# Patient Record
Sex: Female | Born: 1950 | Race: Black or African American | Hispanic: No | Marital: Married | Smoking: Never smoker
Health system: Southern US, Community
[De-identification: ages and names within clinical notes are randomized; demographics above are authoritative.]

## PROBLEM LIST (undated history)

## (undated) DIAGNOSIS — E11319 Type 2 diabetes mellitus with unspecified diabetic retinopathy without macular edema: Secondary | ICD-10-CM

## (undated) DIAGNOSIS — H269 Unspecified cataract: Secondary | ICD-10-CM

## (undated) DIAGNOSIS — H35039 Hypertensive retinopathy, unspecified eye: Secondary | ICD-10-CM

## (undated) DIAGNOSIS — R251 Tremor, unspecified: Secondary | ICD-10-CM

## (undated) DIAGNOSIS — E785 Hyperlipidemia, unspecified: Secondary | ICD-10-CM

## (undated) DIAGNOSIS — F419 Anxiety disorder, unspecified: Secondary | ICD-10-CM

## (undated) DIAGNOSIS — E119 Type 2 diabetes mellitus without complications: Secondary | ICD-10-CM

## (undated) DIAGNOSIS — I10 Essential (primary) hypertension: Secondary | ICD-10-CM

## (undated) HISTORY — DX: Unspecified cataract: H26.9

## (undated) HISTORY — DX: Hypertensive retinopathy, unspecified eye: H35.039

## (undated) HISTORY — DX: Type 2 diabetes mellitus without complications: E11.9

## (undated) HISTORY — DX: Type 2 diabetes mellitus with unspecified diabetic retinopathy without macular edema: E11.319

## (undated) HISTORY — PX: EYE SURGERY: SHX253

## (undated) HISTORY — DX: Hyperlipidemia, unspecified: E78.5

## (undated) HISTORY — DX: Essential (primary) hypertension: I10

---

## 2007-03-24 ENCOUNTER — Ambulatory Visit (HOSPITAL_COMMUNITY): Admission: RE | Admit: 2007-03-24 | Discharge: 2007-03-24 | Payer: Self-pay | Admitting: Family Medicine

## 2014-06-22 ENCOUNTER — Encounter (INDEPENDENT_AMBULATORY_CARE_PROVIDER_SITE_OTHER): Payer: Self-pay | Admitting: *Deleted

## 2015-05-25 ENCOUNTER — Other Ambulatory Visit (HOSPITAL_COMMUNITY): Payer: Self-pay | Admitting: Family Medicine

## 2015-05-25 DIAGNOSIS — Z1231 Encounter for screening mammogram for malignant neoplasm of breast: Secondary | ICD-10-CM

## 2017-04-17 ENCOUNTER — Encounter: Payer: Self-pay | Admitting: Family Medicine

## 2017-04-17 ENCOUNTER — Ambulatory Visit (INDEPENDENT_AMBULATORY_CARE_PROVIDER_SITE_OTHER): Payer: BLUE CROSS/BLUE SHIELD | Admitting: Family Medicine

## 2017-04-17 VITALS — BP 180/100 | HR 106 | Temp 98.2°F | Resp 16 | Ht 63.0 in | Wt 154.5 lb

## 2017-04-17 DIAGNOSIS — Z79899 Other long term (current) drug therapy: Secondary | ICD-10-CM | POA: Diagnosis not present

## 2017-04-17 DIAGNOSIS — E782 Mixed hyperlipidemia: Secondary | ICD-10-CM | POA: Diagnosis not present

## 2017-04-17 DIAGNOSIS — R251 Tremor, unspecified: Secondary | ICD-10-CM | POA: Diagnosis not present

## 2017-04-17 DIAGNOSIS — I10 Essential (primary) hypertension: Secondary | ICD-10-CM

## 2017-04-17 DIAGNOSIS — E119 Type 2 diabetes mellitus without complications: Secondary | ICD-10-CM | POA: Insufficient documentation

## 2017-04-17 DIAGNOSIS — E785 Hyperlipidemia, unspecified: Secondary | ICD-10-CM | POA: Insufficient documentation

## 2017-04-17 DIAGNOSIS — Z23 Encounter for immunization: Secondary | ICD-10-CM

## 2017-04-17 DIAGNOSIS — R5383 Other fatigue: Secondary | ICD-10-CM

## 2017-04-17 MED ORDER — METFORMIN HCL 500 MG PO TABS: 500.0000 mg | ORAL_TABLET | Freq: Two times a day (BID) | ORAL | 0 refills | Status: DC

## 2017-04-17 MED ORDER — SIMVASTATIN 40 MG PO TABS: 40.0000 mg | ORAL_TABLET | Freq: Every day | ORAL | 0 refills | Status: DC

## 2017-04-17 MED ORDER — HYDROCHLOROTHIAZIDE 25 MG PO TABS: 25.0000 mg | ORAL_TABLET | Freq: Every day | ORAL | 0 refills | Status: DC

## 2017-04-17 NOTE — Progress Notes (Signed)
Patient ID: Carolyn Hunter, female    DOB: [birth year], 66 y.o.   MRN: [id number]  Chief Complaint  Patient presents with  . Diabetes    patient guess at last a1c- roughly 6 months ago, with result of about 5.9  . Hypertension  . Hyperlipidemia  . Medication Refill    Allergies Patient has no allergy information on record.  Subjective:   Carolyn Hunter is a 66 y.o. female who presents to [facility] today.  HPI Here to establish care. Has been out of medications for about three weeks. Reports that even when BP is controlled that has white coat syndrome. Reports that gets very nervous at doctor and BP goes up. Energy is good most of the time but has periods of time where can get tired. . Mood is good. Reports that since was 66 years old has had a tremor and it is fine. Not changed. Needs refill. Reports that colonoscopy is up to date. Needs shots. No lesions on feet.    Diabetes  She presents for her initial diabetic visit. She has type 2 diabetes mellitus. No MedicAlert identification noted. Her disease course has been stable. There are no hypoglycemic associated symptoms. Pertinent negatives for hypoglycemia include no nervousness/anxiousness, speech difficulty or sweats. Pertinent negatives for diabetes include no blurred vision, no foot paresthesias, no foot ulcerations, no polydipsia and no polyphagia. There are no hypoglycemic complications. Symptoms are stable. There are no diabetic complications. Pertinent negatives for diabetic complications include no CVA, heart disease, nephropathy, peripheral neuropathy or retinopathy. Risk factors for coronary artery disease include diabetes mellitus, dyslipidemia, hypertension and post-menopausal. Current diabetic treatment includes oral agent (monotherapy). She is compliant with treatment most of the time (has been out of  medication for 3 weeks. ). Her weight is stable. She is following a generally healthy, diabetic, low  fat/cholesterol and low salt diet. Meal planning includes avoidance of concentrated sweets. She participates in exercise every other day. Her overall blood glucose range is 110-130 mg/dl. An ACE inhibitor/angiotensin II receptor blocker is not being taken. She does not see a podiatrist.Eye exam is not current.  Hypertension  This is a chronic problem. The current episode started more than 1 year ago. The problem has been waxing and waning since onset. The problem is uncontrolled. Pertinent negatives include no blurred vision, palpitations, peripheral edema, shortness of breath or sweats. There are no associated agents to hypertension. Past treatments include diuretics and lifestyle changes. The current treatment provides mild (ran out of medicaitons. ) improvement. There is no history of angina, kidney disease, CAD/MI, CVA, left ventricular hypertrophy or retinopathy. There is no history of chronic renal disease.  Hyperlipidemia  This is a chronic problem. The current episode started more than 1 year ago. The problem is controlled. Recent lipid tests were reviewed and are variable. She has no history of chronic renal disease or obesity. There are no known factors aggravating her hyperlipidemia. Pertinent negatives include no focal sensory loss, leg pain, myalgias or shortness of breath. Current antihyperlipidemic treatment includes diet change, exercise and statins. The current treatment provides moderate improvement of lipids. There are no compliance problems.     Past Medical History:  Diagnosis Date  . Diabetes mellitus without complication (HCC)   . Hyperlipidemia   . Hypertension     Past Surgical History:  Procedure Laterality Date  . CESAREAN SECTION     1988    Family History  Problem Relation Age of Onset  .  Cancer Mother        breast and colon cancer  . Diabetes Brother   . Diabetes Son   . Asthma Father        COPD  . Diabetes Son      Social History   Social History  .  Marital status: Married    Spouse name: N/A  . Number of children: N/A  . Years of education: N/A   Occupational History  . retired    Social History Main Topics  . Smoking status: Never Smoker  . Smokeless tobacco: Never Used  . Alcohol use No  . Drug use: No  . Sexual activity: Yes    Birth control/ protection: Post-menopausal     Comment: 66 years old   Other Topics Concern  . 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 Exercises 30 min a day, four days a week.    Enjoys activities. Does not attend church. Watch television.     No outpatient prescriptions prior to visit.   No facility-administered medications prior to visit.     Review of Systems  Constitutional: Negative for activity change and appetite change.  Eyes: Negative for blurred vision.  Respiratory: Negative for choking, chest tightness and shortness of breath.   Cardiovascular: Negative for palpitations and leg swelling.  Endocrine: Negative for polydipsia and polyphagia.  Musculoskeletal: Negative for myalgias.  Neurological: Negative for speech difficulty.  Psychiatric/Behavioral: The patient is not nervous/anxious.      Objective:   BP (!) 180/100 (BP Location: Left Arm, Patient Position: Sitting, Cuff Size: Normal)   Pulse (!) 106   Temp 98.2 F (36.8 C) (Other (Comment))   Resp 16   Ht 5\' 3"  (1.6 m)   Wt 154 lb 8 oz (70.1 kg)   SpO2 98%   BMI 27.37 kg/m   Physical Exam  Constitutional: She is oriented to person, place, and time. She appears well-developed and well-nourished.  HENT:  Head: Normocephalic and atraumatic.  Eyes: Pupils are equal, round, and reactive to light. EOM are normal.  Neck: Normal range of motion. Neck supple. No JVD present. No thyromegaly present.  Cardiovascular: Normal rate and regular rhythm.   No murmur heard. Pulses:      Dorsalis pedis pulses are 2+ on the right side, and 2+ on the left  side.       Posterior tibial pulses are 2+ on the right side, and 2+ on the left side.  Pulmonary/Chest: Effort normal and breath sounds normal. No respiratory distress. She has no wheezes.  Abdominal: Soft. Bowel sounds are normal. She exhibits no distension.  Musculoskeletal: She exhibits no edema.       Right foot: There is normal range of motion and no deformity.       Left foot: There is normal range of motion and no deformity.  Feet:  Right Foot:  Protective Sensation: 9 sites tested. 9 sites sensed.  Skin Integrity: Negative for ulcer, blister, skin breakdown, erythema, warmth or callus.  Left Foot:  Protective Sensation: 9 sites tested. 9 sites sensed.  Skin Integrity: Negative for ulcer, blister, skin breakdown, erythema, warmth or callus.  Lymphadenopathy:    She has no cervical adenopathy.  Neurological: She is alert and oriented to person, place, and time. She has normal strength. No cranial nerve deficit. Coordination and gait normal.  Mild tremor, resting  Psychiatric: She has a normal mood and affect.  Her speech is normal and behavior is normal.  Vitals reviewed.    Assessment and Plan   1. Need for immunization against influenza  - Flu Vaccine QUAD 36+ mos IM  2. Controlled type 2 diabetes mellitus without complication, without long-term current use of insulin (HCC)  - metFORMIN (GLUCOPHAGE) 500 MG tablet; Take 1 tablet (500 mg total) by mouth 2 (two) times daily with a meal.  Dispense: 180 tablet; Refill: 0 - Hemoglobin A1c - Urine Microalbumin w/creat. ratio - Ambulatory referral to Ophthalmology The patient is asked to make an attempt to improve diet and exercise patterns to aid in medical management of this problem. Patient counseled in detail regarding the risks of medication. Told to call or return to clinic if develop any worrisome signs or symptoms. Patient voiced understanding.    3. HTN, goal below 130/80 Discussed was high. Reports that Is usually  controlled when on medication. Will check BP at home and record and bring cuff into office so that we can verify that it is working. Counseled on way to check BP. Will wait until BP controlled more to determine need for ASA.  - hydrochlorothiazide (HYDRODIURIL) 25 MG tablet; Take 1 tablet (25 mg total) by mouth daily.  Dispense: 90 tablet; Refill: 0 - Basic metabolic panel  4. Mixed hyperlipidemia Patient counseled in detail regarding the risks of medication. Told to call or return to clinic if develop any worrisome signs or symptoms. Patient voiced understanding.  Diet recommendations discussed.  - simvastatin (ZOCOR) 40 MG tablet; Take 1 tablet (40 mg total) by mouth daily.  Dispense: 90 tablet; Refill: 0 - Lipid panel  5. Tremor Chronic. Will check TSH. Counseled if changes or worsens to follow up. Voiced understanding.   6. Fatigue, unspecified type Sporadic. Check labs.  - CBC with Differential/Platelet - TSH  7. High risk medications (not anticoagulants) long-term use  - Hepatic function panel  8. Immunization due  - Pneumococcal conjugate vaccine 13-valent        Aliene Beams, MD 04/17/2017

## 2017-04-17 NOTE — Patient Instructions (Signed)

## 2017-04-18 LAB — CBC WITH DIFFERENTIAL/PLATELET
Basophils Absolute: 51 cells/uL (ref 0–200)
Basophils Relative: 1 %
EOS PCT: 5.9 %
Eosinophils Absolute: 301 cells/uL (ref 15–500)
HCT: 41.1 % (ref 35.0–45.0)
Hemoglobin: 13.8 g/dL (ref 11.7–15.5)
Lymphs Abs: 1341 cells/uL (ref 850–3900)
MCH: 30 pg (ref 27.0–33.0)
MCHC: 33.6 g/dL (ref 32.0–36.0)
MCV: 89.3 fL (ref 80.0–100.0)
MPV: 11.2 fL (ref 7.5–12.5)
Monocytes Relative: 11.5 %
NEUTROS PCT: 55.3 %
Neutro Abs: 2820 cells/uL (ref 1500–7800)
PLATELETS: 224 10*3/uL (ref 140–400)
RBC: 4.6 10*6/uL (ref 3.80–5.10)
RDW: 11.9 % (ref 11.0–15.0)
TOTAL LYMPHOCYTE: 26.3 %
WBC mixed population: 587 cells/uL (ref 200–950)
WBC: 5.1 10*3/uL (ref 3.8–10.8)

## 2017-04-18 LAB — LIPID PANEL
Cholesterol: 349 mg/dL — ABNORMAL HIGH (ref <200)
HDL: 57 mg/dL (ref >50)
LDL Cholesterol (Calc): 250 mg/dL (calc) — ABNORMAL HIGH
Non-HDL Cholesterol (Calc): 292 mg/dL (calc) — ABNORMAL HIGH (ref <130)
TRIGLYCERIDES: 229 mg/dL — AB (ref <150)
Total CHOL/HDL Ratio: 6.1 (calc) — ABNORMAL HIGH (ref <5.0)

## 2017-04-18 LAB — MICROALBUMIN / CREATININE URINE RATIO
Creatinine, Urine: 57 mg/dL (ref 20–320)
MICROALB UR: 2.7 mg/dL
Microalb Creat Ratio: 47 mcg/mg creat — ABNORMAL HIGH (ref <30)

## 2017-04-18 LAB — HEPATIC FUNCTION PANEL
AG Ratio: 1.2 (calc) (ref 1.0–2.5)
ALBUMIN MSPROF: 4.1 g/dL (ref 3.6–5.1)
ALT: 10 U/L (ref 6–29)
AST: 15 U/L (ref 10–35)
Alkaline phosphatase (APISO): 76 U/L (ref 33–130)
BILIRUBIN DIRECT: 0.1 mg/dL (ref 0.0–0.2)
BILIRUBIN TOTAL: 0.5 mg/dL (ref 0.2–1.2)
GLOBULIN: 3.4 g/dL (ref 1.9–3.7)
Indirect Bilirubin: 0.4 mg/dL (calc) (ref 0.2–1.2)
Total Protein: 7.5 g/dL (ref 6.1–8.1)

## 2017-04-18 LAB — BASIC METABOLIC PANEL WITH GFR
BUN: 13 mg/dL (ref 7–25)
CALCIUM: 10.2 mg/dL (ref 8.6–10.4)
CO2: 31 mmol/L (ref 20–32)
CREATININE: 0.7 mg/dL (ref 0.50–0.99)
Chloride: 100 mmol/L (ref 98–110)
GFR, Est African American: 107 mL/min/{1.73_m2} (ref >=60)
GFR, Est Non African American: 92 mL/min/{1.73_m2} (ref >=60)
GLUCOSE: 213 mg/dL — AB (ref 65–99)
Potassium: 4.3 mmol/L (ref 3.5–5.3)
Sodium: 138 mmol/L (ref 135–146)

## 2017-04-18 LAB — HEMOGLOBIN A1C
HEMOGLOBIN A1C: 7.1 %{Hb} — AB (ref <5.7)
MEAN PLASMA GLUCOSE: 157 (calc)
eAG (mmol/L): 8.7 (calc)

## 2017-04-18 LAB — TSH: TSH: 1.85 m[IU]/L (ref 0.40–4.50)

## 2017-04-21 ENCOUNTER — Encounter: Payer: Self-pay | Admitting: Family Medicine

## 2017-04-29 NOTE — Progress Notes (Signed)
Patient ID: Carolyn Hunter, female    DOB: 1951-07-21, 66 y.o.   MRN: [id number]  Chief Complaint  Patient presents with  . Diabetes  . Hypertension  . Hyperlipidemia    Allergies Patient has no allergy information on record.  Subjective:   Carolyn Hunter is a 66 y.o. female who presents to [facility] today.  HPI Here to discuss labs. Reports that has been checking BP at home and it runs 130s/80s. Brings in machine to show me the readings and for me to check the cuff and compare the reading with our machine.  Has been on HCTZ for BP. Does not have any side effects with it. Since was seen here has started back on zocor for cholesterol. Eats a healthy diet and tries to eat fruits, vegetables. Does not eat fried foods. Walks and exercises. Has been taking glucophage for blood sugars. Does not check sugars at home. No hypoglycemic episodes. Vision good. No lesion on feet.     Past Medical History:  Diagnosis Date  . Diabetes mellitus without complication (HCC)   . Hyperlipidemia   . Hypertension     Past Surgical History:  Procedure Laterality Date  . CESAREAN SECTION     1988    Family History  Problem Relation Age of Onset  . Cancer Mother        breast and colon cancer  . Diabetes Brother   . Diabetes Son   . Asthma Father        COPD  . Diabetes Son      Social History   Social History  . Marital status: Married    Spouse name: N/A  . Number of children: N/A  . Years of education: N/A   Occupational History  . retired    Social History Main Topics  . Smoking status: Never Smoker  . Smokeless tobacco: Never Used  . Alcohol use No  . Drug use: No  . Sexual activity: Yes    Birth control/ protection: Post-menopausal     Comment: 66 years old   Other Topics Concern  . 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 Exercises 30 min a day, four days a week.    Enjoys  activities. Does not attend church. Watch television.     Review of Systems  Constitutional: Negative for activity change, appetite change, chills, fatigue, fever and unexpected weight change.  Eyes: Negative for visual disturbance.  Respiratory: Negative for cough, chest tightness and shortness of breath.   Cardiovascular: Negative for chest pain, palpitations and leg swelling.  Gastrointestinal: Negative for abdominal pain, nausea and vomiting.  Endocrine: Negative for polyphagia and polyuria.  Genitourinary: Negative for dysuria, frequency and urgency.  Skin: Negative for rash and wound.  Neurological: Negative for dizziness, syncope and light-headedness.  Hematological: Negative for adenopathy.     Objective:   BP (!) 184/78 (BP Location: Left Arm, Patient Position: Sitting, Cuff Size: Normal)   Pulse 84   Temp (!) 97.4 F (36.3 C) (Other (Comment))   Resp 16   Ht  (1.6 m)   Wt 154 lb 4 oz (70 kg)   SpO2 97%   BMI 27.32 kg/m   Physical Exam  Constitutional: She is oriented to person, place, and time. She appears well-developed and well-nourished. No distress.  HENT:  Head: Normocephalic and atraumatic.  Eyes: Pupils are equal, round, and reactive  to light.  Neck: Normal range of motion. Neck supple. No thyromegaly present.  Cardiovascular: Normal rate, regular rhythm and normal heart sounds.   Pulmonary/Chest: Effort normal and breath sounds normal. No respiratory distress.  Neurological: She is alert and oriented to person, place, and time. No cranial nerve deficit.  Skin: Skin is warm and dry.  Nursing note and vitals reviewed.    Assessment and Plan   1. Controlled type 2 diabetes mellitus without complication, without long-term current use of insulin (HCC) Discussed with patient and will increase the metformin to get A1c less than 7. Patient counseled in detail regarding the risks of medication. Told to call or return to clinic if develop any worrisome  signs or symptoms. Patient voiced understanding.  Diet, exericse, foot care discussed.  Due to elevated microalbumin ratio, will change to ACE inhibitor.  Spent over 25 minutes discussing BP and ACE-inhibitor possible side effects, risk, benefits with patient. Discussed renal protective effects of medication and need for this in diabetic. Agree to call if problem and will check K and Cr at follow up.  - metFORMIN (GLUCOPHAGE) 500 MG tablet; Take 2 pills each morning and 1 pill each evening.  Dispense: 270 tablet; Refill: 0  2. Mixed hyperlipidemia Check FLP in 2 months and liver tests. Mediterranean diet discussed. Discussed goal of LDL less than 70.  - simvastatin (ZOCOR) 40 MG tablet; Take 1 tablet (40 mg total) by mouth at bedtime.  Dispense: 90 tablet; Refill: 1  3. HTN, goal below 130/80 Discontinue the HCTZ. Patient with white coat hypertension. BP at home runs 130s/80s. She brought in her machine today and it was consistent with our machine. Readings at home are good. Need to change BP medication due to compelling indication for ACE-I for renal protection. Patient counseled in detail regarding the risks of medication. Told to call or return to clinic if develop any worrisome signs or symptoms. Patient voiced understanding.  Lifestyle modifications discussed with patient including a diet emphasizing vegetables, fruits, and whole grains. Limiting intake of sodium to less than 2,400 mg per day.  Recommendations discussed include consuming low-fat dairy products, poultry, fish, legumes, non-tropical vegetable oils, and nuts; and limiting intake of sweets, sugar-sweetened beverages, and red meat. Discussed following a plan such as the Dietary Approaches to Stop Hypertension (DASH) diet. Patient to read up on this diet.   - lisinopril (PRINIVIL,ZESTRIL) 10 MG tablet; Take 1 tablet (10 mg total) by mouth daily.  Dispense: 90 tablet; Refill: 0   At follow up. Calculate ASA risk/benefit and 10 year  risk ASCVD. Over 50% of OV spend counseling and coordinate card.  Patient will call with BP readings at home and if elevated will increase the lisinipril prior to follow up.  Return in about 8 weeks (around 07/02/2017) for BP/cholesteorl. Aliene Beams, MD 05/08/2017

## 2017-04-30 ENCOUNTER — Ambulatory Visit: Payer: BLUE CROSS/BLUE SHIELD | Admitting: Family Medicine

## 2017-05-07 ENCOUNTER — Encounter: Payer: Self-pay | Admitting: Family Medicine

## 2017-05-07 ENCOUNTER — Ambulatory Visit (INDEPENDENT_AMBULATORY_CARE_PROVIDER_SITE_OTHER): Payer: BLUE CROSS/BLUE SHIELD | Admitting: Family Medicine

## 2017-05-07 VITALS — BP 184/78 | HR 84 | Temp 97.4°F | Resp 16 | Ht 63.0 in | Wt 154.2 lb

## 2017-05-07 DIAGNOSIS — E119 Type 2 diabetes mellitus without complications: Secondary | ICD-10-CM | POA: Diagnosis not present

## 2017-05-07 DIAGNOSIS — E782 Mixed hyperlipidemia: Secondary | ICD-10-CM | POA: Diagnosis not present

## 2017-05-07 DIAGNOSIS — I10 Essential (primary) hypertension: Secondary | ICD-10-CM

## 2017-05-07 MED ORDER — SIMVASTATIN 40 MG PO TABS: 40.0000 mg | ORAL_TABLET | Freq: Every day | ORAL | 1 refills | Status: DC

## 2017-05-07 MED ORDER — LISINOPRIL 10 MG PO TABS: 10.0000 mg | ORAL_TABLET | Freq: Every day | ORAL | 0 refills | Status: DC

## 2017-05-07 MED ORDER — METFORMIN HCL 500 MG PO TABS: ORAL_TABLET | ORAL | 0 refills | Status: DC

## 2017-06-05 NOTE — Progress Notes (Signed)
[facility] - Clinic Note  06/09/2017     CHIEF COMPLAINT Patient presents for Retina Evaluation and Diabetes   HISTORY OF PRESENT ILLNESS: Carolyn Hunter is a 66 y.o. female who presents to the clinic today for:   HPI    Retina Evaluation  In both eyes.  Associated Symptoms Negative for Flashes, Blind Spot, Floaters, Pain, Distortion, Redness, Trauma, Shoulder/Hip pain, Fatigue, Weight Loss, Jaw Claudication, Glare, Photophobia, Scalp Tenderness and Fever.  Context:  mid-range vision, near vision and distance vision.  Treatments tried include no treatments.  I, the attending physician,  performed the HPI with the patient and updated documentation appropriately.        Comments  Referral of Dr. Charise Killianotter Diabtic eye  Eval. Patient states she has cataracts OU and needs to have eye exam before having them removed.She denies having Floaters, flash, pain. CBG 112 this am last  A1c 7. She denies using eye gtts. Denies taking Vits .     Pt states she was dx with DM x 5 years ago; Pt states DM is stable, reports she is on oral medications; Reports CBG is stable; Pt states that she "very seldom" has CBG over 200; Pt states only other health issues are HTN and chol.      Last edited by Rennis ChrisZamora, Sheli Dorin, MD on 06/09/2017  4:56 PM. (History)      Referring physician: Daisy Lazarotter, Mark, DO [address] [place], [place] [id number]  HISTORICAL INFORMATION:   Selected notes from the MEDICAL RECORD NUMBER Referral from Dr. Fabienne BrunsM. Cotter for DM exam;  Ocular Hx-  PMH- DM; high chol; HTN; Last A1C- 7.1 (9.7.18);    CURRENT MEDICATIONS: No current outpatient prescriptions on file. (Ophthalmic Drugs)   No current facility-administered medications for this visit.  (Ophthalmic Drugs)   Current Outpatient Prescriptions (Other)  Medication Sig  . lisinopril (PRINIVIL,ZESTRIL) 10 MG tablet Take 1 tablet (10 mg total) by mouth daily.  . metFORMIN (GLUCOPHAGE) 500 MG tablet Take 2 pills each  morning and 1 pill each evening.  . simvastatin (ZOCOR) 40 MG tablet Take 1 tablet (40 mg total) by mouth at bedtime.  . hydrochlorothiazide (HYDRODIURIL) 25 MG tablet    No current facility-administered medications for this visit.  (Other)      REVIEW OF SYSTEMS: ROS    Positive for: Neurological, Endocrine, Eyes   Negative for: Constitutional, Gastrointestinal, Skin, Genitourinary, Musculoskeletal, HENT, Cardiovascular, Respiratory, Psychiatric, Allergic/Imm, Heme/Lymph   Last edited by Eldridge ScotKendrick, Glenda, LPN on 60/45/409810/30/2018  1:41 PM. (History)       ALLERGIES No Known Allergies  PAST MEDICAL HISTORY Past Medical History:  Diagnosis Date  . Cataract   . Diabetes mellitus without complication (HCC)   . Hyperlipidemia   . Hypertension    Past Surgical History:  Procedure Laterality Date  . CESAREAN SECTION     1988    FAMILY HISTORY Family History  Problem Relation Age of Onset  . Cancer Mother        breast and colon cancer  . Diabetes Brother   . Diabetes Son   . Asthma Father        COPD  . Diabetes Son     SOCIAL HISTORY Social History  Substance Use Topics  . Smoking status: Never Smoker  . Smokeless tobacco: Never Used  . Alcohol use No         OPHTHALMIC EXAM:  Base Eye Exam    Visual Acuity (Snellen - Linear)  Right Left   Dist cc 20/25 -1 20/40 -2   Dist ph cc NI NI   Correction:  Glasses       Tonometry (Tonopen, 1:57 PM)      Right Left   Pressure 19 19       Pupils      Dark Light Shape React APD   Right 4 3.5 Round 1 None   Left 4 3.5 Round 1 None       Visual Fields (Counting fingers)      Left Right    Full Full       Extraocular Movement      Right Left    Full, Ortho Full, Ortho       Neuro/Psych    Oriented x3:  Yes       Dilation    Both eyes:  1.0% Mydriacyl, 2.5% Phenylephrine @ 1:57 PM        Slit Lamp and Fundus Exam    Slit Lamp Exam      Right Left   Lids/Lashes Dermatochalasis - upper lid,  Meibomian gland dysfunction Dermatochalasis - upper lid, Meibomian gland dysfunction   Conjunctiva/Sclera White and quiet White and quiet   Cornea Mild Arcus Mild Arcus   Anterior Chamber Deep and quiet Deep and quiet   Iris Round and dilated, No NVI Round and dilated, No NVI   Lens 2+ Nuclear sclerosis, 2+ Cortical cataract 2+ Nuclear sclerosis, 2+ Cortical cataract   Vitreous Normal Normal       Fundus Exam      Right Left   Disc Normal, No NVD Normal, No NVD   C/D Ratio 0.4 0.4   Macula blunted foveal reflex, perifoveal exudates, MAs, exudates infro temportal to fovea blunted foveal relfex, exudates temporal to fovea, MAs   Vessels Tortuous, AV crossing changes Tortuous with early veinous beading    Periphery Attached, scattered MAs, DBH, exudates inferior to disc attached, CWS nasally, DBH, exudates        Refraction    Wearing Rx      Sphere Cylinder Axis Add   Right [place] +0.50 142 +2.50   Left -0.75 +0.50 035 +2.50   Age:  7   Type:  PAL       Manifest Refraction (Auto)      Sphere Cylinder Axis Dist VA   Right +0.50 +0.50 165 20/30-2   Left -0.75 +0.50 040 20/30-2          IMAGING AND PROCEDURES  Imaging and Procedures for 06/09/17  OCT, Retina - OU - Both Eyes     Right Eye Quality was good. Central Foveal Thickness: 380. Progression has no prior data. Findings include abnormal foveal contour, intraretinal hyper-reflective material, no SRF, intraretinal fluid.   Left Eye Quality was good. Central Foveal Thickness: 366. Progression has no prior data. Findings include abnormal foveal contour, intraretinal fluid, intraretinal hyper-reflective material, no SRF.   Notes Images taken, stored on drive  Diagnosis / Impression:  DME OU (OD>OS)  Clinical management:  See below  Abbreviations: NFP - Normal foveal profile. CME - cystoid macular edema. PED - pigment epithelial detachment. IRF - intraretinal fluid. SRF - subretinal fluid. EZ - ellipsoid zone. ERM -  epiretinal membrane. ORA - outer retinal atrophy. ORT - outer retinal tubulation. SRHM - subretinal hyper-reflective material       Fluorescein Angiography [place] (Transit OD)     Right Eye Progression has no prior data. Early phase findings include microaneurysm, blockage.  Mid/Late phase findings include microaneurysm, leakage, blockage.   Left Eye Progression has no prior data. Early phase findings include blockage, microaneurysm. Mid/Late phase findings include microaneurysm, leakage, blockage.               ASSESSMENT/PLAN:    ICD-10-CM   1. Moderate nonproliferative diabetic retinopathy of both eyes with macular edema associated with type 2 diabetes mellitus (HCC) E11.3313 OCT, Retina - OU - Both Eyes    Fluorescein Angiography [place] (Transit OD)  2. Hypertensive retinopathy of both eyes H35.033   3. Combined forms of age-related cataract of both eyes H25.813     1. moderate Non-Proliferative diabetic retinopathy, both eyes The incidence, risk factors for progression, natural history and treatment options for diabetic retinopathy  were discussed with patient.  The need for close monitoring of blood glucose, blood pressure, and serum lipids, avoiding cigarette or any type of tobacco, and the need for long term follow up was also discussed with patient. - no NV on exam or fluorescein angiogram - Diabetic macular edema, both eyes  The natural history, pathology, and characteristics of diabetic macular edema discussed with patient.  A generalized discussion of the major clinical trials concerning treatment of diabetic macular edema (ETDRS, DCT, SCORE, RISE / RIDE, and ongoing DRCR net studies) was completed.  This discussion included mention of the various approaches to treating diabetic macular edema (observation, laser photocoagulation, anti-VEGF injections with lucentis / Avastin / Eylea, steroid injections with Kenalog / Ozurdex, and intraocular surgery with vitrectomy).   The goal hemoglobin A1C of 6-7 was discussed, as well as importance of smoking cessation and hypertension control.  Need for ongoing treatment and monitoring were specifically discussed with reference to chronic nature of diabetic macular edema.  - relatively mild DME/cystic changes on OCT and BCVA good at 20/30 OU - discussed findings, prognosis, and possible treatments - recommend monitoring for now - f/u in 2 mos with repeat DFE/OCT  2. Hypertensive retinopathy OU - discussed importance of tight BP control - monitor  3. Nuclear and Cortical Cataracts OU;  - The symptoms of cataract, surgical options, and treatments and risks were discussed with patient. - discussed diagnosis and progression - not yet visually significant - monitor for now   Ophthalmic Meds Ordered this visit:  No orders of the defined types were placed in this encounter.      Return for F/U DME OU.  There are no Patient Instructions on file for this visit.   Explained the diagnoses, plan, and follow up with the patient and they expressed understanding.  Patient expressed understanding of the importance of proper follow up care.   Karie Chimera, M.D., Ph.D. Diseases & Surgery of the Retina and Vitreous [facility] 06/09/17     Abbreviations: M myopia (nearsighted); A astigmatism; H hyperopia (farsighted); P presbyopia; Mrx spectacle prescription;  CTL contact lenses; OD right eye; OS left eye; OU both eyes  XT exotropia; ET esotropia; PEK punctate epithelial keratitis; PEE punctate epithelial erosions; DES dry eye syndrome; MGD meibomian gland dysfunction; ATs artificial tears; PFAT's preservative free artificial tears; NSC nuclear sclerotic cataract; PSC posterior subcapsular cataract; ERM epi-retinal membrane; PVD posterior vitreous detachment; RD retinal detachment; DM diabetes mellitus; DR diabetic retinopathy; NPDR non-proliferative diabetic retinopathy; PDR proliferative  diabetic retinopathy; CSME clinically significant macular edema; DME diabetic macular edema; dbh dot blot hemorrhages; CWS cotton wool spot; POAG primary open angle glaucoma; C/D cup-to-disc ratio; HVF humphrey visual field; GVF goldmann visual field;  OCT optical coherence tomography; IOP intraocular pressure; BRVO Branch retinal vein occlusion; CRVO central retinal vein occlusion; CRAO central retinal artery occlusion; BRAO branch retinal artery occlusion; RT retinal tear; SB scleral buckle; PPV pars plana vitrectomy; VH Vitreous hemorrhage; PRP panretinal laser photocoagulation; IVK intravitreal kenalog; VMT vitreomacular traction; MH Macular hole;  NVD neovascularization of the disc; NVE neovascularization elsewhere; AREDS age related eye disease study; ARMD age related macular degeneration; POAG primary open angle glaucoma; EBMD epithelial/anterior basement membrane dystrophy; ACIOL anterior chamber intraocular lens; IOL intraocular lens; PCIOL posterior chamber intraocular lens; Phaco/IOL phacoemulsification with intraocular lens placement; [place] photorefractive keratectomy; LASIK laser assisted in situ keratomileusis; HTN hypertension; DM diabetes mellitus; COPD chronic obstructive pulmonary disease

## 2017-06-09 ENCOUNTER — Encounter (INDEPENDENT_AMBULATORY_CARE_PROVIDER_SITE_OTHER): Payer: Self-pay | Admitting: Ophthalmology

## 2017-06-09 ENCOUNTER — Ambulatory Visit (INDEPENDENT_AMBULATORY_CARE_PROVIDER_SITE_OTHER): Payer: BLUE CROSS/BLUE SHIELD | Admitting: Ophthalmology

## 2017-06-09 DIAGNOSIS — H25813 Combined forms of age-related cataract, bilateral: Secondary | ICD-10-CM

## 2017-06-09 DIAGNOSIS — E113313 Type 2 diabetes mellitus with moderate nonproliferative diabetic retinopathy with macular edema, bilateral: Secondary | ICD-10-CM | POA: Diagnosis not present

## 2017-06-09 DIAGNOSIS — H35033 Hypertensive retinopathy, bilateral: Secondary | ICD-10-CM | POA: Diagnosis not present

## 2017-06-15 ENCOUNTER — Other Ambulatory Visit: Payer: Self-pay | Admitting: Family Medicine

## 2017-06-15 DIAGNOSIS — E119 Type 2 diabetes mellitus without complications: Secondary | ICD-10-CM

## 2017-06-18 ENCOUNTER — Other Ambulatory Visit: Payer: Self-pay | Admitting: Family Medicine

## 2017-06-18 ENCOUNTER — Telehealth: Payer: Self-pay | Admitting: *Deleted

## 2017-06-18 DIAGNOSIS — E119 Type 2 diabetes mellitus without complications: Secondary | ICD-10-CM

## 2017-06-18 NOTE — Telephone Encounter (Signed)
Medication was e-scribed earlier this morning.

## 2017-06-18 NOTE — Telephone Encounter (Signed)
Patient called stating she is taking metformin 3 tablets daily in a 90 day supply, and she needs a refill.Please advise [phone]

## 2017-06-23 ENCOUNTER — Other Ambulatory Visit: Payer: Self-pay

## 2017-06-23 DIAGNOSIS — E119 Type 2 diabetes mellitus without complications: Secondary | ICD-10-CM

## 2017-06-23 MED ORDER — METFORMIN HCL 500 MG PO TABS: ORAL_TABLET | ORAL | 0 refills | Status: DC

## 2017-06-26 ENCOUNTER — Telehealth: Payer: Self-pay | Admitting: Family Medicine

## 2017-06-26 NOTE — Telephone Encounter (Signed)
Called patient 2x to  Confirm her appt per her request by telephone message. No answer, phone only rang 1 time and hung up. Did this twice.

## 2017-06-30 ENCOUNTER — Telehealth: Payer: Self-pay | Admitting: Family Medicine

## 2017-06-30 NOTE — Telephone Encounter (Signed)
Called patient back twice, rings 1 time then makes a hang up noise, no voicemail, no answer. This has happened more than 1 time

## 2017-07-07 ENCOUNTER — Encounter: Payer: Self-pay | Admitting: Family Medicine

## 2017-07-07 ENCOUNTER — Ambulatory Visit: Payer: BLUE CROSS/BLUE SHIELD | Admitting: Family Medicine

## 2017-07-07 ENCOUNTER — Other Ambulatory Visit: Payer: Self-pay

## 2017-07-07 VITALS — BP 162/90 | HR 97 | Temp 98.3°F | Resp 16 | Ht 63.0 in | Wt 154.5 lb

## 2017-07-07 DIAGNOSIS — Z1159 Encounter for screening for other viral diseases: Secondary | ICD-10-CM

## 2017-07-07 DIAGNOSIS — E782 Mixed hyperlipidemia: Secondary | ICD-10-CM

## 2017-07-07 DIAGNOSIS — Z1231 Encounter for screening mammogram for malignant neoplasm of breast: Secondary | ICD-10-CM

## 2017-07-07 DIAGNOSIS — Z1239 Encounter for other screening for malignant neoplasm of breast: Secondary | ICD-10-CM

## 2017-07-07 DIAGNOSIS — E119 Type 2 diabetes mellitus without complications: Secondary | ICD-10-CM | POA: Diagnosis not present

## 2017-07-07 DIAGNOSIS — I10 Essential (primary) hypertension: Secondary | ICD-10-CM | POA: Diagnosis not present

## 2017-07-07 DIAGNOSIS — Z79899 Other long term (current) drug therapy: Secondary | ICD-10-CM

## 2017-07-07 MED ORDER — ASPIRIN EC 81 MG PO TBEC: 81.0000 mg | DELAYED_RELEASE_TABLET | Freq: Every day | ORAL | Status: DC

## 2017-07-07 MED ORDER — METFORMIN HCL 850 MG PO TABS: 850.0000 mg | ORAL_TABLET | Freq: Two times a day (BID) | ORAL | 1 refills | Status: DC

## 2017-07-07 NOTE — Progress Notes (Signed)
Patient ID: Carolyn Hunter, female    DOB: [birth year], 66 y.o.   MRN: [id number]  Chief Complaint  Patient presents with  . Hypertension  . Follow-up    Allergies Patient has no known allergies.  Subjective:   Carolyn Hunter is a 66 y.o. female who [facility].  HPI Carolyn Hunter presents today for follow-up for her blood pressure.  Brings in her readings from home.  Her reading at home before she came in today was 120/76.  Reports she has been taking lisinopril each day as directed.  Is not been taking the HCTZ.  Denies any side effects with the medication.  Reports that she had initially wanted to be on 2 metformin in the morning and 1 in the evening rather than doing the 875 mg twice a day.  Reports she has changed her mind and would like to switch to the twice a day medication dosing with less number of pills.  Reports she is feeling good.  Eating a healthy diet.  No swelling in her extremities.  No lesions on her feet. Is agreeable and would like to be checked for hepatitis C for screening.  Does not want to be checked for HIV.  Has not had a mammogram in many years. Diabetic foot exam was performed at her last visit.  Reports she is taking good care of her feet.  Has been taking her cholesterol medicine for her elevated LDL.  Denies any myalgias or pain.  Does not check her blood sugars at home.   Diabetes  She has type 2 diabetes mellitus. No MedicAlert identification noted. Her disease course has been stable. There are no hypoglycemic associated symptoms. Pertinent negatives for hypoglycemia include no headaches or nervousness/anxiousness. There are no diabetic associated symptoms. Pertinent negatives for diabetes include no blurred vision, no chest pain, no fatigue, no foot paresthesias, no foot ulcerations, no polydipsia, no polyphagia, no polyuria, no visual change, no weakness and no weight loss. There are no hypoglycemic complications. Symptoms are  stable. There are no diabetic complications. Risk factors for coronary artery disease include dyslipidemia, family history and hypertension. Current diabetic treatment includes oral agent (monotherapy). Her weight is stable. She is following a diabetic, high fiber, generally healthy, low salt and low fat/cholesterol diet. Meal planning includes avoidance of concentrated sweets. She has not had a previous visit with a dietitian. She participates in exercise every other day. An ACE inhibitor/angiotensin II receptor blocker is being taken. She does not see a podiatrist.Eye exam is current.    Past Medical History:  Diagnosis Date  . Cataract   . Diabetes mellitus without complication (HCC)   . Hyperlipidemia   . Hypertension     Past Surgical History:  Procedure Laterality Date  . CESAREAN SECTION     1988    Family History  Problem Relation Age of Onset  . Cancer Mother        breast and colon cancer  . Diabetes Brother   . Diabetes Son   . Asthma Father        COPD  . Diabetes Son      Social History   Socioeconomic History  . Marital status: Married    Spouse name: None  . Number of children: None  . Years of education: None  . Highest education level: None  Social Needs  . Financial resource strain: None  . Food insecurity - worry: None  . Food insecurity - inability:  None  . Transportation needs - medical: None  . Transportation needs - non-medical: None  Occupational History  . Occupation: retired  Tobacco Use  . Smoking status: Never Smoker  . Smokeless tobacco: Never Used  Substance and Sexual Activity  . Alcohol use: No  . Drug use: No  . Sexual activity: Yes    Birth control/protection: Post-menopausal    Comment: 66 years old  Other Topics Concern  . 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 Eats all food groups. Exercises 30 min a day, four days a week.    Enjoys activities. Does not attend church. Watch  television.    Current Outpatient Medications on File Prior to Visit  Medication Sig Dispense Refill  . lisinopril (PRINIVIL,ZESTRIL) 10 MG tablet Take 1 tablet (10 mg total) by mouth daily. 90 tablet 0  . simvastatin (ZOCOR) 40 MG tablet Take 1 tablet (40 mg total) by mouth at bedtime. 90 tablet 1   No current facility-administered medications on file prior to visit.     Review of Systems  Constitutional: Negative for fatigue, fever, unexpected weight change and weight loss.  Eyes: Negative for blurred vision and visual disturbance.  Respiratory: Negative for cough, shortness of breath and wheezing.   Cardiovascular: Negative for chest pain, palpitations and leg swelling.  Gastrointestinal: Negative for nausea.  Endocrine: Negative for polydipsia, polyphagia and polyuria.  Genitourinary: Negative for difficulty urinating and urgency.  Musculoskeletal: Negative for arthralgias and myalgias.  Neurological: Negative for weakness, light-headedness and headaches.  Hematological: Negative for adenopathy. Does not bruise/bleed easily.  Psychiatric/Behavioral: The patient is not nervous/anxious.      Objective:   BP (!) 162/90 (BP Location: Left Arm, Patient Position: Sitting, Cuff Size: Normal)   Pulse 97   Temp 98.3 F (36.8 C) (Other (Comment))   Resp 16   Ht 5\' 3"  (1.6 m)   Wt 154 lb 8 oz (70.1 kg)   SpO2 97%   BMI 27.37 kg/m   Physical Exam  Constitutional: She is oriented to person, place, and time. She appears well-developed and well-nourished. No distress.  HENT:  Head: Normocephalic and atraumatic.  Eyes: Pupils are equal, round, and reactive to light.  Neck: Normal range of motion. Neck supple. No thyromegaly present.  Cardiovascular: Normal rate, regular rhythm and normal heart sounds.  Pulmonary/Chest: Effort normal and breath sounds normal. No respiratory distress.  Neurological: She is alert and oriented to person, place, and time. No cranial nerve deficit.    Skin: Skin is warm and dry.  Nursing note and vitals reviewed.    Assessment and Plan  1. Controlled type 2 diabetes mellitus without complication, without long-term current use of insulin (HCC) We will switch dosing of metformin at this time.  We will plan on checking her A1c in 3 months.  Diabetic foot exam up-to-date.  Eye exam as directed. - metFORMIN (GLUCOPHAGE) 850 MG tablet; Take 1 tablet (850 mg total) by mouth 2 (two) times daily with a meal.  Dispense: 180 tablet; Refill: 1 - Hemoglobin A1c  2. Mixed hyperlipidemia Ordered lab test so that she will, get labs prior to her visit.  We will make sure that her LDL is at goal and increase her statin as needed. - Lipid panel  3. HTN, goal below 130/80 Blood pressure stable and controlled at home.  Patient does have whitecoat hypertension which exacerbates her blood pressure when she is at the doctor.  I have  checked her monitor and it is reading normal. - aspirin EC 81 MG tablet; Take 1 tablet (81 mg total) by mouth daily. Continue lisinopril as directed. 4. High risk medication use Check labs secondary to ACE inhibitor and statin use. - Basic metabolic panel - Hepatic function panel  5. Screening for breast cancer Patient will return to clinic at next visit for complete physical exam including Pap, pelvic and clinical breast exam.  Self breast exam monthly was recommended. - MM Digital Screening; Future  6. Encounter for hepatitis C screening test for low risk patient Discussed and ordered.  Patient defers HIV testing. - Hepatitis C Antibody Patient will call with any questions or concerns.  Continue healthy lifestyle diet and exercise. Return in about 6 months (around 01/04/2018) for CPE. Aliene Beams, MD 07/07/2017

## 2017-07-09 ENCOUNTER — Ambulatory Visit: Payer: BLUE CROSS/BLUE SHIELD | Admitting: Family Medicine

## 2017-07-24 NOTE — Progress Notes (Signed)
[facility] - Clinic Note  07/27/2017     CHIEF COMPLAINT Patient presents for Retina Follow Up   HISTORY OF PRESENT ILLNESS: Carolyn Hunter is a 66 y.o. female who presents to the clinic today for:   HPI    Retina Follow Up    Patient presents with  Diabetic Retinopathy.  In both eyes.  Severity is moderate.  Duration of 2 months.  Since onset it is stable.  I, the attending physician,  performed the HPI with the patient and updated documentation appropriately.          Comments    Pt presents today for F/U of NPDR, pt states vision is stable since last visit, she denies flashes, floaters, wavy vision or ocular pain, pt states DM is stable since last visit, last BS was 109 this AM, pt denies using gtts       Last edited by Rennis ChrisZamora, Azaan Leask, MD on 07/27/2017  1:11 PM. (History)    Pt reports no change in OU VA since being seen last; Pt reports that CBG and BP has been stable since being seen last;   Referring physician: Daisy Lazarotter, Mark, DO [address] [place], [place] [id number]  HISTORICAL INFORMATION:   Selected notes from the MEDICAL RECORD NUMBER Referral from Dr. Fabienne BrunsM. Cotter for DM exam;  Ocular Hx-  PMH- DM; high chol; HTN; Last A1C- 7.1 (9.7.18);    CURRENT MEDICATIONS: No current outpatient medications on file. (Ophthalmic Drugs)   No current facility-administered medications for this visit.  (Ophthalmic Drugs)   Current Outpatient Medications (Other)  Medication Sig  . aspirin EC 81 MG tablet Take 1 tablet (81 mg total) by mouth daily.  Marland Kitchen. lisinopril (PRINIVIL,ZESTRIL) 10 MG tablet Take 1 tablet (10 mg total) by mouth daily.  . metFORMIN (GLUCOPHAGE) 850 MG tablet Take 1 tablet (850 mg total) by mouth 2 (two) times daily with a meal.  . simvastatin (ZOCOR) 40 MG tablet Take 1 tablet (40 mg total) by mouth at bedtime.   Current Facility-Administered Medications (Other)  Medication Route  . Bevacizumab (AVASTIN) SOLN 1.25 mg Intravitreal       REVIEW OF SYSTEMS: ROS    Positive for: Endocrine, Eyes   Negative for: Constitutional, Gastrointestinal, Neurological, Skin, Genitourinary, Musculoskeletal, HENT, Cardiovascular, Respiratory, Psychiatric, Allergic/Imm, Heme/Lymph   Last edited by Posey BoyerBrown, Amanda J, COT on 07/27/2017  1:03 PM. (History)       ALLERGIES No Known Allergies  PAST MEDICAL HISTORY Past Medical History:  Diagnosis Date  . Cataract   . Diabetes mellitus without complication (HCC)   . Hyperlipidemia   . Hypertension    Past Surgical History:  Procedure Laterality Date  . CESAREAN SECTION     1988    FAMILY HISTORY Family History  Problem Relation Age of Onset  . Cancer Mother        breast and colon cancer  . Diabetes Brother   . Diabetes Son   . Asthma Father        COPD  . Diabetes Son     SOCIAL HISTORY Social History   Tobacco Use  . Smoking status: Never Smoker  . Smokeless tobacco: Never Used  Substance Use Topics  . Alcohol use: No  . Drug use: No         OPHTHALMIC EXAM:  Base Eye Exam    Visual Acuity (Snellen - Linear)      Right Left   Dist [place] 20/40 -1 20/30 -1  Dist [place] 20/40    Dist ph [place] NI NI   Dist ph cc 20/40 +2    Correction:  Glasses       Tonometry (Tonopen, 1:00 PM)      Right Left   Pressure 17 15       Pupils      Dark Light Shape React APD   Right 4 3.5 Round Slow None   Left 4 3.5 Round Slow None       Visual Fields (Counting fingers)      Left Right    Full Full       Extraocular Movement      Right Left    Full, Ortho Full, Ortho       Neuro/Psych    Oriented x3:  Yes   Mood/Affect:  Normal       Dilation    Both eyes:  1.0% Mydriacyl, 2.5% Phenylephrine @ 1:00 PM        Slit Lamp and Fundus Exam    Slit Lamp Exam      Right Left   Lids/Lashes Dermatochalasis - upper lid, Meibomian gland dysfunction Dermatochalasis - upper lid, Meibomian gland dysfunction   Conjunctiva/Sclera White and quiet White and quiet    Cornea Mild Arcus Mild Arcus   Anterior Chamber Deep and quiet Deep and quiet   Iris Round and dilated, No NVI Round and dilated, No NVI   Lens 2-3+ Nuclear sclerosis, 2+ Cortical cataract 2-3+ Nuclear sclerosis, 2+ Cortical cataract   Vitreous Normal Normal       Fundus Exam      Right Left   Disc Normal, No NVD Normal, No NVD   C/D Ratio 0.4 0.4   Macula blunted foveal reflex, perifoveal exudates, MAs, exudates infro temportal to fovea blunted foveal relfex, exudates temporal to fovea, MAs   Vessels Tortuous, AV crossing changes, Copper wiring Tortuous with early veinous beading    Periphery Attached, scattered MAs, DBH, exudates inferior to disc attached, CWS nasally, DBH, exudates          IMAGING AND PROCEDURES  Imaging and Procedures for 07/27/17  OCT, Retina - OU - Both Eyes     Right Eye Quality was good. Central Foveal Thickness: 397. Progression has worsened. Findings include abnormal foveal contour, intraretinal hyper-reflective material, no SRF, intraretinal fluid.   Left Eye Quality was good. Central Foveal Thickness: 356. Progression has been stable. Findings include abnormal foveal contour, intraretinal fluid, intraretinal hyper-reflective material, no SRF.   Notes Images taken, stored on drive  Diagnosis / Impression:  DME OU (OD>OS) Interval worsening of IRF OD Stable DME OS  Clinical management:  See below  Abbreviations: NFP - Normal foveal profile. CME - cystoid macular edema. PED - pigment epithelial detachment. IRF - intraretinal fluid. SRF - subretinal fluid. EZ - ellipsoid zone. ERM - epiretinal membrane. ORA - outer retinal atrophy. ORT - outer retinal tubulation. SRHM - subretinal hyper-reflective material         Intravitreal Injection, Pharmacologic Agent - OD - Right Eye     Time Out 07/27/2017. 2:31 PM. Confirmed correct patient, procedure, site, and patient consented.   Anesthesia Topical anesthesia was used. Anesthetic medications  included Lidocaine 2%, Tetracaine 0.5%.   Procedure Preparation included 5% betadine to ocular surface. A supplied needle was used.   Injection: 1.25 mg Bevacizumab 1.25mg /0.53ml   NDC: 62952-841-32    Lot: [id number]     Expiration Date: 09/13/2017   Route: Intravitreal   Site: Right  Eye   Waste: 0 mg  Post-op Post injection exam found visual acuity of at least counting fingers. The patient tolerated the procedure well. There were no complications. The patient received written and verbal post procedure care education.                 ASSESSMENT/PLAN:    ICD-10-CM   1. Moderate nonproliferative diabetic retinopathy of both eyes with macular edema associated with type 2 diabetes mellitus (HCC) E11.3313 OCT, Retina - OU - Both Eyes    Intravitreal Injection, Pharmacologic Agent - OD - Right Eye    Bevacizumab (AVASTIN) SOLN 1.25 mg  2. Hypertensive retinopathy of both eyes H35.033   3. Combined forms of age-related cataract of both eyes H25.813     1. moderate Non-Proliferative diabetic retinopathy, both eyes The incidence, risk factors for progression, natural history and treatment options for diabetic retinopathy  were discussed with patient.  The need for close monitoring of blood glucose, blood pressure, and serum lipids, avoiding cigarette or any type of tobacco, and the need for long term follow up was also discussed with patient. - no NV on exam or prior fluorescein angiogram - Diabetic macular edema, both eyes  The natural history, pathology, and characteristics of diabetic macular edema discussed with patient.  A generalized discussion of the major clinical trials concerning treatment of diabetic macular edema (ETDRS, DCT, SCORE, RISE / RIDE, and ongoing DRCR net studies) was completed.  This discussion included mention of the various approaches to treating diabetic macular edema (observation, laser photocoagulation, anti-VEGF injections with lucentis / Avastin / Eylea,  steroid injections with Kenalog / Ozurdex, and intraocular surgery with vitrectomy).  The goal hemoglobin A1C of 6-7 was discussed, as well as importance of smoking cessation and hypertension control.  Need for ongoing treatment and monitoring were specifically discussed with reference to chronic nature of diabetic macular edema.  - today, interval increase in DME OD on OCT and BCVA decreased to 20/40 from 20/25 OD - OS stable - discussed findings, prognosis, and possible treatments - recommend IVA #1 OD today, 12.17.18 - pt wishes to proceed with treatment - f/u in 4 weeks with repeat DFE/OCT/possible injection  2. Hypertensive retinopathy OU - discussed importance of tight BP control - monitor  3. Nuclear and Cortical Cataracts OU;  - The symptoms of cataract, surgical options, and treatments and risks were discussed with patient. - discussed diagnosis and progression - not yet visually significant - monitor for now   Ophthalmic Meds Ordered this visit:  Meds ordered this encounter  Medications  . Bevacizumab (AVASTIN) SOLN 1.25 mg       Return in about 4 weeks (around 08/24/2017) for F/U NPDR OU.  There are no Patient Instructions on file for this visit.   Explained the diagnoses, plan, and follow up with the patient and they expressed understanding.  Patient expressed understanding of the importance of proper follow up care.   This document serves as a record of services personally performed by Karie ChimeraBrian G. Uva Runkel, MD, PhD. It was created on their behalf by Virgilio BellingMeredith Fabian, COA, a certified ophthalmic assistant. The creation of this record is the provider's dictation and/or activities during the visit.  Electronically signed by: Virgilio BellingMeredith Fabian, COA  07/27/17 2:35 PM    Karie ChimeraBrian G. Adelfa Lozito, M.D., Ph.D. Diseases & Surgery of the Retina and Vitreous [facility] 07/27/17   I have reviewed the above documentation for accuracy and completeness, and I agree  with  the above. Karie Chimera, M.D., Ph.D. 07/27/17 2:36 PM    Abbreviations: M myopia (nearsighted); A astigmatism; H hyperopia (farsighted); P presbyopia; Mrx spectacle prescription;  CTL contact lenses; OD right eye; OS left eye; OU both eyes  XT exotropia; ET esotropia; PEK punctate epithelial keratitis; PEE punctate epithelial erosions; DES dry eye syndrome; MGD meibomian gland dysfunction; ATs artificial tears; PFAT's preservative free artificial tears; NSC nuclear sclerotic cataract; PSC posterior subcapsular cataract; ERM epi-retinal membrane; PVD posterior vitreous detachment; RD retinal detachment; DM diabetes mellitus; DR diabetic retinopathy; NPDR non-proliferative diabetic retinopathy; PDR proliferative diabetic retinopathy; CSME clinically significant macular edema; DME diabetic macular edema; dbh dot blot hemorrhages; CWS cotton wool spot; POAG primary open angle glaucoma; C/D cup-to-disc ratio; HVF humphrey visual field; GVF goldmann visual field; OCT optical coherence tomography; IOP intraocular pressure; BRVO Branch retinal vein occlusion; CRVO central retinal vein occlusion; CRAO central retinal artery occlusion; BRAO branch retinal artery occlusion; RT retinal tear; SB scleral buckle; PPV pars plana vitrectomy; VH Vitreous hemorrhage; PRP panretinal laser photocoagulation; IVK intravitreal kenalog; VMT vitreomacular traction; MH Macular hole;  NVD neovascularization of the disc; NVE neovascularization elsewhere; AREDS age related eye disease study; ARMD age related macular degeneration; POAG primary open angle glaucoma; EBMD epithelial/anterior basement membrane dystrophy; ACIOL anterior chamber intraocular lens; IOL intraocular lens; PCIOL posterior chamber intraocular lens; Phaco/IOL phacoemulsification with intraocular lens placement; PRK photorefractive keratectomy; LASIK laser assisted in situ keratomileusis; HTN hypertension; DM diabetes mellitus; COPD chronic obstructive pulmonary  disease

## 2017-07-27 ENCOUNTER — Ambulatory Visit (INDEPENDENT_AMBULATORY_CARE_PROVIDER_SITE_OTHER): Payer: BLUE CROSS/BLUE SHIELD | Admitting: Ophthalmology

## 2017-07-27 ENCOUNTER — Encounter (INDEPENDENT_AMBULATORY_CARE_PROVIDER_SITE_OTHER): Payer: Self-pay | Admitting: Ophthalmology

## 2017-07-27 DIAGNOSIS — H35033 Hypertensive retinopathy, bilateral: Secondary | ICD-10-CM

## 2017-07-27 DIAGNOSIS — H25813 Combined forms of age-related cataract, bilateral: Secondary | ICD-10-CM | POA: Diagnosis not present

## 2017-07-27 DIAGNOSIS — E113313 Type 2 diabetes mellitus with moderate nonproliferative diabetic retinopathy with macular edema, bilateral: Secondary | ICD-10-CM

## 2017-07-27 MED ORDER — BEVACIZUMAB CHEMO INJECTION 1.25MG/0.05ML SYRINGE FOR KALEIDOSCOPE
1.2500 mg | INTRAVITREAL | Status: DC
  Administered 2017-07-27: 1.25 mg via INTRAVITREAL

## 2017-07-28 ENCOUNTER — Other Ambulatory Visit: Payer: Self-pay | Admitting: Family Medicine

## 2017-07-28 DIAGNOSIS — I10 Essential (primary) hypertension: Secondary | ICD-10-CM

## 2017-07-31 LAB — HM MAMMOGRAPHY: HM Mammogram: NORMAL (ref 0–4)

## 2017-08-10 ENCOUNTER — Encounter (INDEPENDENT_AMBULATORY_CARE_PROVIDER_SITE_OTHER): Payer: BLUE CROSS/BLUE SHIELD | Admitting: Ophthalmology

## 2017-08-24 ENCOUNTER — Encounter (INDEPENDENT_AMBULATORY_CARE_PROVIDER_SITE_OTHER): Payer: BLUE CROSS/BLUE SHIELD | Admitting: Ophthalmology

## 2017-09-04 NOTE — Progress Notes (Signed)
[facility] - Clinic Note  09/07/2017     CHIEF COMPLAINT Patient presents for Retina Follow Up   HISTORY OF PRESENT ILLNESS: Carolyn Hunter is a 67 y.o. female who presents to the clinic today for:   HPI    Retina Follow Up    Patient presents with  Diabetic Retinopathy.  In both eyes.  Severity is moderate.  Duration of 4 weeks.  Since onset it is stable.  I, the attending physician,  performed the HPI with the patient and updated documentation appropriately.          Comments    F/U NPDR w/ ME OU; Pt states OU VA is stable; Pt states she is unsure if OU VA has improved any; pt reports she tolerated last injection well; Pt reports she is prepared to have repeat injection today if needed; Pt denies floaters, denies flashes, denies wavy VA, denies ocular pain;        Last edited by Rennis Chris, MD on 09/07/2017  1:54 PM. (History)      Referring physician: Aliene Beams, MD [address] [place], [place] [address]  HISTORICAL INFORMATION:   Selected notes from the MEDICAL RECORD NUMBER Referral from Dr. Fabienne Bruns for DM exam;  Ocular Hx-  PMH- DM; high chol; HTN; Last A1C- 7.1 (9.7.18);    CURRENT MEDICATIONS: No current outpatient medications on file. (Ophthalmic Drugs)   No current facility-administered medications for this visit.  (Ophthalmic Drugs)   Current Outpatient Medications (Other)  Medication Sig  . aspirin EC 81 MG tablet Take 1 tablet (81 mg total) by mouth daily.  Marland Kitchen lisinopril (PRINIVIL,ZESTRIL) 10 MG tablet TAKE 1 TABLET BY MOUTH ONCE DAILY  . metFORMIN (GLUCOPHAGE) 850 MG tablet Take 1 tablet (850 mg total) by mouth 2 (two) times daily with a meal.  . simvastatin (ZOCOR) 40 MG tablet Take 1 tablet (40 mg total) by mouth at bedtime.   Current Facility-Administered Medications (Other)  Medication Route  . Bevacizumab (AVASTIN) SOLN 1.25 mg Intravitreal  . Bevacizumab (AVASTIN) SOLN 1.25 mg Intravitreal      REVIEW OF  SYSTEMS: ROS    Positive for: Eyes   Negative for: Constitutional, Gastrointestinal, Neurological, Skin, Genitourinary, Musculoskeletal, HENT, Endocrine, Cardiovascular, Respiratory, Psychiatric, Allergic/Imm, Heme/Lymph   Last edited by Concepcion Elk on 09/07/2017  1:08 PM. (History)       ALLERGIES No Known Allergies  PAST MEDICAL HISTORY Past Medical History:  Diagnosis Date  . Cataract   . Diabetes mellitus without complication (HCC)   . Hyperlipidemia   . Hypertension    Past Surgical History:  Procedure Laterality Date  . CESAREAN SECTION     1988    FAMILY HISTORY Family History  Problem Relation Age of Onset  . Cancer Mother        breast and colon cancer  . Diabetes Brother   . Diabetes Son   . Asthma Father        COPD  . Diabetes Son     SOCIAL HISTORY Social History   Tobacco Use  . Smoking status: Never Smoker  . Smokeless tobacco: Never Used  Substance Use Topics  . Alcohol use: No  . Drug use: No         OPHTHALMIC EXAM:  Base Eye Exam    Visual Acuity (Snellen - Linear)      Right Left   Dist [place] 20/40 20/40   Dist ph [place] 20/30 20/30  Tonometry (Tonopen, 1:13 PM)      Right Left   Pressure 16 17       Pupils      Dark Light Shape React APD   Right 4 3 Round Brisk None   Left 4 3 Round Brisk None       Visual Fields (Counting fingers)      Left Right    Full Full       Extraocular Movement      Right Left    Full, Ortho Full, Ortho       Neuro/Psych    Oriented x3:  Yes   Mood/Affect:  Normal       Dilation    Both eyes:  1.0% Mydriacyl, 2.5% Phenylephrine @ 1:13 PM        Slit Lamp and Fundus Exam    Slit Lamp Exam      Right Left   Lids/Lashes Dermatochalasis - upper lid, Meibomian gland dysfunction Dermatochalasis - upper lid, Meibomian gland dysfunction   Conjunctiva/Sclera White and quiet White and quiet   Cornea Mild Arcus Mild Arcus   Anterior Chamber Deep and quiet Deep and quiet   Iris  Round and dilated, No NVI Round and dilated, No NVI   Lens 2-3+ Nuclear sclerosis, 2+ Cortical cataract 2-3+ Nuclear sclerosis, 2+ Cortical cataract   Vitreous Normal Normal       Fundus Exam      Right Left   Disc Normal, No NVD Normal, No NVD   C/D Ratio 0.4 0.4   Macula blunted foveal reflex, perifoveal exudates, MAs, exudates inefrotemporal to fovea - all improved blunted foveal relfex, exudates temporal to fovea, MAs   Vessels Tortuous, AV crossing changes, Copper wiring Tortuous with early veinous beading    Periphery Attached, scattered MAs, DBH, exudates inferior to disc attached, CWS nasally, DBH, exudates          IMAGING AND PROCEDURES  Imaging and Procedures for 09/07/17  OCT, Retina - OU - Both Eyes     Right Eye Quality was borderline. Central Foveal Thickness: 372. Progression has improved. Findings include abnormal foveal contour, intraretinal hyper-reflective material, no SRF, intraretinal fluid (Improved but persistent IRF ).   Left Eye Quality was good. Central Foveal Thickness: 345. Progression has improved. Findings include abnormal foveal contour, intraretinal fluid, intraretinal hyper-reflective material, no SRF (Interval improvement in cystic changes).   Notes Images taken, stored on drive  Diagnosis / Impression:  DME OU (OD>OS) Improved but persistent IRF OD Stable DME OS  Clinical management:  See below  Abbreviations: NFP - Normal foveal profile. CME - cystoid macular edema. PED - pigment epithelial detachment. IRF - intraretinal fluid. SRF - subretinal fluid. EZ - ellipsoid zone. ERM - epiretinal membrane. ORA - outer retinal atrophy. ORT - outer retinal tubulation. SRHM - subretinal hyper-reflective material         Intravitreal Injection, Pharmacologic Agent - OD - Right Eye     Time Out 09/07/2017. 2:02 PM. Confirmed correct patient, procedure, site, and patient consented.   Anesthesia Topical anesthesia was used. Anesthetic medications  included Lidocaine 2%, Tetracaine 0.5%.   Procedure Preparation included 5% betadine to ocular surface, eyelid speculum. A supplied needle was used.   Injection: 1.25 mg Bevacizumab 1.25mg /0.2905ml   NDC: 40981-191-4750242-060-01    Lot: [id number]     Expiration Date: 10/04/2017   Route: Intravitreal   Site: Right Eye   Waste: 0 mg  Post-op Post injection exam found visual acuity of at least  counting fingers. The patient tolerated the procedure well. There were no complications. The patient received written and verbal post procedure care education.                 ASSESSMENT/PLAN:    ICD-10-CM   1. Moderate nonproliferative diabetic retinopathy of both eyes with macular edema associated with type 2 diabetes mellitus (HCC) E11.3313 OCT, Retina - OU - Both Eyes    Intravitreal Injection, Pharmacologic Agent - OD - Right Eye    Bevacizumab (AVASTIN) SOLN 1.25 mg  2. Hypertensive retinopathy of both eyes H35.033   3. Combined forms of age-related cataract of both eyes H25.813     1. Moderate nonproliferative diabetic retinopathy, both eyes The incidence, risk factors for progression, natural history and treatment options for diabetic retinopathy  were discussed with patient.  The need for close monitoring of blood glucose, blood pressure, and serum lipids, avoiding cigarette or any type of tobacco, and the need for long term follow up was also discussed with patient. - no NV on exam or prior fluorescein angiogram - Diabetic macular edema, both eyes  The natural history, pathology, and characteristics of diabetic macular edema discussed with patient.  A generalized discussion of the major clinical trials concerning treatment of diabetic macular edema (ETDRS, DCT, SCORE, RISE / RIDE, and ongoing DRCR net studies) was completed.  This discussion included mention of the various approaches to treating diabetic macular edema (observation, laser photocoagulation, anti-VEGF injections with lucentis /  Avastin / Eylea, steroid injections with Kenalog / Ozurdex, and intraocular surgery with vitrectomy).  The goal hemoglobin A1C of 6-7 was discussed, as well as importance of smoking cessation and hypertension control.  Need for ongoing treatment and monitoring were specifically discussed with reference to chronic nature of diabetic macular edema.  - S/P IVA OD #1 (12.17.18) - today, interval improvement in DME OD on OCT and BCVA improved to 20/30 from 20/40 OD - OS stable/slightly improved - recommend IVA #2 OD today, (01.28.19) - pt wishes to proceed with treatment - f/u in 4 weeks with repeat DFE/OCT/possible injection/FA (optos)  2. Hypertensive retinopathy OU - discussed importance of tight BP control - monitor  3. Nuclear and Cortical Cataracts OU;  - The symptoms of cataract, surgical options, and treatments and risks were discussed with patient. - discussed diagnosis and progression - not yet visually significant - monitor for now   Ophthalmic Meds Ordered this visit:  Meds ordered this encounter  Medications  . Bevacizumab (AVASTIN) SOLN 1.25 mg       Return in about 4 weeks (around 10/05/2017) for F/U NPDR with ME OU.  There are no Patient Instructions on file for this visit.   Explained the diagnoses, plan, and follow up with the patient and they expressed understanding.  Patient expressed understanding of the importance of proper follow up care.   This document serves as a record of services personally performed by Karie Chimera, MD, PhD. It was created on their behalf by Virgilio Belling, COA, a certified ophthalmic assistant. The creation of this record is the provider's dictation and/or activities during the visit.  Electronically signed by: Virgilio Belling, COA  09/07/17 2:12 PM    Karie Chimera, M.D., Ph.D. Diseases & Surgery of the Retina and Vitreous [facility] 09/07/17   I have reviewed the above documentation for accuracy and  completeness, and I agree with the above. Karie Chimera, M.D., Ph.D. 09/07/17 2:12 PM    Abbreviations:  M myopia (nearsighted); A astigmatism; H hyperopia (farsighted); P presbyopia; Mrx spectacle prescription;  CTL contact lenses; OD right eye; OS left eye; OU both eyes  XT exotropia; ET esotropia; PEK punctate epithelial keratitis; PEE punctate epithelial erosions; DES dry eye syndrome; MGD meibomian gland dysfunction; ATs artificial tears; PFAT's preservative free artificial tears; NSC nuclear sclerotic cataract; PSC posterior subcapsular cataract; ERM epi-retinal membrane; PVD posterior vitreous detachment; RD retinal detachment; DM diabetes mellitus; DR diabetic retinopathy; NPDR non-proliferative diabetic retinopathy; PDR proliferative diabetic retinopathy; CSME clinically significant macular edema; DME diabetic macular edema; dbh dot blot hemorrhages; CWS cotton wool spot; POAG primary open angle glaucoma; C/D cup-to-disc ratio; HVF humphrey visual field; GVF goldmann visual field; OCT optical coherence tomography; IOP intraocular pressure; BRVO Branch retinal vein occlusion; CRVO central retinal vein occlusion; CRAO central retinal artery occlusion; BRAO branch retinal artery occlusion; RT retinal tear; SB scleral buckle; PPV pars plana vitrectomy; VH Vitreous hemorrhage; PRP panretinal laser photocoagulation; IVK intravitreal kenalog; VMT vitreomacular traction; MH Macular hole;  NVD neovascularization of the disc; NVE neovascularization elsewhere; AREDS age related eye disease study; ARMD age related macular degeneration; POAG primary open angle glaucoma; EBMD epithelial/anterior basement membrane dystrophy; ACIOL anterior chamber intraocular lens; IOL intraocular lens; PCIOL posterior chamber intraocular lens; Phaco/IOL phacoemulsification with intraocular lens placement; PRK photorefractive keratectomy; LASIK laser assisted in situ keratomileusis; HTN hypertension; DM diabetes mellitus; COPD  chronic obstructive pulmonary disease

## 2017-09-07 ENCOUNTER — Ambulatory Visit (INDEPENDENT_AMBULATORY_CARE_PROVIDER_SITE_OTHER): Payer: BLUE CROSS/BLUE SHIELD | Admitting: Ophthalmology

## 2017-09-07 ENCOUNTER — Encounter (INDEPENDENT_AMBULATORY_CARE_PROVIDER_SITE_OTHER): Payer: Self-pay | Admitting: Ophthalmology

## 2017-09-07 DIAGNOSIS — E113313 Type 2 diabetes mellitus with moderate nonproliferative diabetic retinopathy with macular edema, bilateral: Secondary | ICD-10-CM

## 2017-09-07 DIAGNOSIS — H35033 Hypertensive retinopathy, bilateral: Secondary | ICD-10-CM | POA: Diagnosis not present

## 2017-09-07 DIAGNOSIS — H25813 Combined forms of age-related cataract, bilateral: Secondary | ICD-10-CM | POA: Diagnosis not present

## 2017-09-07 MED ORDER — BEVACIZUMAB CHEMO INJECTION 1.25MG/0.05ML SYRINGE FOR KALEIDOSCOPE
1.2500 mg | INTRAVITREAL | Status: DC
  Administered 2017-09-07: 1.25 mg via INTRAVITREAL

## 2017-09-18 ENCOUNTER — Encounter: Payer: Self-pay | Admitting: Family Medicine

## 2017-10-02 NOTE — Progress Notes (Signed)
[facility] - Clinic Note  10/07/2017     CHIEF COMPLAINT Patient presents for Retina Follow Up   HISTORY OF PRESENT ILLNESS: Carolyn Hunter is a 67 y.o. female who presents to the clinic today for:   HPI    Retina Follow Up    Patient presents with  Other.  In both eyes.  This started 7 months ago.  Severity is mild.  Since onset it is gradually improving.  I, the attending physician,  performed the HPI with the patient and updated documentation appropriately.          Comments    F/U NPDR W/ME OU. Patient states her vision has improved, denies wavy vision and ocular pain. Bs 110 this am . A1C 7. Unsure date. Pt reports she is ready for Avastin Od today. Denies Gtt's Terald Sleeper       Last edited by Rennis Chris, MD on 10/07/2017  1:42 PM. (History)      Referring physician: Aliene Beams, MD [address] [place], [place] [address]  HISTORICAL INFORMATION:   Selected notes from the MEDICAL RECORD NUMBER Referral from Dr. Fabienne Bruns for DM exam;  Ocular Hx-  PMH- DM; high chol; HTN; Last A1C- 7.1 (9.7.18);    CURRENT MEDICATIONS: No current outpatient medications on file. (Ophthalmic Drugs)   No current facility-administered medications for this visit.  (Ophthalmic Drugs)   Current Outpatient Medications (Other)  Medication Sig  . aspirin EC 81 MG tablet Take 1 tablet (81 mg total) by mouth daily.  Marland Kitchen lisinopril (PRINIVIL,ZESTRIL) 10 MG tablet TAKE 1 TABLET BY MOUTH ONCE DAILY  . metFORMIN (GLUCOPHAGE) 850 MG tablet Take 1 tablet (850 mg total) by mouth 2 (two) times daily with a meal.  . simvastatin (ZOCOR) 40 MG tablet Take 1 tablet (40 mg total) by mouth at bedtime.   Current Facility-Administered Medications (Other)  Medication Route  . Bevacizumab (AVASTIN) SOLN 1.25 mg Intravitreal  . Bevacizumab (AVASTIN) SOLN 1.25 mg Intravitreal  . Bevacizumab (AVASTIN) SOLN 1.25 mg Intravitreal      REVIEW OF SYSTEMS: ROS    Positive for:  Neurological, Endocrine, Eyes   Negative for: Constitutional, Gastrointestinal, Skin, Genitourinary, Musculoskeletal, HENT, Cardiovascular, Respiratory, Psychiatric, Allergic/Imm, Heme/Lymph   Last edited by Eldridge Scot, LPN on 01/13/5408  1:22 PM. (History)       ALLERGIES No Known Allergies  PAST MEDICAL HISTORY Past Medical History:  Diagnosis Date  . Cataract   . Diabetes mellitus without complication (HCC)   . Hyperlipidemia   . Hypertension    Past Surgical History:  Procedure Laterality Date  . CESAREAN SECTION     1988    FAMILY HISTORY Family History  Problem Relation Age of Onset  . Cancer Mother        breast and colon cancer  . Diabetes Brother   . Diabetes Son   . Asthma Father        COPD  . Diabetes Son     SOCIAL HISTORY Social History   Tobacco Use  . Smoking status: Never Smoker  . Smokeless tobacco: Never Used  Substance Use Topics  . Alcohol use: No  . Drug use: No         OPHTHALMIC EXAM:  Base Eye Exam    Visual Acuity (Snellen - Linear)      Right Left   Dist [place] 20/40 +2 20/30 -2   Dist [place] 20/30 +2 NI  Tonometry (Tonopen, 1:32 PM)      Right Left   Pressure 18 16       Pupils      Dark Light Shape React APD   Right 3 2 Round Slow None   Left 3 2 Round Slow None       Visual Fields (Counting fingers)      Left Right    Full Full       Extraocular Movement      Right Left    Full, Ortho Full, Ortho       Neuro/Psych    Oriented x3:  Yes   Mood/Affect:  Normal       Dilation    Both eyes:  1.0% Mydriacyl, 2.5% Phenylephrine @ 1:32 PM        Slit Lamp and Fundus Exam    Slit Lamp Exam      Right Left   Lids/Lashes Dermatochalasis - upper lid, Meibomian gland dysfunction Dermatochalasis - upper lid, Meibomian gland dysfunction   Conjunctiva/Sclera White and quiet White and quiet   Cornea Mild Arcus Mild Arcus, EBMD, 1+ Punctate epithelial erosions   Anterior Chamber Deep and quiet Deep and  quiet   Iris Round and dilated, No NVI Round and dilated, No NVI   Lens 2-3+ Nuclear sclerosis, 2+ Cortical cataract 2-3+ Nuclear sclerosis, 2+ Cortical cataract   Vitreous Normal Normal       Fundus Exam      Right Left   Disc Normal, No NVD Normal, No NVD   C/D Ratio 0.4 0.4   Macula blunted foveal reflex, perifoveal exudates, MAs, exudates inefrotemporal to fovea - all slightly improved blunted foveal relfex, exudates temporal to fovea, MAs   Vessels Tortuous, AV crossing changes, Copper wiring Tortuous with early veinous beading    Periphery Attached, scattered MAs, DBH, exudates inferior to disc, CWS inferior to disc attached, CWS nasally, DBH, exudates          IMAGING AND PROCEDURES  Imaging and Procedures for 10/07/17  OCT, Retina - OU - Both Eyes     Right Eye Quality was borderline. Central Foveal Thickness: 263. Progression has improved. Findings include abnormal foveal contour, intraretinal hyper-reflective material, no SRF, intraretinal fluid (Improved but persistent IRF ).   Left Eye Quality was borderline. Central Foveal Thickness: 330. Progression has improved. Findings include abnormal foveal contour, intraretinal fluid, intraretinal hyper-reflective material, no SRF (Interval improvement in cystic changes).   Notes Images taken, stored on drive  Diagnosis / Impression:  DME OU (OD>OS) Improved but persistent IRF OD Stable to improved DME OS  Clinical management:  See below  Abbreviations: NFP - Normal foveal profile. CME - cystoid macular edema. PED - pigment epithelial detachment. IRF - intraretinal fluid. SRF - subretinal fluid. EZ - ellipsoid zone. ERM - epiretinal membrane. ORA - outer retinal atrophy. ORT - outer retinal tubulation. SRHM - subretinal hyper-reflective material         Intravitreal Injection, Pharmacologic Agent - OD - Right Eye     Time Out 10/07/2017. 2:01 PM. Confirmed correct patient, procedure, site, and patient consented.    Anesthesia Topical anesthesia was used. Anesthetic medications included Lidocaine 2%, Tetracaine 0.5%.   Procedure Preparation included 5% betadine to ocular surface, eyelid speculum. A supplied needle was used.   Injection: 1.25 mg Bevacizumab 1.25mg /0.6705ml   NDC: 16[address]-604-5450242-060-01    Lot: [id number]     Expiration Date: 11/25/2017   Route: Intravitreal   Site: Right Eye   Waste: 0  mg  Post-op Post injection exam found visual acuity of at least counting fingers. The patient tolerated the procedure well. There were no complications. The patient received written and verbal post procedure care education.                 ASSESSMENT/PLAN:    ICD-10-CM   1. Moderate nonproliferative diabetic retinopathy of both eyes with macular edema associated with type 2 diabetes mellitus (HCC) E11.3313 OCT, Retina - OU - Both Eyes    Intravitreal Injection, Pharmacologic Agent - OD - Right Eye    Bevacizumab (AVASTIN) SOLN 1.25 mg  2. Hypertensive retinopathy of both eyes H35.033   3. Combined forms of age-related cataract of both eyes H25.813     1. Moderate nonproliferative diabetic retinopathy, both eyes The incidence, risk factors for progression, natural history and treatment options for diabetic retinopathy  were discussed with patient.  The need for close monitoring of blood glucose, blood pressure, and serum lipids, avoiding cigarette or any type of tobacco, and the need for long term follow up was also discussed with patient. - no NV on exam or prior fluorescein angiogram - Diabetic macular edema, both eyes  - S/P IVA OD #1 (12.17.18), #2 (01.28.19) - today, mild interval improvement in DME OD on OCT and BCVA stable at 20/30 from 20/40 OD - OS stable/slightly improved - recommend IVA #3 OD today, (02.27.19) - pt wishes to proceed with treatment - f/u in 4 weeks with repeat DFE/OCT/possible injection/FA (optos)  2. Hypertensive retinopathy OU - discussed importance of tight BP  control - monitor  3. Nuclear and Cortical Cataracts OU;  - The symptoms of cataract, surgical options, and treatments and risks were discussed with patient. - discussed diagnosis and progression - not yet visually significant - monitor for now   Ophthalmic Meds Ordered this visit:  Meds ordered this encounter  Medications  . Bevacizumab (AVASTIN) SOLN 1.25 mg       Return in about 4 weeks (around 11/04/2017) for F/U NPDR w/ DME OU, OCT, Optos FA.  There are no Patient Instructions on file for this visit.   Explained the diagnoses, plan, and follow up with the patient and they expressed understanding.  Patient expressed understanding of the importance of proper follow up care.   This document serves as a record of services personally performed by Karie Chimera, MD, PhD. It was created on their behalf by Virgilio Belling, COA, a certified ophthalmic assistant. The creation of this record is the provider's dictation and/or activities during the visit.  Electronically signed by: Virgilio Belling, COA  10/07/17 2:23 PM    Karie Chimera, M.D., Ph.D. Diseases & Surgery of the Retina and Vitreous [facility] 10/07/17  I have reviewed the above documentation for accuracy and completeness, and I agree with the above. Karie Chimera, M.D., Ph.D. 10/07/17 2:24 PM     Abbreviations: M myopia (nearsighted); A astigmatism; H hyperopia (farsighted); P presbyopia; Mrx spectacle prescription;  CTL contact lenses; OD right eye; OS left eye; OU both eyes  XT exotropia; ET esotropia; PEK punctate epithelial keratitis; PEE punctate epithelial erosions; DES dry eye syndrome; MGD meibomian gland dysfunction; ATs artificial tears; PFAT's preservative free artificial tears; NSC nuclear sclerotic cataract; PSC posterior subcapsular cataract; ERM epi-retinal membrane; PVD posterior vitreous detachment; RD retinal detachment; DM diabetes mellitus; DR diabetic retinopathy; NPDR  non-proliferative diabetic retinopathy; PDR proliferative diabetic retinopathy; CSME clinically significant macular edema; DME diabetic macular edema; dbh dot  blot hemorrhages; CWS cotton wool spot; POAG primary open angle glaucoma; C/D cup-to-disc ratio; HVF humphrey visual field; GVF goldmann visual field; OCT optical coherence tomography; IOP intraocular pressure; BRVO Branch retinal vein occlusion; CRVO central retinal vein occlusion; CRAO central retinal artery occlusion; BRAO branch retinal artery occlusion; RT retinal tear; SB scleral buckle; PPV pars plana vitrectomy; VH Vitreous hemorrhage; PRP panretinal laser photocoagulation; IVK intravitreal kenalog; VMT vitreomacular traction; MH Macular hole;  NVD neovascularization of the disc; NVE neovascularization elsewhere; AREDS age related eye disease study; ARMD age related macular degeneration; POAG primary open angle glaucoma; EBMD epithelial/anterior basement membrane dystrophy; ACIOL anterior chamber intraocular lens; IOL intraocular lens; PCIOL posterior chamber intraocular lens; Phaco/IOL phacoemulsification with intraocular lens placement; [place] photorefractive keratectomy; LASIK laser assisted in situ keratomileusis; HTN hypertension; DM diabetes mellitus; COPD chronic obstructive pulmonary disease

## 2017-10-07 ENCOUNTER — Ambulatory Visit (INDEPENDENT_AMBULATORY_CARE_PROVIDER_SITE_OTHER): Payer: BLUE CROSS/BLUE SHIELD | Admitting: Ophthalmology

## 2017-10-07 ENCOUNTER — Encounter (INDEPENDENT_AMBULATORY_CARE_PROVIDER_SITE_OTHER): Payer: Self-pay | Admitting: Ophthalmology

## 2017-10-07 DIAGNOSIS — E113313 Type 2 diabetes mellitus with moderate nonproliferative diabetic retinopathy with macular edema, bilateral: Secondary | ICD-10-CM | POA: Diagnosis not present

## 2017-10-07 DIAGNOSIS — H35033 Hypertensive retinopathy, bilateral: Secondary | ICD-10-CM | POA: Diagnosis not present

## 2017-10-07 DIAGNOSIS — H25813 Combined forms of age-related cataract, bilateral: Secondary | ICD-10-CM | POA: Diagnosis not present

## 2017-10-07 MED ORDER — BEVACIZUMAB CHEMO INJECTION 1.25MG/0.05ML SYRINGE FOR KALEIDOSCOPE
1.2500 mg | INTRAVITREAL | Status: DC
Start: 2017-10-07 — End: 2018-01-05
  Administered 2017-10-07: 1.25 mg via INTRAVITREAL

## 2017-10-16 ENCOUNTER — Other Ambulatory Visit: Payer: Self-pay | Admitting: Family Medicine

## 2017-10-16 DIAGNOSIS — I10 Essential (primary) hypertension: Secondary | ICD-10-CM

## 2017-11-09 NOTE — Progress Notes (Signed)
[facility] - Clinic Note  11/11/2017     CHIEF COMPLAINT Patient presents for Retina Follow Up   HISTORY OF PRESENT ILLNESS: Carolyn Hunter is a 67 y.o. female who presents to the clinic today for:   HPI    Retina Follow Up    Patient presents with  Diabetic Retinopathy.  In both eyes.  This started 5 months ago.  Severity is mild.  Since onset it is gradually improving.  I, the attending physician,  performed the HPI with the patient and updated documentation appropriately.          Comments    F/U NPDR w/DME OU. Patient states she feels her vision has improved, she can see things more clearly. BS this am 110, next A1C due in May per patient. Pt reports Bs have been WINL. Pt is ready for Avastin today if needed.       Last edited by Rennis Chris, MD on 11/11/2017  1:13 PM. (History)    Pt states she feels OU VA is improving;    Referring physician: Aliene Beams, MD [address] [place], [place] [address]  HISTORICAL INFORMATION:   Selected notes from the MEDICAL RECORD NUMBER Referral from Dr. Fabienne Bruns for DM exam;  Ocular Hx-  PMH- DM; high chol; HTN; Last A1C- 7.1 (9.7.18);    CURRENT MEDICATIONS: No current outpatient medications on file. (Ophthalmic Drugs)   No current facility-administered medications for this visit.  (Ophthalmic Drugs)   Current Outpatient Medications (Other)  Medication Sig  . aspirin EC 81 MG tablet Take 1 tablet (81 mg total) by mouth daily.  Marland Kitchen lisinopril (PRINIVIL,ZESTRIL) 10 MG tablet TAKE 1 TABLET BY MOUTH ONCE DAILY  . metFORMIN (GLUCOPHAGE) 850 MG tablet Take 1 tablet (850 mg total) by mouth 2 (two) times daily with a meal.  . simvastatin (ZOCOR) 40 MG tablet Take 1 tablet (40 mg total) by mouth at bedtime.   Current Facility-Administered Medications (Other)  Medication Route  . Bevacizumab (AVASTIN) SOLN 1.25 mg Intravitreal  . Bevacizumab (AVASTIN) SOLN 1.25 mg Intravitreal  . Bevacizumab (AVASTIN) SOLN  1.25 mg Intravitreal  . Bevacizumab (AVASTIN) SOLN 1.25 mg Intravitreal      REVIEW OF SYSTEMS: ROS    Positive for: Neurological, Endocrine, Eyes   Negative for: Constitutional, Gastrointestinal, Skin, Genitourinary, Musculoskeletal, HENT, Cardiovascular, Respiratory, Psychiatric, Allergic/Imm, Heme/Lymph   Last edited by Eldridge Scot, LPN on 04/11/4781 12:58 PM. (History)       ALLERGIES No Known Allergies  PAST MEDICAL HISTORY Past Medical History:  Diagnosis Date  . Cataract   . Diabetes mellitus without complication (HCC)   . Hyperlipidemia   . Hypertension    Past Surgical History:  Procedure Laterality Date  . CESAREAN SECTION     1988    FAMILY HISTORY Family History  Problem Relation Age of Onset  . Cancer Mother        breast and colon cancer  . Diabetes Brother   . Diabetes Son   . Asthma Father        COPD  . Diabetes Son     SOCIAL HISTORY Social History   Tobacco Use  . Smoking status: Never Smoker  . Smokeless tobacco: Never Used  Substance Use Topics  . Alcohol use: No  . Drug use: No         OPHTHALMIC EXAM:  Base Eye Exam    Visual Acuity (Snellen - Linear)  Right Left   Dist [place] 20/40 +1 20/40 -2   Dist ph [place] NI NI       Tonometry (Tonopen, 12:59 PM)      Right Left   Pressure 17 21       Pupils      Dark Light Shape React APD   Right 3 2 Round Slow None   Left 3 2 Round Slow None       Visual Fields (Counting fingers)      Left Right    Full Full       Extraocular Movement      Right Left    Full, Ortho Full, Ortho       Neuro/Psych    Oriented x3:  Yes   Mood/Affect:  Normal       Dilation    Both eyes:  1.0% Mydriacyl, 2.5% Phenylephrine @ 1:00 PM        Slit Lamp and Fundus Exam    Slit Lamp Exam      Right Left   Lids/Lashes Dermatochalasis - upper lid, Meibomian gland dysfunction Dermatochalasis - upper lid, Meibomian gland dysfunction   Conjunctiva/Sclera White and quiet White and  quiet   Cornea Mild Arcus Mild Arcus, EBMD, 1+ Punctate epithelial erosions   Anterior Chamber Deep and quiet Deep and quiet   Iris Round and dilated, No NVI Round and dilated, No NVI   Lens 2-3+ Nuclear sclerosis, 2+ Cortical cataract 2-3+ Nuclear sclerosis, 2+ Cortical cataract   Vitreous Normal Normal       Fundus Exam      Right Left   Disc Normal, No NVD Normal, No NVD   C/D Ratio 0.4 0.4   Macula blunted foveal reflex with central cystic changes - slightly worse from prior, perifoveal exudates, MAs, exudates inefrotemporal to fovea blunted foveal reflex and trace cystic changes, exudates temporal to fovea, MAs   Vessels Tortuous, AV crossing changes, Copper wiring Tortuous with early veinous beading    Periphery Attached, scattered MAs, DBH, exudates inferior to disc - improving, CWS inferior to disc - improving attached, CWS nasally and inferiorly, DBH, exudates          IMAGING AND PROCEDURES  Imaging and Procedures for 11/11/17  OCT, Retina - OU - Both Eyes       Right Eye Quality was borderline. Central Foveal Thickness: 405. Progression has worsened. Findings include abnormal foveal contour, intraretinal hyper-reflective material, no SRF, intraretinal fluid (Interval increase in IRF / central cyst).   Left Eye Quality was borderline. Central Foveal Thickness: 324. Progression has worsened. Findings include abnormal foveal contour, intraretinal fluid, intraretinal hyper-reflective material, no SRF (Interval worsening of cystic changes and central foveal distortion).   Notes Images taken, stored on drive  Diagnosis / Impression:  DME OU (OD>OS) Interval worsening of IRF OU   Clinical management:  See below  Abbreviations: NFP - Normal foveal profile. CME - cystoid macular edema. PED - pigment epithelial detachment. IRF - intraretinal fluid. SRF - subretinal fluid. EZ - ellipsoid zone. ERM - epiretinal membrane. ORA - outer retinal atrophy. ORT - outer retinal  tubulation. SRHM - subretinal hyper-reflective material         Intravitreal Injection, Pharmacologic Agent - OD - Right Eye       Time Out 11/11/2017. 2:08 PM. Confirmed correct patient, procedure, site, and patient consented.   Anesthesia Topical anesthesia was used. Anesthetic medications included Lidocaine 2%, Tetracaine 0.5%.   Procedure Preparation included 5% betadine to  ocular surface, eyelid speculum. A supplied needle was used.   Injection: 1.25 mg Bevacizumab 1.25mg /0.77ml   NDC: 57846-962-95    Lot: [id number]     Expiration Date: 12/14/2017   Route: Intravitreal   Site: Right Eye   Waste: 0 mg  Post-op Post injection exam found visual acuity of at least counting fingers. The patient tolerated the procedure well. There were no complications. The patient received written and verbal post procedure care education.        Fluorescein Angiography Optos (Transit OD)       Right Eye Progression has no prior data. Early phase findings include microaneurysm. Mid/Late phase findings include microaneurysm, leakage.   Left Eye Progression has no prior data. Early phase findings include microaneurysm. Mid/Late phase findings include microaneurysm, leakage (Focal hyper fluorescence of vessels nasal to disc with mild perivascular leakage).   Notes Impression: Decreased hyperfluorescent signal OU impeding some interpretation  Microaneurysms with mild late leakage OU. OS with some mild late perivascular leakage nasal to disc  No neovascularization OU                  ASSESSMENT/PLAN:    ICD-10-CM   1. Moderate nonproliferative diabetic retinopathy of both eyes with macular edema associated with type 2 diabetes mellitus (HCC) E11.3313 OCT, Retina - OU - Both Eyes    Intravitreal Injection, Pharmacologic Agent - OD - Right Eye    Fluorescein Angiography Optos (Transit OD)    Bevacizumab (AVASTIN) SOLN 1.25 mg  2. Hypertensive retinopathy of both eyes H35.033    3. Combined forms of age-related cataract of both eyes H25.813     1. Moderate nonproliferative diabetic retinopathy, both eyes The incidence, risk factors for progression, natural history and treatment options for diabetic retinopathy  were discussed with patient.  The need for close monitoring of blood glucose, blood pressure, and serum lipids, avoiding cigarette or any type of tobacco, and the need for long term follow up was also discussed with patient. - no NV on exam or fluorescein angiogram - Diabetic macular edema, both eyes -- relatively mild - S/P IVA OD #1 (12.17.18), #2 (01.28.19), #3 (02.27.19) - today, interval increase in IRF OU on OCT - FA today didn't really show significant Mas amenable to focal laser -- will hold off on focal laser for now - BCVA stable at 20/40 OU - recommend IVA #4 OD today, (04.03.19) - pt wishes to proceed with treatment - f/u in 4 weeks with repeat DFE/OCT/possible injection  2. Hypertensive retinopathy OU - discussed importance of tight BP control - monitor  3. Nuclear and Cortical Cataracts OU;  - The symptoms of cataract, surgical options, and treatments and risks were discussed with patient. - discussed diagnosis and progression - not yet visually significant - monitor for now   Ophthalmic Meds Ordered this visit:  Meds ordered this encounter  Medications  . Bevacizumab (AVASTIN) SOLN 1.25 mg       Return in about 1 month (around 12/09/2017) for F/U NPDR OU, Dilated exam, OCT.  There are no Patient Instructions on file for this visit.   Explained the diagnoses, plan, and follow up with the patient and they expressed understanding.  Patient expressed understanding of the importance of proper follow up care.   This document serves as a record of services personally performed by Karie Chimera, MD, PhD. It was created on their behalf by Virgilio Belling, COA, a certified ophthalmic assistant. The creation of this record is the  provider's dictation  and/or activities during the visit.  Electronically signed by: Virgilio Belling, COA  11/11/17 5:06 PM    Karie Chimera, M.D., Ph.D. Diseases & Surgery of the Retina and Vitreous [facility] 11/11/17  I have reviewed the above documentation for accuracy and completeness, and I agree with the above. Karie Chimera, M.D., Ph.D. 11/11/17 5:06 PM    Abbreviations: M myopia (nearsighted); A astigmatism; H hyperopia (farsighted); P presbyopia; Mrx spectacle prescription;  CTL contact lenses; OD right eye; OS left eye; OU both eyes  XT exotropia; ET esotropia; PEK punctate epithelial keratitis; PEE punctate epithelial erosions; DES dry eye syndrome; MGD meibomian gland dysfunction; ATs artificial tears; PFAT's preservative free artificial tears; NSC nuclear sclerotic cataract; PSC posterior subcapsular cataract; ERM epi-retinal membrane; PVD posterior vitreous detachment; RD retinal detachment; DM diabetes mellitus; DR diabetic retinopathy; NPDR non-proliferative diabetic retinopathy; PDR proliferative diabetic retinopathy; CSME clinically significant macular edema; DME diabetic macular edema; dbh dot blot hemorrhages; CWS cotton wool spot; POAG primary open angle glaucoma; C/D cup-to-disc ratio; HVF humphrey visual field; GVF goldmann visual field; OCT optical coherence tomography; IOP intraocular pressure; BRVO Branch retinal vein occlusion; CRVO central retinal vein occlusion; CRAO central retinal artery occlusion; BRAO branch retinal artery occlusion; RT retinal tear; SB scleral buckle; PPV pars plana vitrectomy; VH Vitreous hemorrhage; PRP panretinal laser photocoagulation; IVK intravitreal kenalog; VMT vitreomacular traction; MH Macular hole;  NVD neovascularization of the disc; NVE neovascularization elsewhere; AREDS age related eye disease study; ARMD age related macular degeneration; POAG primary open angle glaucoma; EBMD epithelial/anterior basement  membrane dystrophy; ACIOL anterior chamber intraocular lens; IOL intraocular lens; PCIOL posterior chamber intraocular lens; Phaco/IOL phacoemulsification with intraocular lens placement; PRK photorefractive keratectomy; LASIK laser assisted in situ keratomileusis; HTN hypertension; DM diabetes mellitus; COPD chronic obstructive pulmonary disease

## 2017-11-11 ENCOUNTER — Ambulatory Visit (INDEPENDENT_AMBULATORY_CARE_PROVIDER_SITE_OTHER): Payer: BLUE CROSS/BLUE SHIELD | Admitting: Ophthalmology

## 2017-11-11 ENCOUNTER — Encounter (INDEPENDENT_AMBULATORY_CARE_PROVIDER_SITE_OTHER): Payer: Self-pay | Admitting: Ophthalmology

## 2017-11-11 DIAGNOSIS — E113313 Type 2 diabetes mellitus with moderate nonproliferative diabetic retinopathy with macular edema, bilateral: Secondary | ICD-10-CM

## 2017-11-11 DIAGNOSIS — H35033 Hypertensive retinopathy, bilateral: Secondary | ICD-10-CM | POA: Diagnosis not present

## 2017-11-11 DIAGNOSIS — H25813 Combined forms of age-related cataract, bilateral: Secondary | ICD-10-CM

## 2017-11-11 MED ORDER — BEVACIZUMAB CHEMO INJECTION 1.25MG/0.05ML SYRINGE FOR KALEIDOSCOPE
1.2500 mg | INTRAVITREAL | Status: DC
  Administered 2017-11-11: 1.25 mg via INTRAVITREAL

## 2017-12-08 NOTE — Progress Notes (Signed)
[facility] - Clinic Note  12/09/2017     CHIEF COMPLAINT Patient presents for Retina Follow Up   HISTORY OF PRESENT ILLNESS: Carolyn Hunter is a 67 y.o. female who presents to the clinic today for:   HPI    Retina Follow Up    Patient presents with  Diabetic Retinopathy.  In both eyes.  Severity is moderate.  Duration of 1 month.  Since onset it is stable.  I, the attending physician,  performed the HPI with the patient and updated documentation appropriately.          Comments    Pt presents today for NPDR OU F/U, pt states VA has been stable since last visit, pt denies any pain, flashes, floaters or wavy vision, pts BS was 89 this AM, pt will have A1C checked at the end of May, pt states she is tolerating injections well and is ready to receive one today if needed,        Last edited by Rennis Chris, MD on 12/09/2017  1:17 PM. (History)    Pt states she feels OU VA is improving;    Referring physician: Aliene Beams, MD [address] [place], [place] [address]  HISTORICAL INFORMATION:   Selected notes from the MEDICAL RECORD NUMBER Referral from Dr. Fabienne Bruns for DM exam;  Ocular Hx-  PMH- DM; high chol; HTN; Last A1C- 7.1 (9.7.18);    CURRENT MEDICATIONS: No current outpatient medications on file. (Ophthalmic Drugs)   No current facility-administered medications for this visit.  (Ophthalmic Drugs)   Current Outpatient Medications (Other)  Medication Sig  . aspirin EC 81 MG tablet Take 1 tablet (81 mg total) by mouth daily.  Marland Kitchen lisinopril (PRINIVIL,ZESTRIL) 10 MG tablet TAKE 1 TABLET BY MOUTH ONCE DAILY  . metFORMIN (GLUCOPHAGE) 850 MG tablet Take 1 tablet (850 mg total) by mouth 2 (two) times daily with a meal.  . simvastatin (ZOCOR) 40 MG tablet Take 1 tablet (40 mg total) by mouth at bedtime.   Current Facility-Administered Medications (Other)  Medication Route  . Bevacizumab (AVASTIN) SOLN 1.25 mg Intravitreal  . Bevacizumab (AVASTIN)  SOLN 1.25 mg Intravitreal  . Bevacizumab (AVASTIN) SOLN 1.25 mg Intravitreal  . Bevacizumab (AVASTIN) SOLN 1.25 mg Intravitreal  . Bevacizumab (AVASTIN) SOLN 1.25 mg Intravitreal      REVIEW OF SYSTEMS: ROS    Positive for: Endocrine, Cardiovascular, Eyes   Negative for: Constitutional, Gastrointestinal, Neurological, Skin, Genitourinary, Musculoskeletal, HENT, Respiratory, Psychiatric, Allergic/Imm, Heme/Lymph   Last edited by Posey Boyer, COT on 12/09/2017  1:14 PM. (History)       ALLERGIES No Known Allergies  PAST MEDICAL HISTORY Past Medical History:  Diagnosis Date  . Cataract   . Diabetes mellitus without complication (HCC)   . Hyperlipidemia   . Hypertension    Past Surgical History:  Procedure Laterality Date  . CESAREAN SECTION     1988    FAMILY HISTORY Family History  Problem Relation Age of Onset  . Cancer Mother        breast and colon cancer  . Diabetes Brother   . Diabetes Son   . Asthma Father        COPD  . Diabetes Son     SOCIAL HISTORY Social History   Tobacco Use  . Smoking status: Never Smoker  . Smokeless tobacco: Never Used  Substance Use Topics  . Alcohol use: No  . Drug use: No  OPHTHALMIC EXAM:  Base Eye Exam    Visual Acuity (Snellen - Linear)      Right Left   Dist [place] 20/40 +2 20/30 -2   Dist ph [place] NI NI       Tonometry (Tonopen, 1:19 PM)      Right Left   Pressure 14 17       Pupils      Dark Light Shape React APD   Right 4 2 Round Brisk None   Left 4 2 Round Brisk None       Visual Fields (Counting fingers)      Left Right    Full Full       Extraocular Movement      Right Left    Full, Ortho Full, Ortho       Neuro/Psych    Oriented x3:  Yes   Mood/Affect:  Normal       Dilation    Both eyes:  1.0% Mydriacyl, 2.5% Phenylephrine @ 1:20 PM        Slit Lamp and Fundus Exam    Slit Lamp Exam      Right Left   Lids/Lashes Dermatochalasis - upper lid, Meibomian gland dysfunction  Dermatochalasis - upper lid, Meibomian gland dysfunction   Conjunctiva/Sclera White and quiet White and quiet   Cornea Mild Arcus Mild Arcus, EBMD, 1+ Punctate epithelial erosions   Anterior Chamber Deep and quiet Deep and quiet   Iris Round and dilated, No NVI Round and dilated, No NVI   Lens 2-3+ Nuclear sclerosis, 2+ Cortical cataract 2-3+ Nuclear sclerosis, 2+ Cortical cataract   Vitreous Normal Normal       Fundus Exam      Right Left   Disc Normal, No NVD Normal, No NVD   C/D Ratio 0.4 0.4   Macula blunted foveal reflex with central cystic changes - improved from prior, perifoveal exudates, MAs, exudates inefrotemporal to fovea blunted foveal reflex and trace cystic changes, exudates temporal to fovea, MAs   Vessels Tortuous, AV crossing changes, Copper wiring Tortuous with early veinous beading    Periphery Attached, scattered MAs, DBH, exudates inferior to disc - improving, CWS inferior to disc - improving attached, CWS nasally and inferiorly, DBH, exudates          IMAGING AND PROCEDURES  Imaging and Procedures for 12/10/17  OCT, Retina - OU - Both Eyes       Right Eye Quality was borderline. Central Foveal Thickness: 403. Progression has improved. Findings include abnormal foveal contour, intraretinal hyper-reflective material, no SRF, intraretinal fluid (Interval improvement in IRF / central cyst).   Left Eye Quality was borderline. Central Foveal Thickness: 328. Progression has been stable. Findings include abnormal foveal contour, intraretinal fluid, intraretinal hyper-reflective material, no SRF (Interval improvement of cystic changes and central foveal distortion).   Notes Images taken, stored on drive  Diagnosis / Impression:  DME OU (OD>OS) Interval improvement of IRF OU   Clinical management:  See below  Abbreviations: NFP - Normal foveal profile. CME - cystoid macular edema. PED - pigment epithelial detachment. IRF - intraretinal fluid. SRF - subretinal  fluid. EZ - ellipsoid zone. ERM - epiretinal membrane. ORA - outer retinal atrophy. ORT - outer retinal tubulation. SRHM - subretinal hyper-reflective material         Intravitreal Injection, Pharmacologic Agent - OD - Right Eye       Time Out 12/09/2017. 12:00 AM. Confirmed correct patient, procedure, site, and patient consented.   Anesthesia  Topical anesthesia was used. Anesthetic medications included Lidocaine 2%, Tetracaine 0.5%.   Procedure Preparation included 5% betadine to ocular surface, eyelid speculum. A supplied needle was used.   Injection: 1.25 mg Bevacizumab 1.25mg /0.2ml   NDC: 16109-604-54    Lot: [id number]     Expiration Date: 01/23/2018   Route: Intravitreal   Site: Right Eye   Waste: 0 mg  Post-op Post injection exam found visual acuity of at least counting fingers. The patient tolerated the procedure well. There were no complications. The patient received written and verbal post procedure care education.                 ASSESSMENT/PLAN:    ICD-10-CM   1. Moderate nonproliferative diabetic retinopathy of both eyes with macular edema associated with type 2 diabetes mellitus (HCC) E11.3313 OCT, Retina - OU - Both Eyes    Intravitreal Injection, Pharmacologic Agent - OD - Right Eye    Bevacizumab (AVASTIN) SOLN 1.25 mg  2. Hypertensive retinopathy of both eyes H35.033   3. Combined forms of age-related cataract of both eyes H25.813     1. Moderate nonproliferative diabetic retinopathy, both eyes - Diabetic macular edema, both eyes -- relatively mild - S/P IVA OD #1 (12.17.18), #2 (01.28.19), #3 (02.27.19), #4 (04.03.19) - today, interval increase in IRF OU on OCT - FA at last visit did not show significant Mas amenable to focal laser -- will hold off on focal laser for now - BCVA stable at 20/40 OU, but OCT shows persistent IRF / DME - recommend IVA #5 OD today, (05.01.19) - pt wishes to proceed with treatment - f/u in 4 weeks with repeat  DFE/OCT/possible injection  2. Hypertensive retinopathy OU - discussed importance of tight BP control - monitor  3. Nuclear and Cortical Cataracts OU;  - The symptoms of cataract, surgical options, and treatments and risks were discussed with patient. - discussed diagnosis and progression - not yet visually significant - monitor for now   Ophthalmic Meds Ordered this visit:  Meds ordered this encounter  Medications  . Bevacizumab (AVASTIN) SOLN 1.25 mg       Return in about 1 month (around 01/06/2018) for F/U NPDR OU, DFE, OCT.  There are no Patient Instructions on file for this visit.   Explained the diagnoses, plan, and follow up with the patient and they expressed understanding.  Patient expressed understanding of the importance of proper follow up care.   This document serves as a record of services personally performed by Karie Chimera, MD, PhD. It was created on their behalf by Virgilio Belling, COA, a certified ophthalmic assistant. The creation of this record is the provider's dictation and/or activities during the visit.  Electronically signed by: Virgilio Belling, COA  12/10/17 10:14 PM   Karie Chimera, M.D., Ph.D. Diseases & Surgery of the Retina and Vitreous [facility] 12/10/17   I have reviewed the above documentation for accuracy and completeness, and I agree with the above. Karie Chimera, M.D., Ph.D. 12/10/17 10:17 PM    Abbreviations: M myopia (nearsighted); A astigmatism; H hyperopia (farsighted); P presbyopia; Mrx spectacle prescription;  CTL contact lenses; OD right eye; OS left eye; OU both eyes  XT exotropia; ET esotropia; PEK punctate epithelial keratitis; PEE punctate epithelial erosions; DES dry eye syndrome; MGD meibomian gland dysfunction; ATs artificial tears; PFAT's preservative free artificial tears; NSC nuclear sclerotic cataract; PSC posterior subcapsular cataract; ERM epi-retinal membrane; PVD posterior vitreous  detachment; RD retinal detachment;  DM diabetes mellitus; DR diabetic retinopathy; NPDR non-proliferative diabetic retinopathy; PDR proliferative diabetic retinopathy; CSME clinically significant macular edema; DME diabetic macular edema; dbh dot blot hemorrhages; CWS cotton wool spot; POAG primary open angle glaucoma; C/D cup-to-disc ratio; HVF humphrey visual field; GVF goldmann visual field; OCT optical coherence tomography; IOP intraocular pressure; BRVO Branch retinal vein occlusion; CRVO central retinal vein occlusion; CRAO central retinal artery occlusion; BRAO branch retinal artery occlusion; RT retinal tear; SB scleral buckle; PPV pars plana vitrectomy; VH Vitreous hemorrhage; PRP panretinal laser photocoagulation; IVK intravitreal kenalog; VMT vitreomacular traction; MH Macular hole;  NVD neovascularization of the disc; NVE neovascularization elsewhere; AREDS age related eye disease study; ARMD age related macular degeneration; POAG primary open angle glaucoma; EBMD epithelial/anterior basement membrane dystrophy; ACIOL anterior chamber intraocular lens; IOL intraocular lens; PCIOL posterior chamber intraocular lens; Phaco/IOL phacoemulsification with intraocular lens placement; [place] photorefractive keratectomy; LASIK laser assisted in situ keratomileusis; HTN hypertension; DM diabetes mellitus; COPD chronic obstructive pulmonary disease

## 2017-12-09 ENCOUNTER — Encounter (INDEPENDENT_AMBULATORY_CARE_PROVIDER_SITE_OTHER): Payer: Self-pay | Admitting: Ophthalmology

## 2017-12-09 ENCOUNTER — Ambulatory Visit (INDEPENDENT_AMBULATORY_CARE_PROVIDER_SITE_OTHER): Payer: BLUE CROSS/BLUE SHIELD | Admitting: Ophthalmology

## 2017-12-09 DIAGNOSIS — E113313 Type 2 diabetes mellitus with moderate nonproliferative diabetic retinopathy with macular edema, bilateral: Secondary | ICD-10-CM

## 2017-12-09 DIAGNOSIS — H25813 Combined forms of age-related cataract, bilateral: Secondary | ICD-10-CM

## 2017-12-09 DIAGNOSIS — H35033 Hypertensive retinopathy, bilateral: Secondary | ICD-10-CM

## 2017-12-09 MED ORDER — BEVACIZUMAB CHEMO INJECTION 1.25MG/0.05ML SYRINGE FOR KALEIDOSCOPE
1.2500 mg | INTRAVITREAL | Status: DC
  Administered 2017-12-09: 1.25 mg via INTRAVITREAL

## 2017-12-10 ENCOUNTER — Encounter (INDEPENDENT_AMBULATORY_CARE_PROVIDER_SITE_OTHER): Payer: Self-pay | Admitting: Ophthalmology

## 2017-12-29 LAB — LIPID PANEL
Cholesterol: 180 mg/dL (ref <200)
HDL: 47 mg/dL — ABNORMAL LOW (ref >50)
LDL Cholesterol (Calc): 110 mg/dL (calc) — ABNORMAL HIGH
NON-HDL CHOLESTEROL (CALC): 133 mg/dL — AB (ref <130)
TRIGLYCERIDES: 121 mg/dL (ref <150)
Total CHOL/HDL Ratio: 3.8 (calc) (ref <5.0)

## 2017-12-29 LAB — HEPATIC FUNCTION PANEL
AG RATIO: 1.4 (calc) (ref 1.0–2.5)
ALBUMIN MSPROF: 4.2 g/dL (ref 3.6–5.1)
ALT: 9 U/L (ref 6–29)
AST: 17 U/L (ref 10–35)
Alkaline phosphatase (APISO): 56 U/L (ref 33–130)
BILIRUBIN TOTAL: 0.6 mg/dL (ref 0.2–1.2)
Bilirubin, Direct: 0.1 mg/dL (ref 0.0–0.2)
GLOBULIN: 3.1 g/dL (ref 1.9–3.7)
Indirect Bilirubin: 0.5 mg/dL (calc) (ref 0.2–1.2)
Total Protein: 7.3 g/dL (ref 6.1–8.1)

## 2017-12-29 LAB — BASIC METABOLIC PANEL
BUN: 12 mg/dL (ref 7–25)
CO2: 31 mmol/L (ref 20–32)
CREATININE: 0.79 mg/dL (ref 0.50–0.99)
Calcium: 10.4 mg/dL (ref 8.6–10.4)
Chloride: 103 mmol/L (ref 98–110)
GLUCOSE: 113 mg/dL — AB (ref 65–99)
Potassium: 4.5 mmol/L (ref 3.5–5.3)
SODIUM: 140 mmol/L (ref 135–146)

## 2017-12-29 LAB — HEMOGLOBIN A1C
EAG (MMOL/L): 6.6 (calc)
HEMOGLOBIN A1C: 5.8 %{Hb} — AB (ref <5.7)
MEAN PLASMA GLUCOSE: 120 (calc)

## 2017-12-29 LAB — HEPATITIS C ANTIBODY
HEP C AB: NONREACTIVE
SIGNAL TO CUT-OFF: 0.07 (ref <1.00)

## 2017-12-31 ENCOUNTER — Encounter: Payer: Self-pay | Admitting: Family Medicine

## 2018-01-05 ENCOUNTER — Encounter: Payer: Self-pay | Admitting: Family Medicine

## 2018-01-05 ENCOUNTER — Other Ambulatory Visit (HOSPITAL_COMMUNITY)
Admission: RE | Admit: 2018-01-05 | Discharge: 2018-01-05 | Disposition: A | Discharge status: Home / Self Care | Payer: BLUE CROSS/BLUE SHIELD | Source: Ambulatory Visit | Attending: Family Medicine | Admitting: Family Medicine

## 2018-01-05 ENCOUNTER — Ambulatory Visit (INDEPENDENT_AMBULATORY_CARE_PROVIDER_SITE_OTHER): Payer: BLUE CROSS/BLUE SHIELD | Admitting: Family Medicine

## 2018-01-05 ENCOUNTER — Other Ambulatory Visit: Payer: Self-pay

## 2018-01-05 VITALS — BP 164/78 | HR 89 | Temp 98.2°F | Resp 16 | Ht 63.0 in | Wt 149.0 lb

## 2018-01-05 DIAGNOSIS — E1169 Type 2 diabetes mellitus with other specified complication: Secondary | ICD-10-CM | POA: Diagnosis not present

## 2018-01-05 DIAGNOSIS — E785 Hyperlipidemia, unspecified: Secondary | ICD-10-CM | POA: Diagnosis not present

## 2018-01-05 DIAGNOSIS — I10 Essential (primary) hypertension: Secondary | ICD-10-CM | POA: Diagnosis not present

## 2018-01-05 DIAGNOSIS — E119 Type 2 diabetes mellitus without complications: Secondary | ICD-10-CM | POA: Diagnosis not present

## 2018-01-05 DIAGNOSIS — Z23 Encounter for immunization: Secondary | ICD-10-CM

## 2018-01-05 DIAGNOSIS — Z0000 Encounter for general adult medical examination without abnormal findings: Secondary | ICD-10-CM | POA: Insufficient documentation

## 2018-01-05 MED ORDER — OLMESARTAN MEDOXOMIL 20 MG PO TABS: 10.0000 mg | ORAL_TABLET | Freq: Every day | ORAL | 0 refills | Status: DC

## 2018-01-05 MED ORDER — METFORMIN HCL 500 MG PO TABS: 500.0000 mg | ORAL_TABLET | Freq: Two times a day (BID) | ORAL | 3 refills | Status: DC

## 2018-01-05 MED ORDER — ROSUVASTATIN CALCIUM 20 MG PO TABS: 20.0000 mg | ORAL_TABLET | Freq: Every day | ORAL | 3 refills | Status: DC

## 2018-01-05 NOTE — Progress Notes (Signed)
[facility] - Clinic Note  01/06/2018     CHIEF COMPLAINT Patient presents for Retina Follow Up   HISTORY OF PRESENT ILLNESS: Carolyn Hunter is a 67 y.o. female who presents to the clinic today for:   HPI    Retina Follow Up    Patient presents with  Diabetic Retinopathy.  In both eyes.  This started 3 months ago.  Severity is mild.  Since onset it is gradually improving.  I, the attending physician,  performed the HPI with the patient and updated documentation appropriately.          Comments    F/U NPDR OU Patient states she feels her vision is gradually getting better.Bs 110 this am last A1C 5.8 (01/05/18). Patient is ready for tx today if indicated.        Last edited by Rennis Chris, MD on 01/06/2018  1:52 PM. (History)    Pt states she feels OU VA is improving;    Referring physician: Aliene Beams, MD [address] [place], [place] [address]  HISTORICAL INFORMATION:   Selected notes from the MEDICAL RECORD NUMBER Referral from Dr. Fabienne Bruns for DM exam;  Ocular Hx-  PMH- DM; high chol; HTN; Last A1C- 7.1 (9.7.18);    CURRENT MEDICATIONS: No current outpatient medications on file. (Ophthalmic Drugs)   Current Facility-Administered Medications (Ophthalmic Drugs)  Medication Route  . triamcinolone acetonide (TRIESENCE) 40 MG/ML subtenons injection 4 mg Intravitreal   Current Outpatient Medications (Other)  Medication Sig  . aspirin EC 81 MG tablet Take 1 tablet (81 mg total) by mouth daily.  . metFORMIN (GLUCOPHAGE) 500 MG tablet Take 1 tablet (500 mg total) by mouth 2 (two) times daily with a meal.  . olmesartan (BENICAR) 20 MG tablet Take 0.5 tablets (10 mg total) by mouth daily.  . rosuvastatin (CRESTOR) 20 MG tablet Take 1 tablet (20 mg total) by mouth daily.   No current facility-administered medications for this visit.  (Other)      REVIEW OF SYSTEMS: ROS    Positive for: Endocrine, Eyes   Negative for: Constitutional,  Gastrointestinal, Neurological, Skin, Genitourinary, Musculoskeletal, HENT, Cardiovascular, Respiratory, Psychiatric, Allergic/Imm, Heme/Lymph   Last edited by Eldridge Scot, LPN on 2/59/5638  1:22 PM. (History)       ALLERGIES No Known Allergies  PAST MEDICAL HISTORY Past Medical History:  Diagnosis Date  . Cataract   . Diabetes mellitus without complication (HCC)   . Hyperlipidemia   . Hypertension    Past Surgical History:  Procedure Laterality Date  . CESAREAN SECTION     1988    FAMILY HISTORY Family History  Problem Relation Age of Onset  . Cancer Mother        breast and colon cancer  . Diabetes Brother   . Diabetes Son   . Asthma Father        COPD  . Diabetes Son     SOCIAL HISTORY Social History   Tobacco Use  . Smoking status: Never Smoker  . Smokeless tobacco: Never Used  Substance Use Topics  . Alcohol use: No  . Drug use: No         OPHTHALMIC EXAM:  Base Eye Exam    Visual Acuity (Snellen - Linear)      Right Left   Dist [place] 20/40 20/40   Dist ph  NI NI       Tonometry (Tonopen, 1:23 PM)      Right  Left   Pressure 17 18       Pupils      Dark Light Shape React APD   Right 4 3 Round Slow None   Left 4 3 Round Slow None       Visual Fields (Counting fingers)      Left Right    Full        Extraocular Movement      Right Left    Full, Ortho Full, Ortho       Neuro/Psych    Oriented x3:  Yes   Mood/Affect:  Normal       Dilation    Both eyes:  1.0% Mydriacyl, Paremyd @ 1:23 PM        Slit Lamp and Fundus Exam    Slit Lamp Exam      Right Left   Lids/Lashes Dermatochalasis - upper lid, Meibomian gland dysfunction Dermatochalasis - upper lid, Meibomian gland dysfunction   Conjunctiva/Sclera White and quiet White and quiet   Cornea Mild Arcus Mild Arcus, EBMD, 1+ Punctate epithelial erosions   Anterior Chamber Deep and quiet Deep and quiet   Iris Round and dilated, No NVI Round and dilated, No NVI   Lens 3+  Nuclear sclerosis, 2+ Cortical cataract 2-3+ Nuclear sclerosis, 2+ Cortical cataract   Vitreous Normal Normal       Fundus Exam      Right Left   Disc Normal, No NVD Normal, No NVD   C/D Ratio 0.4 0.4   Macula blunted foveal reflex with central cystic changes - improved from prior, perifoveal exudates, MAs, exudates inefrotemporal to fovea blunted foveal reflex and trace cystic changes, exudates temporal to fovea, MAs   Vessels Tortuous, AV crossing changes, Copper wiring Tortuous with early veinous beading    Periphery Attached, scattered MAs, DBH, exudates inferior to disc - improving, attached, CWS nasally and inferiorly, DBH, exudates          IMAGING AND PROCEDURES  Imaging and Procedures for 12/10/17  OCT, Retina - OU - Both Eyes       Right Eye Quality was borderline. Central Foveal Thickness: 410. Progression has been stable. Findings include abnormal foveal contour, intraretinal hyper-reflective material, no SRF, intraretinal fluid.   Left Eye Quality was borderline. Central Foveal Thickness: 334. Progression has been stable. Findings include abnormal foveal contour, intraretinal fluid, intraretinal hyper-reflective material, no SRF (Interval improvement of cystic changes and central foveal distortion).   Notes Images taken, stored on drive  Diagnosis / Impression:  DME OU (OD>OS)   Clinical management:  See below  Abbreviations: NFP - Normal foveal profile. CME - cystoid macular edema. PED - pigment epithelial detachment. IRF - intraretinal fluid. SRF - subretinal fluid. EZ - ellipsoid zone. ERM - epiretinal membrane. ORA - outer retinal atrophy. ORT - outer retinal tubulation. SRHM - subretinal hyper-reflective material         Intravitreal Injection, Pharmacologic Agent - OD - Right Eye       Time Out 01/06/2018. 2:25 PM. Confirmed correct patient, procedure, site, and patient consented.   Anesthesia Topical anesthesia was used. Anesthetic medications  included Lidocaine 2%, Proparacaine 0.5%.   Procedure Preparation included 5% betadine to ocular surface, eyelid speculum. A 27 gauge needle was used.   Injection: 4 mg triamcinolone acetonide (TRIESENCE) 40 MG/ML subtenons injection 4 mg   NDC: 8119-1478-29    Lot: [id number]    Expiration Date: 07/11/2019   Route: Intravitreal   Site: Right Eye   Waste: .  9 mg  Post-op Post injection exam found visual acuity of at least counting fingers. The patient tolerated the procedure well. There were no complications. The patient received written and verbal post procedure care education.                 ASSESSMENT/PLAN:    ICD-10-CM   1. Moderate nonproliferative diabetic retinopathy of both eyes with macular edema associated with type 2 diabetes mellitus (HCC) E11.3313 OCT, Retina - OU - Both Eyes    Intravitreal Injection, Pharmacologic Agent - OD - Right Eye    triamcinolone acetonide (TRIESENCE) 40 MG/ML subtenons injection 4 mg  2. Hypertensive retinopathy of both eyes H35.033   3. Combined forms of age-related cataract of both eyes H25.813     1. Moderate nonproliferative diabetic retinopathy, both eyes - Diabetic macular edema, both eyes -- relatively mild - S/P IVA OD #1 (12.17.18), #2 (01.28.19), #3 (02.27.19), #4 (04.03.19), #5 (05.01.19) - today, persistent IRF OU on OCT - FA at last visit did not show significant Mas amenable to focal laser -- will hold off on focal laser for now - BCVA stable at 20/40 OU, but OCT shows persistent IRF / DME - in reviewing her case -- there has been minimal response to IVA in OCT or BCVA - discussed switching therapy to intravitreal steroid and pt wishes to try intravitreal Triesence - RBA of procedure discussed, questions answered - informed consent obtained and signed - see procedure note - f/u in 4 weeks with repeat DFE/OCT/possible injection  2. Hypertensive retinopathy OU - discussed importance of tight BP control - monitor  3.  Nuclear and Cortical Cataracts OU;  - The symptoms of cataract, surgical options, and treatments and risks were discussed with patient. - discussed diagnosis and progression - not yet visually significant - monitor for now   Ophthalmic Meds Ordered this visit:  Meds ordered this encounter  Medications  . triamcinolone acetonide (TRIESENCE) 40 MG/ML subtenons injection 4 mg       Return in about 1 month (around 02/03/2018).  There are no Patient Instructions on file for this visit.   Explained the diagnoses, plan, and follow up with the patient and they expressed understanding.  Patient expressed understanding of the importance of proper follow up care.   This document serves as a record of services personally performed by Karie Chimera, MD, PhD. It was created on their behalf by Virgilio Belling, COA, a certified ophthalmic assistant. The creation of this record is the provider's dictation and/or activities during the visit.  Electronically signed by: Virgilio Belling, COA  05.28.19 12:55 PM   Karie Chimera, M.D., Ph.D. Diseases & Surgery of the Retina and Vitreous [facility] 01/06/18  I have reviewed the above documentation for accuracy and completeness, and I agree with the above. Karie Chimera, M.D., Ph.D. 01/07/18 12:56 PM    Abbreviations: M myopia (nearsighted); A astigmatism; H hyperopia (farsighted); P presbyopia; Mrx spectacle prescription;  CTL contact lenses; OD right eye; OS left eye; OU both eyes  XT exotropia; ET esotropia; PEK punctate epithelial keratitis; PEE punctate epithelial erosions; DES dry eye syndrome; MGD meibomian gland dysfunction; ATs artificial tears; PFAT's preservative free artificial tears; NSC nuclear sclerotic cataract; PSC posterior subcapsular cataract; ERM epi-retinal membrane; PVD posterior vitreous detachment; RD retinal detachment; DM diabetes mellitus; DR diabetic retinopathy; NPDR non-proliferative diabetic  retinopathy; PDR proliferative diabetic retinopathy; CSME clinically significant macular edema; DME diabetic macular edema; dbh dot blot hemorrhages;  CWS cotton wool spot; POAG primary open angle glaucoma; C/D cup-to-disc ratio; HVF humphrey visual field; GVF goldmann visual field; OCT optical coherence tomography; IOP intraocular pressure; BRVO Branch retinal vein occlusion; CRVO central retinal vein occlusion; CRAO central retinal artery occlusion; BRAO branch retinal artery occlusion; RT retinal tear; SB scleral buckle; PPV pars plana vitrectomy; VH Vitreous hemorrhage; PRP panretinal laser photocoagulation; IVK intravitreal kenalog; VMT vitreomacular traction; MH Macular hole;  NVD neovascularization of the disc; NVE neovascularization elsewhere; AREDS age related eye disease study; ARMD age related macular degeneration; POAG primary open angle glaucoma; EBMD epithelial/anterior basement membrane dystrophy; ACIOL anterior chamber intraocular lens; IOL intraocular lens; PCIOL posterior chamber intraocular lens; Phaco/IOL phacoemulsification with intraocular lens placement; [place] photorefractive keratectomy; LASIK laser assisted in situ keratomileusis; HTN hypertension; DM diabetes mellitus; COPD chronic obstructive pulmonary disease

## 2018-01-05 NOTE — Patient Instructions (Signed)
Calcium 600 mg a day Vitamin D 2000 IU a day  Stop the simvastatin and start the crestor 20 mg at night Stop the lisinipril and start the benicar/olmesartan for BP in the morning  Call me in 2 weeks and if BP is running greater than 130//80 I will increase the dose of the benicar prior to you coming back in.   We will recheck your cholesterol in 3 months We will have to recheck potassium at follow up in 1 month b/c we are changing BP agent.   Get pneumonia shot today Due for eye exam in 07/2018

## 2018-01-05 NOTE — Progress Notes (Signed)
Patient ID: Carolyn Hunter, female    DOB: 1950-08-25, 67 y.o.   MRN: [id number]  Chief Complaint  Patient presents with  . Annual Exam    Allergies Patient has no known allergies.  Subjective:   Carolyn Hunter is a 67 y.o. female who presents to [facility] today.  HPI Carolyn Hunter presents for her complete physical examination.  She reports that she has been doing well.  She brings in her blood pressure readings from home they have all been under 130/80.  She does have a history of whitecoat hypertension with underlying hypertension.  She does take her lisinopril each day.  She reports that over the past several months she has noticed a cough with the lisinopril.  She reports a dry cough.  Nonproductive.  Does not have a history of allergies.  Denies reflux.  No postnasal drip.  Denies any shortness of breath or sputum production.  No hemoptysis.  No side effects with her medications otherwise.  She did come in and get labs drawn before her visit.  Her recent hemoglobin A1c was 5.8%.  Her cholesterol was not at goal but her LDL had decreased from 250 2 and LDL of 110.  She is unable to tolerate atorvastatin due to myalgias.  She would be willing to try the Crestor.  She has been working on her diet and exercise.  She reports her mood is good.  She goes to the dentist every 6 months.  Her vision is good.  She denies any problems.  Denies any chest pain, shortness of breath, swelling in her extremities.  She is here for her Pap, pelvic, and clinical breast exam today.  She denies any vaginal discharge or vaginal bleeding.  She has never had any abnormal Pap smears.   Past Medical History:  Diagnosis Date  . Cataract   . Diabetes mellitus without complication (HCC)   . Hyperlipidemia   . Hypertension     Past Surgical History:  Procedure Laterality Date  . CESAREAN SECTION     1988    Family History  Problem Relation Age of Onset  . Cancer Mother        breast and  colon cancer  . Diabetes Brother   . Diabetes Son   . Asthma Father        COPD  . Diabetes Son      Social History   Socioeconomic History  . Marital status: Married    Spouse name: Not on file  . Number of children: Not on file  . Years of education: Not on file  . Highest education level: Not on file  Occupational History  . Occupation: retired  Engineer, production  . Financial resource strain: Not on file  . Food insecurity:    Worry: Not on file    Inability: Not on file  . Transportation needs:    Medical: Not on file    Non-medical: Not on file  Tobacco Use  . Smoking status: Never Smoker  . Smokeless tobacco: Never Used  Substance and Sexual Activity  . Alcohol use: No  . Drug use: No  . Sexual activity: Yes    Birth control/protection: Post-menopausal    Comment: 67 years old  Lifestyle  . Physical activity:    Days per week: Not on file    Minutes per session: Not on file  . Stress: Not on file  Relationships  . Social connections:    Talks on  phone: Not on file    Gets together: Not on file    Attends religious service: Not on file    Active member of club or organization: Not on file    Attends meetings of clubs or organizations: Not on file    Relationship status: Not on file  Other Topics Concern  . 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 Eats all food groups. Exercises 30 min a day, four days a week.    Enjoys activities. Does not attend church. Watch television.    Current Outpatient Medications on File Prior to Visit  Medication Sig Dispense Refill  . aspirin EC 81 MG tablet Take 1 tablet (81 mg total) by mouth daily.    . [DISCONTINUED] simvastatin (ZOCOR) 40 MG tablet Take 1 tablet (40 mg total) by mouth at bedtime. 90 tablet 1   No current facility-administered medications on file prior to visit.     Review of Systems  Constitutional: Negative for activity change, appetite change,  chills, diaphoresis, fatigue, fever and unexpected weight change.  HENT: Negative for congestion, dental problem, drooling, ear discharge, ear pain, hearing loss, mouth sores, nosebleeds, sinus pressure, sinus pain, sneezing, sore throat, tinnitus, trouble swallowing and voice change.   Eyes: Negative for visual disturbance.  Respiratory: Positive for cough. Negative for chest tightness, shortness of breath, wheezing and stridor.        Dry cough.  Cardiovascular: Negative for chest pain, palpitations and leg swelling.  Gastrointestinal: Negative for abdominal pain, anal bleeding, blood in stool, constipation, diarrhea, nausea and vomiting.  Genitourinary: Negative for decreased urine volume, difficulty urinating, dyspareunia, dysuria, enuresis, flank pain, frequency, genital sores, hematuria, menstrual problem, pelvic pain, urgency, vaginal bleeding, vaginal discharge and vaginal pain.  Musculoskeletal: Negative for arthralgias, gait problem, joint swelling, myalgias, neck pain and neck stiffness.  Skin: Negative for color change, pallor, rash and wound.  Neurological: Negative for dizziness, seizures, syncope, speech difficulty, weakness, light-headedness and numbness.  Hematological: Negative for adenopathy.  Psychiatric/Behavioral: Negative for agitation, behavioral problems, confusion, decreased concentration, dysphoric mood, hallucinations, self-injury, sleep disturbance and suicidal ideas. The patient is not nervous/anxious and is not hyperactive.      Objective:   BP (!) 164/78 (BP Location: Left Arm, Patient Position: Sitting, Cuff Size: Normal)   Pulse 89   Temp 98.2 F (36.8 C) (Oral)   Resp 16   Ht  (1.6 m)   Wt 149 lb 0.6 oz (67.6 kg)   SpO2 99%   BMI 26.40 kg/m   Physical Exam  Constitutional: She is oriented to person, place, and time. She appears well-developed and well-nourished. No distress.  HENT:  Head: Normocephalic and atraumatic.  Right Ear: External ear  normal.  Left Ear: External ear normal.  Nose: Nose normal.  Mouth/Throat: Oropharynx is clear and moist. No oropharyngeal exudate.  Eyes: Pupils are equal, round, and reactive to light. Conjunctivae are normal. Right eye exhibits no discharge. No scleral icterus.  Neck: Normal range of motion. Neck supple. No JVD present. No tracheal deviation present. No thyromegaly present.  Cardiovascular: Normal rate, regular rhythm, normal heart sounds and intact distal pulses. Exam reveals no gallop and no friction rub.  No murmur heard. Pulmonary/Chest: Effort normal and breath sounds normal. No stridor. No respiratory distress. She has no wheezes. She has no rales. Right breast exhibits no inverted nipple, no mass, no nipple discharge, no skin change and no tenderness. Left breast exhibits  no inverted nipple, no mass, no nipple discharge, no skin change and no tenderness.  Abdominal: Soft. Bowel sounds are normal. She exhibits no distension and no mass. There is no tenderness. There is no guarding.  Genitourinary: Vagina normal and uterus normal. Rectal exam shows guaiac negative stool. No vaginal discharge found.  Musculoskeletal: Normal range of motion.  Lymphadenopathy:    She has no cervical adenopathy.  Neurological: She is alert and oriented to person, place, and time. She displays normal reflexes. No cranial nerve deficit. She exhibits normal muscle tone. Coordination normal.  Skin: Skin is warm and dry. Capillary refill takes less than 2 seconds.  Psychiatric: She has a normal mood and affect. Her behavior is normal. Judgment and thought content normal.  Nursing note and vitals reviewed.    Assessment and Plan  1. Well adult exam Dentist visits recommended every 6 months. Appropriate anticipatory guidance given and documented in chart. Mammogram recommended yearly. Pap smear, Pelvic, and clinical breast exam performed today.  2. Controlled type 2 diabetes mellitus without complication,  without long-term current use of insulin (HCC) This time will decrease metformin to 500 mg twice a day.  Her A1c is at 5.8%.  Plan on rechecking in 3 months. - Pneumococcal polysaccharide vaccine 23-valent greater than or equal to 2yo subcutaneous/IM - metFORMIN (GLUCOPHAGE) 500 MG tablet; Take 1 tablet (500 mg total) by mouth 2 (two) times daily with a meal.  Dispense: 180 tablet; Refill: 3  3. HTN, goal below 130/80 To follow-up in 1 month for blood pressure recheck.  She will call in 2 weeks and if blood pressure still elevated we will plan to increase the Benicar. Patient counseled in detail regarding the risks of medication. Told to call or return to clinic if develop any worrisome signs or symptoms. Patient voiced understanding.  DC lisinopril secondary to side effect of cough - olmesartan (BENICAR) 20 MG tablet; Take 0.5 tablets (10 mg total) by mouth daily.  Dispense: 45 tablet; Refill: 0  4. Hyperlipidemia associated with type 2 diabetes mellitus (HCC) Change to Crestor to get LDL to goal.  LDL goal of less than 70. Hyperlipidemia and the associated risk of ASCVD were discussed today. Primary vs. Secondary prevention of ASCVD were discussed and how it relates to patient morbidity, mortality, and quality of life. Shared decision making with patient including the risks of statins vs.benefits of ASCVD risk reduction discussed.  Risks of stains discussed including myopathy, rhabdomyoloysis, liver problems, increased risk of diabetes discussed. We discussed heart healthy diet, lifestyle modifications, risk factor modifications, and adherence to the recommended treatment plan. We discussed the need to periodically monitor lipid panel and liver function tests while on statin therapy.   - rosuvastatin (CRESTOR) 20 MG tablet; Take 1 tablet (20 mg total) by mouth daily.  Dispense: 90 tablet; Refill: 3  No follow-ups on file. Aliene Beams, MD 01/05/2018

## 2018-01-06 ENCOUNTER — Ambulatory Visit (INDEPENDENT_AMBULATORY_CARE_PROVIDER_SITE_OTHER): Payer: BLUE CROSS/BLUE SHIELD | Admitting: Ophthalmology

## 2018-01-06 DIAGNOSIS — E113313 Type 2 diabetes mellitus with moderate nonproliferative diabetic retinopathy with macular edema, bilateral: Secondary | ICD-10-CM

## 2018-01-06 DIAGNOSIS — H25813 Combined forms of age-related cataract, bilateral: Secondary | ICD-10-CM

## 2018-01-06 DIAGNOSIS — H35033 Hypertensive retinopathy, bilateral: Secondary | ICD-10-CM

## 2018-01-07 ENCOUNTER — Encounter (INDEPENDENT_AMBULATORY_CARE_PROVIDER_SITE_OTHER): Payer: Self-pay | Admitting: Ophthalmology

## 2018-01-07 DIAGNOSIS — E113313 Type 2 diabetes mellitus with moderate nonproliferative diabetic retinopathy with macular edema, bilateral: Secondary | ICD-10-CM | POA: Diagnosis not present

## 2018-01-07 LAB — CYTOLOGY - PAP
DIAGNOSIS: NEGATIVE
HPV (WINDOPATH): NOT DETECTED

## 2018-01-07 MED ORDER — TRIAMCINOLONE ACETONIDE 40 MG/ML IO SUSP
4.0000 mg | INTRAOCULAR | Status: DC
  Administered 2018-01-07: 4 mg via INTRAVITREAL

## 2018-01-13 ENCOUNTER — Encounter: Payer: Self-pay | Admitting: Family Medicine

## 2018-01-14 ENCOUNTER — Encounter: Payer: Self-pay | Admitting: Family Medicine

## 2018-01-14 ENCOUNTER — Telehealth: Payer: Self-pay

## 2018-01-14 NOTE — Telephone Encounter (Signed)
Patient states that she would prefer to stay on the simvastatin 40mg  and lisinopril 10mg . She feels they were working fine and the new medication you prescribed for her just have too many side effects.

## 2018-01-15 ENCOUNTER — Encounter: Payer: Self-pay | Admitting: Family Medicine

## 2018-01-19 NOTE — Telephone Encounter (Signed)
Please advise that if she would like to go back on the simvastatin 40 mg po qhs, that is fine. WE have discussed the benefits of LDL being at goal and risks vs. Benefits of medicaitons, etc at length. However, due to the cough she was having on the lisinipril, I do not recommend she switch back to an ACE inhibitor.  She should stay on the benicar or follow up to discuss. I will not prescribe her an ACE inhibitor due to the cough. Please let me know if she needs the simvastatin called in, if so, call in simvastatin 40 mg po qhs #90/zero refills.  Janine Limboachel H. Tracie HarrierHagler, MD

## 2018-01-20 ENCOUNTER — Telehealth: Payer: Self-pay

## 2018-01-20 MED ORDER — SIMVASTATIN 40 MG PO TABS: 40.0000 mg | ORAL_TABLET | Freq: Every day | ORAL | 0 refills | Status: DC

## 2018-01-20 NOTE — Telephone Encounter (Signed)
Spoke with patient and advised her of Dr.Hagler's recommendations with verbal understanding. Simvastatin 40mg  #90 no refills sent in to pharmacy

## 2018-01-20 NOTE — Telephone Encounter (Signed)
Simvastatin ordered per Dr.Hagler

## 2018-02-02 NOTE — Progress Notes (Signed)
[facility] - Clinic Note  02/03/2018     CHIEF COMPLAINT Patient presents for Retina Follow Up   HISTORY OF PRESENT ILLNESS: Carolyn Hunter is a 67 y.o. female who presents to the clinic today for:   HPI    Retina Follow Up    Patient presents with  Diabetic Retinopathy.  In both eyes.  Severity is moderate.  Duration of 4 weeks.  Since onset it is stable.  I, the attending physician,  performed the HPI with the patient and updated documentation appropriately.          Comments    Pt presents for NPDR OU F/U, pt states VA is about the same as last time, pt states she had a lot of floaters after the injection last time, but she denies flashes, pain or wavy vision, pt denies the use of gtts, pts BS was 106 this morning, pts A1C was 5.8 last month, pt states her medication was lowered at last visit       Last edited by Rennis Chris, MD on 02/03/2018  3:16 PM. (History)    Pt states she noticed a significant amount of floaters after list injection; Pt states she is unable to see any improvement in OD [place];    Referring physician: Aliene Beams, MD [address] [place], [place] [address]  HISTORICAL INFORMATION:   Selected notes from the MEDICAL RECORD NUMBER Referral from Dr. Fabienne Bruns for DM exam;  Ocular Hx-  PMH- DM; high chol; HTN; Last A1C- 7.1 (9.7.18);    CURRENT MEDICATIONS: No current outpatient medications on file. (Ophthalmic Drugs)   Current Facility-Administered Medications (Ophthalmic Drugs)  Medication Route  . triamcinolone acetonide (TRIESENCE) 40 MG/ML subtenons injection 4 mg Intravitreal   Current Outpatient Medications (Other)  Medication Sig  . aspirin EC 81 MG tablet Take 1 tablet (81 mg total) by mouth daily.  . metFORMIN (GLUCOPHAGE) 500 MG tablet Take 1 tablet (500 mg total) by mouth 2 (two) times daily with a meal.  . olmesartan (BENICAR) 20 MG tablet Take 0.5 tablets (10 mg total) by mouth daily.  . simvastatin (ZOCOR)  40 MG tablet Take 1 tablet (40 mg total) by mouth at bedtime.   No current facility-administered medications for this visit.  (Other)      REVIEW OF SYSTEMS: ROS    Positive for: Musculoskeletal, Cardiovascular, Eyes   Negative for: Constitutional, Gastrointestinal, Neurological, Skin, Genitourinary, HENT, Endocrine, Respiratory, Psychiatric, Allergic/Imm, Heme/Lymph   Last edited by Posey Boyer, COT on 02/03/2018  2:16 PM. (History)       ALLERGIES No Known Allergies  PAST MEDICAL HISTORY Past Medical History:  Diagnosis Date  . Cataract   . Diabetes mellitus without complication (HCC)   . Hyperlipidemia   . Hypertension    Past Surgical History:  Procedure Laterality Date  . CESAREAN SECTION     1988    FAMILY HISTORY Family History  Problem Relation Age of Onset  . Cancer Mother        breast and colon cancer  . Diabetes Brother   . Diabetes Son   . Asthma Father        COPD  . Diabetes Son     SOCIAL HISTORY Social History   Tobacco Use  . Smoking status: Never Smoker  . Smokeless tobacco: Never Used  Substance Use Topics  . Alcohol use: No  . Drug use: No  OPHTHALMIC EXAM:  Base Eye Exam    Visual Acuity (Snellen - Linear)      Right Left   Dist [place] 20/50 -1 20/30 -2   Dist ph [place] 20/40 20/30       Tonometry (Tonopen, 2:21 PM)      Right Left   Pressure 20 18       Pupils      Dark Light Shape React APD   Right 2 1.5 Round Minimal None   Left 2 1.5 Round Minimal None       Visual Fields (Counting fingers)      Left Right    Full Full       Extraocular Movement      Right Left    Full, Ortho Full, Ortho       Neuro/Psych    Oriented x3:  Yes   Mood/Affect:  Normal       Dilation    Both eyes:  1.0% Mydriacyl, 2.5% Phenylephrine @ 2:21 PM        Slit Lamp and Fundus Exam    Slit Lamp Exam      Right Left   Lids/Lashes Dermatochalasis - upper lid, Meibomian gland dysfunction Dermatochalasis - upper lid,  Meibomian gland dysfunction   Conjunctiva/Sclera White and quiet White and quiet   Cornea Mild Arcus Mild Arcus, EBMD, 1+ Punctate epithelial erosions   Anterior Chamber Deep and quiet Deep and quiet   Iris Round and dilated, No NVI Round and dilated, No NVI   Lens 3+ Nuclear sclerosis, 2+ Cortical cataract 2-3+ Nuclear sclerosis, 2+ Cortical cataract   Vitreous Normal Normal       Fundus Exam      Right Left   Disc Normal, No NVD Normal, No NVD   C/D Ratio 0.4 0.4   Macula blunted foveal reflex with central cystic changes - improved from prior, perifoveal exudates, MAs, exudates inferotemporal to fovea - improved from prior blunted foveal reflex and trace cystic changes, exudates temporal to fovea, Mas   Vessels Tortuous, AV crossing changes, Copper wiring Tortuous with early veinous beading    Periphery Attached, scattered MAs, DBH, exudates inferior to disc - improving attached, CWS nasally and inferiorly, DBH, exudates          IMAGING AND PROCEDURES  Imaging and Procedures for 12/10/17  OCT, Retina - OU - Both Eyes       Right Eye Quality was borderline. Central Foveal Thickness: 373. Progression has been stable. Findings include abnormal foveal contour, intraretinal hyper-reflective material, no SRF, intraretinal fluid (Mild interval improvement in IRF).   Left Eye Quality was borderline. Central Foveal Thickness: 330. Progression has been stable. Findings include abnormal foveal contour, intraretinal fluid, intraretinal hyper-reflective material, no SRF (Mild interval improvement of cystic changes and central foveal distortion).   Notes Images taken, stored on drive  Diagnosis / Impression:  DME OU (OD>OS) - mild improvement in IRF OD   Clinical management:  See below  Abbreviations: NFP - Normal foveal profile. CME - cystoid macular edema. PED - pigment epithelial detachment. IRF - intraretinal fluid. SRF - subretinal fluid. EZ - ellipsoid zone. ERM - epiretinal  membrane. ORA - outer retinal atrophy. ORT - outer retinal tubulation. SRHM - subretinal hyper-reflective material                  ASSESSMENT/PLAN:    ICD-10-CM   1. Moderate nonproliferative diabetic retinopathy of both eyes with macular edema associated with type 2 diabetes  mellitus (HCC) Z61.0960   2. Hypertensive retinopathy of both eyes H35.033   3. 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 Retinal edema H35.81 OCT, Retina - OU - Both Eyes    1. Moderate nonproliferative diabetic retinopathy, both eyes - Diabetic macular edema, both eyes -- relatively mild - S/P IVA OD #1 (12.17.18), #2 (01.28.19), #3 (02.27.19), #4 (04.03.19), #5 (05.01.19) - S/P IVT OD (05.29.19) - today, persistent but improved IRF OD on OCT - FA at prior visit did not show significant Mas amenable to focal laser -- will hold off on focal laser for now - BCVA stable at 20/40 OD, 20/30 OS - in reviewing her case, there has been minimal response to IVA in OCT or BCVA - today, good early response to IVTA 4 wks ago - f/u 3-4 weeks, DFE, OCT, possible repeat injxn  2. Hypertensive retinopathy OU - discussed importance of tight BP control - monitor  3. Nuclear and Cortical Cataracts OU;  - The symptoms of cataract, surgical options, and treatments and risks were discussed with patient. - discussed diagnosis and progression - not yet visually significant - monitor for now  4. Retinal edema as above   Ophthalmic Meds Ordered this visit:  No orders of the defined types were placed in this encounter.      Return in about 1 month (around 03/03/2018) for F/U NPDR OU, DFE, OCT.  There are no Patient Instructions on file for this visit.   Explained the diagnoses, plan, and follow up with the patient and they expressed understanding.  Patient expressed understanding of the importance of proper follow up care.   This document serves as a record of services personally performed  by Karie Chimera, MD, PhD. It was created on their behalf by Laurian Brim, OA, an ophthalmic assistant. The creation of this record is the provider's dictation and/or activities during the visit.    Electronically signed by: Laurian Brim, OA  06.25.2019 11:03 PM   This document serves as a record of services personally performed by Karie Chimera, MD, PhD. It was created on their behalf by Virgilio Belling, COA, a certified ophthalmic assistant. The creation of this record is the provider's dictation and/or activities during the visit.  Electronically signed by: Virgilio Belling, COA  06.26.19 11:03 PM    Karie Chimera, M.D., Ph.D. Diseases & Surgery of the Retina and Vitreous [facility]   I have reviewed the above documentation for accuracy and completeness, and I agree with the above. Karie Chimera, M.D., Ph.D. 02/07/18 11:12 PM   Abbreviations: M myopia (nearsighted); A astigmatism; H hyperopia (farsighted); P presbyopia; Mrx spectacle prescription;  CTL contact lenses; OD right eye; OS left eye; OU both eyes  XT exotropia; ET esotropia; PEK punctate epithelial keratitis; PEE punctate epithelial erosions; DES dry eye syndrome; MGD meibomian gland dysfunction; ATs artificial tears; PFAT's preservative free artificial tears; NSC nuclear sclerotic cataract; PSC posterior subcapsular cataract; ERM epi-retinal membrane; PVD posterior vitreous detachment; RD retinal detachment; DM diabetes mellitus; DR diabetic retinopathy; NPDR non-proliferative diabetic retinopathy; PDR proliferative diabetic retinopathy; CSME clinically significant macular edema; DME diabetic macular edema; dbh dot blot hemorrhages; CWS cotton wool spot; POAG primary open angle glaucoma; C/D cup-to-disc ratio; HVF humphrey visual field; GVF goldmann visual field; OCT optical coherence tomography; IOP intraocular pressure; BRVO Branch retinal vein occlusion; CRVO central retinal vein occlusion; CRAO  central retinal artery occlusion; BRAO branch retinal artery occlusion; RT retinal  tear; SB scleral buckle; PPV pars plana vitrectomy; VH Vitreous hemorrhage; PRP panretinal laser photocoagulation; IVK intravitreal kenalog; VMT vitreomacular traction; MH Macular hole;  NVD neovascularization of the disc; NVE neovascularization elsewhere; AREDS age related eye disease study; ARMD age related macular degeneration; POAG primary open angle glaucoma; EBMD epithelial/anterior basement membrane dystrophy; ACIOL anterior chamber intraocular lens; IOL intraocular lens; PCIOL posterior chamber intraocular lens; Phaco/IOL phacoemulsification with intraocular lens placement; [place] photorefractive keratectomy; LASIK laser assisted in situ keratomileusis; HTN hypertension; DM diabetes mellitus; COPD chronic obstructive pulmonary disease

## 2018-02-03 ENCOUNTER — Ambulatory Visit (INDEPENDENT_AMBULATORY_CARE_PROVIDER_SITE_OTHER): Payer: BLUE CROSS/BLUE SHIELD | Admitting: Ophthalmology

## 2018-02-03 ENCOUNTER — Encounter (INDEPENDENT_AMBULATORY_CARE_PROVIDER_SITE_OTHER): Payer: Self-pay | Admitting: Ophthalmology

## 2018-02-03 DIAGNOSIS — H3581 Retinal edema: Secondary | ICD-10-CM | POA: Diagnosis not present

## 2018-02-03 DIAGNOSIS — E113313 Type 2 diabetes mellitus with moderate nonproliferative diabetic retinopathy with macular edema, bilateral: Secondary | ICD-10-CM | POA: Diagnosis not present

## 2018-02-03 DIAGNOSIS — H25813 Combined forms of age-related cataract, bilateral: Secondary | ICD-10-CM

## 2018-02-03 DIAGNOSIS — H35033 Hypertensive retinopathy, bilateral: Secondary | ICD-10-CM | POA: Diagnosis not present

## 2018-02-07 ENCOUNTER — Encounter (INDEPENDENT_AMBULATORY_CARE_PROVIDER_SITE_OTHER): Payer: Self-pay | Admitting: Ophthalmology

## 2018-02-08 ENCOUNTER — Other Ambulatory Visit: Payer: Self-pay | Admitting: Family Medicine

## 2018-02-08 DIAGNOSIS — I10 Essential (primary) hypertension: Secondary | ICD-10-CM

## 2018-02-12 ENCOUNTER — Ambulatory Visit: Payer: BLUE CROSS/BLUE SHIELD | Admitting: Family Medicine

## 2018-02-17 ENCOUNTER — Other Ambulatory Visit: Payer: Self-pay

## 2018-02-17 ENCOUNTER — Ambulatory Visit: Payer: BLUE CROSS/BLUE SHIELD | Admitting: Family Medicine

## 2018-02-17 ENCOUNTER — Encounter: Payer: Self-pay | Admitting: Family Medicine

## 2018-02-17 VITALS — BP 132/80 | HR 96 | Temp 98.2°F | Resp 18 | Ht 62.0 in | Wt 146.1 lb

## 2018-02-17 DIAGNOSIS — E119 Type 2 diabetes mellitus without complications: Secondary | ICD-10-CM | POA: Diagnosis not present

## 2018-02-17 DIAGNOSIS — E782 Mixed hyperlipidemia: Secondary | ICD-10-CM | POA: Diagnosis not present

## 2018-02-17 DIAGNOSIS — I10 Essential (primary) hypertension: Secondary | ICD-10-CM

## 2018-02-17 DIAGNOSIS — Z79899 Other long term (current) drug therapy: Secondary | ICD-10-CM

## 2018-02-17 MED ORDER — LOSARTAN POTASSIUM 50 MG PO TABS: 50.0000 mg | ORAL_TABLET | Freq: Every day | ORAL | 0 refills | Status: DC

## 2018-02-17 NOTE — Progress Notes (Signed)
Patient ID: Carolyn Hunter, female    DOB: 1950-10-21, 67 y.o.   MRN: [id number]  Chief Complaint  Patient presents with  . Diabetes    follow up  . Hypertension    Allergies Patient has no known allergies.  Subjective:   Carolyn Hunter is a 67 y.o. female who presents to [facility] today.  HPI [name] presents for follow-up visit today.  She is accompanied by her husband.  She would like to discuss the Benicar.  She reports that she read the warnings and possible side effects on the medication information that was supplied by the pharmacy and she does not want to take it.  She reports that she read that this medication can make her blood sugar levels increase and she has been working very hard to get them under control.  She wishes to have another medication for her blood pressure.  She has previously been on lisinopril but had stopped it because it caused a cough.  The cough did resolve with cessation of the medication.  However, she did start the lisinopril back because she did not want to be on the Benicar.  She comes in today to discuss this and to get a different prescription medication if I do not believe she should take the lisinopril.  She reports she is doing well.  Denies any fatigue.  Sleeping well.  Denies chest pain, shortness of breath, swelling in her extremities.  Blood sugars are running well.  Continue to exercise and eat a healthy diet.  Taking her aspirin as directed.  Is on simvastatin 40 mg p.o. nightly.  Does not wish to increase her dose.  She has had a significant improvement in her cholesterol values while on this medication.  Denies any myalgias.  Is taking her metformin as directed.  No hypoglycemic episodes.  No lesions on her feet.  She has had her pneumonia shots.  Vision exam up-to-date.   Past Medical History:  Diagnosis Date  . Cataract   . Diabetes mellitus without complication (HCC)   . Hyperlipidemia   . Hypertension     Past Surgical  History:  Procedure Laterality Date  . CESAREAN SECTION     1988    Family History  Problem Relation Age of Onset  . Cancer Mother        breast and colon cancer  . Diabetes Brother   . Diabetes Son   . Asthma Father        COPD  . Diabetes Son      Social History   Socioeconomic History  . Marital status: Married    Spouse name: Not on file  . Number of children: Not on file  . Years of education: Not on file  . Highest education level: Not on file  Occupational History  . Occupation: retired  Engineer, production  . Financial resource strain: Not on file  . Food insecurity:    Worry: Not on file    Inability: Not on file  . Transportation needs:    Medical: Not on file    Non-medical: Not on file  Tobacco Use  . Smoking status: Never Smoker  . Smokeless tobacco: Never Used  Substance and Sexual Activity  . Alcohol use: No  . Drug use: No  . Sexual activity: Yes    Birth control/protection: Post-menopausal    Comment: 67 years old  Lifestyle  . Physical activity:    Days per week: Not on file  Minutes per session: Not on file  . Stress: Not on file  Relationships  . Social connections:    Talks on phone: Not on file    Gets together: Not on file    Attends religious service: Not on file    Active member of club or organization: Not on file    Attends meetings of clubs or organizations: Not on file    Relationship status: Not on file  Other Topics Concern  . 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 Eats all food groups. Exercises 30 min a day, four days a week.    Enjoys activities. Does not attend church. Watch television.    Current Outpatient Medications on File Prior to Visit  Medication Sig Dispense Refill  . aspirin EC 81 MG tablet Take 1 tablet (81 mg total) by mouth daily.    . metFORMIN (GLUCOPHAGE) 500 MG tablet Take 1 tablet (500 mg total) by mouth 2 (two) times daily with a meal. 180 tablet  3  . simvastatin (ZOCOR) 40 MG tablet Take 1 tablet (40 mg total) by mouth at bedtime. 90 tablet 0   Current Facility-Administered Medications on File Prior to Visit  Medication Dose Route Frequency Provider Last Rate Last Dose  . triamcinolone acetonide (TRIESENCE) 40 MG/ML subtenons injection 4 mg  4 mg Intravitreal  Rennis Chris, MD   4 mg at 01/07/18 1248    Review of Systems  Constitutional: Negative for activity change, appetite change and fever.  Eyes: Negative for visual disturbance.  Respiratory: Positive for cough. Negative for chest tightness and shortness of breath.   Cardiovascular: Negative for chest pain, palpitations and leg swelling.  Gastrointestinal: Negative for abdominal pain, nausea and vomiting.  Genitourinary: Negative for dysuria, frequency and urgency.  Neurological: Negative for dizziness, syncope and light-headedness.  Hematological: Negative for adenopathy.  Psychiatric/Behavioral: Negative for dysphoric mood. The patient is not nervous/anxious.      Objective:   BP 132/80 (BP Location: Left Arm, Patient Position: Sitting, Cuff Size: Normal)   Pulse 96   Temp 98.2 F (36.8 C) (Temporal)   Resp 18   Ht 5\' 2"  (1.575 m)   Wt 146 lb 1.9 oz (66.3 kg)   SpO2 100%   BMI 26.73 kg/m   Physical Exam  Constitutional: She is oriented to person, place, and time. She appears well-developed and well-nourished. No distress.  HENT:  Head: Normocephalic and atraumatic.  Mouth/Throat: Oropharynx is clear and moist.  Eyes: Pupils are equal, round, and reactive to light.  Neck: Normal range of motion. Neck supple. No thyromegaly present.  Cardiovascular: Normal rate, regular rhythm and normal heart sounds.  Pulmonary/Chest: Effort normal and breath sounds normal. No respiratory distress.  Neurological: She is alert and oriented to person, place, and time. No cranial nerve deficit.  Skin: Skin is warm and dry.  Psychiatric: She has a normal mood and affect. Her  behavior is normal. Thought content normal.  Nursing note and vitals reviewed.    Assessment and Plan  1. HTN, goal below 130/80 We will discontinue lisinopril at this time.  Start losartan 50 mg p.o. daily.  She will not start the Benicar.  She has reservations about possible side effects of Benicar after reading the information.  The mechanism of action, risks, benefits, monitoring, and possible side effects of losartan discussed with patient.  She will follow-up in 4 to 6 weeks for recheck of labs.  Continue  with diet, exercise, and salt restriction. - losartan (COZAAR) 50 MG tablet; Take 1 tablet (50 mg total) by mouth daily.  Dispense: 90 tablet; Refill: 0  2. Mixed hyperlipidemia Patient defers increasing her statin medication at this time.  We did discuss that her LDL goal is less than 70.  She understands but does not wish to start this increased dose of medication until after she sees if her dietary changes have decreased her LDL to goal.  We will plan to recheck her LDL/lipid panel at the end of August. - Lipid panel  3. High risk medication use Check labs prior to follow-up visit secondary to initiation of ARB. - Basic metabolic panel  4. Controlled type 2 diabetes mellitus without complication, without long-term current use of insulin (HCC) Continue with diet, exercise, and weight loss modifications.  She is currently taking metformin as directed.  Check A1c at the end of August. - Hemoglobin A1c  No follow-ups on file. Aliene Beamsachel Blaire Hodsdon, MD 02/17/2018

## 2018-03-02 NOTE — Progress Notes (Signed)
[facility] - Clinic Note  03/03/2018     CHIEF COMPLAINT Patient presents for Retina Follow Up   HISTORY OF PRESENT ILLNESS: Carolyn Hunter is a 67 y.o. female who presents to the clinic today for:   HPI    Retina Follow Up    Patient presents with  Diabetic Retinopathy.  In both eyes.  This started 3 months ago.  Severity is mild.  Since onset it is stable.  I, the attending physician,  performed the HPI with the patient and updated documentation appropriately.          Comments    F/U NPDR OU. Patient states her viion is about the same , no new visual onsets. Bs 110 , Bs have been WINL, A1C due next month per patient.Patient ready for tx if indicted.       Last edited by Rennis Chris, MD on 03/03/2018  2:11 PM. (History)    Pt states she noticed a significant amount of floaters after list injection; Pt states she is unable to see any improvement in OD [place];    Referring physician: Aliene Beams, MD [address] [place], [place] [address]  HISTORICAL INFORMATION:   Selected notes from the MEDICAL RECORD NUMBER Referral from Dr. Fabienne Bruns for DM exam;  Ocular Hx-  PMH- DM; high chol; HTN; Last A1C- 7.1 (9.7.18);    CURRENT MEDICATIONS: No current outpatient medications on file. (Ophthalmic Drugs)   Current Facility-Administered Medications (Ophthalmic Drugs)  Medication Route  . triamcinolone acetonide (TRIESENCE) 40 MG/ML subtenons injection 4 mg Intravitreal  . triamcinolone acetonide (TRIESENCE) 40 MG/ML subtenons injection 4 mg Intravitreal   Current Outpatient Medications (Other)  Medication Sig  . aspirin EC 81 MG tablet Take 1 tablet (81 mg total) by mouth daily.  Marland Kitchen losartan (COZAAR) 50 MG tablet Take 1 tablet (50 mg total) by mouth daily.  . metFORMIN (GLUCOPHAGE) 500 MG tablet Take 1 tablet (500 mg total) by mouth 2 (two) times daily with a meal.  . olmesartan (BENICAR) 20 MG tablet Take 20 mg by mouth daily.  . simvastatin (ZOCOR)  40 MG tablet Take 1 tablet (40 mg total) by mouth at bedtime.   No current facility-administered medications for this visit.  (Other)      REVIEW OF SYSTEMS: ROS    Positive for: Endocrine, Eyes   Negative for: Constitutional, Gastrointestinal, Neurological, Skin, Genitourinary, Musculoskeletal, HENT, Cardiovascular, Respiratory, Psychiatric, Allergic/Imm, Heme/Lymph   Last edited by Eldridge Scot, LPN on 2/95/6213  2:01 PM. (History)       ALLERGIES No Known Allergies  PAST MEDICAL HISTORY Past Medical History:  Diagnosis Date  . Cataract   . Diabetes mellitus without complication (HCC)   . Hyperlipidemia   . Hypertension    Past Surgical History:  Procedure Laterality Date  . CESAREAN SECTION     1988    FAMILY HISTORY Family History  Problem Relation Age of Onset  . Cancer Mother        breast and colon cancer  . Diabetes Brother   . Diabetes Son   . Asthma Father        COPD  . Diabetes Son     SOCIAL HISTORY Social History   Tobacco Use  . Smoking status: Never Smoker  . Smokeless tobacco: Never Used  Substance Use Topics  . Alcohol use: No  . Drug use: No         OPHTHALMIC EXAM:  Base  Eye Exam    Visual Acuity (Snellen - Linear)      Right Left   Dist [place] 20/40 20/30   Dist ph [place] NI NI       Tonometry (Tonopen, 1:57 PM)      Right Left   Pressure 14 17       Pupils      Dark Light Shape React APD   Right 3 2 Irregular Minimal None   Left 3 2 Irregular Sluggish None       Visual Fields (Counting fingers)      Left Right    Full Full       Extraocular Movement      Right Left    Full, Ortho Full, Ortho       Neuro/Psych    Oriented x3:  Yes   Mood/Affect:  Normal       Dilation    Both eyes:  1.0% Mydriacyl, 2.5% Phenylephrine @ 1:58 PM        Slit Lamp and Fundus Exam    Slit Lamp Exam      Right Left   Lids/Lashes Dermatochalasis - upper lid, Meibomian gland dysfunction Dermatochalasis - upper lid,  Meibomian gland dysfunction   Conjunctiva/Sclera White and quiet White and quiet   Cornea Mild Arcus Mild Arcus, EBMD, 1+ Punctate epithelial erosions   Anterior Chamber Deep and quiet Deep and quiet   Iris Round and dilated, No NVI Round and dilated, No NVI   Lens 3+ Nuclear sclerosis, 2+ Cortical cataract 2-3+ Nuclear sclerosis, 2+ Cortical cataract   Vitreous Normal Normal       Fundus Exam      Right Left   Disc Normal, No NVD Normal, No NVD   C/D Ratio 0.4 0.4   Macula blunted foveal reflex with persistent central cystic changes, perifoveal exudates, MAs, exudates superior and supratemporal to fovea blunted foveal reflex and trace cystic changes, exudates temporal to fovea, Mas   Vessels Tortuous, AV crossing changes, Copper wiring Tortuous with early veinous beading    Periphery Attached, scattered MAs, DBH, exudates inferior to disc - improving attached, CWS nasally and inferiorly, DBH, exudates          IMAGING AND PROCEDURES  Imaging and Procedures for 12/10/17  OCT, Retina - OU - Both Eyes       Right Eye Quality was borderline. Central Foveal Thickness: 364. Progression has improved. Findings include abnormal foveal contour, intraretinal hyper-reflective material, no SRF, intraretinal fluid (Mild improvement in foveal contour and persistent IRF).   Left Eye Quality was borderline. Central Foveal Thickness: 330. Progression has been stable. Findings include abnormal foveal contour, intraretinal fluid, intraretinal hyper-reflective material, no SRF (Persistent cystic changes and central foveal distortion).   Notes Images taken, stored on drive  Diagnosis / Impression:  DME OU (OD>OS) OD: mild improvement in foveal contour; persistent IRF OS: stable   Clinical management:  See below  Abbreviations: NFP - Normal foveal profile. CME - cystoid macular edema. PED - pigment epithelial detachment. IRF - intraretinal fluid. SRF - subretinal fluid. EZ - ellipsoid zone.  ERM - epiretinal membrane. ORA - outer retinal atrophy. ORT - outer retinal tubulation. SRHM - subretinal hyper-reflective material         Intravitreal Injection, Pharmacologic Agent - OD - Right Eye       Time Out 03/03/2018. 2:17 PM. Confirmed correct patient, procedure, site, and patient consented.   Anesthesia Anesthetic medications included Lidocaine 2%, Tetracaine 0.5%.  Procedure Preparation included 5% betadine to ocular surface, eyelid speculum. A 27 gauge needle was used.   Injection: 4 mg triamcinolone acetonide (TRIESENCE) 40 MG/ML subtenons injection 4 mg   NDC: [id number]    Lot: [id number]    Expiration Date: 08/10/2019   Route: Intravitreal   Site: Right Eye   Waste: .9 mL  Post-op Post injection exam found visual acuity of at least counting fingers. The patient tolerated the procedure well. There were no complications. The patient received written and verbal post procedure care education.                 ASSESSMENT/PLAN:    ICD-10-CM   1. Moderate nonproliferative diabetic retinopathy of both eyes with macular edema associated with type 2 diabetes mellitus (HCC) [id number] Intravitreal Injection, Pharmacologic Agent - OD - Right Eye    triamcinolone acetonide (TRIESENCE) 40 MG/ML subtenons injection 4 mg    CANCELED: Intravitreal Injection, Pharmacologic Agent - OS - Left Eye  2. Hypertensive retinopathy of both eyes H35.033   3. 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 Retinal edema H35.81 OCT, Retina - OU - Both Eyes    1. Moderate nonproliferative diabetic retinopathy, both eyes - Diabetic macular edema, both eyes -- relatively mild - S/P IVA OD #1 (12.17.18), #2 (01.28.19), #3 (02.27.19), #4 (04.03.19), #5 (05.01.19) - S/P IVTA OD #1 (05.29.19) -- 8 wks since last injection - today, persistent IRF OD on OCT - FA at prior visit did not show significant Mas amenable to focal laser -- will hold off on focal laser for now -  BCVA stable at 20/40 OD, 20/30 OS - in reviewing her case, there has been minimal response to IVA in OCT or BCVA - recommend IVTA #2 OD today (07.24.19) - pt wishes to proceed - RBA of procedure discussed, questions answered - informed consent obtained and signed - see procedure note - f/u 6 weeks  2. Hypertensive retinopathy OU - discussed importance of tight BP control - monitor  3. Nuclear and Cortical Cataracts OU;  - The symptoms of cataract, surgical options, and treatments and risks were discussed with patient. - discussed diagnosis and progression - not yet visually significant - monitor for now  4. Retinal edema as above   Ophthalmic Meds Ordered this visit:  Meds ordered this encounter  Medications  . triamcinolone acetonide (TRIESENCE) 40 MG/ML subtenons injection 4 mg       Return in about 6 weeks (around 04/14/2018) for F/U NPDR OU, DFE, OCT.  There are no Patient Instructions on file for this visit.   Explained the diagnoses, plan, and follow up with the patient and they expressed understanding.  Patient expressed understanding of the importance of proper follow up care.   This document serves as a record of services personally performed by Karie ChimeraBrian G. Dontavis Tschantz, MD, PhD. It was created on their behalf by Laurian BrimAmanda Brown, OA, an ophthalmic assistant. The creation of this record is the provider's dictation and/or activities during the visit.    Electronically signed by: Laurian BrimAmanda Brown, OA  07.23.2019 5:12 PM     Karie ChimeraBrian G. Julieanna Geraci, M.D., Ph.D. Diseases & Surgery of the Retina and Vitreous [facility] 03/03/18  I have reviewed the above documentation for accuracy and completeness, and I agree with the above. Karie ChimeraBrian G. Deseri Loss, M.D., Ph.D. 03/03/18 5:12 PM    Abbreviations: M myopia (nearsighted); A astigmatism; H hyperopia (farsighted); P presbyopia; Mrx spectacle prescription;  CTL  contact lenses; OD right eye; OS left eye; OU both eyes  XT  exotropia; ET esotropia; PEK punctate epithelial keratitis; PEE punctate epithelial erosions; DES dry eye syndrome; MGD meibomian gland dysfunction; ATs artificial tears; PFAT's preservative free artificial tears; NSC nuclear sclerotic cataract; PSC posterior subcapsular cataract; ERM epi-retinal membrane; PVD posterior vitreous detachment; RD retinal detachment; DM diabetes mellitus; DR diabetic retinopathy; NPDR non-proliferative diabetic retinopathy; PDR proliferative diabetic retinopathy; CSME clinically significant macular edema; DME diabetic macular edema; dbh dot blot hemorrhages; CWS cotton wool spot; POAG primary open angle glaucoma; C/D cup-to-disc ratio; HVF humphrey visual field; GVF goldmann visual field; OCT optical coherence tomography; IOP intraocular pressure; BRVO Branch retinal vein occlusion; CRVO central retinal vein occlusion; CRAO central retinal artery occlusion; BRAO branch retinal artery occlusion; RT retinal tear; SB scleral buckle; PPV pars plana vitrectomy; VH Vitreous hemorrhage; PRP panretinal laser photocoagulation; IVK intravitreal kenalog; VMT vitreomacular traction; MH Macular hole;  NVD neovascularization of the disc; NVE neovascularization elsewhere; AREDS age related eye disease study; ARMD age related macular degeneration; POAG primary open angle glaucoma; EBMD epithelial/anterior basement membrane dystrophy; ACIOL anterior chamber intraocular lens; IOL intraocular lens; PCIOL posterior chamber intraocular lens; Phaco/IOL phacoemulsification with intraocular lens placement; PRK photorefractive keratectomy; LASIK laser assisted in situ keratomileusis; HTN hypertension; DM diabetes mellitus; COPD chronic obstructive pulmonary disease

## 2018-03-03 ENCOUNTER — Encounter (INDEPENDENT_AMBULATORY_CARE_PROVIDER_SITE_OTHER): Payer: Self-pay | Admitting: Ophthalmology

## 2018-03-03 ENCOUNTER — Ambulatory Visit (INDEPENDENT_AMBULATORY_CARE_PROVIDER_SITE_OTHER): Payer: BLUE CROSS/BLUE SHIELD | Admitting: Ophthalmology

## 2018-03-03 DIAGNOSIS — E113313 Type 2 diabetes mellitus with moderate nonproliferative diabetic retinopathy with macular edema, bilateral: Secondary | ICD-10-CM | POA: Diagnosis not present

## 2018-03-03 DIAGNOSIS — H3581 Retinal edema: Secondary | ICD-10-CM | POA: Diagnosis not present

## 2018-03-03 DIAGNOSIS — H35033 Hypertensive retinopathy, bilateral: Secondary | ICD-10-CM

## 2018-03-03 DIAGNOSIS — H25813 Combined forms of age-related cataract, bilateral: Secondary | ICD-10-CM

## 2018-03-03 MED ORDER — TRIAMCINOLONE ACETONIDE 40 MG/ML IO SUSP
4.0000 mg | INTRAOCULAR | Status: DC
  Administered 2018-03-03: 4 mg via INTRAVITREAL

## 2018-03-09 ENCOUNTER — Telehealth: Payer: Self-pay | Admitting: Family Medicine

## 2018-03-09 NOTE — Telephone Encounter (Signed)
Pt is calling and wants another blood pressure pill called in, she doesn't like the one she is on

## 2018-03-15 NOTE — Telephone Encounter (Signed)
Please find out what is the problem with the one that she is on? The other week when she was in with her husband, she stated that she was fine. Janine Limboachel H. Tracie HarrierHagler, MD

## 2018-03-17 ENCOUNTER — Telehealth: Payer: Self-pay | Admitting: Family Medicine

## 2018-03-17 MED ORDER — TELMISARTAN 20 MG PO TABS: 20.0000 mg | ORAL_TABLET | Freq: Every day | ORAL | 0 refills | Status: DC

## 2018-03-17 NOTE — Telephone Encounter (Signed)
Left message requesting  call back.

## 2018-03-17 NOTE — Telephone Encounter (Signed)
Spoke with patient. She complains of cramping in feet, a nagging cough like that one she had when she was on Lisinopril, and feeling groggy in the mornings with current BP medication. I advised her I would send a message to Dr.Hagler and call her back with a response. She verbalized understanding.

## 2018-03-17 NOTE — Telephone Encounter (Signed)
Please call and advise that she can discontinue the losartan or Benicar.  Advised that she can start telmisartan 20 mg po qd. I have sent this into her pharmacy.  Advised to follow-up in 2 to 4 weeks.

## 2018-03-19 NOTE — Telephone Encounter (Signed)
Spoke with patient and advised of Dr.Haglers recommendations with verbal understanding. She stated she has an appt next Friday. I told her to keep that appt. If this isn't ok, someone will call her back to change it.

## 2018-03-26 ENCOUNTER — Ambulatory Visit: Payer: BLUE CROSS/BLUE SHIELD | Admitting: Family Medicine

## 2018-04-13 NOTE — Progress Notes (Signed)
[facility] - Clinic Note  04/14/2018     CHIEF COMPLAINT Patient presents for Retina Follow Up   HISTORY OF PRESENT ILLNESS: Carolyn Hunter is a 67 y.o. female who presents to the clinic today for:   HPI    Retina Follow Up    Patient presents with  Diabetic Retinopathy.  In both eyes.  Severity is moderate.  Duration of 6 weeks.  Since onset it is stable.  I, the attending physician,  performed the HPI with the patient and updated documentation appropriately.          Comments    F/U NPDR OU; Pt states she feels she is seeing "a little better" since last visit; Pt states she tolerated last injection well; Pt states she is prepared to have another injection if needed today; Pt denies floaters, denies flashes, denies wavy VA; Pt states OU are comfortable;        Last edited by Rennis Chris, MD on 04/14/2018  2:29 PM. (History)       Referring physician: Aliene Beams, MD [address] [place], [place] [address]  HISTORICAL INFORMATION:   Selected notes from the MEDICAL RECORD NUMBER Referral from Dr. Fabienne Bruns for DM exam;  Ocular Hx-  PMH- DM; high chol; HTN; Last A1C- 7.1 (9.7.18);    CURRENT MEDICATIONS: No current outpatient medications on file. (Ophthalmic Drugs)   Current Facility-Administered Medications (Ophthalmic Drugs)  Medication Route  . triamcinolone acetonide (TRIESENCE) 40 MG/ML subtenons injection 4 mg Intravitreal  . triamcinolone acetonide (TRIESENCE) 40 MG/ML subtenons injection 4 mg Intravitreal  . triamcinolone acetonide (TRIESENCE) 40 MG/ML subtenons injection 4 mg Intravitreal   Current Outpatient Medications (Other)  Medication Sig  . aspirin EC 81 MG tablet Take 1 tablet (81 mg total) by mouth daily.  . metFORMIN (GLUCOPHAGE) 500 MG tablet Take 1 tablet (500 mg total) by mouth 2 (two) times daily with a meal.  . simvastatin (ZOCOR) 40 MG tablet Take 1 tablet (40 mg total) by mouth at bedtime.  Marland Kitchen telmisartan  (MICARDIS) 20 MG tablet Take 1 tablet (20 mg total) by mouth daily.   No current facility-administered medications for this visit.  (Other)      REVIEW OF SYSTEMS: ROS    Positive for: Endocrine, Cardiovascular, Eyes   Negative for: Constitutional, Gastrointestinal, Neurological, Skin, Genitourinary, Musculoskeletal, HENT, Respiratory, Psychiatric, Allergic/Imm, Heme/Lymph   Last edited by Concepcion Elk, COA on 04/14/2018  1:38 PM. (History)       ALLERGIES No Known Allergies  PAST MEDICAL HISTORY Past Medical History:  Diagnosis Date  . Cataract   . Diabetes mellitus without complication (HCC)   . Hyperlipidemia   . Hypertension    Past Surgical History:  Procedure Laterality Date  . CESAREAN SECTION     1988    FAMILY HISTORY Family History  Problem Relation Age of Onset  . Cancer Mother        breast and colon cancer  . Diabetes Brother   . Diabetes Son   . Asthma Father        COPD  . Diabetes Son     SOCIAL HISTORY Social History   Tobacco Use  . Smoking status: Never Smoker  . Smokeless tobacco: Never Used  Substance Use Topics  . Alcohol use: No  . Drug use: No         OPHTHALMIC EXAM:  Base Eye Exam    Visual Acuity (Snellen - Linear)  Right Left   Dist [place] 20/40 20/40   Dist ph [place] 20/30 -2       Tonometry (Tonopen, 1:45 PM)      Right Left   Pressure 19 17       Pupils      Dark Light Shape React APD   Right 3 2 Round Slow None   Left 3 2 Round Slow None       Visual Fields (Counting fingers)      Left Right    Full Full       Extraocular Movement      Right Left    Full, Ortho Full, Ortho       Neuro/Psych    Oriented x3:  Yes   Mood/Affect:  Normal       Dilation    Both eyes:  1.0% Mydriacyl, 2.5% Phenylephrine @ 1:45 PM        Slit Lamp and Fundus Exam    Slit Lamp Exam      Right Left   Lids/Lashes Dermatochalasis - upper lid, Meibomian gland dysfunction Dermatochalasis - upper lid, Meibomian  gland dysfunction   Conjunctiva/Sclera White and quiet White and quiet   Cornea Mild Arcus Mild Arcus, EBMD, 1+ Punctate epithelial erosions   Anterior Chamber Deep and quiet Deep and quiet   Iris Round and dilated, No NVI, PPM nasally Round and dilated, No NVI   Lens 3+ Nuclear sclerosis, 2+ Cortical cataract 2-3+ Nuclear sclerosis, 2+ Cortical cataract   Vitreous Normal Normal       Fundus Exam      Right Left   Disc Normal, No NVD Normal, No NVD   C/D Ratio 0.4 0.4   Macula blunted foveal reflex with mild interval improvement in cystic changes, perifoveal exudates, MAs, exudates superior and supratemporal to fovea blunted foveal reflex and trace cystic changes, exudates temporal to fovea, MAs, mild Retinal pigment epithelial mottling   Vessels Tortuous, AV crossing changes, Copper wiring Tortuous with early veinous beading    Periphery Attached, scattered MAs, DBH, exudates inferior to disc - improving attached, CWS nasally and inferiorly, DBH, exudates          IMAGING AND PROCEDURES  Imaging and Procedures for 12/10/17  OCT, Retina - OU - Both Eyes       Right Eye Quality was borderline. Central Foveal Thickness: 359. Progression has improved. Findings include abnormal foveal contour, intraretinal hyper-reflective material, no SRF, intraretinal fluid (Interval improvement in IRF).   Left Eye Quality was borderline. Central Foveal Thickness: 326. Progression has improved. Findings include abnormal foveal contour, intraretinal fluid, intraretinal hyper-reflective material, no SRF (Mild interval improvement in IRF).   Notes Images taken, stored on drive  Diagnosis / Impression:  DME OU (OD>OS) OD: interval improvement in IRF OS: mild interval improvement in IRF   Clinical management:  See below  Abbreviations: NFP - Normal foveal profile. CME - cystoid macular edema. PED - pigment epithelial detachment. IRF - intraretinal fluid. SRF - subretinal fluid. EZ - ellipsoid  zone. ERM - epiretinal membrane. ORA - outer retinal atrophy. ORT - outer retinal tubulation. SRHM - subretinal hyper-reflective material         Intravitreal Injection, Pharmacologic Agent - OD - Right Eye       Time Out 04/14/2018. 2:45 PM. Confirmed correct patient, procedure, site, and patient consented.   Anesthesia Topical anesthesia was used. Anesthetic medications included Lidocaine 2%, Tetracaine 0.5%.   Procedure Preparation included 5% betadine to ocular  surface, eyelid speculum. A 27 gauge needle was used.   Injection:  4 mg triamcinolone acetonide 40 MG/ML   NDC: 9811-9147-82, Lot: [id number], Expiration date: 10/07/2020   Route: Intravitreal, Site: Right Eye, Waste: 0.9 mL  Post-op Post injection exam found visual acuity of at least counting fingers. The patient tolerated the procedure well. There were no complications. The patient received written and verbal post procedure care education.                 ASSESSMENT/PLAN:    ICD-10-CM   1. Moderate nonproliferative diabetic retinopathy of both eyes with macular edema associated with type 2 diabetes mellitus (HCC) E11.3313 OCT, Retina - OU - Both Eyes    Intravitreal Injection, Pharmacologic Agent - OD - Right Eye    triamcinolone acetonide (TRIESENCE) 40 MG/ML subtenons injection 4 mg  2. Hypertensive retinopathy of both eyes H35.033   3. 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 Retinal edema H35.81 OCT, Retina - OU - Both Eyes    1. Moderate nonproliferative diabetic retinopathy, both eyes - Diabetic macular edema, both eyes -- relatively mild - S/P IVA OD #1 (12.17.18), #2 (01.28.19), #3 (02.27.19), #4 (04.03.19), #5 (05.01.19) - S/P IVTA OD #1 (05.29.19), #2 (07.24.19) -- 6 wks since last injection - today, mild interval improvement in IRF on OCT - FA at prior visit did not show significant Mas amenable to focal laser -- will hold off on focal laser for now - BCVA stable at 20/40  OD, 20/30 OS - in reviewing her case, there was minimal response to IVA in OCT or BCVA - slightly better response with IVTA - recommend IVTA #3 OD today (09.04.19) - pt wishes to proceed - RBA of procedure discussed, questions answered - informed consent obtained and signed - see procedure note - f/u 6 weeks  2. Hypertensive retinopathy OU - discussed importance of tight BP control - monitor  3. Nuclear and Cortical Cataracts OU;  - The symptoms of cataract, surgical options, and treatments and risks were discussed with patient. - discussed diagnosis and progression - approaching visual significance - monitor for now, but will refer for cat eval soon  4. Retinal edema as above   Ophthalmic Meds Ordered this visit:  Meds ordered this encounter  Medications  . triamcinolone acetonide (TRIESENCE) 40 MG/ML subtenons injection 4 mg       Return in about 6 weeks (around 05/26/2018) for F/U NPDR OU, DFE, OCT.  There are no Patient Instructions on file for this visit.   Explained the diagnoses, plan, and follow up with the patient and they expressed understanding.  Patient expressed understanding of the importance of proper follow up care.   This document serves as a record of services personally performed by Karie Chimera, MD, PhD. It was created on their behalf by Laurian Brim, OA, an ophthalmic assistant. The creation of this record is the provider's dictation and/or activities during the visit.    Electronically signed by: Laurian Brim, OA  09.03.2019 4:48 PM     Karie Chimera, M.D., Ph.D. Diseases & Surgery of the Retina and Vitreous [facility]   I have reviewed the above documentation for accuracy and completeness, and I agree with the above. Karie Chimera, M.D., Ph.D. 04/14/18 4:49 PM     Abbreviations: M myopia (nearsighted); A astigmatism; H hyperopia (farsighted); P presbyopia; Mrx spectacle prescription;  CTL contact lenses; OD  right eye; OS left  eye; OU both eyes  XT exotropia; ET esotropia; PEK punctate epithelial keratitis; PEE punctate epithelial erosions; DES dry eye syndrome; MGD meibomian gland dysfunction; ATs artificial tears; PFAT's preservative free artificial tears; [place] nuclear sclerotic cataract; PSC posterior subcapsular cataract; ERM epi-retinal membrane; PVD posterior vitreous detachment; RD retinal detachment; DM diabetes mellitus; DR diabetic retinopathy; NPDR non-proliferative diabetic retinopathy; PDR proliferative diabetic retinopathy; CSME clinically significant macular edema; DME diabetic macular edema; dbh dot blot hemorrhages; CWS cotton wool spot; POAG primary open angle glaucoma; C/D cup-to-disc ratio; HVF humphrey visual field; GVF goldmann visual field; OCT optical coherence tomography; IOP intraocular pressure; BRVO Branch retinal vein occlusion; CRVO central retinal vein occlusion; CRAO central retinal artery occlusion; BRAO branch retinal artery occlusion; RT retinal tear; SB scleral buckle; PPV pars plana vitrectomy; VH Vitreous hemorrhage; PRP panretinal laser photocoagulation; IVK intravitreal kenalog; VMT vitreomacular traction; MH Macular hole;  NVD neovascularization of the disc; NVE neovascularization elsewhere; AREDS age related eye disease study; ARMD age related macular degeneration; POAG primary open angle glaucoma; EBMD epithelial/anterior basement membrane dystrophy; ACIOL anterior chamber intraocular lens; IOL intraocular lens; PCIOL posterior chamber intraocular lens; Phaco/IOL phacoemulsification with intraocular lens placement; [place] photorefractive keratectomy; LASIK laser assisted in situ keratomileusis; HTN hypertension; DM diabetes mellitus; COPD chronic obstructive pulmonary disease

## 2018-04-14 ENCOUNTER — Ambulatory Visit (INDEPENDENT_AMBULATORY_CARE_PROVIDER_SITE_OTHER): Payer: PPO | Admitting: Ophthalmology

## 2018-04-14 ENCOUNTER — Encounter (INDEPENDENT_AMBULATORY_CARE_PROVIDER_SITE_OTHER): Payer: Self-pay | Admitting: Ophthalmology

## 2018-04-14 DIAGNOSIS — Z79899 Other long term (current) drug therapy: Secondary | ICD-10-CM | POA: Diagnosis not present

## 2018-04-14 DIAGNOSIS — E119 Type 2 diabetes mellitus without complications: Secondary | ICD-10-CM | POA: Diagnosis not present

## 2018-04-14 DIAGNOSIS — H3581 Retinal edema: Secondary | ICD-10-CM

## 2018-04-14 DIAGNOSIS — E113313 Type 2 diabetes mellitus with moderate nonproliferative diabetic retinopathy with macular edema, bilateral: Secondary | ICD-10-CM

## 2018-04-14 DIAGNOSIS — E782 Mixed hyperlipidemia: Secondary | ICD-10-CM | POA: Diagnosis not present

## 2018-04-14 DIAGNOSIS — H35033 Hypertensive retinopathy, bilateral: Secondary | ICD-10-CM

## 2018-04-14 DIAGNOSIS — H25813 Combined forms of age-related cataract, bilateral: Secondary | ICD-10-CM

## 2018-04-14 MED ORDER — TRIAMCINOLONE ACETONIDE 40 MG/ML IO SUSP
4.0000 mg | INTRAOCULAR | Status: DC
  Administered 2018-04-14: 4 mg via INTRAVITREAL

## 2018-04-15 ENCOUNTER — Encounter: Payer: Self-pay | Admitting: Family Medicine

## 2018-04-15 LAB — BASIC METABOLIC PANEL
BUN: 13 mg/dL (ref 7–25)
CALCIUM: 10.3 mg/dL (ref 8.6–10.4)
CHLORIDE: 101 mmol/L (ref 98–110)
CO2: 34 mmol/L — ABNORMAL HIGH (ref 20–32)
Creat: 0.82 mg/dL (ref 0.50–0.99)
Glucose, Bld: 109 mg/dL — ABNORMAL HIGH (ref 65–99)
POTASSIUM: 4.1 mmol/L (ref 3.5–5.3)
Sodium: 140 mmol/L (ref 135–146)

## 2018-04-15 LAB — LIPID PANEL
Cholesterol: 195 mg/dL (ref <200)
HDL: 53 mg/dL (ref >50)
LDL Cholesterol (Calc): 116 mg/dL (calc) — ABNORMAL HIGH
Non-HDL Cholesterol (Calc): 142 mg/dL (calc) — ABNORMAL HIGH (ref <130)
Total CHOL/HDL Ratio: 3.7 (calc) (ref <5.0)
Triglycerides: 143 mg/dL (ref <150)

## 2018-04-15 LAB — HEMOGLOBIN A1C
Hgb A1c MFr Bld: 5.6 % of total Hgb (ref <5.7)
MEAN PLASMA GLUCOSE: 114 (calc)
eAG (mmol/L): 6.3 (calc)

## 2018-04-20 ENCOUNTER — Encounter: Payer: Self-pay | Admitting: Family Medicine

## 2018-04-20 ENCOUNTER — Other Ambulatory Visit: Payer: Self-pay

## 2018-04-20 ENCOUNTER — Ambulatory Visit (INDEPENDENT_AMBULATORY_CARE_PROVIDER_SITE_OTHER): Payer: PPO | Admitting: Family Medicine

## 2018-04-20 VITALS — BP 166/78 | HR 91 | Temp 97.9°F | Resp 12 | Ht 63.0 in | Wt 146.0 lb

## 2018-04-20 DIAGNOSIS — E119 Type 2 diabetes mellitus without complications: Secondary | ICD-10-CM | POA: Insufficient documentation

## 2018-04-20 DIAGNOSIS — E782 Mixed hyperlipidemia: Secondary | ICD-10-CM

## 2018-04-20 DIAGNOSIS — E1169 Type 2 diabetes mellitus with other specified complication: Secondary | ICD-10-CM | POA: Diagnosis not present

## 2018-04-20 DIAGNOSIS — Z23 Encounter for immunization: Secondary | ICD-10-CM | POA: Diagnosis not present

## 2018-04-20 DIAGNOSIS — I10 Essential (primary) hypertension: Secondary | ICD-10-CM | POA: Diagnosis not present

## 2018-04-20 MED ORDER — LISINOPRIL 10 MG PO TABS
10.0000 mg | ORAL_TABLET | Freq: Every day | ORAL | 0 refills | Status: DC
Start: 2018-04-20 — End: 2018-04-28

## 2018-04-20 MED ORDER — METFORMIN HCL 500 MG PO TABS: 500.0000 mg | ORAL_TABLET | Freq: Every day | ORAL | 0 refills | Status: DC

## 2018-04-20 NOTE — Progress Notes (Signed)
Patient ID: Carolyn Hunter, female    DOB: 04/30/1951, 68 y.o.   MRN: [id number]  Chief Complaint  Patient presents with  . Hypertension    follow up    Allergies Patient has no known allergies.  Subjective:   Carolyn Hunter is a 67 y.o. female who presents to [facility] today.  HPI Carolyn Hunter presents today for follow-up.  She reports that she has not been taking any blood pressure medication.  She reports that she has had side effects with the losartan, telmisartan, and olmesartan.  She would like to go back on the lisinopril 10 mg a day.  She reports that she actually does not believe that she had a cough with this medication.  She believes it was related to allergies or an upper respiratory infection.  She would like to start back on this medication because it lowered her blood pressure and it did not cause her any problems.  She is also here to review her cholesterol results today.  Her LDL is improved but is still not less than 70.  She does not wish to increase her medication.  She is pleased with the decrease in her LDL from 250 approximately 1 year ago.  She does not have any side effects or myalgias with the statin medication.  She defers to increase her medication.  She reports that she has been feeling good and working on her diet.  She reports she eats very healthy and exercises.  Her last A1c was 5.6%.  She has been on metformin 500 mg twice a day.  We have continue to decrease her metformin over the past year as her diet and exercise have improved.  She denies any hypoglycemic episodes.  No lesions on her feet.  She reports that she feels good.  She denies any chest pain, shortness of breath, or swelling in her extremities.  She would like to get a flu shot today.  Her pneumonia shots are up-to-date.  She is not Hunter for an eye exam until October.   Past Medical History:  Diagnosis Date  . Cataract   . Diabetes mellitus without complication (HCC)   .  Hyperlipidemia   . Hypertension     Past Surgical History:  Procedure Laterality Date  . CESAREAN SECTION     1988    Family History  Problem Relation Age of Onset  . Cancer Mother        breast and colon cancer  . Diabetes Brother   . Diabetes Son   . Asthma Father        COPD  . Diabetes Son      Social History   Socioeconomic History  . Marital status: Married    Spouse name: Not on file  . Number of children: Not on file  . Years of education: Not on file  . Highest education level: Not on file  Occupational History  . Occupation: retired  Engineer, production  . Financial resource strain: Not on file  . Food insecurity:    Worry: Not on file    Inability: Not on file  . Transportation needs:    Medical: Not on file    Non-medical: Not on file  Tobacco Use  . Smoking status: Never Smoker  . Smokeless tobacco: Never Used  Substance and Sexual Activity  . Alcohol use: No  . Drug use: No  . Sexual activity: Yes    Birth control/protection: Post-menopausal    Comment:  67 years old  Lifestyle  . Physical activity:    Days per week: Not on file    Minutes per session: Not on file  . Stress: Not on file  Relationships  . Social connections:    Talks on phone: Not on file    Gets together: Not on file    Attends religious service: Not on file    Active member of club or organization: Not on file    Attends meetings of clubs or organizations: Not on file    Relationship status: Not on file  Other Topics Concern  . 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 Eats all food groups. Exercises 30 min a day, four days a week.    Enjoys activities. Does not attend church. Watch television.    Current Outpatient Medications on File Prior to Visit  Medication Sig Dispense Refill  . aspirin EC 81 MG tablet Take 1 tablet (81 mg total) by mouth daily.    . simvastatin (ZOCOR) 40 MG tablet Take 1 tablet (40 mg total) by  mouth at bedtime. 90 tablet 0   Current Facility-Administered Medications on File Prior to Visit  Medication Dose Route Frequency Provider Last Rate Last Dose  . triamcinolone acetonide (TRIESENCE) 40 MG/ML subtenons injection 4 mg  4 mg Intravitreal  Rennis Chris, MD   4 mg at 01/07/18 1248  . triamcinolone acetonide (TRIESENCE) 40 MG/ML subtenons injection 4 mg  4 mg Intravitreal  Rennis Chris, MD   4 mg at 03/03/18 1708  . triamcinolone acetonide (TRIESENCE) 40 MG/ML subtenons injection 4 mg  4 mg Intravitreal  Rennis Chris, MD   4 mg at 04/14/18 1647    Review of Systems  Constitutional: Negative for activity change, appetite change and fever.  Eyes: Negative for visual disturbance.  Respiratory: Negative for cough, chest tightness and shortness of breath.   Cardiovascular: Negative for chest pain, palpitations and leg swelling.  Gastrointestinal: Negative for abdominal pain, nausea and vomiting.  Genitourinary: Negative for dysuria, frequency and urgency.  Neurological: Negative for dizziness, syncope and light-headedness.  Hematological: Negative for adenopathy.  Psychiatric/Behavioral: Negative for behavioral problems. The patient is not hyperactive.      Objective:   BP (!) 166/78 (BP Location: Left Arm, Patient Position: Sitting, Cuff Size: Large)   Pulse 91   Temp 97.9 F (36.6 C) (Temporal)   Resp 12   Ht 5\' 3"  (1.6 m)   Wt 146 lb 0.6 oz (66.2 kg)   SpO2 100% Comment: room air  BMI 25.87 kg/m   Physical Exam  Constitutional: She is oriented to person, place, and time. She appears well-developed and well-nourished. No distress.  HENT:  Head: Normocephalic and atraumatic.  Eyes: Pupils are equal, round, and reactive to light. Conjunctivae are normal.  Neck: Normal range of motion. Neck supple. No thyromegaly present.  Cardiovascular: Normal rate, regular rhythm and normal heart sounds.  Pulmonary/Chest: Effort normal and breath sounds normal. No respiratory  distress.  Neurological: She is alert and oriented to person, place, and time. No cranial nerve deficit.  Skin: Skin is warm and dry.  Psychiatric: She has a normal mood and affect. Her behavior is normal. Judgment and thought content normal.  Nursing note and vitals reviewed.  Depression screen [facility] 2/9 04/20/2018 02/17/2018 01/05/2018 04/17/2017  Decreased Interest 0 0 0 0  Down, Depressed, Hopeless 0 0 0 0  PHQ - 2 Score 0 0 0  0    Assessment and Plan   1. HTN, goal below 130/80 Long discussion with patient today regarding lisinopril.  She does wish to start this medication back.  She understands that 1 of the side effects with this medication is a cough.  She does not have a current cough and wishes to try this medication Hunter to the fact that she would like renal protective effects of the ACE inhibitor and would like to have better blood pressure control.  She understands that if she has a cough with this medication that she will need to discontinue this medication and follow-up with her new PCP.  She also understands after discussion that she will need to have her creatinine rechecked in approximately 4 weeks after initiating the lisinopril.  She also understands that her blood pressure is not controlled and this needs to be followed up on with in the next 2 to 4 weeks.  Lifestyle modifications discussed with patient including a diet emphasizing vegetables, fruits, and whole grains. Limiting intake of sodium to less than 2,400 mg per day.  Recommendations discussed include consuming low-fat dairy products, poultry, fish, legumes, non-tropical vegetable oils, and nuts; and limiting intake of sweets, sugar-sweetened beverages, and red meat. Discussed following a plan such as the Dietary Approaches to Stop Hypertension (DASH) diet. Patient to read up on this diet.   - lisinopril (PRINIVIL,ZESTRIL) 10 MG tablet; Take 1 tablet (10 mg total) by mouth daily.  Dispense: 90 tablet; Refill: 0  2. Need for  immunization against influenza Vaccination given. - Flu Vaccine QUAD 36+ mos IM  3. Mixed hyperlipidemia LDL is drastically improved from 1 year ago.  She defers to further increase her statin medication.  She understands she will need routine monitoring of her liver function while taking this medication.  Continue with diet, exercise, and healthy lifestyle modifications.  4. White coat syndrome with diagnosis of hypertension  5.  Diabetes mellitus type 2 Decrease metformin to 500 mg p.o. daily.  Plan to recheck A1c in 3 months.  Keep scheduled follow-up for routine diabetic health maintenance.  Foot care discussed. No follow-ups on file. Aliene Beams, MD 04/20/2018

## 2018-04-20 NOTE — Patient Instructions (Addendum)
Fage greek yogurt with fruit or granola chobani  You need to have your kidney function recheck within the next month after starting the lisinopril.

## 2018-04-21 ENCOUNTER — Other Ambulatory Visit: Payer: Self-pay | Admitting: Family Medicine

## 2018-04-28 ENCOUNTER — Telehealth: Payer: Self-pay | Admitting: Family Medicine

## 2018-04-28 DIAGNOSIS — I10 Essential (primary) hypertension: Secondary | ICD-10-CM

## 2018-04-28 DIAGNOSIS — E1169 Type 2 diabetes mellitus with other specified complication: Secondary | ICD-10-CM

## 2018-04-28 DIAGNOSIS — E782 Mixed hyperlipidemia: Secondary | ICD-10-CM

## 2018-04-28 MED ORDER — METFORMIN HCL 500 MG PO TABS: 500.0000 mg | ORAL_TABLET | Freq: Every day | ORAL | 1 refills | Status: DC

## 2018-04-28 MED ORDER — SIMVASTATIN 40 MG PO TABS: 40.0000 mg | ORAL_TABLET | Freq: Every day | ORAL | 1 refills | Status: DC

## 2018-04-28 MED ORDER — LISINOPRIL 10 MG PO TABS: 10.0000 mg | ORAL_TABLET | Freq: Every day | ORAL | 1 refills | Status: DC

## 2018-04-28 NOTE — Telephone Encounter (Signed)
Patient came in today with her husband for his office visit.  While she was here today she requested refills of her medicines.  She is going to establish care with new PCP office and have a visit within the next 3 months.  However she is having some anxiety about running out of her medications.  Medication refill sent in for her today.

## 2018-05-25 NOTE — Progress Notes (Signed)
[facility] - Clinic Note  05/26/2018     CHIEF COMPLAINT Patient presents for Retina Follow Up   HISTORY OF PRESENT ILLNESS: Carolyn Hunter is a 67 y.o. female who presents to the clinic today for:   HPI    Retina Follow Up    Patient presents with  Diabetic Retinopathy.  In both eyes.  Severity is moderate.  Duration of 6 weeks.  Since onset it is stable.  I, the attending physician,  performed the HPI with the patient and updated documentation appropriately.          Comments    Pt presents for NPDR OU f/u, pt states VA has been about the same as last visit, pt has a new floaters in her right, pt denies flashes of light, pt denies the use of gtts, pts last A1C was 5.6 and she is now only taking one pill a day,        Last edited by Rennis Chris, MD on 05/26/2018  3:17 PM. (History)       Referring physician: Aliene Beams, MD [phone] [address] [place], [place] [address]  HISTORICAL INFORMATION:   Selected notes from the MEDICAL RECORD NUMBER Referral from Dr. Fabienne Bruns for DM exam;  Ocular Hx-  PMH- DM; high chol; HTN; Last A1C- 7.1 (9.7.18);    CURRENT MEDICATIONS: No current outpatient medications on file. (Ophthalmic Drugs)   Current Facility-Administered Medications (Ophthalmic Drugs)  Medication Route  . triamcinolone acetonide (TRIESENCE) 40 MG/ML subtenons injection 4 mg Intravitreal  . triamcinolone acetonide (TRIESENCE) 40 MG/ML subtenons injection 4 mg Intravitreal  . triamcinolone acetonide (TRIESENCE) 40 MG/ML subtenons injection 4 mg Intravitreal   Current Outpatient Medications (Other)  Medication Sig  . aspirin EC 81 MG tablet Take 1 tablet (81 mg total) by mouth daily.  Marland Kitchen lisinopril (PRINIVIL,ZESTRIL) 10 MG tablet Take 1 tablet (10 mg total) by mouth daily.  . metFORMIN (GLUCOPHAGE) 500 MG tablet Take 1 tablet (500 mg total) by mouth daily with breakfast.  . simvastatin (ZOCOR) 40 MG tablet Take 1 tablet (40 mg total) by mouth  at bedtime.   No current facility-administered medications for this visit.  (Other)      REVIEW OF SYSTEMS: ROS    Positive for: Endocrine, Cardiovascular, Eyes   Negative for: Constitutional, Gastrointestinal, Neurological, Skin, Genitourinary, Musculoskeletal, HENT, Respiratory, Psychiatric, Heme/Lymph   Last edited by Posey Boyer, COT on 05/26/2018  2:11 PM. (History)       ALLERGIES No Known Allergies  PAST MEDICAL HISTORY Past Medical History:  Diagnosis Date  . Cataract   . Diabetes mellitus without complication (HCC)   . Hyperlipidemia   . Hypertension    Past Surgical History:  Procedure Laterality Date  . CESAREAN SECTION     1988    FAMILY HISTORY Family History  Problem Relation Age of Onset  . Cancer Mother        breast and colon cancer  . Diabetes Brother   . Diabetes Son   . Asthma Father        COPD  . Diabetes Son     SOCIAL HISTORY Social History   Tobacco Use  . Smoking status: Never Smoker  . Smokeless tobacco: Never Used  Substance Use Topics  . Alcohol use: No  . Drug use: No         OPHTHALMIC EXAM:  Base Eye Exam    Visual Acuity (Snellen - Linear)  Right Left   Dist [place] 20/30 -2 20/40 -1   Dist ph [place] NI 20/30 -2       Tonometry (Tonopen, 2:16 PM)      Right Left   Pressure 24 23       Tonometry #2 (Applanation, 3:24 PM)      Right Left   Pressure 22 16       Pupils      Dark Light Shape React APD   Right 3 2 Round Slow None   Left 3 2 Round Slow None       Visual Fields (Counting fingers)      Left Right    Full Full       Extraocular Movement      Right Left    Full, Ortho Full, Ortho       Neuro/Psych    Oriented x3:  Yes   Mood/Affect:  Normal       Dilation    Both eyes:  1.0% Mydriacyl, 2.5% Phenylephrine @ 2:16 PM        Slit Lamp and Fundus Exam    Slit Lamp Exam      Right Left   Lids/Lashes Dermatochalasis - upper lid, Meibomian gland dysfunction Dermatochalasis - upper  lid, Meibomian gland dysfunction   Conjunctiva/Sclera White and quiet White and quiet   Cornea Mild Arcus Mild Arcus, EBMD, 1+ Punctate epithelial erosions   Anterior Chamber Deep and quiet Deep and quiet   Iris Round and dilated, No NVI, PPM nasally Round and dilated, No NVI   Lens 3+ Nuclear sclerosis, 2+ Cortical cataract 2-3+ Nuclear sclerosis, 2+ Cortical cataract   Vitreous Normal Normal       Fundus Exam      Right Left   Disc Pink and Sharp Normal, No NVD   C/D Ratio 0.45 0.4   Macula blunted foveal reflex with mild interval improvement in cystic changes, perifoveal exudates, MAs, exudates superior and temporal to fovea blunted foveal reflex and trace cystic changes, exudates temporal to fovea, MAs, mild Retinal pigment epithelial mottling   Vessels Tortuous, AV crossing changes, Copper wiring Tortuous with early veinous beading    Periphery Attached, scattered MAs, DBH, exudates inferior to disc - improving attached, CWS nasally and inferiorly, DBH, exudates          IMAGING AND PROCEDURES  Imaging and Procedures for 12/10/17  OCT, Retina - OU - Both Eyes       Right Eye Quality was borderline. Central Foveal Thickness: 353. Progression has improved. Findings include abnormal foveal contour, intraretinal hyper-reflective material, no SRF, intraretinal fluid (Interval improvement in IRF).   Left Eye Quality was borderline. Central Foveal Thickness: 326. Progression has been stable. Findings include abnormal foveal contour, intraretinal fluid, intraretinal hyper-reflective material, no SRF (Mild persistant IRF).   Notes Images taken, stored on drive  Diagnosis / Impression:  DME OU (OD>OS) OD: interval improvement in IRF OS: mild stable IRF   Clinical management:  See below  Abbreviations: NFP - Normal foveal profile. CME - cystoid macular edema. PED - pigment epithelial detachment. IRF - intraretinal fluid. SRF - subretinal fluid. EZ - ellipsoid zone. ERM -  epiretinal membrane. ORA - outer retinal atrophy. ORT - outer retinal tubulation. SRHM - subretinal hyper-reflective material                  ASSESSMENT/PLAN:    ICD-10-CM   1. Moderate nonproliferative diabetic retinopathy of both eyes with macular edema associated with  type 2 diabetes mellitus (HCC) Z61.0960   2. Hypertensive retinopathy of both eyes H35.033   3. 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 Retinal edema H35.81 OCT, Retina - OU - Both Eyes    1. Moderate nonproliferative diabetic retinopathy, both eyes - Diabetic macular edema, both eyes -- relatively mild - S/P IVA OD #1 (12.17.18), #2 (01.28.19), #3 (02.27.19), #4 (04.03.19), #5 (05.01.19) - S/P IVTA OD #1 (05.29.19), #2 (07.24.19), #3 (09.04.19) - today, mild interval improvement in IRF on OCT - FA at prior visit did not show significant Mas amenable to focal laser -- will hold off on focal laser for now - BCVA improved to 20/30 OD, OS stable at 20/30 - in reviewing her case, there was minimal response to IVA in OCT or BCVA - slightly better response with IVTA - slight elevation in IOP -- 22 mmHg ?new steroid response - will hold off on IVTA today -- pt in agreement with plan - f/u 6 weeks  2. Hypertensive retinopathy OU - discussed importance of tight BP control - monitor  3. Nuclear and Cortical Cataracts OU;  - The symptoms of cataract, surgical options, and treatments and risks were discussed with patient. - discussed diagnosis and progression - approaching visual significance - monitor for now, but will refer for cat eval soon  4. Retinal edema as above   Ophthalmic Meds Ordered this visit:  No orders of the defined types were placed in this encounter.      Return in about 6 weeks (around 07/07/2018) for DFE, OCT.  There are no Patient Instructions on file for this visit.   Explained the diagnoses, plan, and follow up with the patient and they expressed  understanding.  Patient expressed understanding of the importance of proper follow up care.   This document serves as a record of services personally performed by Karie Chimera, MD, PhD. It was created on their behalf by Virgilio Belling, COA, a certified ophthalmic assistant. The creation of this record is the provider's dictation and/or activities during the visit.  Electronically signed by: Virgilio Belling, COA  10.15.19 9:18 AM   Karie Chimera, M.D., Ph.D. Diseases & Surgery of the Retina and Vitreous [facility]  I have reviewed the above documentation for accuracy and completeness, and I agree with the above. Karie Chimera, M.D., Ph.D. 05/28/18 9:18 AM    Abbreviations: M myopia (nearsighted); A astigmatism; H hyperopia (farsighted); P presbyopia; Mrx spectacle prescription;  CTL contact lenses; OD right eye; OS left eye; OU both eyes  XT exotropia; ET esotropia; PEK punctate epithelial keratitis; PEE punctate epithelial erosions; DES dry eye syndrome; MGD meibomian gland dysfunction; ATs artificial tears; PFAT's preservative free artificial tears; NSC nuclear sclerotic cataract; PSC posterior subcapsular cataract; ERM epi-retinal membrane; PVD posterior vitreous detachment; RD retinal detachment; DM diabetes mellitus; DR diabetic retinopathy; NPDR non-proliferative diabetic retinopathy; PDR proliferative diabetic retinopathy; CSME clinically significant macular edema; DME diabetic macular edema; dbh dot blot hemorrhages; CWS cotton wool spot; POAG primary open angle glaucoma; C/D cup-to-disc ratio; HVF humphrey visual field; GVF goldmann visual field; OCT optical coherence tomography; IOP intraocular pressure; BRVO Branch retinal vein occlusion; CRVO central retinal vein occlusion; CRAO central retinal artery occlusion; BRAO branch retinal artery occlusion; RT retinal tear; SB scleral buckle; PPV pars plana vitrectomy; VH Vitreous hemorrhage; PRP panretinal laser  photocoagulation; IVK intravitreal kenalog; VMT vitreomacular traction; MH Macular hole;  NVD neovascularization of the disc; NVE neovascularization  elsewhere; AREDS age related eye disease study; ARMD age related macular degeneration; POAG primary open angle glaucoma; EBMD epithelial/anterior basement membrane dystrophy; ACIOL anterior chamber intraocular lens; IOL intraocular lens; PCIOL posterior chamber intraocular lens; Phaco/IOL phacoemulsification with intraocular lens placement; PRK photorefractive keratectomy; LASIK laser assisted in situ keratomileusis; HTN hypertension; DM diabetes mellitus; COPD chronic obstructive pulmonary disease 

## 2018-05-26 ENCOUNTER — Ambulatory Visit (INDEPENDENT_AMBULATORY_CARE_PROVIDER_SITE_OTHER): Payer: PPO | Admitting: Ophthalmology

## 2018-05-26 ENCOUNTER — Encounter (INDEPENDENT_AMBULATORY_CARE_PROVIDER_SITE_OTHER): Payer: Self-pay | Admitting: Ophthalmology

## 2018-05-26 DIAGNOSIS — H35033 Hypertensive retinopathy, bilateral: Secondary | ICD-10-CM

## 2018-05-26 DIAGNOSIS — H25813 Combined forms of age-related cataract, bilateral: Secondary | ICD-10-CM

## 2018-05-26 DIAGNOSIS — H3581 Retinal edema: Secondary | ICD-10-CM

## 2018-05-26 DIAGNOSIS — E113313 Type 2 diabetes mellitus with moderate nonproliferative diabetic retinopathy with macular edema, bilateral: Secondary | ICD-10-CM

## 2018-05-28 ENCOUNTER — Encounter (INDEPENDENT_AMBULATORY_CARE_PROVIDER_SITE_OTHER): Payer: Self-pay | Admitting: Ophthalmology

## 2018-07-07 ENCOUNTER — Encounter (INDEPENDENT_AMBULATORY_CARE_PROVIDER_SITE_OTHER): Payer: PPO | Admitting: Ophthalmology

## 2018-07-13 NOTE — Progress Notes (Signed)
[facility] - Clinic Note  07/14/2018     CHIEF COMPLAINT Patient presents for Retina Follow Up   HISTORY OF PRESENT ILLNESS: Carolyn Hunter is a 67 y.o. female who presents to the clinic today for:   HPI    Retina Follow Up    Patient presents with  Diabetic Retinopathy.  In both eyes.  This started 6 weeks ago.  Severity is moderate.  Duration of 6 weeks.  Since onset it is stable.  I, the attending physician,  performed the HPI with the patient and updated documentation appropriately.          Comments    Patient here for 6 week retinal follow up. Vision is about the same. No eye pain.       Last edited by Rennis Chris, MD on 07/14/2018  2:37 PM. (History)    pt states she feels like her vision is better   Referring physician: Daisy Lazar, DO [address] [place], [place] [address]  HISTORICAL INFORMATION:   Selected notes from the MEDICAL RECORD NUMBER Referral from Dr. Fabienne Bruns for DM exam;  Ocular Hx-  PMH- DM; high chol; HTN; Last A1C- 7.1 (9.7.18);    CURRENT MEDICATIONS: No current outpatient medications on file. (Ophthalmic Drugs)   Current Facility-Administered Medications (Ophthalmic Drugs)  Medication Route  . triamcinolone acetonide (TRIESENCE) 40 MG/ML subtenons injection 4 mg Intravitreal  . triamcinolone acetonide (TRIESENCE) 40 MG/ML subtenons injection 4 mg Intravitreal  . triamcinolone acetonide (TRIESENCE) 40 MG/ML subtenons injection 4 mg Intravitreal   Current Outpatient Medications (Other)  Medication Sig  . aspirin EC 81 MG tablet Take 1 tablet (81 mg total) by mouth daily.  Marland Kitchen lisinopril (PRINIVIL,ZESTRIL) 10 MG tablet Take 1 tablet (10 mg total) by mouth daily.  . metFORMIN (GLUCOPHAGE) 500 MG tablet Take 1 tablet (500 mg total) by mouth daily with breakfast.  . simvastatin (ZOCOR) 40 MG tablet Take 1 tablet (40 mg total) by mouth at bedtime.   No current facility-administered medications for this visit.  (Other)       REVIEW OF SYSTEMS: ROS    Positive for: Musculoskeletal, Endocrine, Cardiovascular, Eyes   Last edited by Laddie Aquas on 07/14/2018  2:08 PM. (History)       ALLERGIES No Known Allergies  PAST MEDICAL HISTORY Past Medical History:  Diagnosis Date  . Cataract   . Diabetes mellitus without complication (HCC)   . Hyperlipidemia   . Hypertension    Past Surgical History:  Procedure Laterality Date  . CESAREAN SECTION     1988    FAMILY HISTORY Family History  Problem Relation Age of Onset  . Cancer Mother        breast and colon cancer  . Diabetes Brother   . Diabetes Son   . Asthma Father        COPD  . Diabetes Son     SOCIAL HISTORY Social History   Tobacco Use  . Smoking status: Never Smoker  . Smokeless tobacco: Never Used  Substance Use Topics  . Alcohol use: No  . Drug use: No         OPHTHALMIC EXAM:  Base Eye Exam    Visual Acuity (Snellen - Linear)      Right Left   Dist [place] 20/40 20/25 +1   Dist ph [place] NI        Tonometry (Tonopen, 2:10 PM)      Right Left   Pressure  17 14       Pupils      Dark Light Shape React APD   Right 3 2 Round Slow None   Left 3 2 Round Slow None       Visual Fields (Counting fingers)      Left Right    Full Full       Extraocular Movement      Right Left    Full, Ortho Full, Ortho       Neuro/Psych    Oriented x3:  Yes   Mood/Affect:  Normal       Dilation    Both eyes:  1.0% Mydriacyl, 2.5% Phenylephrine @ 2:10 PM        Slit Lamp and Fundus Exam    Slit Lamp Exam      Right Left   Lids/Lashes Dermatochalasis - upper lid, Meibomian gland dysfunction Dermatochalasis - upper lid, Meibomian gland dysfunction   Conjunctiva/Sclera White and quiet White and quiet   Cornea Mild Arcus Mild Arcus, EBMD, 1+ Punctate epithelial erosions   Anterior Chamber Deep and quiet Deep and quiet   Iris Round and dilated, No NVI, PPM nasally Round and dilated, No NVI   Lens 3+ Nuclear sclerosis,  2+ Cortical cataract 2-3+ Nuclear sclerosis, 2+ Cortical cataract   Vitreous Normal Normal       Fundus Exam      Right Left   Disc Pink and Sharp Normal, No NVD   C/D Ratio 0.45 0.4   Macula blunted foveal reflex with persistent cystic changes, perifoveal exudates, MAs, exudates superior and temporal to fovea, Retinal pigment epithelial mottling blunted foveal reflex and trace cystic changes, exudates temporal to fovea, MAs, mild Retinal pigment epithelial mottling   Vessels Tortuous, AV crossing changes, Copper wiring Tortuous with early veinous beading    Periphery Attached, scattered MAs, DBH, exudates inferior to disc - improving attached, CWS nasally and inferiorly, DBH, exudates          IMAGING AND PROCEDURES  Imaging and Procedures for 12/10/17  OCT, Retina - OU - Both Eyes       Right Eye Quality was borderline. Central Foveal Thickness: 360. Progression has improved. Findings include abnormal foveal contour, intraretinal hyper-reflective material, no SRF, intraretinal fluid (Interval improvement in IRF).   Left Eye Quality was borderline. Central Foveal Thickness: 238. Progression has been stable. Findings include abnormal foveal contour, intraretinal fluid, intraretinal hyper-reflective material, no SRF (Mild persistant IRF).   Notes Images taken, stored on drive  Diagnosis / Impression:  DME OU (OD>OS) OD: interval improvement in IRF OS: mild stable IRF   Clinical management:  See below  Abbreviations: NFP - Normal foveal profile. CME - cystoid macular edema. PED - pigment epithelial detachment. IRF - intraretinal fluid. SRF - subretinal fluid. EZ - ellipsoid zone. ERM - epiretinal membrane. ORA - outer retinal atrophy. ORT - outer retinal tubulation. SRHM - subretinal hyper-reflective material         Intravitreal Injection, Pharmacologic Agent - OD - Right Eye       Time Out 07/14/2018. 3:15 PM. Confirmed correct patient, procedure, site, and patient  consented.   Anesthesia Topical anesthesia was used. Anesthetic medications included Lidocaine 2%, Proparacaine 0.5%.   Procedure Preparation included 5% betadine to ocular surface, eyelid speculum. A 27 gauge needle was used.   Injection:  4 mg triamcinolone acetonide 40 MG/ML   NDC: 1610-9604-54, Lot: [id number], Expiration date: 10/09/2018   Route: Intravitreal, Site: Right Eye, Waste: 0.09 mL  Post-op Post injection exam found visual acuity of at least counting fingers. The patient tolerated the procedure well. There were no complications. The patient received written and verbal post procedure care education.                 ASSESSMENT/PLAN:    ICD-10-CM   1. Moderate nonproliferative diabetic retinopathy of both eyes with macular edema associated with type 2 diabetes mellitus (HCC) A54.0981E11.3313 Intravitreal Injection, Pharmacologic Agent - OD - Right Eye  2. Hypertensive retinopathy of both eyes H35.033   3. 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 Retinal edema H35.81 OCT, Retina - OU - Both Eyes    1. Moderate nonproliferative diabetic retinopathy, both eyes - Diabetic macular edema, both eyes -- relatively mild - S/P IVA OD #1 (12.17.18), #2 (01.28.19), #3 (02.27.19), #4 (04.03.19), #5 (05.01.19) - in reviewing her case, there was minimal response to IVA in OCT or BCVA - S/P IVTA OD #1 (05.29.19), #2 (07.24.19), #3 (09.04.19) - slightly better response with IVTA - today, mild interval improvement in IRF on OCT - FA at prior visit did not show significant Mas amenable to focal laser -- will hold off on focal laser for now - BCVA decreased to 20/40 OD, OS improved to at 20/25 - IOP 17 mmHg - recommend IVTA OD #4 today, 12.04.19 - pt wishes to proceed - RBA of procedure discussed, questions answered - informed consent obtained and signed - see procedure note - f/u 6 weeks for IOP check  2. Hypertensive retinopathy OU - discussed importance of  tight BP control - monitor  3. Nuclear and Cortical Cataracts OU;  - The symptoms of cataract, surgical options, and treatments and risks were discussed with patient. - discussed diagnosis and progression - approaching visual significance and may be etiology of 20/40 vision OD - will send letter to Dr. Charise Killianotter to set up cataract evaluation  4. Retinal edema as above   Ophthalmic Meds Ordered this visit:  No orders of the defined types were placed in this encounter.      Return in about 6 weeks (around 08/25/2018) for F/U NPDR OU, DFE, OCT.  There are no Patient Instructions on file for this visit.   Explained the diagnoses, plan, and follow up with the patient and they expressed understanding.  Patient expressed understanding of the importance of proper follow up care.   This document serves as a record of services personally performed by Karie ChimeraBrian G. Duglas Heier, MD, PhD. It was created on their behalf by Laurian BrimAmanda Brown, OA, an ophthalmic assistant. The creation of this record is the provider's dictation and/or activities during the visit.    Electronically signed by: Laurian BrimAmanda Brown, OA  12.03.19 12:36 AM    Karie ChimeraBrian G. Anyla Israelson, M.D., Ph.D. Diseases & Surgery of the Retina and Vitreous [facility]  I have reviewed the above documentation for accuracy and completeness, and I agree with the above. Karie ChimeraBrian G. Lavra Imler, M.D., Ph.D. 07/16/18 12:36 AM    Abbreviations: M myopia (nearsighted); A astigmatism; H hyperopia (farsighted); P presbyopia; Mrx spectacle prescription;  CTL contact lenses; OD right eye; OS left eye; OU both eyes  XT exotropia; ET esotropia; PEK punctate epithelial keratitis; PEE punctate epithelial erosions; DES dry eye syndrome; MGD meibomian gland dysfunction; ATs artificial tears; PFAT's preservative free artificial tears; NSC nuclear sclerotic cataract; PSC posterior subcapsular cataract; ERM epi-retinal membrane; PVD posterior vitreous detachment; RD  retinal detachment; DM diabetes mellitus; DR diabetic  retinopathy; NPDR non-proliferative diabetic retinopathy; PDR proliferative diabetic retinopathy; CSME clinically significant macular edema; DME diabetic macular edema; dbh dot blot hemorrhages; CWS cotton wool spot; POAG primary open angle glaucoma; C/D cup-to-disc ratio; HVF humphrey visual field; GVF goldmann visual field; OCT optical coherence tomography; IOP intraocular pressure; BRVO Branch retinal vein occlusion; CRVO central retinal vein occlusion; CRAO central retinal artery occlusion; BRAO branch retinal artery occlusion; RT retinal tear; SB scleral buckle; PPV pars plana vitrectomy; VH Vitreous hemorrhage; PRP panretinal laser photocoagulation; IVK intravitreal kenalog; VMT vitreomacular traction; MH Macular hole;  NVD neovascularization of the disc; NVE neovascularization elsewhere; AREDS age related eye disease study; ARMD age related macular degeneration; POAG primary open angle glaucoma; EBMD epithelial/anterior basement membrane dystrophy; ACIOL anterior chamber intraocular lens; IOL intraocular lens; PCIOL posterior chamber intraocular lens; Phaco/IOL phacoemulsification with intraocular lens placement; [place] photorefractive keratectomy; LASIK laser assisted in situ keratomileusis; HTN hypertension; DM diabetes mellitus; COPD chronic obstructive pulmonary disease

## 2018-07-14 ENCOUNTER — Ambulatory Visit (INDEPENDENT_AMBULATORY_CARE_PROVIDER_SITE_OTHER): Payer: PPO | Admitting: Ophthalmology

## 2018-07-14 ENCOUNTER — Encounter (INDEPENDENT_AMBULATORY_CARE_PROVIDER_SITE_OTHER): Payer: Self-pay | Admitting: Ophthalmology

## 2018-07-14 DIAGNOSIS — H3581 Retinal edema: Secondary | ICD-10-CM | POA: Diagnosis not present

## 2018-07-14 DIAGNOSIS — H35033 Hypertensive retinopathy, bilateral: Secondary | ICD-10-CM

## 2018-07-14 DIAGNOSIS — E113313 Type 2 diabetes mellitus with moderate nonproliferative diabetic retinopathy with macular edema, bilateral: Secondary | ICD-10-CM | POA: Diagnosis not present

## 2018-07-14 DIAGNOSIS — H25813 Combined forms of age-related cataract, bilateral: Secondary | ICD-10-CM | POA: Diagnosis not present

## 2018-07-16 ENCOUNTER — Encounter (INDEPENDENT_AMBULATORY_CARE_PROVIDER_SITE_OTHER): Payer: Self-pay | Admitting: Ophthalmology

## 2018-07-16 MED ORDER — TRIAMCINOLONE ACETONIDE 40 MG/ML IO SUSP
4.0000 mg | INTRAOCULAR | Status: DC
  Administered 2018-07-16: 4 mg via INTRAVITREAL

## 2018-08-20 DIAGNOSIS — E7800 Pure hypercholesterolemia, unspecified: Secondary | ICD-10-CM | POA: Diagnosis not present

## 2018-08-20 DIAGNOSIS — I10 Essential (primary) hypertension: Secondary | ICD-10-CM | POA: Diagnosis not present

## 2018-08-20 DIAGNOSIS — Z299 Encounter for prophylactic measures, unspecified: Secondary | ICD-10-CM | POA: Diagnosis not present

## 2018-08-20 DIAGNOSIS — E1165 Type 2 diabetes mellitus with hyperglycemia: Secondary | ICD-10-CM | POA: Diagnosis not present

## 2018-08-20 DIAGNOSIS — R251 Tremor, unspecified: Secondary | ICD-10-CM | POA: Diagnosis not present

## 2018-08-20 DIAGNOSIS — Z789 Other specified health status: Secondary | ICD-10-CM | POA: Diagnosis not present

## 2018-08-20 DIAGNOSIS — Z6826 Body mass index (BMI) 26.0-26.9, adult: Secondary | ICD-10-CM | POA: Diagnosis not present

## 2018-08-24 NOTE — Progress Notes (Signed)
[facility] - Clinic Note  08/25/2018     CHIEF COMPLAINT Patient presents for Retina Follow Up   HISTORY OF PRESENT ILLNESS: Carolyn Hunter is a 68 y.o. female who presents to the clinic today for:   HPI    Retina Follow Up    Patient presents with  Diabetic Retinopathy.  In both eyes.  This started 5 months ago.  Severity is mild.  Since onset it is stable.  I, the attending physician,  performed the HPI with the patient and updated documentation appropriately.          Comments    F/U NPDR OU. Patient states her vision has been "good", denies new visual onsets/issues. BS 105 this am , [facility].       Last edited by Rennis ChrisZamora, Zacari Radick, MD on 08/25/2018  2:15 PM. (History)    pt states Dr. Charise Killianotter never set her up for cataract sx, pt states vision OD seems a little bit worse than before   Referring physician: No referring provider defined for this encounter.  HISTORICAL INFORMATION:   Selected notes from the MEDICAL RECORD NUMBER Referral from Dr. Fabienne BrunsM. Cotter for DM exam;  Ocular Hx-  PMH- DM; high chol; HTN; Last A1C- 7.1 (9.7.18);    CURRENT MEDICATIONS: No current outpatient medications on file. (Ophthalmic Drugs)   Current Facility-Administered Medications (Ophthalmic Drugs)  Medication Route  . triamcinolone acetonide (TRIESENCE) 40 MG/ML subtenons injection 4 mg Intravitreal  . triamcinolone acetonide (TRIESENCE) 40 MG/ML subtenons injection 4 mg Intravitreal  . triamcinolone acetonide (TRIESENCE) 40 MG/ML subtenons injection 4 mg Intravitreal  . triamcinolone acetonide (TRIESENCE) 40 MG/ML subtenons injection 4 mg Intravitreal   Current Outpatient Medications (Other)  Medication Sig  . aspirin EC 81 MG tablet Take 1 tablet (81 mg total) by mouth daily.  Marland Kitchen. lisinopril (PRINIVIL,ZESTRIL) 10 MG tablet Take 1 tablet (10 mg total) by mouth daily.  . metFORMIN (GLUCOPHAGE) 500 MG tablet Take 1 tablet (500 mg total) by mouth daily  with breakfast.  . simvastatin (ZOCOR) 40 MG tablet Take 1 tablet (40 mg total) by mouth at bedtime.   No current facility-administered medications for this visit.  (Other)      REVIEW OF SYSTEMS: ROS    Positive for: Endocrine, Eyes   Negative for: Constitutional, Gastrointestinal, Neurological, Skin, Genitourinary, Musculoskeletal, HENT, Cardiovascular, Respiratory, Psychiatric, Allergic/Imm, Heme/Lymph   Last edited by Eldridge ScotKendrick, Glenda, LPN on 1/19/14781/15/2020  1:43 PM. (History)       ALLERGIES No Known Allergies  PAST MEDICAL HISTORY Past Medical History:  Diagnosis Date  . Cataract   . Diabetes mellitus without complication (HCC)   . Hyperlipidemia   . Hypertension    Past Surgical History:  Procedure Laterality Date  . CESAREAN SECTION     1988    FAMILY HISTORY Family History  Problem Relation Age of Onset  . Cancer Mother        breast and colon cancer  . Diabetes Brother   . Diabetes Son   . Asthma Father        COPD  . Diabetes Son     SOCIAL HISTORY Social History   Tobacco Use  . Smoking status: Never Smoker  . Smokeless tobacco: Never Used  Substance Use Topics  . Alcohol use: No  . Drug use: No         OPHTHALMIC EXAM:  Base Eye Exam    Visual Acuity (Snellen - Linear)  Right Left   Dist [place] 20/40 +2 20/30 +2   Dist ph [place] NI NI       Tonometry (Tonopen, 1:50 PM)      Right Left   Pressure 14 14       Pupils      Dark Light Shape React APD   Right 3 2 Round Brisk None   Left 3 2 Round Brisk None       Visual Fields (Counting fingers)      Left Right    Full Full       Extraocular Movement      Right Left    Full, Ortho Full, Ortho       Neuro/Psych    Oriented x3:  Yes   Mood/Affect:  Normal       Dilation    Both eyes:  1.0% Mydriacyl, 2.5% Phenylephrine @ 1:50 PM        Slit Lamp and Fundus Exam    Slit Lamp Exam      Right Left   Lids/Lashes Dermatochalasis - upper lid, Meibomian gland dysfunction  Dermatochalasis - upper lid, Meibomian gland dysfunction   Conjunctiva/Sclera White and quiet White and quiet   Cornea Mild Arcus Mild Arcus, EBMD, 1+ Punctate epithelial erosions   Anterior Chamber Deep and quiet Deep and quiet   Iris Round and dilated, No NVI, PPM nasally Round and dilated, No NVI   Lens 3+ Nuclear sclerosis, 2+ Cortical cataract 2-3+ Nuclear sclerosis, 2+ Cortical cataract   Vitreous Normal Normal       Fundus Exam      Right Left   Disc Pink and Sharp Normal, No NVD   C/D Ratio 0.4 0.4   Macula blunted foveal reflex with persistent cystic changes, exudates improved, Retinal pigment epithelial mottling blunted foveal reflex and trace cystic changes, exudates temporal to fovea, MAs, mild Retinal pigment epithelial mottling   Vessels Tortuous, AV crossing changes, Copper wiring Vascular attenuation, Tortuous   Periphery Attached, scattered MAs, DBH, exudates inferior to disc - improving attached, CWS inferior to disc with surrounding Mas, scattered DBH          IMAGING AND PROCEDURES  Imaging and Procedures for 12/10/17  OCT, Retina - OU - Both Eyes       Right Eye Quality was borderline. Central Foveal Thickness: 347. Progression has improved. Findings include abnormal foveal contour, intraretinal hyper-reflective material, no SRF, intraretinal fluid (Mild Interval improvement in IRF).   Left Eye Quality was borderline. Central Foveal Thickness: 339. Progression has been stable. Findings include abnormal foveal contour, intraretinal fluid, intraretinal hyper-reflective material, no SRF (Interval increase in IRF and IRHM).   Notes Images taken, stored on drive  Diagnosis / Impression:  DME OU (OD>OS) OD: interval improvement in IRF OS: mild interval increase in IRF/IRHM   Clinical management:  See below  Abbreviations: NFP - Normal foveal profile. CME - cystoid macular edema. PED - pigment epithelial detachment. IRF - intraretinal fluid. SRF -  subretinal fluid. EZ - ellipsoid zone. ERM - epiretinal membrane. ORA - outer retinal atrophy. ORT - outer retinal tubulation. SRHM - subretinal hyper-reflective material                  ASSESSMENT/PLAN:    ICD-10-CM   1. Moderate nonproliferative diabetic retinopathy of both eyes with macular edema associated with type 2 diabetes mellitus (HCC) U98.1191E11.3313   2. Hypertensive retinopathy of both eyes H35.033   3. Combined forms of age-related cataract of  both eyes H25.813   4. Retinal edema H35.81 OCT, Retina - OU - Both Eyes    1. Moderate nonproliferative diabetic retinopathy, both eyes - Diabetic macular edema, both eyes -- relatively mild - S/P IVA OD #1 (12.17.18), #2 (01.28.19), #3 (02.27.19), #4 (04.03.19), #5 (05.01.19) - in reviewing her case, there was minimal response to IVA in OCT or BCVA - S/P IVTA OD #1 (05.29.19), #2 (07.24.19), #3 (09.04.19), #4 (12.04.19) - slightly better response with IVTA - today, mild interval improvement in IRF on OCT - FA at prior visit did not show significant Mas amenable to focal laser -- will hold off on focal laser for now - BCVA stable at 20/40 OD, slightly decreased to 20/30 OS - IOP 17 mmHg - will hold off on IVTA OD today - f/u 6 weeks -- DFE/OCT possible IVTA  2. Hypertensive retinopathy OU - discussed importance of tight BP control - monitor  3. Nuclear and Cortical Cataracts OU;  - The symptoms of cataract, surgical options, and treatments and risks were discussed with patient. - discussed diagnosis and progression - approaching visual significance and may be significant contributor to 20/40 vision OD - will refer to Dr. Alto [place] or Dr. June Leap in [place] for cat eval  4. Retinal edema as above   Ophthalmic Meds Ordered this visit:  No orders of the defined types were placed in this encounter.      Return in about 6 weeks (around 10/06/2018) for f/u NPDR OU, DFE, OCT.  There are no Patient Instructions on file for  this visit.   Explained the diagnoses, plan, and follow up with the patient and they expressed understanding.  Patient expressed understanding of the importance of proper follow up care.   This document serves as a record of services personally performed by Karie Chimera, MD, PhD. It was created on their behalf by Laurian Brim, OA, an ophthalmic assistant. The creation of this record is the provider's dictation and/or activities during the visit.    Electronically signed by: Laurian Brim, OA  01.14.2020 1:33 AM    Karie Chimera, M.D., Ph.D. Diseases & Surgery of the Retina and Vitreous [facility]  I have reviewed the above documentation for accuracy and completeness, and I agree with the above. Karie Chimera, M.D., Ph.D. 08/29/18 1:38 AM     Abbreviations: M myopia (nearsighted); A astigmatism; H hyperopia (farsighted); P presbyopia; Mrx spectacle prescription;  CTL contact lenses; OD right eye; OS left eye; OU both eyes  XT exotropia; ET esotropia; PEK punctate epithelial keratitis; PEE punctate epithelial erosions; DES dry eye syndrome; MGD meibomian gland dysfunction; ATs artificial tears; PFAT's preservative free artificial tears; NSC nuclear sclerotic cataract; PSC posterior subcapsular cataract; ERM epi-retinal membrane; PVD posterior vitreous detachment; RD retinal detachment; DM diabetes mellitus; DR diabetic retinopathy; NPDR non-proliferative diabetic retinopathy; PDR proliferative diabetic retinopathy; CSME clinically significant macular edema; DME diabetic macular edema; dbh dot blot hemorrhages; CWS cotton wool spot; POAG primary open angle glaucoma; C/D cup-to-disc ratio; HVF humphrey visual field; GVF goldmann visual field; OCT optical coherence tomography; IOP intraocular pressure; BRVO Branch retinal vein occlusion; CRVO central retinal vein occlusion; CRAO central retinal artery occlusion; BRAO branch retinal artery occlusion; RT retinal tear; SB  scleral buckle; PPV pars plana vitrectomy; VH Vitreous hemorrhage; PRP panretinal laser photocoagulation; IVK intravitreal kenalog; VMT vitreomacular traction; MH Macular hole;  NVD neovascularization of the disc; NVE neovascularization elsewhere; AREDS age related eye disease study; ARMD age related macular  degeneration; POAG primary open angle glaucoma; EBMD epithelial/anterior basement membrane dystrophy; ACIOL anterior chamber intraocular lens; IOL intraocular lens; PCIOL posterior chamber intraocular lens; Phaco/IOL phacoemulsification with intraocular lens placement; [place] photorefractive keratectomy; LASIK laser assisted in situ keratomileusis; HTN hypertension; DM diabetes mellitus; COPD chronic obstructive pulmonary disease

## 2018-08-25 ENCOUNTER — Ambulatory Visit (INDEPENDENT_AMBULATORY_CARE_PROVIDER_SITE_OTHER): Payer: PPO | Admitting: Ophthalmology

## 2018-08-25 ENCOUNTER — Encounter (INDEPENDENT_AMBULATORY_CARE_PROVIDER_SITE_OTHER): Payer: Self-pay | Admitting: Ophthalmology

## 2018-08-25 DIAGNOSIS — E113313 Type 2 diabetes mellitus with moderate nonproliferative diabetic retinopathy with macular edema, bilateral: Secondary | ICD-10-CM | POA: Diagnosis not present

## 2018-08-25 DIAGNOSIS — H35033 Hypertensive retinopathy, bilateral: Secondary | ICD-10-CM | POA: Diagnosis not present

## 2018-08-25 DIAGNOSIS — H3581 Retinal edema: Secondary | ICD-10-CM

## 2018-08-25 DIAGNOSIS — H25813 Combined forms of age-related cataract, bilateral: Secondary | ICD-10-CM

## 2018-08-29 ENCOUNTER — Encounter (INDEPENDENT_AMBULATORY_CARE_PROVIDER_SITE_OTHER): Payer: Self-pay | Admitting: Ophthalmology

## 2018-09-07 DIAGNOSIS — Z1231 Encounter for screening mammogram for malignant neoplasm of breast: Secondary | ICD-10-CM | POA: Diagnosis not present

## 2018-09-13 DIAGNOSIS — H25813 Combined forms of age-related cataract, bilateral: Secondary | ICD-10-CM | POA: Diagnosis not present

## 2018-09-13 DIAGNOSIS — E113313 Type 2 diabetes mellitus with moderate nonproliferative diabetic retinopathy with macular edema, bilateral: Secondary | ICD-10-CM | POA: Diagnosis not present

## 2018-09-22 DIAGNOSIS — I10 Essential (primary) hypertension: Secondary | ICD-10-CM | POA: Diagnosis not present

## 2018-09-22 DIAGNOSIS — E1165 Type 2 diabetes mellitus with hyperglycemia: Secondary | ICD-10-CM | POA: Diagnosis not present

## 2018-09-22 DIAGNOSIS — Z6827 Body mass index (BMI) 27.0-27.9, adult: Secondary | ICD-10-CM | POA: Diagnosis not present

## 2018-09-22 DIAGNOSIS — Z299 Encounter for prophylactic measures, unspecified: Secondary | ICD-10-CM | POA: Diagnosis not present

## 2018-10-05 NOTE — Progress Notes (Signed)
[facility] Clinic Note  10/06/2018     CHIEF COMPLAINT Patient presents for Retina Follow Up   HISTORY OF PRESENT ILLNESS: Carolyn Hunter is a 68 y.o. female who presents to the clinic today for:   HPI    Retina Follow Up    Patient presents with  Diabetic Retinopathy.  In both eyes.  This started 4 months ago.  Severity is mild.  Duration of 6 weeks.  Since onset it is gradually worsening.  I, the attending physician,  performed the HPI with the patient and updated documentation appropriately.          Comments    68 y/o female pt here for 6 wk f/u for NPDR w/mac edema OU.  VA OD slightly worse over the past several days.  No change in [place] OS.  Denies pain, flashes, floaters.  No gtts.  BS 110 this a.m.  A1C 5.6 3 wks ago.       Last edited by Bernarda Caffey, MD on 10/06/2018  1:27 PM. (History)    pt states she has her right eye schedule for cat sx on March 9th with Dr. Marisa Hua and her left eye is schedule for 2 weeks after that   Referring physician: No referring provider defined for this encounter.  HISTORICAL INFORzMATION:   Selected notes from the MEDICAL RECORD NUMBER Referral from Dr. Jeb Levering for DM exam;  Ocular Hx-  PMH- DM; high chol; HTN; Last A1C- 7.1 (9.7.18);    CURRENT MEDICATIONS: No current outpatient medications on file. (Ophthalmic Drugs)   Current Facility-Administered Medications (Ophthalmic Drugs)  Medication Route  . triamcinolone acetonide (TRIESENCE) 40 MG/ML subtenons injection 4 mg Intravitreal  . triamcinolone acetonide (TRIESENCE) 40 MG/ML subtenons injection 4 mg Intravitreal  . triamcinolone acetonide (TRIESENCE) 40 MG/ML subtenons injection 4 mg Intravitreal  . triamcinolone acetonide (TRIESENCE) 40 MG/ML subtenons injection 4 mg Intravitreal   Current Outpatient Medications (Other)  Medication Sig  . aspirin EC 81 MG tablet Take 1 tablet (81 mg total) by mouth daily.  Marland Kitchen lisinopril (PRINIVIL,ZESTRIL) 10 MG tablet  Take 1 tablet (10 mg total) by mouth daily.  . metFORMIN (GLUCOPHAGE) 500 MG tablet Take 1 tablet (500 mg total) by mouth daily with breakfast.  . simvastatin (ZOCOR) 40 MG tablet Take 1 tablet (40 mg total) by mouth at bedtime.   Current Facility-Administered Medications (Other)  Medication Route  . triamcinolone acetonide (KENALOG-40) injection 4 mg Intravitreal      REVIEW OF SYSTEMS: ROS    Positive for: Endocrine, Eyes   Negative for: Constitutional, Gastrointestinal, Neurological, Skin, Genitourinary, Musculoskeletal, HENT, Cardiovascular, Respiratory, Psychiatric, Allergic/Imm, Heme/Lymph   Last edited by Matthew Folks, COA on 10/06/2018 12:55 PM. (History)       ALLERGIES No Known Allergies  PAST MEDICAL HISTORY Past Medical History:  Diagnosis Date  . Cataract   . Diabetes mellitus without complication ([place])   . Diabetic retinopathy ([place])    NPDR OU  . Hyperlipidemia   . Hypertension    Past Surgical History:  Procedure Laterality Date  . CESAREAN SECTION     1988    FAMILY HISTORY Family History  Problem Relation Age of Onset  . Cancer Mother        breast and colon cancer  . Diabetes Brother   . Diabetes Son   . Asthma Father        COPD  . Diabetes Son     SOCIAL HISTORY Social  History   Tobacco Use  . Smoking status: Never Smoker  . Smokeless tobacco: Never Used  Substance Use Topics  . Alcohol use: No  . Drug use: No         OPHTHALMIC EXAM:  Base Eye Exam    Visual Acuity (Snellen - Linear)      Right Left   Dist [place] 20/50 -2 20/40 -   Dist ph [place] NI NI       Tonometry (Tonopen, 12:58 PM)      Right Left   Pressure 11 13       Pupils      Dark Light Shape React APD   Right 3 2 Round Brisk None   Left 3 2 Round Brisk None       Visual Fields (Counting fingers)      Left Right    Full Full       Extraocular Movement      Right Left    Full, Ortho Full, Ortho       Neuro/Psych    Oriented x3:  Yes    Mood/Affect:  Normal       Dilation    Both eyes:  1.0% Mydriacyl, 2.5% Phenylephrine @ 12:58 PM        Slit Lamp and Fundus Exam    Slit Lamp Exam      Right Left   Lids/Lashes Dermatochalasis - upper lid, Meibomian gland dysfunction Dermatochalasis - upper lid, Meibomian gland dysfunction   Conjunctiva/Sclera White and quiet White and quiet   Cornea Mild Arcus Mild Arcus, EBMD, 1+ Punctate epithelial erosions   Anterior Chamber Deep and quiet Deep and quiet   Iris Round and dilated, No NVI, PPM nasally Round and dilated, No NVI   Lens 3+ Nuclear sclerosis with brunescence 2+ Cortical cataract 3+ Nuclear sclerosis, 2+ Cortical cataract   Vitreous Normal Normal       Fundus Exam      Right Left   Disc Pink and Sharp Pink and Sharp   C/D Ratio 0.4 0.4   Macula blunted foveal reflex with central cystic changes, scattered MA blunted foveal reflex, focal IRH, exudate and edema temporal to fovea   Vessels Vascular attenuation, Tortuous Vascular attenuation, Tortuous   Periphery Attached, scattered MAs, DBH, exudates inferior to disc - improving attached, IRH temporal           IMAGING AND PROCEDURES  Imaging and Procedures for 12/10/17  OCT, Retina - OU - Both Eyes       Right Eye Quality was borderline. Central Foveal Thickness: 373. Progression has worsened. Findings include abnormal foveal contour, intraretinal hyper-reflective material, no SRF, intraretinal fluid (Mild Interval increase in IRF).   Left Eye Quality was borderline. Central Foveal Thickness: 331. Progression has improved. Findings include abnormal foveal contour, intraretinal fluid, intraretinal hyper-reflective material, no SRF, vitreomacular adhesion , epiretinal membrane (Interval improvement in foveal profile and IRF).   Notes Images taken, stored on drive  Diagnosis / Impression:  DME OU (OD>OS) OD: interval increase in IRF OS: Interval improvement in foveal profile and IRF   Clinical  management:  See below  Abbreviations: NFP - Normal foveal profile. CME - cystoid macular edema. PED - pigment epithelial detachment. IRF - intraretinal fluid. SRF - subretinal fluid. EZ - ellipsoid zone. ERM - epiretinal membrane. ORA - outer retinal atrophy. ORT - outer retinal tubulation. SRHM - subretinal hyper-reflective material         Intravitreal Injection, Pharmacologic Agent -  OD - Right Eye       Time Out 10/06/2018. 1:49 PM. Confirmed correct patient, procedure, site, and patient consented.   Anesthesia Topical anesthesia was used. Anesthetic medications included Lidocaine 2%, Tetracaine 0.5%.   Procedure Preparation included 5% betadine to ocular surface. A 27 gauge needle was used.   Injection:  4 mg triamcinolone acetonide (KENALOG-40) injection   NDC: 2774-1287-86, Lot: [id number], Expiration date: 07/11/2019   Route: Intravitreal, Site: Right Eye  Post-op Post injection exam found visual acuity of at least counting fingers. The patient tolerated the procedure well. There were no complications. The patient received written and verbal post procedure care education.                 ASSESSMENT/PLAN:    ICD-10-CM   1. Moderate nonproliferative diabetic retinopathy of both eyes with macular edema associated with type 2 diabetes mellitus (HCC) B09.6283 Intravitreal Injection, Pharmacologic Agent - OD - Right Eye    triamcinolone acetonide (KENALOG-40) injection 4 mg  2. Hypertensive retinopathy of both eyes H35.033   3. 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 Retinal edema H35.81 OCT, Retina - OU - Both Eyes    1. Moderate nonproliferative diabetic retinopathy, both eyes - Diabetic macular edema, both eyes -- relatively mild - S/P IVA OD #1 (12.17.18), #2 (01.28.19), #3 (02.27.19), #4 (04.03.19), #5 (05.01.19) - in reviewing her case, there was minimal response to IVA in OCT or BCVA - S/P IVTA OD #1 (05.29.19), #2 (07.24.19), #3  (09.04.19), #4 (12.04.19) - slightly better response with IVTA - today, mild interval increase in IRF on OCT OD, OS improved - FA at prior visit did not show significant Mas amenable to focal laser -- will hold off on focal laser for now - BCVA down to 20/50 from 20/40 OD, to 20/40 from 20/30 OS - IOP 11 mmHg OD - recommend repeat IVTA OD today - f/u 6 weeks -- DFE/OCT possible IVTA  2. Hypertensive retinopathy OU - discussed importance of tight BP control - monitor  3. Nuclear and Cortical Cataracts OU;  - The symptoms of cataract, surgical options, and treatments and risks were discussed with patient. - discussed diagnosis and progression - approaching visual significance and may be significant contributor to 20/40 vision OD - surgery scheduled with Dr. Marisa Hua March 2020  4. Retinal edema as above   Ophthalmic Meds Ordered this visit:  Meds ordered this encounter  Medications  . triamcinolone acetonide (KENALOG-40) injection 4 mg       Return for f/u week of April 20 NPDR OU, DFE, OCT.  There are no Patient Instructions on file for this visit.   Explained the diagnoses, plan, and follow up with the patient and they expressed understanding.  Patient expressed understanding of the importance of proper follow up care.   This document serves as a record of services personally performed by Gardiner Sleeper, MD, PhD. It was created on their behalf by Ernest Mallick, OA, an ophthalmic assistant. The creation of this record is the provider's dictation and/or activities during the visit.    Electronically signed by: Ernest Mallick, OA  02.25.2020 11:46 PM   Gardiner Sleeper, M.D., Ph.D. Diseases & Surgery of the Retina and Vitreous [facility]  I have reviewed the above documentation for accuracy and completeness, and I agree with the above. Gardiner Sleeper, M.D., Ph.D. 10/06/18 11:49 PM    Abbreviations: M myopia (nearsighted); A astigmatism;  H hyperopia  (farsighted); P presbyopia; Mrx spectacle prescription;  CTL contact lenses; OD right eye; OS left eye; OU both eyes  XT exotropia; ET esotropia; PEK punctate epithelial keratitis; PEE punctate epithelial erosions; DES dry eye syndrome; MGD meibomian gland dysfunction; ATs artificial tears; PFAT's preservative free artificial tears; [place] nuclear sclerotic cataract; PSC posterior subcapsular cataract; ERM epi-retinal membrane; PVD posterior vitreous detachment; RD retinal detachment; DM diabetes mellitus; DR diabetic retinopathy; NPDR non-proliferative diabetic retinopathy; PDR proliferative diabetic retinopathy; CSME clinically significant macular edema; DME diabetic macular edema; dbh dot blot hemorrhages; CWS cotton wool spot; POAG primary open angle glaucoma; C/D cup-to-disc ratio; HVF humphrey visual field; GVF goldmann visual field; OCT optical coherence tomography; IOP intraocular pressure; BRVO Branch retinal vein occlusion; CRVO central retinal vein occlusion; CRAO central retinal artery occlusion; BRAO branch retinal artery occlusion; RT retinal tear; SB scleral buckle; PPV pars plana vitrectomy; VH Vitreous hemorrhage; PRP panretinal laser photocoagulation; IVK intravitreal kenalog; VMT vitreomacular traction; MH Macular hole;  NVD neovascularization of the disc; NVE neovascularization elsewhere; AREDS age related eye disease study; ARMD age related macular degeneration; POAG primary open angle glaucoma; EBMD epithelial/anterior basement membrane dystrophy; ACIOL anterior chamber intraocular lens; IOL intraocular lens; PCIOL posterior chamber intraocular lens; Phaco/IOL phacoemulsification with intraocular lens placement; [place] photorefractive keratectomy; LASIK laser assisted in situ keratomileusis; HTN hypertension; DM diabetes mellitus; COPD chronic obstructive pulmonary disease

## 2018-10-06 ENCOUNTER — Encounter (INDEPENDENT_AMBULATORY_CARE_PROVIDER_SITE_OTHER): Payer: Self-pay | Admitting: Ophthalmology

## 2018-10-06 ENCOUNTER — Ambulatory Visit (INDEPENDENT_AMBULATORY_CARE_PROVIDER_SITE_OTHER): Payer: PPO | Admitting: Ophthalmology

## 2018-10-06 ENCOUNTER — Encounter (INDEPENDENT_AMBULATORY_CARE_PROVIDER_SITE_OTHER): Payer: PPO | Admitting: Ophthalmology

## 2018-10-06 DIAGNOSIS — H25813 Combined forms of age-related cataract, bilateral: Secondary | ICD-10-CM

## 2018-10-06 DIAGNOSIS — H35033 Hypertensive retinopathy, bilateral: Secondary | ICD-10-CM

## 2018-10-06 DIAGNOSIS — E113313 Type 2 diabetes mellitus with moderate nonproliferative diabetic retinopathy with macular edema, bilateral: Secondary | ICD-10-CM

## 2018-10-06 DIAGNOSIS — H3581 Retinal edema: Secondary | ICD-10-CM | POA: Diagnosis not present

## 2018-10-06 MED ORDER — TRIAMCINOLONE ACETONIDE 40 MG/ML IJ SUSP FOR KALEIDOSCOPE
4.0000 mg | INTRAMUSCULAR | Status: DC
  Administered 2018-10-06: 4 mg via INTRAVITREAL

## 2018-10-06 NOTE — Patient Instructions (Signed)
Your procedure is scheduled on:   Report to [facility] at       AM.  Call this number if you have problems the morning of surgery: [phone]   Do not eat food or drink liquids :After Midnight.      Take these medicines the morning of surgery with A SIP OF WATER: None   Do not wear jewelry, make-up or nail polish.  Do not wear lotions, powders, or perfumes. You may wear deodorant.  Do not shave 48 hours prior to surgery.  Do not bring valuables to the hospital.  Contacts, dentures or bridgework may not be worn into surgery.  Leave suitcase in the car. After surgery it may be brought to your room.  For patients admitted to the hospital, checkout time is 11:00 AM the day of discharge.   Patients discharged the day of surgery will not be allowed to drive home.  :     Please read over the following fact sheets that you were given: Coughing and Deep Breathing, Surgical Site Infection Prevention, Anesthesia Post-op Instructions and Care and Recovery After Surgery    Cataract A cataract is a clouding of the lens of the eye. When a lens becomes cloudy, vision is reduced based on the degree and nature of the clouding. Many cataracts reduce vision to some degree. Some cataracts make people more near-sighted as they develop. Other cataracts increase glare. Cataracts that are ignored and become worse can sometimes look white. The white color can be seen through the pupil. CAUSES   Aging. However, cataracts may occur at any age, even in newborns.   Certain drugs.   Trauma to the eye.   Certain diseases such as diabetes.   Specific eye diseases such as chronic inflammation inside the eye or a sudden attack of a rare form of glaucoma.   Inherited or acquired medical problems.  SYMPTOMS   Gradual, progressive drop in vision in the affected eye.   Severe, rapid visual loss. This most often happens when trauma is the cause.  DIAGNOSIS  To detect a cataract, an eye doctor examines the lens.  Cataracts are best diagnosed with an exam of the eyes with the pupils enlarged (dilated) by drops.  TREATMENT  For an early cataract, vision may improve by using different eyeglasses or stronger lighting. If that does not help your vision, surgery is the only effective treatment. A cataract needs to be surgically removed when vision loss interferes with your everyday activities, such as driving, reading, or watching TV. A cataract may also have to be removed if it prevents examination or treatment of another eye problem. Surgery removes the cloudy lens and usually replaces it with a substitute lens (intraocular lens, IOL).  At a time when both you and your doctor agree, the cataract will be surgically removed. If you have cataracts in both eyes, only one is usually removed at a time. This allows the operated eye to heal and be out of danger from any possible problems after surgery (such as infection or poor wound healing). In rare cases, a cataract may be doing damage to your eye. In these cases, your caregiver may advise surgical removal right away. The vast majority of people who have cataract surgery have better vision afterward. HOME CARE INSTRUCTIONS  If you are not planning surgery, you may be asked to do the following:  Use different eyeglasses.   Use stronger or brighter lighting.   Ask your eye doctor about reducing your  medicine dose or changing medicines if it is thought that a medicine caused your cataract. Changing medicines does not make the cataract go away on its own.   Become familiar with your surroundings. Poor vision can lead to injury. Avoid bumping into things on the affected side. You are at a higher risk for tripping or falling.   Exercise extreme care when driving or operating machinery.   Wear sunglasses if you are sensitive to bright light or experiencing problems with glare.  SEEK IMMEDIATE MEDICAL CARE IF:   You have a worsening or sudden vision loss.   You notice  redness, swelling, or increasing pain in the eye.   You have a fever.  Document Released: 07/28/2005 Document Revised: 07/17/2011 Document Reviewed: 03/21/2011 [facility] Patient Information 2012 [place], [place].PATIENT INSTRUCTIONS POST-ANESTHESIA  IMMEDIATELY FOLLOWING SURGERY:  Do not drive or operate machinery for the first twenty four hours after surgery.  Do not make any important decisions for twenty four hours after surgery or while taking narcotic pain medications or sedatives.  If you develop intractable nausea and vomiting or a severe headache please notify your doctor immediately.  FOLLOW-UP:  Please make an appointment with your surgeon as instructed. You do not need to follow up with anesthesia unless specifically instructed to do so.  WOUND CARE INSTRUCTIONS (if applicable):  Keep a dry clean dressing on the anesthesia/puncture wound site if there is drainage.  Once the wound has quit draining you may leave it open to air.  Generally you should leave the bandage intact for twenty four hours unless there is drainage.  If the epidural site drains for more than 36-48 hours please call the anesthesia department.  QUESTIONS?:  Please feel free to call your physician or the hospital operator if you have any questions, and they will be happy to assist you.

## 2018-10-11 DIAGNOSIS — H25811 Combined forms of age-related cataract, right eye: Secondary | ICD-10-CM | POA: Diagnosis not present

## 2018-10-13 ENCOUNTER — Encounter (HOSPITAL_COMMUNITY)
Admission: RE | Admit: 2018-10-13 | Discharge: 2018-10-13 | Disposition: A | Discharge status: Home / Self Care | Payer: PPO | Source: Ambulatory Visit | Attending: Ophthalmology | Admitting: Ophthalmology

## 2018-10-13 ENCOUNTER — Other Ambulatory Visit: Payer: Self-pay

## 2018-10-13 ENCOUNTER — Encounter (HOSPITAL_COMMUNITY): Payer: Self-pay

## 2018-10-13 DIAGNOSIS — Z01812 Encounter for preprocedural laboratory examination: Secondary | ICD-10-CM | POA: Insufficient documentation

## 2018-10-13 LAB — HEMOGLOBIN A1C
Hgb A1c MFr Bld: 5.6 % (ref 4.8–5.6)
Mean Plasma Glucose: 114.02 mg/dL

## 2018-10-13 LAB — BASIC METABOLIC PANEL
ANION GAP: 7 (ref 5–15)
BUN: 16 mg/dL (ref 8–23)
CO2: 26 mmol/L (ref 22–32)
Calcium: 9.7 mg/dL (ref 8.9–10.3)
Chloride: 104 mmol/L (ref 98–111)
Creatinine, Ser: 0.76 mg/dL (ref 0.44–1.00)
GFR calc Af Amer: >60 mL/min (ref >60)
GLUCOSE: 92 mg/dL (ref 70–99)
Potassium: 4 mmol/L (ref 3.5–5.1)
Sodium: 137 mmol/L (ref 135–145)

## 2018-10-13 LAB — GLUCOSE, CAPILLARY: Glucose-Capillary: 89 mg/dL (ref 70–99)

## 2018-10-15 MED ORDER — PHENYLEPHRINE HCL 2.5 % OP SOLN
OPHTHALMIC | Status: AC
  Filled 2018-10-15: qty 15

## 2018-10-15 MED ORDER — LIDOCAINE HCL 3.5 % OP GEL
OPHTHALMIC | Status: AC
  Filled 2018-10-15: qty 1

## 2018-10-15 MED ORDER — TETRACAINE HCL 0.5 % OP SOLN
OPHTHALMIC | Status: AC
  Filled 2018-10-15: qty 4

## 2018-10-15 MED ORDER — NEOMYCIN-POLYMYXIN-DEXAMETH 3.5-10000-0.1 OP SUSP
OPHTHALMIC | Status: AC
  Filled 2018-10-15: qty 5

## 2018-10-15 MED ORDER — CYCLOPENTOLATE-PHENYLEPHRINE 0.2-1 % OP SOLN
OPHTHALMIC | Status: AC
  Filled 2018-10-15: qty 2

## 2018-10-15 MED ORDER — LIDOCAINE HCL (PF) 1 % IJ SOLN
INTRAMUSCULAR | Status: AC
  Filled 2018-10-15: qty 2

## 2018-10-15 NOTE — Anesthesia Preprocedure Evaluation (Addendum)
Anesthesia Evaluation  Patient identified by MRN, date of birth, ID band Patient awake    Reviewed: Allergy & Precautions, NPO status , Patient's Chart, lab work & pertinent test results  Airway Mallampati: III  TM Distance: >3 FB Neck ROM: Full    Dental no notable dental hx. (+) Partial Upper   Pulmonary neg pulmonary ROS,    Pulmonary exam normal breath sounds clear to auscultation       Cardiovascular Exercise Tolerance: Good hypertension, Pt. on medications negative cardio ROS Normal cardiovascular examI Rhythm:Regular Rate:Normal     Neuro/Psych Pt with known resting tremor  negative neurological ROS  negative psych ROS   GI/Hepatic negative GI ROS, Neg liver ROS,   Endo/Other  negative endocrine ROSdiabetes, Well Controlled, Type 2, Oral Hypoglycemic Agents  Renal/GU negative Renal ROS  negative genitourinary   Musculoskeletal negative musculoskeletal ROS (+)   Abdominal   Peds negative pediatric ROS (+)  Hematology negative hematology ROS (+)   Anesthesia Other Findings   Reproductive/Obstetrics negative OB ROS                            Anesthesia Physical Anesthesia Plan  ASA: II  Anesthesia Plan: MAC   Post-op Pain Management:    Induction: Intravenous  PONV Risk Score and Plan:   Airway Management Planned: Nasal Cannula  Additional Equipment:   Intra-op Plan:   Post-operative Plan:   Informed Consent: I have reviewed the patients History and Physical, chart, labs and discussed the procedure including the risks, benefits and alternatives for the proposed anesthesia with the patient or authorized representative who has indicated his/her understanding and acceptance.       Plan Discussed with: CRNA  Anesthesia Plan Comments:         Anesthesia Quick Evaluation

## 2018-10-18 ENCOUNTER — Ambulatory Visit (HOSPITAL_COMMUNITY)
Admission: RE | Admit: 2018-10-18 | Discharge: 2018-10-18 | Disposition: A | Discharge status: Home / Self Care | Payer: PPO | Attending: Ophthalmology | Admitting: Ophthalmology

## 2018-10-18 ENCOUNTER — Ambulatory Visit (HOSPITAL_COMMUNITY): Payer: PPO | Admitting: Anesthesiology

## 2018-10-18 ENCOUNTER — Encounter (HOSPITAL_COMMUNITY)
Admission: RE | Disposition: A | Discharge status: Home / Self Care | Payer: Self-pay | Source: Home / Self Care | Attending: Ophthalmology

## 2018-10-18 ENCOUNTER — Encounter (HOSPITAL_COMMUNITY): Payer: Self-pay

## 2018-10-18 DIAGNOSIS — E1136 Type 2 diabetes mellitus with diabetic cataract: Secondary | ICD-10-CM | POA: Insufficient documentation

## 2018-10-18 DIAGNOSIS — H25811 Combined forms of age-related cataract, right eye: Secondary | ICD-10-CM | POA: Diagnosis not present

## 2018-10-18 DIAGNOSIS — Z7984 Long term (current) use of oral hypoglycemic drugs: Secondary | ICD-10-CM | POA: Diagnosis not present

## 2018-10-18 DIAGNOSIS — Z7982 Long term (current) use of aspirin: Secondary | ICD-10-CM | POA: Diagnosis not present

## 2018-10-18 DIAGNOSIS — Z79899 Other long term (current) drug therapy: Secondary | ICD-10-CM | POA: Diagnosis not present

## 2018-10-18 DIAGNOSIS — I10 Essential (primary) hypertension: Secondary | ICD-10-CM | POA: Insufficient documentation

## 2018-10-18 HISTORY — PX: CATARACT EXTRACTION W/PHACO: SHX586

## 2018-10-18 LAB — GLUCOSE, CAPILLARY: Glucose-Capillary: 115 mg/dL — ABNORMAL HIGH (ref 70–99)

## 2018-10-18 SURGERY — PHACOEMULSIFICATION, CATARACT, WITH IOL INSERTION
Anesthesia: Monitor Anesthesia Care | Site: Eye | Laterality: Right

## 2018-10-18 MED ORDER — CYCLOPENTOLATE-PHENYLEPHRINE 0.2-1 % OP SOLN
1.0000 [drp] | OPHTHALMIC | Status: AC
  Administered 2018-10-18 (×3): 1 [drp] via OPHTHALMIC

## 2018-10-18 MED ORDER — PROVISC 10 MG/ML IO SOLN
INTRAOCULAR | Status: DC | PRN
  Administered 2018-10-18: 0.85 mL via INTRAOCULAR

## 2018-10-18 MED ORDER — SODIUM HYALURONATE 23 MG/ML IO SOLN
INTRAOCULAR | Status: DC | PRN
  Administered 2018-10-18: 0.6 mL via INTRAOCULAR

## 2018-10-18 MED ORDER — TETRACAINE HCL 0.5 % OP SOLN
1.0000 [drp] | OPHTHALMIC | Status: AC
  Administered 2018-10-18 (×3): 1 [drp] via OPHTHALMIC

## 2018-10-18 MED ORDER — LIDOCAINE HCL (PF) 1 % IJ SOLN
INTRAOCULAR | Status: DC | PRN
  Administered 2018-10-18: 1 mL via OPHTHALMIC

## 2018-10-18 MED ORDER — BSS IO SOLN
INTRAOCULAR | Status: DC | PRN
  Administered 2018-10-18: 15 mL

## 2018-10-18 MED ORDER — MIDAZOLAM HCL 2 MG/2ML IJ SOLN
INTRAMUSCULAR | Status: AC
  Filled 2018-10-18: qty 2

## 2018-10-18 MED ORDER — MIDAZOLAM HCL 5 MG/5ML IJ SOLN
INTRAMUSCULAR | Status: DC | PRN
  Administered 2018-10-18 (×2): 1 mg via INTRAVENOUS

## 2018-10-18 MED ORDER — LIDOCAINE HCL 3.5 % OP GEL
1.0000 "application " | Freq: Once | OPHTHALMIC | Status: AC
  Administered 2018-10-18: 1 via OPHTHALMIC

## 2018-10-18 MED ORDER — POVIDONE-IODINE 5 % OP SOLN
OPHTHALMIC | Status: DC | PRN
  Administered 2018-10-18: 1 via OPHTHALMIC

## 2018-10-18 MED ORDER — EPINEPHRINE PF 1 MG/ML IJ SOLN
INTRAOCULAR | Status: DC | PRN
  Administered 2018-10-18: 500 mL

## 2018-10-18 MED ORDER — NEOMYCIN-POLYMYXIN-DEXAMETH 3.5-10000-0.1 OP SUSP
OPHTHALMIC | Status: DC | PRN
  Administered 2018-10-18: 1 [drp] via OPHTHALMIC

## 2018-10-18 MED ORDER — PHENYLEPHRINE HCL 2.5 % OP SOLN
1.0000 [drp] | OPHTHALMIC | Status: AC
  Administered 2018-10-18 (×3): 1 [drp] via OPHTHALMIC

## 2018-10-18 MED ORDER — LACTATED RINGERS IV SOLN: INTRAVENOUS | Status: DC | PRN

## 2018-10-18 SURGICAL SUPPLY — 14 items
CLOTH BEACON ORANGE TIMEOUT ST (SAFETY) ×2 IMPLANT
EYE SHIELD UNIVERSAL CLEAR (GAUZE/BANDAGES/DRESSINGS) ×2 IMPLANT
GLOVE BIOGEL PI IND STRL 6.5 (GLOVE) ×1 IMPLANT
GLOVE BIOGEL PI IND STRL 7.0 (GLOVE) ×1 IMPLANT
GLOVE BIOGEL PI INDICATOR 6.5 (GLOVE) ×1
GLOVE BIOGEL PI INDICATOR 7.0 (GLOVE) ×1
LENS ALC ACRYL/TECN (Ophthalmic Related) ×2 IMPLANT
NEEDLE HYPO 18GX1.5 BLUNT FILL (NEEDLE) ×2 IMPLANT
PAD ARMBOARD 7.5X6 YLW CONV (MISCELLANEOUS) ×2 IMPLANT
SYR TB 1ML LL NO SAFETY (SYRINGE) ×2 IMPLANT
TAPE SURG TRANSPORE 1 IN (GAUZE/BANDAGES/DRESSINGS) ×1 IMPLANT
TAPE SURGICAL TRANSPORE 1 IN (GAUZE/BANDAGES/DRESSINGS) ×1
VISCOELASTIC ADDITIONAL (OPHTHALMIC RELATED) ×2 IMPLANT
WATER STERILE IRR 250ML POUR (IV SOLUTION) ×2 IMPLANT

## 2018-10-18 NOTE — Op Note (Signed)
Date of procedure: 10/18/18  Pre-operative diagnosis: Visually significant age-related cataract, Right Eye (H25.811)  Post-operative diagnosis: Visually significant age-related cataract, Right Eye  Procedure: Removal of cataract via phacoemulsification and insertion of intra-ocular lens [employer] PCB00  +20.0D into the capsular bag of the Right Eye  Attending surgeon: Gerda Diss. Findlay Dagher, MD, MA  Anesthesia: MAC, Topical Akten  Complications: None  Estimated Blood Loss: <51m (minimal)  Specimens: None  Implants: As above  Indications:  Visually significant age-related cataract, Right Eye  Procedure:  The patient was seen and identified in the pre-operative area. The operative eye was identified and dilated.  The operative eye was marked.  Topical anesthesia was administered to the operative eye.     The patient was then to the operative suite and placed in the supine position.  A timeout was performed confirming the patient, procedure to be performed, and all other relevant information.   The patient's face was prepped and draped in the usual fashion for intra-ocular surgery.  A lid speculum was placed into the operative eye and the surgical microscope moved into place and focused.  A superotemporal paracentesis was created using a 20 gauge paracentesis blade.  Shugarcaine was injected into the anterior chamber.  Viscoelastic was injected into the anterior chamber.  A temporal clear-corneal main wound incision was created using a 2.423mmicrokeratome.  A continuous curvilinear capsulorrhexis was initiated using an irrigating cystitome and completed using capsulorrhexis forceps.  Hydrodissection and hydrodeliniation were performed.  Viscoelastic was injected into the anterior chamber.  A phacoemulsification handpiece and a chopper as a second instrument were used to remove the nucleus and epinucleus. The irrigation/aspiration handpiece was used to remove any remaining cortical  material.   The capsular bag was reinflated with viscoelastic, checked, and found to be intact.  The intraocular lens was inserted into the capsular bag and dialed into place using a Kuglen hook.  The irrigation/aspiration handpiece was used to remove any remaining viscoelastic.  The clear corneal wound and paracentesis wounds were then hydrated and checked with Weck-Cels to be watertight.  The lid-speculum and drape was removed, and the patient's face was cleaned with a wet and dry 4x4.  Maxitrol was instilled in the eye before a clear shield was taped over the eye. The patient was taken to the post-operative care unit in good condition, having tolerated the procedure well.  Post-Op Instructions: [facility] and will receive all other orders and instructions.

## 2018-10-18 NOTE — Anesthesia Postprocedure Evaluation (Signed)
Anesthesia Post Note  Patient: Carolyn Hunter  Procedure(s) Performed: CATARACT EXTRACTION PHACO AND INTRAOCULAR LENS PLACEMENT (IOC) (Right Eye)  Patient location during evaluation: PACU Anesthesia Type: MAC Level of consciousness: awake and patient cooperative Pain management: pain level controlled Vital Signs Assessment: post-procedure vital signs reviewed and stable Respiratory status: spontaneous breathing, nonlabored ventilation and respiratory function stable Cardiovascular status: blood pressure returned to baseline Postop Assessment: no apparent nausea or vomiting Anesthetic complications: no     Last Vitals:  Vitals:   10/18/18 0657 10/18/18 0740  BP: (!) 188/103 (!) 178/96  Pulse: 93 84  Resp: 16 16  Temp: 36.8 C   SpO2: 98% 99%    Last Pain:  Vitals:   10/18/18 0657  TempSrc: Oral  PainSc: 0-No pain                 Gabryel Files J

## 2018-10-18 NOTE — H&P (Signed)
The H and P was reviewed and updated. The patient was examined.  No changes were found after exam.  The surgical eye was marked.  

## 2018-10-18 NOTE — Transfer of Care (Signed)
Immediate Anesthesia Transfer of Care Note  Patient: Carolyn Hunter  Procedure(s) Performed: CATARACT EXTRACTION PHACO AND INTRAOCULAR LENS PLACEMENT (IOC) (Right Eye)  Patient Location: Short Stay  Anesthesia Type:MAC  Level of Consciousness: awake and patient cooperative  Airway & Oxygen Therapy: Patient Spontanous Breathing  Post-op Assessment: Report given to RN and Post -op Vital signs reviewed and stable  Post vital signs: Reviewed and stable  Last Vitals:  Vitals Value Taken Time  BP    Temp    Pulse    Resp    SpO2      Last Pain:  Vitals:   10/18/18 0657  TempSrc: Oral  PainSc: 0-No pain      Patients Stated Pain Goal: 8 (55/37/48 2707)  Complications: No apparent anesthesia complications

## 2018-10-18 NOTE — Discharge Instructions (Addendum)
PATIENT INSTRUCTIONS °POST-ANESTHESIA ° °IMMEDIATELY FOLLOWING SURGERY:  Do not drive or operate machinery for the first twenty four hours after surgery.  Do not make any important decisions for twenty four hours after surgery or while taking narcotic pain medications or sedatives.  If you develop intractable nausea and vomiting or a severe headache please notify your doctor immediately. ° °FOLLOW-UP:  Please make an appointment with your surgeon as instructed. You do not need to follow up with anesthesia unless specifically instructed to do so. ° °WOUND CARE INSTRUCTIONS (if applicable):  Keep a dry clean dressing on the anesthesia/puncture wound site if there is drainage.  Once the wound has quit draining you may leave it open to air.  Generally you should leave the bandage intact for twenty four hours unless there is drainage.  If the epidural site drains for more than 36-48 hours please call the anesthesia department. ° °QUESTIONS?:  Please feel free to call your physician or the hospital operator if you have any questions, and they will be happy to assist you.    ° ° °Please discharge patient when stable, will follow up today with Dr. Wrzosek at the [facility] office immediately following discharge.  Leave shield in place until visit.  All paperwork with discharge instructions will be given at the office. ° °

## 2018-10-19 ENCOUNTER — Encounter (HOSPITAL_COMMUNITY): Payer: Self-pay | Admitting: Ophthalmology

## 2018-10-26 ENCOUNTER — Encounter (HOSPITAL_COMMUNITY): Payer: Self-pay

## 2018-10-26 ENCOUNTER — Other Ambulatory Visit: Payer: Self-pay

## 2018-10-27 ENCOUNTER — Encounter (HOSPITAL_COMMUNITY)
Admission: RE | Admit: 2018-10-27 | Discharge: 2018-10-27 | Disposition: A | Discharge status: Home / Self Care | Payer: PPO | Source: Ambulatory Visit | Attending: Ophthalmology | Admitting: Ophthalmology

## 2018-11-01 ENCOUNTER — Encounter (HOSPITAL_COMMUNITY): Admission: RE | Payer: Self-pay | Source: Home / Self Care

## 2018-11-01 ENCOUNTER — Ambulatory Visit (HOSPITAL_COMMUNITY): Admission: RE | Admit: 2018-11-01 | Payer: PPO | Source: Home / Self Care | Admitting: Ophthalmology

## 2018-11-01 SURGERY — PHACOEMULSIFICATION, CATARACT, WITH IOL INSERTION
Anesthesia: Monitor Anesthesia Care | Laterality: Left

## 2018-12-06 ENCOUNTER — Encounter (INDEPENDENT_AMBULATORY_CARE_PROVIDER_SITE_OTHER): Payer: PPO | Admitting: Ophthalmology

## 2018-12-27 DIAGNOSIS — E1165 Type 2 diabetes mellitus with hyperglycemia: Secondary | ICD-10-CM | POA: Diagnosis not present

## 2018-12-27 DIAGNOSIS — E7800 Pure hypercholesterolemia, unspecified: Secondary | ICD-10-CM | POA: Diagnosis not present

## 2018-12-27 DIAGNOSIS — Z299 Encounter for prophylactic measures, unspecified: Secondary | ICD-10-CM | POA: Diagnosis not present

## 2018-12-27 DIAGNOSIS — Z6827 Body mass index (BMI) 27.0-27.9, adult: Secondary | ICD-10-CM | POA: Diagnosis not present

## 2018-12-27 DIAGNOSIS — I10 Essential (primary) hypertension: Secondary | ICD-10-CM | POA: Diagnosis not present

## 2019-01-04 NOTE — Progress Notes (Signed)
[facility] - Clinic Note  01/05/2019     CHIEF COMPLAINT Patient presents for Retina Follow Up   HISTORY OF PRESENT ILLNESS: Carolyn Hunter is a 68 y.o. female who presents to the clinic today for:   HPI    Retina Follow Up    Patient presents with  Diabetic Retinopathy.  In both eyes.  This started months ago.  Severity is moderate.  Duration of 3 months.  Since onset it is stable.  I, the attending physician,  performed the HPI with the patient and updated documentation appropriately.          Comments    68 y/o female pt here for 3 mo f/u for NPDR OU.  VA improved OD following cat sx w/Dr. June Leap a couple of months ago.  No change in [place] OS.  Denies pain, flashes, floaters.  No gtts.  BS 110 this morning.  A1C 6.2 1 week ago.       Last edited by Rennis Chris, MD on 01/05/2019  2:36 PM. (History)    pt states she had cataract sx on her right eye in March, but states her left eye had to be postponed to June due to COVID, pt states her vision is better since the sx  Referring physician: Kirstie Peri, MD [address] [place], [place] [address]  HISTORICAL INFORzMATION:   Selected notes from the MEDICAL RECORD NUMBER Referral from Dr. Fabienne Bruns for DM exam;  Ocular Hx-  PMH- DM; high chol; HTN; Last A1C- 7.1 (9.7.18);    CURRENT MEDICATIONS: No current outpatient medications on file. (Ophthalmic Drugs)   No current facility-administered medications for this visit.  (Ophthalmic Drugs)   Current Outpatient Medications (Other)  Medication Sig  . aspirin EC 81 MG tablet Take 1 tablet (81 mg total) by mouth daily.  Marland Kitchen lisinopril (PRINIVIL,ZESTRIL) 10 MG tablet Take 1 tablet (10 mg total) by mouth daily.  . metFORMIN (GLUCOPHAGE) 500 MG tablet Take 1 tablet (500 mg total) by mouth daily with breakfast.  . simvastatin (ZOCOR) 40 MG tablet Take 1 tablet (40 mg total) by mouth at bedtime.   No current facility-administered medications for this visit.  (Other)       REVIEW OF SYSTEMS: ROS    Positive for: Endocrine, Eyes   Negative for: Constitutional, Gastrointestinal, Neurological, Skin, Genitourinary, Musculoskeletal, HENT, Cardiovascular, Respiratory, Psychiatric, Allergic/Imm, Heme/Lymph   Last edited by Celine Mans, COA on 01/05/2019  1:54 PM. (History)       ALLERGIES No Known Allergies  PAST MEDICAL HISTORY Past Medical History:  Diagnosis Date  . Cataract    OS  . Diabetes mellitus without complication (HCC)   . Diabetic retinopathy (HCC)    NPDR OU  . Hyperlipidemia   . Hypertension   . Hypertensive retinopathy    OU   Past Surgical History:  Procedure Laterality Date  . CATARACT EXTRACTION W/PHACO Right 10/18/2018   Procedure: CATARACT EXTRACTION PHACO AND INTRAOCULAR LENS PLACEMENT (IOC);  Surgeon: Fabio Pierce, MD;  Location: [facility];  Service: Ophthalmology;  Laterality: Right;  CDE: 8.11  . CESAREAN SECTION     1988  . EYE SURGERY Right    Cataract extraction OD    FAMILY HISTORY Family History  Problem Relation Age of Onset  . Cancer Mother        breast and colon cancer  . Diabetes Brother   . Diabetes Son   . Asthma Father  COPD  . Diabetes Son     SOCIAL HISTORY Social History   Tobacco Use  . Smoking status: Never Smoker  . Smokeless tobacco: Never Used  Substance Use Topics  . Alcohol use: No  . Drug use: No         OPHTHALMIC EXAM:  Base Eye Exam    Visual Acuity (Snellen - Linear)      Right Left   Dist [place] 20/25 20/40   Dist ph [place] NI 20/30       Tonometry (Tonopen, 1:58 PM)      Right Left   Pressure 15 14       Pupils      Dark Light Shape React APD   Right 3 2 Round Brisk None   Left 3 2 Round Brisk None       Visual Fields (Counting fingers)      Left Right    Full Full       Extraocular Movement      Right Left    Full, Ortho Full, Ortho       Neuro/Psych    Oriented x3:  Yes   Mood/Affect:  Normal       Dilation    Both eyes:  1.0%  Mydriacyl, 2.5% Phenylephrine @ 1:59 PM        Slit Lamp and Fundus Exam    Slit Lamp Exam      Right Left   Lids/Lashes Dermatochalasis - upper lid, Meibomian gland dysfunction Dermatochalasis - upper lid, Meibomian gland dysfunction   Conjunctiva/Sclera White and quiet White and quiet   Cornea Mild Arcus Mild Arcus, EBMD, 1+ Punctate epithelial erosions   Anterior Chamber Deep and quiet Deep and quiet   Iris Round and dilated, No NVI, PPM nasally Round and dilated, No NVI   Lens PC IOL in good position 3+ Nuclear sclerosis, 2+ Cortical cataract   Vitreous vitreous condensations/kenalog greatest IT quadrant Normal       Fundus Exam      Right Left   Disc Pink and Sharp Pink and Sharp   C/D Ratio 0.4 0.4   Macula blunted foveal reflex with central cystic changes, scattered MA good foveal reflex, focal IRH, exudate and edema temporal to fovea   Vessels Vascular attenuation, Tortuous Vascular attenuation, Tortuous   Periphery Attached, scattered MAs, DBH, exudates inferior to disc - improving attached, IRH temporal, blot heme 0600 below disc          IMAGING AND PROCEDURES  Imaging and Procedures for 12/10/17  OCT, Retina - OU - Both Eyes       Right Eye Quality was borderline. Central Foveal Thickness: 378. Progression has worsened. Findings include abnormal foveal contour, intraretinal hyper-reflective material, no SRF, intraretinal fluid, vitreomacular adhesion  (persistent IRF, slight increase in [facility]).   Left Eye Quality was borderline. Central Foveal Thickness: 338. Progression has improved. Findings include abnormal foveal contour, intraretinal fluid, intraretinal hyper-reflective material, no SRF, vitreomacular adhesion , epiretinal membrane (Persistent IRF/IRHM).   Notes Images taken, stored on drive  Diagnosis / Impression:  DME OU (OD>OS) OD: persistent IRF, slight increase in IRHM OS: Persistent IRF/IRHM   Clinical management:  See below  Abbreviations:  NFP - Normal foveal profile. CME - cystoid macular edema. PED - pigment epithelial detachment. IRF - intraretinal fluid. SRF - subretinal fluid. EZ - ellipsoid zone. ERM - epiretinal membrane. ORA - outer retinal atrophy. ORT - outer retinal tubulation. SRHM - subretinal hyper-reflective material  Intravitreal Injection, Pharmacologic Agent - OD - Right Eye       Time Out 01/05/2019. 3:08 PM. Confirmed correct patient, procedure, site, and patient consented.   Anesthesia Topical anesthesia was used. Anesthetic medications included Lidocaine 2%, Proparacaine 0.5%.   Procedure Preparation included 5% betadine to ocular surface, eyelid speculum. A supplied needle was used.   Injection:  1.25 mg Bevacizumab (AVASTIN) SOLN   NDC: 86168-372-90, Lot: [id number] , Expiration date: 01/21/2019   Route: Intravitreal, Site: Right Eye, Waste: 0 mL  Post-op Post injection exam found visual acuity of at least counting fingers. The patient tolerated the procedure well. There were no complications. The patient received written and verbal post procedure care education.                 ASSESSMENT/PLAN:    ICD-10-CM   1. Moderate nonproliferative diabetic retinopathy of both eyes with macular edema associated with type 2 diabetes mellitus (HCC) 03/23/2019 Intravitreal Injection, Pharmacologic Agent - OD - Right Eye    Bevacizumab (AVASTIN) SOLN 1.25 mg  2. Hypertensive retinopathy of both eyes H35.033   3. Combined forms of age-related cataract of left eye H25.812   4. Pseudophakia of right eye Z96.1   5. Retinal edema H35.81 OCT, Retina - OU - Both Eyes    1. Moderate nonproliferative diabetic retinopathy, both eyes  - Diabetic macular edema, both eyes -- relatively mild  - S/P IVA OD #1 (12.17.18), #2 (01.28.19), #3 (02.27.19), #4 (04.03.19), #5 (05.01.19)  - in reviewing her case, there was minimal response to IVA in OCT or BCVA  - S/P IVTA OD #1 (05.29.19), #2 (07.24.19), #3  (09.04.19), #4 (12.04.19), #5 (02.26.20)  - slightly better response with IVTA  - delayed follow up due to COVID-19 restrictions - 3 months instead of 6 weeks  - today, persistent IRF, slight increase in IRHM on OCT OD, OS persistent IRF/IRHM  - FA at prior visit did not show significant Mas amenable to focal laser -- will hold off on focal laser for now  - BCVA improved to 20/25 from 20/50 OD, OS stable at 20/30 (significant improvement secondary to cataract surgery)  - IOP 15 mmHg OD  - recommend repeat IVA OD today due to residual kenalog remaining in eye on exam today  - RBA of procedure discussed, questions answered  - informed consent obtained and signed  - see procedure note  - f/u 6 weeks -- DFE/OCT possible IVTA  2. Hypertensive retinopathy OU  - discussed importance of tight BP control  - monitor  3. Nuclear and Cortical Cataracts OS  - The symptoms of cataract, surgical options, and treatments and risks were discussed with patient.  - discussed diagnosis and progression  - approaching visual significance   - surgery OS was scheduled at the end of March, but was delayed to June due to COVID-19 restrictions  4. Pseudophakia OD  - s/p CE/IOL OD (Dr. July, March 2020)  - beautiful surgery, doing well  - monitor  5. Retinal edema as above   Ophthalmic Meds Ordered this visit:  Meds ordered this encounter  Medications  . Bevacizumab (AVASTIN) SOLN 1.25 mg       Return in about 6 weeks (around 02/16/2019) for f/u NPDR OU, DFE, OCT.  There are no Patient Instructions on file for this visit.   Explained the diagnoses, plan, and follow up with the patient and they expressed understanding.  Patient expressed understanding of the importance of proper follow up care.  This document serves as a record of services personally performed by Karie Chimera, MD, PhD. It was created on their behalf by Laurian Brim, OA, an ophthalmic assistant. The creation of this record is the  provider's dictation and/or activities during the visit.    Electronically signed by: Laurian Brim, OA  05.26.2020 8:40 PM    Karie Chimera, M.D., Ph.D. Diseases & Surgery of the Retina and Vitreous [facility]  I have reviewed the above documentation for accuracy and completeness, and I agree with the above. Karie Chimera, M.D., Ph.D. 01/05/19 8:42 PM   Abbreviations: M myopia (nearsighted); A astigmatism; H hyperopia (farsighted); P presbyopia; Mrx spectacle prescription;  CTL contact lenses; OD right eye; OS left eye; OU both eyes  XT exotropia; ET esotropia; PEK punctate epithelial keratitis; PEE punctate epithelial erosions; DES dry eye syndrome; MGD meibomian gland dysfunction; ATs artificial tears; PFAT's preservative free artificial tears; NSC nuclear sclerotic cataract; PSC posterior subcapsular cataract; ERM epi-retinal membrane; PVD posterior vitreous detachment; RD retinal detachment; DM diabetes mellitus; DR diabetic retinopathy; NPDR non-proliferative diabetic retinopathy; PDR proliferative diabetic retinopathy; CSME clinically significant macular edema; DME diabetic macular edema; dbh dot blot hemorrhages; CWS cotton wool spot; POAG primary open angle glaucoma; C/D cup-to-disc ratio; HVF humphrey visual field; GVF goldmann visual field; OCT optical coherence tomography; IOP intraocular pressure; BRVO Branch retinal vein occlusion; CRVO central retinal vein occlusion; CRAO central retinal artery occlusion; BRAO branch retinal artery occlusion; RT retinal tear; SB scleral buckle; PPV pars plana vitrectomy; VH Vitreous hemorrhage; PRP panretinal laser photocoagulation; IVK intravitreal kenalog; VMT vitreomacular traction; MH Macular hole;  NVD neovascularization of the disc; NVE neovascularization elsewhere; AREDS age related eye disease study; ARMD age related macular degeneration; POAG primary open angle glaucoma; EBMD epithelial/anterior basement membrane  dystrophy; ACIOL anterior chamber intraocular lens; IOL intraocular lens; PCIOL posterior chamber intraocular lens; Phaco/IOL phacoemulsification with intraocular lens placement; PRK photorefractive keratectomy; LASIK laser assisted in situ keratomileusis; HTN hypertension; DM diabetes mellitus; COPD chronic obstructive pulmonary disease

## 2019-01-05 ENCOUNTER — Ambulatory Visit (INDEPENDENT_AMBULATORY_CARE_PROVIDER_SITE_OTHER): Payer: PPO | Admitting: Ophthalmology

## 2019-01-05 ENCOUNTER — Other Ambulatory Visit: Payer: Self-pay

## 2019-01-05 ENCOUNTER — Encounter (INDEPENDENT_AMBULATORY_CARE_PROVIDER_SITE_OTHER): Payer: Self-pay | Admitting: Ophthalmology

## 2019-01-05 DIAGNOSIS — H35033 Hypertensive retinopathy, bilateral: Secondary | ICD-10-CM | POA: Diagnosis not present

## 2019-01-05 DIAGNOSIS — E113313 Type 2 diabetes mellitus with moderate nonproliferative diabetic retinopathy with macular edema, bilateral: Secondary | ICD-10-CM | POA: Diagnosis not present

## 2019-01-05 DIAGNOSIS — H3581 Retinal edema: Secondary | ICD-10-CM | POA: Diagnosis not present

## 2019-01-05 DIAGNOSIS — H25812 Combined forms of age-related cataract, left eye: Secondary | ICD-10-CM

## 2019-01-05 DIAGNOSIS — Z961 Presence of intraocular lens: Secondary | ICD-10-CM | POA: Diagnosis not present

## 2019-01-05 MED ORDER — BEVACIZUMAB CHEMO INJECTION 1.25MG/0.05ML SYRINGE FOR KALEIDOSCOPE
1.2500 mg | INTRAVITREAL | Status: AC | PRN
  Administered 2019-01-05: 1.25 mg via INTRAVITREAL

## 2019-01-10 DIAGNOSIS — H25812 Combined forms of age-related cataract, left eye: Secondary | ICD-10-CM | POA: Diagnosis not present

## 2019-01-12 ENCOUNTER — Other Ambulatory Visit: Payer: Self-pay

## 2019-01-12 ENCOUNTER — Encounter (HOSPITAL_COMMUNITY)
Admission: RE | Admit: 2019-01-12 | Discharge: 2019-01-12 | Disposition: A | Discharge status: Home / Self Care | Payer: PPO | Source: Ambulatory Visit | Attending: Ophthalmology | Admitting: Ophthalmology

## 2019-01-13 ENCOUNTER — Encounter (HOSPITAL_COMMUNITY): Payer: Self-pay

## 2019-01-13 ENCOUNTER — Other Ambulatory Visit (HOSPITAL_COMMUNITY)
Admission: RE | Admit: 2019-01-13 | Discharge: 2019-01-13 | Disposition: A | Discharge status: Home / Self Care | Payer: PPO | Source: Ambulatory Visit | Attending: Ophthalmology | Admitting: Ophthalmology

## 2019-01-13 ENCOUNTER — Other Ambulatory Visit: Payer: Self-pay

## 2019-01-13 DIAGNOSIS — Z1159 Encounter for screening for other viral diseases: Secondary | ICD-10-CM | POA: Insufficient documentation

## 2019-01-14 LAB — NOVEL CORONAVIRUS, NAA (HOSP ORDER, SEND-OUT TO REF LAB; TAT 18-24 HRS): SARS-CoV-2, NAA: NOT DETECTED

## 2019-01-17 ENCOUNTER — Ambulatory Visit (HOSPITAL_COMMUNITY)
Admission: RE | Admit: 2019-01-17 | Discharge: 2019-01-17 | Disposition: A | Discharge status: Home / Self Care | Payer: PPO | Attending: Ophthalmology | Admitting: Ophthalmology

## 2019-01-17 ENCOUNTER — Encounter (HOSPITAL_COMMUNITY)
Admission: RE | Disposition: A | Discharge status: Home / Self Care | Payer: Self-pay | Source: Home / Self Care | Attending: Ophthalmology

## 2019-01-17 ENCOUNTER — Ambulatory Visit (HOSPITAL_COMMUNITY): Payer: PPO | Admitting: Anesthesiology

## 2019-01-17 ENCOUNTER — Other Ambulatory Visit: Payer: Self-pay

## 2019-01-17 ENCOUNTER — Encounter (HOSPITAL_COMMUNITY): Payer: Self-pay | Admitting: *Deleted

## 2019-01-17 DIAGNOSIS — Z7982 Long term (current) use of aspirin: Secondary | ICD-10-CM | POA: Insufficient documentation

## 2019-01-17 DIAGNOSIS — H25812 Combined forms of age-related cataract, left eye: Secondary | ICD-10-CM | POA: Diagnosis not present

## 2019-01-17 DIAGNOSIS — E1136 Type 2 diabetes mellitus with diabetic cataract: Secondary | ICD-10-CM | POA: Insufficient documentation

## 2019-01-17 DIAGNOSIS — Z7984 Long term (current) use of oral hypoglycemic drugs: Secondary | ICD-10-CM | POA: Diagnosis not present

## 2019-01-17 DIAGNOSIS — I10 Essential (primary) hypertension: Secondary | ICD-10-CM | POA: Diagnosis not present

## 2019-01-17 DIAGNOSIS — H259 Unspecified age-related cataract: Secondary | ICD-10-CM | POA: Diagnosis not present

## 2019-01-17 DIAGNOSIS — Z79899 Other long term (current) drug therapy: Secondary | ICD-10-CM | POA: Diagnosis not present

## 2019-01-17 DIAGNOSIS — E7800 Pure hypercholesterolemia, unspecified: Secondary | ICD-10-CM | POA: Diagnosis not present

## 2019-01-17 DIAGNOSIS — H2512 Age-related nuclear cataract, left eye: Secondary | ICD-10-CM | POA: Diagnosis not present

## 2019-01-17 HISTORY — PX: CATARACT EXTRACTION W/PHACO: SHX586

## 2019-01-17 LAB — GLUCOSE, CAPILLARY: Glucose-Capillary: 133 mg/dL — ABNORMAL HIGH (ref 70–99)

## 2019-01-17 SURGERY — PHACOEMULSIFICATION, CATARACT, WITH IOL INSERTION
Anesthesia: Monitor Anesthesia Care | Site: Eye | Laterality: Left

## 2019-01-17 MED ORDER — HYDROCODONE-ACETAMINOPHEN 7.5-325 MG PO TABS: 1.0000 | ORAL_TABLET | Freq: Once | ORAL | Status: DC | PRN

## 2019-01-17 MED ORDER — EPINEPHRINE PF 1 MG/ML IJ SOLN
INTRAOCULAR | Status: DC | PRN
  Administered 2019-01-17: 500 mL

## 2019-01-17 MED ORDER — SODIUM HYALURONATE 23 MG/ML IO SOLN
INTRAOCULAR | Status: DC | PRN
  Administered 2019-01-17: 0.6 mL via INTRAOCULAR

## 2019-01-17 MED ORDER — MIDAZOLAM HCL 2 MG/2ML IJ SOLN
INTRAMUSCULAR | Status: AC
  Filled 2019-01-17: qty 2

## 2019-01-17 MED ORDER — LIDOCAINE HCL (PF) 1 % IJ SOLN
INTRAOCULAR | Status: DC | PRN
  Administered 2019-01-17: 1 mL via OPHTHALMIC

## 2019-01-17 MED ORDER — PHENYLEPHRINE HCL 2.5 % OP SOLN
1.0000 [drp] | OPHTHALMIC | Status: AC
  Administered 2019-01-17 (×3): 1 [drp] via OPHTHALMIC

## 2019-01-17 MED ORDER — PROMETHAZINE HCL 25 MG/ML IJ SOLN: 6.2500 mg | INTRAMUSCULAR | Status: DC | PRN

## 2019-01-17 MED ORDER — POVIDONE-IODINE 5 % OP SOLN
OPHTHALMIC | Status: DC | PRN
  Administered 2019-01-17: 1 via OPHTHALMIC

## 2019-01-17 MED ORDER — TETRACAINE HCL 0.5 % OP SOLN
1.0000 [drp] | OPHTHALMIC | Status: AC
  Administered 2019-01-17 (×3): 1 [drp] via OPHTHALMIC

## 2019-01-17 MED ORDER — MIDAZOLAM HCL 2 MG/2ML IJ SOLN
INTRAMUSCULAR | Status: DC | PRN
  Administered 2019-01-17: 1 mg via INTRAVENOUS

## 2019-01-17 MED ORDER — CYCLOPENTOLATE-PHENYLEPHRINE 0.2-1 % OP SOLN
1.0000 [drp] | OPHTHALMIC | Status: AC
  Administered 2019-01-17 (×3): 1 [drp] via OPHTHALMIC

## 2019-01-17 MED ORDER — HYDROMORPHONE HCL 1 MG/ML IJ SOLN: 0.2500 mg | INTRAMUSCULAR | Status: DC | PRN

## 2019-01-17 MED ORDER — LIDOCAINE HCL 3.5 % OP GEL: 1.0000 "application " | Freq: Once | OPHTHALMIC | Status: DC

## 2019-01-17 MED ORDER — PROVISC 10 MG/ML IO SOLN
INTRAOCULAR | Status: DC | PRN
  Administered 2019-01-17: 0.85 mL via INTRAOCULAR

## 2019-01-17 MED ORDER — MIDAZOLAM HCL 2 MG/2ML IJ SOLN: 0.5000 mg | Freq: Once | INTRAMUSCULAR | Status: DC | PRN

## 2019-01-17 MED ORDER — NEOMYCIN-POLYMYXIN-DEXAMETH 3.5-10000-0.1 OP SUSP
OPHTHALMIC | Status: DC | PRN
  Administered 2019-01-17: 2 [drp] via OPHTHALMIC

## 2019-01-17 MED ORDER — BSS IO SOLN
INTRAOCULAR | Status: DC | PRN
  Administered 2019-01-17: 15 mL

## 2019-01-17 SURGICAL SUPPLY — 15 items
CLOTH BEACON ORANGE TIMEOUT ST (SAFETY) ×1 IMPLANT
GLOVE BIOGEL PI IND STRL 6.5 (GLOVE) IMPLANT
GLOVE BIOGEL PI IND STRL 7.0 (GLOVE) IMPLANT
GLOVE BIOGEL PI INDICATOR 6.5 (GLOVE) ×1
GLOVE BIOGEL PI INDICATOR 7.0 (GLOVE) ×1
LENS ALC ACRYL/TECN (Ophthalmic Related) ×1 IMPLANT
NDL HYPO 18GX1.5 BLUNT FILL (NEEDLE) IMPLANT
NEEDLE HYPO 18GX1.5 BLUNT FILL (NEEDLE) ×2 IMPLANT
PAD ARMBOARD 7.5X6 YLW CONV (MISCELLANEOUS) ×1 IMPLANT
SHIELD EYE LENSE ONLY DISP (GAUZE/BANDAGES/DRESSINGS) ×1 IMPLANT
SYR TB 1ML LL NO SAFETY (SYRINGE) ×1 IMPLANT
TAPE SURG TRANSPORE 1 IN (GAUZE/BANDAGES/DRESSINGS) IMPLANT
TAPE SURGICAL TRANSPORE 1 IN (GAUZE/BANDAGES/DRESSINGS) ×1
VISCOELASTIC ADDITIONAL (OPHTHALMIC RELATED) ×1 IMPLANT
WATER STERILE IRR 250ML POUR (IV SOLUTION) ×1 IMPLANT

## 2019-01-17 NOTE — H&P (Signed)
The H and P was reviewed and updated. The patient was examined.  No changes were found after exam.  The surgical eye was marked.  

## 2019-01-17 NOTE — Anesthesia Postprocedure Evaluation (Signed)
Anesthesia Post Note  Patient: Carolyn Hunter  Procedure(s) Performed: CATARACT EXTRACTION PHACO AND INTRAOCULAR LENS PLACEMENT ([place]) (Left Eye)  Patient location during evaluation: PACU Anesthesia Type: MAC Level of consciousness: awake Pain management: pain level controlled Vital Signs Assessment: post-procedure vital signs reviewed and stable Respiratory status: spontaneous breathing Cardiovascular status: stable Postop Assessment: no apparent nausea or vomiting and adequate PO intake Anesthetic complications: no     Last Vitals:  Vitals:   01/17/19 0921  BP: (!) 178/81  Resp: 18  Temp: 36.7 C  SpO2: 98%    Last Pain:  Vitals:   01/17/19 0921  TempSrc: Oral  PainSc: 0-No pain                 Everette Rank

## 2019-01-17 NOTE — Transfer of Care (Signed)
Immediate Anesthesia Transfer of Care Note  Patient: Carolyn Hunter  Procedure(s) Performed: CATARACT EXTRACTION PHACO AND INTRAOCULAR LENS PLACEMENT (IOC) (Left Eye)  Patient Location: PACU  Anesthesia Type:MAC  Level of Consciousness: awake  Airway & Oxygen Therapy: Patient Spontanous Breathing  Post-op Assessment: Report given to RN and Post -op Vital signs reviewed and stable  Post vital signs: Reviewed and stable  Last Vitals:  Vitals Value Taken Time  BP    Temp    Pulse    Resp    SpO2      Last Pain:  Vitals:   01/17/19 0921  TempSrc: Oral  PainSc: 0-No pain      Patients Stated Pain Goal: 8 (08/03/81 5003)  Complications: No apparent anesthesia complications

## 2019-01-17 NOTE — Op Note (Signed)
Date of procedure: 01/17/19  Pre-operative diagnosis: Visually significant age-related cataract, Left Eye (H25.812)  Post-operative diagnosis: Visually significant age-related cataract, Left Eye  Procedure: Removal of cataract via phacoemulsification and insertion of intra-ocular lens [employer] PCB00  +20.5D into the capsular bag of the Left Eye  Attending surgeon: Gerda Diss. Jiyan Walkowski, MD, MA  Anesthesia: MAC, Topical [place]  Complications: None  Estimated Blood Loss: <24m (minimal)  Specimens: None  Implants: As above  Indications:  Visually significant age-related cataract, Left Eye  Procedure:  The patient was seen and identified in the pre-operative area. The operative eye was identified and dilated.  The operative eye was marked.  Topical anesthesia was administered to the operative eye.     The patient was then to the operative suite and placed in the supine position.  A timeout was performed confirming the patient, procedure to be performed, and all other relevant information.   The patient's face was prepped and draped in the usual fashion for intra-ocular surgery.  A lid speculum was placed into the operative eye and the surgical microscope moved into place and focused.  An inferotemporal paracentesis was created using a 20 gauge paracentesis blade.  Shugarcaine was injected into the anterior chamber.  Viscoelastic was injected into the anterior chamber.  A temporal clear-corneal main wound incision was created using a 2.480mmicrokeratome.  A continuous curvilinear capsulorrhexis was initiated using an irrigating cystitome and completed using capsulorrhexis forceps.  Hydrodissection and hydrodeliniation were performed.  Viscoelastic was injected into the anterior chamber.  A phacoemulsification handpiece and a chopper as a second instrument were used to remove the nucleus and epinucleus. The irrigation/aspiration handpiece was used to remove any remaining cortical  material.   The capsular bag was reinflated with viscoelastic, checked, and found to be intact.  The intraocular lens was inserted into the capsular bag and dialed into place using a Kuglen hook.  The irrigation/aspiration handpiece was used to remove any remaining viscoelastic.  The clear corneal wound and paracentesis wounds were then hydrated and checked with Weck-Cels to be watertight.  The lid-speculum and drape was removed, and the patient's face was cleaned with a wet and dry 4x4.  Maxitrol was instilled in the eye before a clear shield was taped over the eye. The patient was taken to the post-operative care unit in good condition, having tolerated the procedure well.  Post-Op Instructions: [facility] and will receive all other orders and instructions.

## 2019-01-17 NOTE — Discharge Instructions (Addendum)
Please discharge patient when stable, will follow up today with Dr. Wrzosek at the [facility] office immediately following discharge.  Leave shield in place until visit.  All paperwork with discharge instructions will be given at the office. ° ° ° ° ° °Monitored Anesthesia Care, Care After °These instructions provide you with information about caring for yourself after your procedure. Your health care provider may also give you more specific instructions. Your treatment has been planned according to current medical practices, but problems sometimes occur. Call your health care provider if you have any problems or questions after your procedure. °What can I expect after the procedure? °After your procedure, you may: °· Feel sleepy for several hours. °· Feel clumsy and have poor balance for several hours. °· Feel forgetful about what happened after the procedure. °· Have poor judgment for several hours. °· Feel nauseous or vomit. °· Have a sore throat if you had a breathing tube during the procedure. °Follow these instructions at home: °For at least 24 hours after the procedure: ° °  ° °· Have a responsible adult stay with you. It is important to have someone help care for you until you are awake and alert. °· Rest as needed. °· Do not: °? Participate in activities in which you could fall or become injured. °? Drive. °? Use heavy machinery. °? Drink alcohol. °? Take sleeping pills or medicines that cause drowsiness. °? Make important decisions or sign legal documents. °? Take care of children on your own. °Eating and drinking °· Follow the diet that is recommended by your health care provider. °· If you vomit, drink water, juice, or soup when you can drink without vomiting. °· Make sure you have little or no nausea before eating solid foods. °General instructions °· Take over-the-counter and prescription medicines only as told by your health care provider. °· If you have sleep apnea, surgery and certain  medicines can increase your risk for breathing problems. Follow instructions from your health care provider about wearing your sleep device: °? Anytime you are sleeping, including during daytime naps. °? While taking prescription pain medicines, sleeping medicines, or medicines that make you drowsy. °· If you smoke, do not smoke without supervision. °· Keep all follow-up visits as told by your health care provider. This is important. °Contact a health care provider if: °· You keep feeling nauseous or you keep vomiting. °· You feel light-headed. °· You develop a rash. °· You have a fever. °Get help right away if: °· You have trouble breathing. °Summary °· For several hours after your procedure, you may feel sleepy and have poor judgment. °· Have a responsible adult stay with you for at least 24 hours or until you are awake and alert. °This information is not intended to replace advice given to you by your health care provider. Make sure you discuss any questions you have with your health care provider. °Document Released: 11/18/2015 Document Revised: 03/13/2017 Document Reviewed: 11/18/2015 °Elsevier Interactive Patient Education © 2019 [employer] ° °

## 2019-01-17 NOTE — Anesthesia Preprocedure Evaluation (Signed)
Anesthesia Evaluation  Patient identified by MRN, date of birth, ID band Patient awake    Reviewed: Allergy & Precautions, NPO status , Patient's Chart, lab work & pertinent test results  Airway Mallampati: I  TM Distance: >3 FB Neck ROM: Full    Dental no notable dental hx.    Pulmonary neg pulmonary ROS,    Pulmonary exam normal breath sounds clear to auscultation       Cardiovascular Exercise Tolerance: Good hypertension, Pt. on medications negative cardio ROS Normal cardiovascular examI Rhythm:Regular Rate:Normal     Neuro/Psych negative neurological ROS  negative psych ROS   GI/Hepatic negative GI ROS, Neg liver ROS,   Endo/Other  negative endocrine ROSdiabetes, Well Controlled, Type 2, Oral Hypoglycemic Agents  Renal/GU negative Renal ROS  negative genitourinary   Musculoskeletal negative musculoskeletal ROS (+)   Abdominal   Peds negative pediatric ROS (+)  Hematology negative hematology ROS (+)   Anesthesia Other Findings On Zocor for increased cholesterol  Reproductive/Obstetrics negative OB ROS                             Anesthesia Physical Anesthesia Plan  ASA: II  Anesthesia Plan: MAC   Post-op Pain Management:    Induction: Intravenous  PONV Risk Score and Plan:   Airway Management Planned: Simple Face Mask and Nasal Cannula  Additional Equipment:   Intra-op Plan:   Post-operative Plan:   Informed Consent: I have reviewed the patients History and Physical, chart, labs and discussed the procedure including the risks, benefits and alternatives for the proposed anesthesia with the patient or authorized representative who has indicated his/her understanding and acceptance.     Dental advisory given  Plan Discussed with: CRNA  Anesthesia Plan Comments: (Plan MAC  Plan full PPE use  Pt with known tremor -not a problem last time)        Anesthesia  Quick Evaluation

## 2019-01-18 ENCOUNTER — Encounter (HOSPITAL_COMMUNITY): Payer: Self-pay | Admitting: Ophthalmology

## 2019-02-15 NOTE — Progress Notes (Signed)
[facility] - Clinic Note  02/16/2019     CHIEF COMPLAINT Patient presents for Retina Follow Up   HISTORY OF PRESENT ILLNESS: Carolyn Hunter is a 68 y.o. female who presents to the clinic today for:   HPI    Retina Follow Up    Patient presents with  Diabetic Retinopathy.  In both eyes.  This started 6 weeks ago.  Severity is moderate.  I, the attending physician,  performed the HPI with the patient and updated documentation appropriately.          Comments    Patient here for 6 weeks for retina follow up for NPDR OU. Patient states vision doing good. OD had a little eye pain over the weekend. No today.       Last edited by Rennis ChrisZamora, Prarthana Parlin, MD on 02/16/2019  3:16 PM. (History)    pt had cataract sx on her left eye on 06.08.20, she states she is off all post-op drops, she states she felt like her right eye vision was declining slightly over the weekend, she states she had some eye pain and redness as well   Referring physician: Kirstie PeriShah, Ashish, MD [address] [place],  [place] [id number]  HISTORICAL INFORzMATION:   Selected notes from the MEDICAL RECORD NUMBER Referral from Dr. Fabienne BrunsM. Cotter for DM exam;  Ocular Hx-  PMH- DM; high chol; HTN; Last A1C- 7.1 (9.7.18);    CURRENT MEDICATIONS: No current outpatient medications on file. (Ophthalmic Drugs)   No current facility-administered medications for this visit.  (Ophthalmic Drugs)   Current Outpatient Medications (Other)  Medication Sig  . aspirin EC 81 MG tablet Take 1 tablet (81 mg total) by mouth daily. (Patient taking differently: Take 81 mg by mouth at bedtime. )  . Liniments (SALONPAS PAIN RELIEF PATCH EX) Place 1 patch onto the skin daily as needed (pain.).  Marland Kitchen. lisinopril (PRINIVIL,ZESTRIL) 10 MG tablet Take 1 tablet (10 mg total) by mouth daily. (Patient taking differently: Take 10 mg by mouth at bedtime. )  . metFORMIN (GLUCOPHAGE) 500 MG tablet Take 1 tablet (500 mg total) by mouth daily with breakfast.  .  simvastatin (ZOCOR) 40 MG tablet Take 1 tablet (40 mg total) by mouth at bedtime.   No current facility-administered medications for this visit.  (Other)      REVIEW OF SYSTEMS: ROS    Positive for: Musculoskeletal, Endocrine, Cardiovascular, Eyes   Negative for: Constitutional, Gastrointestinal, Neurological, Skin, Genitourinary, HENT, Respiratory, Psychiatric, Allergic/Imm, Heme/Lymph   Last edited by Rennis ChrisZamora, Daniqua Campoy, MD on 02/16/2019  3:16 PM. (History)       ALLERGIES No Known Allergies  PAST MEDICAL HISTORY Past Medical History:  Diagnosis Date  . Cataract    OS  . Diabetes mellitus without complication (HCC)   . Diabetic retinopathy (HCC)    NPDR OU  . Hyperlipidemia   . Hypertension   . Hypertensive retinopathy    OU   Past Surgical History:  Procedure Laterality Date  . CATARACT EXTRACTION W/PHACO Right 10/18/2018   Procedure: CATARACT EXTRACTION PHACO AND INTRAOCULAR LENS PLACEMENT (IOC);  Surgeon: Fabio PierceWrzosek, James, MD;  Location: [facility];  Service: Ophthalmology;  Laterality: Right;  CDE: 8.11  . CATARACT EXTRACTION W/PHACO Left 01/17/2019   Procedure: CATARACT EXTRACTION PHACO AND INTRAOCULAR LENS PLACEMENT (IOC);  Surgeon: Fabio PierceWrzosek, James, MD;  Location: [facility];  Service: Ophthalmology;  Laterality: Left;  CDE: 6.38  . CESAREAN SECTION     1988  . EYE SURGERY Right  Cataract extraction OD    FAMILY HISTORY Family History  Problem Relation Age of Onset  . Cancer Mother        breast and colon cancer  . Diabetes Brother   . Diabetes Son   . Asthma Father        COPD  . Diabetes Son     SOCIAL HISTORY Social History   Tobacco Use  . Smoking status: Never Smoker  . Smokeless tobacco: Never Used  Substance Use Topics  . Alcohol use: No  . Drug use: No         OPHTHALMIC EXAM:  Base Eye Exam    Visual Acuity (Snellen - Linear)      Right Left   Dist [place] 20/30 +1 20/20 -1   Dist ph [place] NI        Tonometry (Tonopen, 2:11 PM)      Right Left    Pressure 15 16       Pupils      Dark Light Shape React APD   Right 3 2 Round Brisk None   Left 3 2 Round Brisk None       Visual Fields (Counting fingers)      Left Right    Full Full       Extraocular Movement      Right Left    Full, Ortho Full, Ortho       Neuro/Psych    Oriented x3: Yes   Mood/Affect: Normal       Dilation    Both eyes: 1.0% Mydriacyl, 2.5% Phenylephrine @ 2:10 PM        Slit Lamp and Fundus Exam    Slit Lamp Exam      Right Left   Lids/Lashes Dermatochalasis - upper lid, Meibomian gland dysfunction Dermatochalasis - upper lid, Meibomian gland dysfunction   Conjunctiva/Sclera White and quiet White and quiet   Cornea Mild Arcus, Well healed temporal cataract wounds Mild Arcus, EBMD, trace Punctate epithelial erosions, Well healed temporal cataract wounds   Anterior Chamber Deep, 1+cell/pigment Deep, 0.5+cell/pigment   Iris Round and dilated, No NVI, PPM nasally Round and dilated, No NVI   Lens PC IOL in good position PC IOL in good position   Vitreous Vitreous syneresis, residual kenalog inferiorly Vitreous syneresis       Fundus Exam      Right Left   Disc Pink and Sharp Pink and Sharp   C/D Ratio 0.3 0.3   Macula blunted foveal reflex, interval increase in central cystic changes/edema, scattered MA blunted foveal reflex, moderate IRH temporal to fovea with surrounding circunate exudate   Vessels Vascular attenuation, Tortuous Vascular attenuation, Tortuous   Periphery Attached, scattered MAs, DBH, exudates inferior to disc - improving attached, scattered IRH/focal exudates          IMAGING AND PROCEDURES  Imaging and Procedures for 12/10/17  OCT, Retina - OU - Both Eyes       Right Eye Quality was good. Central Foveal Thickness: 508. Progression has worsened. Findings include abnormal foveal contour, intraretinal hyper-reflective material, no SRF, intraretinal fluid, vitreomacular adhesion  (Interval increase in IRF/IRHM).   Left  Eye Quality was good. Central Foveal Thickness: 378. Progression has worsened. Findings include abnormal foveal contour, intraretinal fluid, intraretinal hyper-reflective material, no SRF, vitreomacular adhesion , epiretinal membrane (Interval increase in IRF temporal macula).   Notes Images taken, stored on drive  Diagnosis / Impression:  DME OU (OD>OS) OD: Interval increase in IRF/IRHM OS: mild  interval increase in IRF temporal macula   Clinical management:  See below  Abbreviations: NFP - Normal foveal profile. CME - cystoid macular edema. PED - pigment epithelial detachment. IRF - intraretinal fluid. SRF - subretinal fluid. EZ - ellipsoid zone. ERM - epiretinal membrane. ORA - outer retinal atrophy. ORT - outer retinal tubulation. SRHM - subretinal hyper-reflective material         Intravitreal Injection, Pharmacologic Agent - OD - Right Eye       Time Out 02/16/2019. 3:18 PM. Confirmed correct patient, procedure, site, and patient consented.   Anesthesia Topical anesthesia was used. Anesthetic medications included Lidocaine 2%, Proparacaine 0.5%.   Procedure Preparation included 5% betadine to ocular surface, eyelid speculum. A supplied needle was used.   Injection:  1.25 mg Bevacizumab (AVASTIN) SOLN   NDC: 17001-749-44, Lot: [id number] , Expiration date: 04/20/2019   Route: Intravitreal, Site: Right Eye, Waste: 0 mL  Post-op Post injection exam found visual acuity of at least counting fingers. The patient tolerated the procedure well. There were no complications. The patient received written and verbal post procedure care education.                 ASSESSMENT/PLAN:    ICD-10-CM   1. 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 Hypertensive retinopathy of both eyes   H35.033   3. Combined forms of age-related cataract of left eye  H25.812   4. Pseudophakia of right eye  Z96.1   5. Retinal edema  H35.81 OCT, Retina - OU - Both Eyes  6. Combined forms of age-related cataract of both eyes  H25.813     1. Moderate nonproliferative diabetic retinopathy, both eyes  - Diabetic macular edema, both eyes -- relatively mild  - S/P IVA OD #1 (12.17.18), #2 (01.28.19), #3 (02.27.19), #4 (04.03.19), #5 (05.01.19), #6 (05.27.20)  - in reviewing her case, there was minimal response to IVA in OCT or BCVA  - S/P IVTA OD #1 (05.29.19), #2 (07.24.19), #3 (09.04.19), #4 (12.04.19), #5 (02.26.20)  - slightly better response with IVTA  - today, OCT shows interval increase in IRF/IRHM OD and interval increase in IRF temporal macula OS  - BCVA 20/30 OD and 20/20 OS  - IOP 15 mmHg OD  - recommend IVA OD #7 today, 07.08.20  - pt wishes to proceed  - RBA of procedure discussed, questions answered  - informed consent obtained and signed  - see procedure note  - restart combo NSAID/steroid left over from cataract sx -- QID OD  - f/u 4 weeks -- DFE/OCT possible IVTA if no improvement from IVA  2,3. Hypertensive retinopathy OU  - discussed importance of tight BP control  - monitor  4. Pseudophakia OU  - s/p CE/IOL OU (Dr. 20.08.20, [place])  - beautiful surgeries, doing well  - monitor  5. Retinal edema as above   Ophthalmic Meds Ordered this visit:  Meds ordered this encounter  Medications  . Bevacizumab (AVASTIN) SOLN 1.25 mg       Return in about 4 weeks (around 03/16/2019) for f/u NPDR OU, DFE, OCT.  There are no Patient Instructions on file for this visit.   Explained the diagnoses, plan, and follow up with the patient and they expressed understanding.  Patient expressed understanding of the importance of proper follow up care.  This document serves as a record of services personally performed by Karie ChimeraBrian G. Deshawn Witty, MD, PhD. It was created on their  behalf by Laurian BrimAmanda Brown, OA, an ophthalmic assistant. The creation of this record is the provider's dictation and/or activities during the visit.    Electronically signed by: Laurian BrimAmanda Brown, OA  07.07.2020 4:49 PM   Karie ChimeraBrian G. Erika Slaby, M.D., Ph.D. Diseases & Surgery of the Retina and Vitreous [facility]  I have reviewed the above documentation for accuracy and completeness, and I agree with the above. Karie ChimeraBrian G. Francisco Ostrovsky, M.D., Ph.D. 02/16/19 4:56 PM    Abbreviations: M myopia (nearsighted); A astigmatism; H hyperopia (farsighted); P presbyopia; Mrx spectacle prescription;  CTL contact lenses; OD right eye; OS left eye; OU both eyes  XT exotropia; ET esotropia; PEK punctate epithelial keratitis; PEE punctate epithelial erosions; DES dry eye syndrome; MGD meibomian gland dysfunction; ATs artificial tears; PFAT's preservative free artificial tears; NSC nuclear sclerotic cataract; PSC posterior subcapsular cataract; ERM epi-retinal membrane; PVD posterior vitreous detachment; RD retinal detachment; DM diabetes mellitus; DR diabetic retinopathy; NPDR non-proliferative diabetic retinopathy; PDR proliferative diabetic retinopathy; CSME clinically significant macular edema; DME diabetic macular edema; dbh dot blot hemorrhages; CWS cotton wool spot; POAG primary open angle glaucoma; C/D cup-to-disc ratio; HVF humphrey visual field; GVF goldmann visual field; OCT optical coherence tomography; IOP intraocular pressure; BRVO Branch retinal vein occlusion; CRVO central retinal vein occlusion; CRAO central retinal artery occlusion; BRAO branch retinal artery occlusion; RT retinal tear; SB scleral buckle; PPV pars plana vitrectomy; VH Vitreous hemorrhage; PRP panretinal laser photocoagulation; IVK intravitreal kenalog; VMT vitreomacular traction; MH Macular hole;  NVD neovascularization of the disc; NVE neovascularization elsewhere; AREDS age related eye disease study; ARMD age related macular  degeneration; POAG primary open angle glaucoma; EBMD epithelial/anterior basement membrane dystrophy; ACIOL anterior chamber intraocular lens; IOL intraocular lens; PCIOL posterior chamber intraocular lens; Phaco/IOL phacoemulsification with intraocular lens placement; PRK photorefractive keratectomy; LASIK laser assisted in situ keratomileusis; HTN hypertension; DM diabetes mellitus; COPD chronic obstructive pulmonary disease

## 2019-02-16 ENCOUNTER — Encounter (INDEPENDENT_AMBULATORY_CARE_PROVIDER_SITE_OTHER): Payer: Self-pay | Admitting: Ophthalmology

## 2019-02-16 ENCOUNTER — Other Ambulatory Visit: Payer: Self-pay

## 2019-02-16 ENCOUNTER — Ambulatory Visit (INDEPENDENT_AMBULATORY_CARE_PROVIDER_SITE_OTHER): Payer: PPO | Admitting: Ophthalmology

## 2019-02-16 DIAGNOSIS — E113313 Type 2 diabetes mellitus with moderate nonproliferative diabetic retinopathy with macular edema, bilateral: Secondary | ICD-10-CM | POA: Diagnosis not present

## 2019-02-16 DIAGNOSIS — Z961 Presence of intraocular lens: Secondary | ICD-10-CM | POA: Diagnosis not present

## 2019-02-16 DIAGNOSIS — H35033 Hypertensive retinopathy, bilateral: Secondary | ICD-10-CM | POA: Diagnosis not present

## 2019-02-16 DIAGNOSIS — H3581 Retinal edema: Secondary | ICD-10-CM | POA: Diagnosis not present

## 2019-02-16 DIAGNOSIS — I10 Essential (primary) hypertension: Secondary | ICD-10-CM

## 2019-02-16 MED ORDER — BEVACIZUMAB CHEMO INJECTION 1.25MG/0.05ML SYRINGE FOR KALEIDOSCOPE
1.2500 mg | INTRAVITREAL | Status: AC | PRN
  Administered 2019-02-16: 1.25 mg via INTRAVITREAL

## 2019-03-15 NOTE — Progress Notes (Signed)
[facility] - Clinic Note  03/16/2019     CHIEF COMPLAINT Patient presents for Retina Follow Up   HISTORY OF PRESENT ILLNESS: Carolyn Hunter is a 68 y.o. female who presents to the clinic today for:   HPI    Retina Follow Up    Patient presents with  Diabetic Retinopathy.  In both eyes.  This started 5 months ago.  Severity is mild.  I, the attending physician,  performed the HPI with the patient and updated documentation appropriately.          Comments    F/U NPDR OU. Patient states her vision has "gotten better", denies new visual issues/onsets. Bs 120 this am, A1C 6.2 (2 mos ago), labs are scheduled this month per patient.       Last edited by Rennis ChrisZamora, Valbona Slabach, MD on 03/16/2019  1:47 PM. (History)    pt she is happy with her vision, she states she is not using any drops in her left eye, but is still using the NSAID/steroid drop in her right eye 4 times a day, she states she feel like the injection helped her last time, but she thinks the steroid helps even more  Referring physician: Kirstie PeriShah, Ashish, MD [address] [place],  [place] [id number]  HISTORICAL INFORzMATION:   Selected notes from the MEDICAL RECORD NUMBER Referral from Dr. Fabienne BrunsM. Cotter for DM exam;  Ocular Hx-  PMH- DM; high chol; HTN; Last A1C- 7.1 (9.7.18);    CURRENT MEDICATIONS: Current Outpatient Medications (Ophthalmic Drugs)  Medication Sig  . prednisoLONE acetate (PRED FORTE) 1 % ophthalmic suspension Place 1 drop into both eyes 4 (four) times daily.   No current facility-administered medications for this visit.  (Ophthalmic Drugs)   Current Outpatient Medications (Other)  Medication Sig  . aspirin EC 81 MG tablet Take 1 tablet (81 mg total) by mouth daily. (Patient taking differently: Take 81 mg by mouth at bedtime. )  . Liniments (SALONPAS PAIN RELIEF PATCH EX) Place 1 patch onto the skin daily as needed (pain.).  Marland Kitchen. lisinopril (PRINIVIL,ZESTRIL) 10 MG tablet Take 1 tablet (10 mg total) by mouth  daily. (Patient taking differently: Take 10 mg by mouth at bedtime. )  . metFORMIN (GLUCOPHAGE) 500 MG tablet Take 1 tablet (500 mg total) by mouth daily with breakfast.  . simvastatin (ZOCOR) 40 MG tablet Take 1 tablet (40 mg total) by mouth at bedtime.   No current facility-administered medications for this visit.  (Other)      REVIEW OF SYSTEMS: ROS    Positive for: Endocrine, Eyes   Negative for: Constitutional, Gastrointestinal, Neurological, Skin, Genitourinary, Musculoskeletal, HENT, Cardiovascular, Respiratory, Psychiatric, Allergic/Imm, Heme/Lymph   Last edited by Eldridge ScotKendrick, Glenda, LPN on 6/0/45408/12/2018  1:22 PM. (History)       ALLERGIES No Known Allergies  PAST MEDICAL HISTORY Past Medical History:  Diagnosis Date  . Cataract    OS  . Diabetes mellitus without complication (HCC)   . Diabetic retinopathy (HCC)    NPDR OU  . Hyperlipidemia   . Hypertension   . Hypertensive retinopathy    OU   Past Surgical History:  Procedure Laterality Date  . CATARACT EXTRACTION W/PHACO Right 10/18/2018   Procedure: CATARACT EXTRACTION PHACO AND INTRAOCULAR LENS PLACEMENT (IOC);  Surgeon: Fabio PierceWrzosek, James, MD;  Location: [facility];  Service: Ophthalmology;  Laterality: Right;  CDE: 8.11  . CATARACT EXTRACTION W/PHACO Left 01/17/2019   Procedure: CATARACT EXTRACTION PHACO AND INTRAOCULAR LENS PLACEMENT (IOC);  Surgeon:  Fabio PierceWrzosek, James, MD;  Location: [facility];  Service: Ophthalmology;  Laterality: Left;  CDE: 6.38  . CESAREAN SECTION     1988  . EYE SURGERY Right    Cataract extraction OD    FAMILY HISTORY Family History  Problem Relation Age of Onset  . Cancer Mother        breast and colon cancer  . Diabetes Brother   . Diabetes Son   . Asthma Father        COPD  . Diabetes Son     SOCIAL HISTORY Social History   Tobacco Use  . Smoking status: Never Smoker  . Smokeless tobacco: Never Used  Substance Use Topics  . Alcohol use: No  . Drug use: No         OPHTHALMIC  EXAM:  Base Eye Exam    Visual Acuity (Snellen - Linear)      Right Left   Dist [place] 20/30 +1 20/25 +1   Dist ph [place] NI NI       Tonometry (Tonopen, 1:29 PM)      Right Left   Pressure 10 12       Pupils      Dark Light Shape React APD   Right 3 2 Round Brisk None   Left 3 2 Round Brisk None       Visual Fields (Counting fingers)      Left Right    Full Full       Extraocular Movement      Right Left    Full, Ortho Full, Ortho       Neuro/Psych    Oriented x3: Yes   Mood/Affect: Normal       Dilation    Both eyes: 1.0% Mydriacyl, 2.5% Phenylephrine @ 1:24 PM        Slit Lamp and Fundus Exam    Slit Lamp Exam      Right Left   Lids/Lashes Dermatochalasis - upper lid, Meibomian gland dysfunction Dermatochalasis - upper lid, Meibomian gland dysfunction   Conjunctiva/Sclera White and quiet White and quiet   Cornea Mild Arcus, Well healed temporal cataract wounds, trace endo pigment inferiorly Mild Arcus, EBMD, trace Punctate epithelial erosions, Well healed temporal cataract wounds   Anterior Chamber Deep, 0.5+pigment Deep, 0.5+pigment   Iris Round and dilated, No NVI, PPM nasally Round and dilated, No NVI   Lens PC IOL in good position PC IOL in good position   Vitreous Vitreous syneresis, triamsinolone stained vitreous Vitreous syneresis       Fundus Exam      Right Left   Disc Pink and Sharp Pink and Sharp   C/D Ratio 0.3 0.4   Macula blunted foveal reflex, central DME with mild MA and punctate exudate  blunted foveal reflex, Epiretinal membrane, scattered cystic changes, mild MA and exudates temporal macula   Vessels Vascular attenuation, Tortuous, AV crossing changes Vascular attenuation, Tortuous   Periphery Attached, scattered MAs, DBH, exudates inferior to disc - improving attached, scattered IRH/focal exudates greatest posteriorly          IMAGING AND PROCEDURES  Imaging and Procedures for 12/10/17  OCT, Retina - OU - Both Eyes       Right  Eye Quality was good. Central Foveal Thickness: 488. Progression has improved. Findings include abnormal foveal contour, intraretinal hyper-reflective material, intraretinal fluid, vitreomacular adhesion , subretinal fluid (Interval improvement in IRF/IRHM; interval increase in SRF).   Left Eye Quality was good. Central Foveal Thickness: 427.  Progression has worsened. Findings include abnormal foveal contour, intraretinal fluid, intraretinal hyper-reflective material, no SRF, vitreomacular adhesion , epiretinal membrane (Mild Interval increase in IRF ).   Notes Images taken, stored on drive  Diagnosis / Impression:  DME OU (OD>OS) OD: Interval improvement in IRF/IRHM; interval increase in SRF OS: mild interval increase in IRF    Clinical management:  See below  Abbreviations: NFP - Normal foveal profile. CME - cystoid macular edema. PED - pigment epithelial detachment. IRF - intraretinal fluid. SRF - subretinal fluid. EZ - ellipsoid zone. ERM - epiretinal membrane. ORA - outer retinal atrophy. ORT - outer retinal tubulation. SRHM - subretinal hyper-reflective material         Intravitreal Injection, Pharmacologic Agent - OD - Right Eye       Time Out 03/16/2019. 1:47 PM. Confirmed correct patient, procedure, site, and patient consented.   Anesthesia Topical anesthesia was used. Anesthetic medications included Lidocaine 2%, Proparacaine 0.5%.   Procedure Preparation included 5% betadine to ocular surface, eyelid speculum. A 27 gauge needle was used.   Injection:  4 mg Triescence 40mg /mlL injection   NDC: [phone], Lot: [id number], Expiration date: 12/08/2020   Route: Intravitreal, Site: Right Eye, Waste: 0.9 mL  Post-op Post injection exam found visual acuity of at least counting fingers. The patient tolerated the procedure well. There were no complications. The patient received written and verbal post procedure care education.   Notes An AC tap was performed following  injection due to elevated IOP using a 30 gauge needle on a syringe with the plunger removed. The needle was placed at the limbus at 5 oclock and approximately 0.06 cc of aqueous was removed from the anterior chamber. Betadine was applied to the tap area before and after the paracentesis was performed. There were no complications. The patient tolerated the procedure well. The IOP was rechecked and was found to be 7 mmHg by tonopen.                 ASSESSMENT/PLAN:    ICD-10-CM   1. Moderate nonproliferative diabetic retinopathy of both eyes with macular edema associated with type 2 diabetes mellitus (HCC)  [id number] Intravitreal Injection, Pharmacologic Agent - OD - Right Eye    triamcinolone acetonide (TRIESENCE) 40 MG/ML subtenons injection 4 mg  2. Essential hypertension  I10   3. Hypertensive retinopathy of both eyes  H35.033   4. Pseudophakia of both eyes  Z96.1   5. Retinal edema  H35.81 OCT, Retina - OU - Both Eyes  6. Combined forms of age-related cataract of left eye  H25.812   7. Pseudophakia of right eye  Z96.1   8. Combined forms of age-related cataract of both eyes  H25.813     1. Moderate nonproliferative diabetic retinopathy, both eyes  - Diabetic macular edema, both eyes -- relatively mild  - S/P IVA OD #1 (12.17.18), #2 (01.28.19), #3 (02.27.19), #4 (04.03.19), #5 (05.01.19), #6 (05.27.20), #7 (07.08.20)  - in reviewing her case, there was minimal response to IVA in OCT or BCVA  - S/P IVTA OD #1 (05.29.19), #2 (07.24.19), #3 (09.04.19), #4 (12.04.19), #5 (02.26.20)  - slightly better response with IVTA  - today, OCT shows interval improvement in IRF/IRHM OD and interval increase in IRF OS  - BCVA 20/30 OD and 20/25 OS  - IOP 10 mmHg OD  - recommend IVTA OD #6 today, 08.05.20  - pt wishes to proceed  - RBA of procedure discussed, questions answered  - informed consent obtained  and signed  - see procedure note  - start Prolensa BID OU/ Pred Forte QID OU  - f/u 4  weeks -- DFE/OCT possible injection  2,3. Hypertensive retinopathy OU  - discussed importance of tight BP control  - monitor  4. Pseudophakia OU  - s/p CE/IOL OU (Dr. Marisa Hua, [place])  - beautiful surgeries, doing well  - monitor  5. Retinal edema as above   Ophthalmic Meds Ordered this visit:  Meds ordered this encounter  Medications  . prednisoLONE acetate (PRED FORTE) 1 % ophthalmic suspension    Sig: Place 1 drop into both eyes 4 (four) times daily.    Dispense:  15 mL    Refill:  0  . triamcinolone acetonide (TRIESENCE) 40 MG/ML subtenons injection 4 mg       Return in about 4 weeks (around 04/13/2019) for f/u NPDR OU, DFE, OCT.  There are no Patient Instructions on file for this visit.   Explained the diagnoses, plan, and follow up with the patient and they expressed understanding.  Patient expressed understanding of the importance of proper follow up care.   This document serves as a record of services personally performed by Gardiner Sleeper, MD, PhD. It was created on their behalf by Ernest Mallick, OA, an ophthalmic assistant. The creation of this record is the provider's dictation and/or activities during the visit.    Electronically signed by: Ernest Mallick, OA  08.04.2020 2:54 PM    Gardiner Sleeper, M.D., Ph.D. Diseases & Surgery of the Retina and Vitreous [facility]  I have reviewed the above documentation for accuracy and completeness, and I agree with the above. Gardiner Sleeper, M.D., Ph.D. 03/16/19 2:57 PM    Abbreviations: M myopia (nearsighted); A astigmatism; H hyperopia (farsighted); P presbyopia; Mrx spectacle prescription;  CTL contact lenses; OD right eye; OS left eye; OU both eyes  XT exotropia; ET esotropia; PEK punctate epithelial keratitis; PEE punctate epithelial erosions; DES dry eye syndrome; MGD meibomian gland dysfunction; ATs artificial tears; PFAT's preservative free artificial tears; [place] nuclear sclerotic  cataract; PSC posterior subcapsular cataract; ERM epi-retinal membrane; PVD posterior vitreous detachment; RD retinal detachment; DM diabetes mellitus; DR diabetic retinopathy; NPDR non-proliferative diabetic retinopathy; PDR proliferative diabetic retinopathy; CSME clinically significant macular edema; DME diabetic macular edema; dbh dot blot hemorrhages; CWS cotton wool spot; POAG primary open angle glaucoma; C/D cup-to-disc ratio; HVF humphrey visual field; GVF goldmann visual field; OCT optical coherence tomography; IOP intraocular pressure; BRVO Branch retinal vein occlusion; CRVO central retinal vein occlusion; CRAO central retinal artery occlusion; BRAO branch retinal artery occlusion; RT retinal tear; SB scleral buckle; PPV pars plana vitrectomy; VH Vitreous hemorrhage; PRP panretinal laser photocoagulation; IVK intravitreal kenalog; VMT vitreomacular traction; MH Macular hole;  NVD neovascularization of the disc; NVE neovascularization elsewhere; AREDS age related eye disease study; ARMD age related macular degeneration; POAG primary open angle glaucoma; EBMD epithelial/anterior basement membrane dystrophy; ACIOL anterior chamber intraocular lens; IOL intraocular lens; PCIOL posterior chamber intraocular lens; Phaco/IOL phacoemulsification with intraocular lens placement; [place] photorefractive keratectomy; LASIK laser assisted in situ keratomileusis; HTN hypertension; DM diabetes mellitus; COPD chronic obstructive pulmonary disease

## 2019-03-16 ENCOUNTER — Other Ambulatory Visit: Payer: Self-pay

## 2019-03-16 ENCOUNTER — Encounter (INDEPENDENT_AMBULATORY_CARE_PROVIDER_SITE_OTHER): Payer: Self-pay | Admitting: Ophthalmology

## 2019-03-16 ENCOUNTER — Ambulatory Visit (INDEPENDENT_AMBULATORY_CARE_PROVIDER_SITE_OTHER): Payer: PPO | Admitting: Ophthalmology

## 2019-03-16 DIAGNOSIS — H25813 Combined forms of age-related cataract, bilateral: Secondary | ICD-10-CM | POA: Diagnosis not present

## 2019-03-16 DIAGNOSIS — Z961 Presence of intraocular lens: Secondary | ICD-10-CM | POA: Diagnosis not present

## 2019-03-16 DIAGNOSIS — I10 Essential (primary) hypertension: Secondary | ICD-10-CM

## 2019-03-16 DIAGNOSIS — E113313 Type 2 diabetes mellitus with moderate nonproliferative diabetic retinopathy with macular edema, bilateral: Secondary | ICD-10-CM

## 2019-03-16 DIAGNOSIS — H25812 Combined forms of age-related cataract, left eye: Secondary | ICD-10-CM | POA: Diagnosis not present

## 2019-03-16 DIAGNOSIS — H3581 Retinal edema: Secondary | ICD-10-CM | POA: Diagnosis not present

## 2019-03-16 DIAGNOSIS — H35033 Hypertensive retinopathy, bilateral: Secondary | ICD-10-CM | POA: Diagnosis not present

## 2019-03-16 MED ORDER — TRIAMCINOLONE ACETONIDE 40 MG/ML IO SUSP
4.0000 mg | INTRAOCULAR | Status: AC | PRN
  Administered 2019-03-16: 4 mg via INTRAVITREAL

## 2019-03-16 MED ORDER — PREDNISOLONE ACETATE 1 % OP SUSP: 1.0000 [drp] | Freq: Four times a day (QID) | OPHTHALMIC | 0 refills | Status: DC

## 2019-04-04 DIAGNOSIS — I10 Essential (primary) hypertension: Secondary | ICD-10-CM | POA: Diagnosis not present

## 2019-04-04 DIAGNOSIS — E1165 Type 2 diabetes mellitus with hyperglycemia: Secondary | ICD-10-CM | POA: Diagnosis not present

## 2019-04-04 DIAGNOSIS — Z299 Encounter for prophylactic measures, unspecified: Secondary | ICD-10-CM | POA: Diagnosis not present

## 2019-04-04 DIAGNOSIS — Z6826 Body mass index (BMI) 26.0-26.9, adult: Secondary | ICD-10-CM | POA: Diagnosis not present

## 2019-04-04 DIAGNOSIS — E7800 Pure hypercholesterolemia, unspecified: Secondary | ICD-10-CM | POA: Diagnosis not present

## 2019-04-11 NOTE — Progress Notes (Signed)
[facility] - Clinic Note  04/13/2019     CHIEF COMPLAINT Patient presents for Retina Follow Up   HISTORY OF PRESENT ILLNESS: Carolyn Hunter is a 68 y.o. female who presents to the clinic today for:   HPI    Retina Follow Up    Patient presents with  Diabetic Retinopathy.  In both eyes.  This started weeks ago.  Severity is moderate.  Duration of weeks.  Since onset it is stable.  I, the attending physician,  performed the HPI with the patient and updated documentation appropriately.          Comments    Patient states her vision is stable.  She denies eye pain or discomfort and denies any new or worsening floaters or fol OU.       Last edited by Rennis Chris, MD on 04/13/2019  2:34 PM. (History)    pt states her vision is doing well  Referring physician: Kirstie Peri, MD [address] [place],  [place] [address]  HISTORICAL INFORzMATION:   Selected notes from the MEDICAL RECORD NUMBER Referral from Dr. Fabienne Bruns for DM exam;  Ocular Hx-  PMH- DM; high chol; HTN; Last A1C- 7.1 (9.7.18);    CURRENT MEDICATIONS: Current Outpatient Medications (Ophthalmic Drugs)  Medication Sig  . Bromfenac Sodium (PROLENSA) 0.07 % SOLN Place 1 drop into both eyes 2 (two) times daily.  . prednisoLONE acetate (PRED FORTE) 1 % ophthalmic suspension Place 1 drop into both eyes 4 (four) times daily.   No current facility-administered medications for this visit.  (Ophthalmic Drugs)   Current Outpatient Medications (Other)  Medication Sig  . aspirin EC 81 MG tablet Take 1 tablet (81 mg total) by mouth daily. (Patient taking differently: Take 81 mg by mouth at bedtime. )  . Liniments (SALONPAS PAIN RELIEF PATCH EX) Place 1 patch onto the skin daily as needed (pain.).  Marland Kitchen lisinopril (PRINIVIL,ZESTRIL) 10 MG tablet Take 1 tablet (10 mg total) by mouth daily. (Patient taking differently: Take 10 mg by mouth at bedtime. )  . metFORMIN (GLUCOPHAGE) 500 MG tablet Take 1 tablet (500 mg total)  by mouth daily with breakfast.  . simvastatin (ZOCOR) 40 MG tablet Take 1 tablet (40 mg total) by mouth at bedtime.   No current facility-administered medications for this visit.  (Other)      REVIEW OF SYSTEMS: ROS    Positive for: Endocrine, Eyes   Negative for: Constitutional, Gastrointestinal, Neurological, Skin, Genitourinary, Musculoskeletal, HENT, Cardiovascular, Respiratory, Psychiatric, Allergic/Imm, Heme/Lymph   Last edited by Corrinne Eagle on 04/13/2019  2:11 PM. (History)       ALLERGIES No Known Allergies  PAST MEDICAL HISTORY Past Medical History:  Diagnosis Date  . Cataract    OS  . Diabetes mellitus without complication (HCC)   . Diabetic retinopathy (HCC)    NPDR OU  . Hyperlipidemia   . Hypertension   . Hypertensive retinopathy    OU   Past Surgical History:  Procedure Laterality Date  . CATARACT EXTRACTION W/PHACO Right 10/18/2018   Procedure: CATARACT EXTRACTION PHACO AND INTRAOCULAR LENS PLACEMENT (IOC);  Surgeon: Fabio Pierce, MD;  Location: [facility];  Service: Ophthalmology;  Laterality: Right;  CDE: 8.11  . CATARACT EXTRACTION W/PHACO Left 01/17/2019   Procedure: CATARACT EXTRACTION PHACO AND INTRAOCULAR LENS PLACEMENT (IOC);  Surgeon: Fabio Pierce, MD;  Location: [facility];  Service: Ophthalmology;  Laterality: Left;  CDE: 6.38  . CESAREAN SECTION     1988  .  EYE SURGERY Right    Cataract extraction OD    FAMILY HISTORY Family History  Problem Relation Age of Onset  . Cancer Mother        breast and colon cancer  . Diabetes Brother   . Diabetes Son   . Asthma Father        COPD  . Diabetes Son     SOCIAL HISTORY Social History   Tobacco Use  . Smoking status: Never Smoker  . Smokeless tobacco: Never Used  Substance Use Topics  . Alcohol use: No  . Drug use: No         OPHTHALMIC EXAM:  Base Eye Exam    Visual Acuity (Snellen - Linear)      Right Left   Dist cc 20/30 -1 20/20 -2   Dist ph cc 20/25 -1        Tonometry  (Tonopen, 2:13 PM)      Right Left   Pressure 16 14       Pupils      Dark Light Shape React APD   Right 3 2 Round Minimal 0   Left 3 2 Round Minimal 0       Visual Fields      Left Right    Full Full       Extraocular Movement      Right Left    Full Full       Neuro/Psych    Oriented x3: Yes   Mood/Affect: Normal       Dilation    Both eyes: 1.0% Mydriacyl, 2.5% Phenylephrine @ 2:14 PM        Slit Lamp and Fundus Exam    Slit Lamp Exam      Right Left   Lids/Lashes Dermatochalasis - upper lid, Meibomian gland dysfunction Dermatochalasis - upper lid, Meibomian gland dysfunction   Conjunctiva/Sclera White and quiet White and quiet   Cornea Mild Arcus, Well healed temporal cataract wounds, trace endo pigment inferiorly Mild Arcus, EBMD, trace Punctate epithelial erosions, Well healed temporal cataract wounds   Anterior Chamber Deep and quiet Deep and quiet   Iris Round and dilated, No NVI, PPM nasally Round and dilated, No NVI   Lens PC IOL in good position PC IOL in good position, trace PCO   Vitreous Vitreous syneresis, residual triamsinolone stained vitreous Vitreous syneresis       Fundus Exam      Right Left   Disc Pink and Sharp Pink and Sharp, +SVP   C/D Ratio 0.3 0.4   Macula blunted foveal reflex, central DME with mild MA and punctate exudate superior to fovea Blunted foveal reflex, central cyst, Epiretinal membrane, cluster of mild MA and exudates temporal macula -- good targets for focal laser   Vessels Vascular attenuation, Tortuous, AV crossing changes Vascular attenuation, Tortuous   Periphery Attached, scattered MAs, DBH, exudates inferior to disc - improving attached, scattered DBH greatest nasally and IN        Refraction    Wearing Rx      Sphere Cylinder Axis Add   Right [place] +0.50 142 +2.50   Left -0.75 +0.50 035 +2.50   Type: PAL          IMAGING AND PROCEDURES  Imaging and Procedures for 12/10/17  OCT, Retina - OU - Both Eyes        Right Eye Quality was good. Central Foveal Thickness: 415. Progression has improved. Findings include abnormal foveal contour, intraretinal hyper-reflective material,  intraretinal fluid, vitreomacular adhesion , no SRF (Interval improvement in IRF).   Left Eye Quality was good. Central Foveal Thickness: 422. Progression has worsened. Findings include abnormal foveal contour, intraretinal fluid, intraretinal hyper-reflective material, vitreomacular adhesion , epiretinal membrane, subretinal fluid (Persistent retinal edema with mild interval increase in IRF).   Notes Images taken, stored on drive  Diagnosis / Impression:  DME OU (OD>OS) OD: Interval improvement in IRF OS: Persistent retinal edema with mild interval increase in IRF   Clinical management:  See below  Abbreviations: NFP - Normal foveal profile. CME - cystoid macular edema. PED - pigment epithelial detachment. IRF - intraretinal fluid. SRF - subretinal fluid. EZ - ellipsoid zone. ERM - epiretinal membrane. ORA - outer retinal atrophy. ORT - outer retinal tubulation. SRHM - subretinal hyper-reflective material         Focal Laser - OS - Left Eye       LASER PROCEDURE NOTE  Diagnosis:   Diabetic macular edema, left eye   Procedure:  Focal laser photocoagulation using slit lamp laser, left eye   Anesthesia:  Topical  Surgeon: Rennis ChrisBrian Cebert Dettmann, MD, PhD   Informed consent obtained, operative eye marked, and time out performed prior to initiation of laser.   Lumenis [id number] Focal/Grid laser Power: 95 mW Duration: 50 msec  Spot size: 100 microns  # spots: 50 spots placed to MAs temporal and inferior to fovea  Complications: None.  Notes: pt with mild head tremor  RTC: 4 wks  Patient tolerated the procedure well and received written and verbal post-procedure care information/education.                   ASSESSMENT/PLAN:    ICD-10-CM   1. Moderate nonproliferative diabetic retinopathy of  both eyes with macular edema associated with type 2 diabetes mellitus (HCC)  [id number] Focal Laser - OS - Left Eye  2. Essential hypertension  I10   3. Hypertensive retinopathy of both eyes  H35.033   4. Pseudophakia of both eyes  Z96.1   5. Retinal edema  H35.81 OCT, Retina - OU - Both Eyes    1. Moderate nonproliferative diabetic retinopathy, both eyes  - Diabetic macular edema, both eyes -- relatively mild  - S/P IVA OD #1 (12.17.18), #2 (01.28.19), #3 (02.27.19), #4 (04.03.19), #5 (05.01.19), #6 (05.27.20), #7 (07.08.20)  - in reviewing her case, there was minimal response to IVA in OCT or BCVA  - S/P IVTA OD #1 (05.29.19), #2 (07.24.19), #3 (09.04.19), #4 (12.04.19), #5 (02.26.20), # 6 (08.05.20)  - slightly better response with IVTA  - today, OCT shows interval improvement in IRF OD and interval increase in IRF OS  - BCVA 20/25 OD and 20/20 OS  - IOP 16,14 mmHg OD  - OD with residual IVTA in vitreous  - OS with cluster of MA and exudates temporal macula-- very amenable to focal laser  - recommend focal laser OS today, 09.02.20, for DME OS  - pt wishes to proceed  - RBA of procedure discussed, questions answered  - informed consent obtained and signed  - see procedure note  - cont Prolensa BID OU/ Pred Forte QID OU -- pt ran out of prolensa a couple of weeks ago  - f/u 4 weeks -- DFE/OCT possible injection  2,3. Hypertensive retinopathy OU  - discussed importance of tight BP control  - monitor  4. Pseudophakia OU  - s/p CE/IOL OU (Dr. June LeapWrzosek, March/June 2020)  - beautiful surgeries, doing well  - monitor  5. Retinal edema as above   Ophthalmic Meds Ordered this visit:  Meds ordered this encounter  Medications  . Bromfenac Sodium (PROLENSA) 0.07 % SOLN    Sig: Place 1 drop into both eyes 2 (two) times daily.    Dispense:  3 mL    Refill:  5       Return in about 4 weeks (around 05/11/2019) for Dilated Exam, OCT, Possible Injxn.  There are no Patient Instructions  on file for this visit.   Explained the diagnoses, plan, and follow up with the patient and they expressed understanding.  Patient expressed understanding of the importance of proper follow up care.   This document serves as a record of services personally performed by Gardiner Sleeper, MD, PhD. It was created on their behalf by Roselee Nova, COMT. The creation of this record is the provider's dictation and/or activities during the visit.  Electronically signed by: Roselee Nova, COMT 04/13/19 5:29 PM   Gardiner Sleeper, M.D., Ph.D. Diseases & Surgery of the Retina and Vitreous [facility]  I have reviewed the above documentation for accuracy and completeness, and I agree with the above. Gardiner Sleeper, M.D., Ph.D. 03/16/19 5:29 PM    Abbreviations: M myopia (nearsighted); A astigmatism; H hyperopia (farsighted); P presbyopia; Mrx spectacle prescription;  CTL contact lenses; OD right eye; OS left eye; OU both eyes  XT exotropia; ET esotropia; PEK punctate epithelial keratitis; PEE punctate epithelial erosions; DES dry eye syndrome; MGD meibomian gland dysfunction; ATs artificial tears; PFAT's preservative free artificial tears; Cole nuclear sclerotic cataract; PSC posterior subcapsular cataract; ERM epi-retinal membrane; PVD posterior vitreous detachment; RD retinal detachment; DM diabetes mellitus; DR diabetic retinopathy; NPDR non-proliferative diabetic retinopathy; PDR proliferative diabetic retinopathy; CSME clinically significant macular edema; DME diabetic macular edema; dbh dot blot hemorrhages; CWS cotton wool spot; POAG primary open angle glaucoma; C/D cup-to-disc ratio; HVF humphrey visual field; GVF goldmann visual field; OCT optical coherence tomography; IOP intraocular pressure; BRVO Branch retinal vein occlusion; CRVO central retinal vein occlusion; CRAO central retinal artery occlusion; BRAO branch retinal artery occlusion; RT retinal tear; SB scleral buckle; PPV  pars plana vitrectomy; VH Vitreous hemorrhage; PRP panretinal laser photocoagulation; IVK intravitreal kenalog; VMT vitreomacular traction; MH Macular hole;  NVD neovascularization of the disc; NVE neovascularization elsewhere; AREDS age related eye disease study; ARMD age related macular degeneration; POAG primary open angle glaucoma; EBMD epithelial/anterior basement membrane dystrophy; ACIOL anterior chamber intraocular lens; IOL intraocular lens; PCIOL posterior chamber intraocular lens; Phaco/IOL phacoemulsification with intraocular lens placement; [place] photorefractive keratectomy; LASIK laser assisted in situ keratomileusis; HTN hypertension; DM diabetes mellitus; COPD chronic obstructive pulmonary disease

## 2019-04-13 ENCOUNTER — Encounter (INDEPENDENT_AMBULATORY_CARE_PROVIDER_SITE_OTHER): Payer: Self-pay | Admitting: Ophthalmology

## 2019-04-13 ENCOUNTER — Ambulatory Visit (INDEPENDENT_AMBULATORY_CARE_PROVIDER_SITE_OTHER): Payer: PPO | Admitting: Ophthalmology

## 2019-04-13 ENCOUNTER — Other Ambulatory Visit: Payer: Self-pay

## 2019-04-13 DIAGNOSIS — H3581 Retinal edema: Secondary | ICD-10-CM | POA: Diagnosis not present

## 2019-04-13 DIAGNOSIS — E113313 Type 2 diabetes mellitus with moderate nonproliferative diabetic retinopathy with macular edema, bilateral: Secondary | ICD-10-CM | POA: Diagnosis not present

## 2019-04-13 DIAGNOSIS — I10 Essential (primary) hypertension: Secondary | ICD-10-CM

## 2019-04-13 DIAGNOSIS — H35033 Hypertensive retinopathy, bilateral: Secondary | ICD-10-CM | POA: Diagnosis not present

## 2019-04-13 DIAGNOSIS — Z961 Presence of intraocular lens: Secondary | ICD-10-CM

## 2019-04-13 MED ORDER — PROLENSA 0.07 % OP SOLN: 1.0000 [drp] | Freq: Two times a day (BID) | OPHTHALMIC | 5 refills | Status: DC

## 2019-05-11 ENCOUNTER — Ambulatory Visit (INDEPENDENT_AMBULATORY_CARE_PROVIDER_SITE_OTHER): Payer: PPO | Admitting: Ophthalmology

## 2019-05-11 ENCOUNTER — Encounter (INDEPENDENT_AMBULATORY_CARE_PROVIDER_SITE_OTHER): Payer: Self-pay | Admitting: Ophthalmology

## 2019-05-11 ENCOUNTER — Other Ambulatory Visit: Payer: Self-pay

## 2019-05-11 DIAGNOSIS — E113313 Type 2 diabetes mellitus with moderate nonproliferative diabetic retinopathy with macular edema, bilateral: Secondary | ICD-10-CM

## 2019-05-11 DIAGNOSIS — Z961 Presence of intraocular lens: Secondary | ICD-10-CM | POA: Diagnosis not present

## 2019-05-11 DIAGNOSIS — H3581 Retinal edema: Secondary | ICD-10-CM

## 2019-05-11 DIAGNOSIS — H35033 Hypertensive retinopathy, bilateral: Secondary | ICD-10-CM

## 2019-05-11 DIAGNOSIS — I10 Essential (primary) hypertension: Secondary | ICD-10-CM | POA: Diagnosis not present

## 2019-05-11 MED ORDER — TRIAMCINOLONE ACETONIDE 40 MG/ML IO SUSP
4.0000 mg | INTRAOCULAR | Status: AC | PRN
  Administered 2019-05-11: 14:00:00 4 mg via INTRAVITREAL

## 2019-05-11 NOTE — Progress Notes (Signed)
[facility] Note  05/11/2019     CHIEF COMPLAINT Patient presents for Retina Follow Up   HISTORY OF PRESENT ILLNESS: Carolyn Hunter is a 68 y.o. female who presents to the clinic today for:   HPI    Retina Follow Up    Patient presents with  Diabetic Retinopathy.  In both eyes.  This started months ago.  Severity is moderate.  Duration of 4 weeks.  Since onset it is stable.  I, the attending physician,  performed the HPI with the patient and updated documentation appropriately.          Comments    68 y/o female pt here for 4 wk f/u for NPDR OU.  No change in [place] OU.  Denies pain, flashes, floaters.  Prolensa BID OU, PF BID OU.  BS 120 this a.m. before breakfast.  A1C 7.0 2 mos ago.  BP doing well also.       Last edited by Bernarda Caffey, MD on 05/11/2019  2:03 PM. (History)    pt states she is not seeing as well out of her right eye as she was before  Referring physician: Monico Blitz, MD [place],  [place] [phone]  HISTORICAL INFORzMATION:   Selected notes from the MEDICAL RECORD NUMBER Referral from Dr. Jeb Levering for DM exam;  Ocular Hx-  PMH- DM; high chol; HTN; Last A1C- 7.1 (9.7.18);    CURRENT MEDICATIONS: Current Outpatient Medications (Ophthalmic Drugs)  Medication Sig  . Bromfenac Sodium (PROLENSA) 0.07 % SOLN Place 1 drop into both eyes 2 (two) times daily.  . prednisoLONE acetate (PRED FORTE) 1 % ophthalmic suspension Place 1 drop into both eyes 4 (four) times daily.   No current facility-administered medications for this visit.  (Ophthalmic Drugs)   Current Outpatient Medications (Other)  Medication Sig  . aspirin EC 81 MG tablet Take 1 tablet (81 mg total) by mouth daily. (Patient taking differently: Take 81 mg by mouth at bedtime. )  . Liniments (SALONPAS PAIN RELIEF PATCH EX) Place 1 patch onto the skin daily as needed (pain.).  Marland Kitchen lisinopril (PRINIVIL,ZESTRIL) 10 MG tablet Take 1 tablet (10 mg total) by mouth daily.  (Patient taking differently: Take 10 mg by mouth at bedtime. )  . metFORMIN (GLUCOPHAGE) 500 MG tablet Take 1 tablet (500 mg total) by mouth daily with breakfast.  . simvastatin (ZOCOR) 40 MG tablet Take 1 tablet (40 mg total) by mouth at bedtime.   No current facility-administered medications for this visit.  (Other)      REVIEW OF SYSTEMS: ROS    Positive for: Endocrine, Eyes   Negative for: Constitutional, Gastrointestinal, Neurological, Skin, Genitourinary, Musculoskeletal, HENT, Cardiovascular, Respiratory, Psychiatric, Allergic/Imm, Heme/Lymph   Last edited by Matthew Folks, COA on 05/11/2019  1:08 PM. (History)       ALLERGIES No Known Allergies  PAST MEDICAL HISTORY Past Medical History:  Diagnosis Date  . Cataract    OS  . Diabetes mellitus without complication ([place])   . Diabetic retinopathy ([place])    NPDR OU  . Hyperlipidemia   . Hypertension   . Hypertensive retinopathy    OU   Past Surgical History:  Procedure Laterality Date  . CATARACT EXTRACTION W/PHACO Right 10/18/2018   Procedure: CATARACT EXTRACTION PHACO AND INTRAOCULAR LENS PLACEMENT ([place]);  Surgeon: Baruch Goldmann, MD;  Location: [facility];  Service: Ophthalmology;  Laterality: Right;  CDE: 8.11  . CATARACT EXTRACTION W/PHACO Left 01/17/2019  Procedure: CATARACT EXTRACTION PHACO AND INTRAOCULAR LENS PLACEMENT ([facility]);  Surgeon: Fabio Pierce, MD;  Location: [facility];  Service: Ophthalmology;  Laterality: Left;  CDE: 6.38  . CESAREAN SECTION     1988  . EYE SURGERY Right    Cataract extraction OD    FAMILY HISTORY Family History  Problem Relation Age of Onset  . Cancer Mother        breast and colon cancer  . Diabetes Brother   . Diabetes Son   . Asthma Father        COPD  . Diabetes Son     SOCIAL HISTORY Social History   Tobacco Use  . Smoking status: Never Smoker  . Smokeless tobacco: Never Used  Substance Use Topics  . Alcohol use: No  . Drug use: No         OPHTHALMIC  EXAM:  Base Eye Exam    Visual Acuity (Snellen - Linear)      Right Left   Dist [place] 20/30 -2 20/20 -2   Dist ph [place] NI        Tonometry (Tonopen, 1:10 PM)      Right Left   Pressure 15 13       Pupils      Dark Light Shape React APD   Right 3 2 Round Minimal None   Left 3 2 Round Minimal None       Visual Fields (Counting fingers)      Left Right    Full Full       Extraocular Movement      Right Left    Full, Ortho Full, Ortho       Neuro/Psych    Oriented x3: Yes   Mood/Affect: Normal       Dilation    Both eyes: 1.0% Mydriacyl, 2.5% Phenylephrine @ 1:10 PM        Slit Lamp and Fundus Exam    Slit Lamp Exam      Right Left   Lids/Lashes Dermatochalasis - upper lid, Meibomian gland dysfunction Dermatochalasis - upper lid, Meibomian gland dysfunction   Conjunctiva/Sclera White and quiet White and quiet   Cornea Mild Arcus, Well healed temporal cataract wounds, trace endo pigment inferiorly Mild Arcus, EBMD, trace Punctate epithelial erosions, Well healed temporal cataract wounds   Anterior Chamber Deep and quiet Deep and quiet   Iris Round and dilated, No NVI, PPM nasally Round and dilated, No NVI   Lens PC IOL in good position PC IOL in good position, trace PCO   Vitreous Vitreous syneresis, residual triamsinolone stained vitreous IT quad Vitreous syneresis       Fundus Exam      Right Left   Disc Pink and Sharp Pink and Sharp, +SVP   C/D Ratio 0.3 0.4   Macula blunted foveal reflex, central DME with mild MA and punctate exudate superior to fovea, slight interval increase in edema good foveal reflex, central cyst improved, Epiretinal membrane, cluster of mild MA and exudates temporal macula -- improved, mild focal laser scars temporal macula   Vessels Vascular attenuation, Tortuous, AV crossing changes Vascular attenuation, Tortuous   Periphery Attached, scattered MAs, DBH, exudates inferior to disc - improving attached, scattered DBH greatest nasally and IN           IMAGING AND PROCEDURES  Imaging and Procedures for 12/10/17  OCT, Retina - OU - Both Eyes       Right Eye Quality was good. Central Foveal Thickness: 420.  Progression has worsened. Findings include abnormal foveal contour, intraretinal hyper-reflective material, intraretinal fluid, vitreomacular adhesion , no SRF (Mild Interval increase in IRF/IRHM).   Left Eye Quality was good. Central Foveal Thickness: 407. Progression has improved. Findings include abnormal foveal contour, intraretinal fluid, intraretinal hyper-reflective material, vitreomacular adhesion , epiretinal membrane, subretinal fluid (Stable focal SRF, mild interval improvement in temporal IRF/IRHM).   Notes Images taken, stored on drive  Diagnosis / Impression:  DME OU (OD>OS) WU:JWJXOD:Mild Interval increase in IRF/IRHM  BJ:YNWGNFOS:Stable focal SRF, mild interval improvement in temporal IRF/IRHM   Clinical management:  See below  Abbreviations: NFP - Normal foveal profile. CME - cystoid macular edema. PED - pigment epithelial detachment. IRF - intraretinal fluid. SRF - subretinal fluid. EZ - ellipsoid zone. ERM - epiretinal membrane. ORA - outer retinal atrophy. ORT - outer retinal tubulation. SRHM - subretinal hyper-reflective material         Intravitreal Injection, Pharmacologic Agent - OD - Right Eye       Time Out 05/11/2019. 1:32 PM. Confirmed correct patient, procedure, site, and patient consented.   Anesthesia Topical anesthesia was used. Anesthetic medications included Lidocaine 2%, Proparacaine 0.5%.   Procedure Preparation included 5% betadine to ocular surface, eyelid speculum. A 27 gauge needle was used.   Injection:  4 mg Triescence 40mg /mlL injection   NDC: [phone], Lot: [id number], Expiration date: 12/08/2020   Route: Intravitreal, Site: Right Eye, Waste: 0.9 mL  Post-op Post injection exam found visual acuity of at least counting fingers. The patient tolerated the procedure well. There  were no complications. The patient received written and verbal post procedure care education.   Notes An AC tap was performed following injection due to elevated IOP using a 30 gauge needle on a syringe with the plunger removed. The needle was placed at the limbus at 7 oclock and approximately 0.05cc of aqueous was removed from the anterior chamber. Betadine was applied to the tap area before and after the paracentesis was performed. There were no complications. The patient tolerated the procedure well. The IOP was rechecked and was found to be 6 mmHg by palpation.                ASSESSMENT/PLAN:    ICD-10-CM   1. Moderate nonproliferative diabetic retinopathy of both eyes with macular edema associated with type 2 diabetes mellitus (HCC)  [id number] Intravitreal Injection, Pharmacologic Agent - OD - Right Eye    triamcinolone acetonide (TRIESENCE) 40 MG/ML subtenons injection 4 mg  2. Essential hypertension  I10   3. Hypertensive retinopathy of both eyes  H35.033   4. Pseudophakia of both eyes  Z96.1   5. Retinal edema  H35.81 OCT, Retina - OU - Both Eyes    1. Moderate nonproliferative diabetic retinopathy, both eyes  - Diabetic macular edema, both eyes -- relatively mild  - S/P IVA OD #1 (12.17.18), #2 (01.28.19), #3 (02.27.19), #4 (04.03.19), #5 (05.01.19), #6 (05.27.20), #7 (07.08.20)  - in reviewing her case, there was minimal response to IVA in OCT or BCVA  - S/P IVTA OD #1 (05.29.19), #2 (07.24.19), #3 (09.04.19), #4 (12.04.19), #5 (02.26.20), # 6 (08.05.20)  - slightly better response with IVTA  - S/P focal laser OS (09.02.20)  - today, OCT shows interval increase in IRF OD and interval improvement in temporal IRF OS  - BCVA slightly decreased to 20/30 from 20/25 OD and 20/20 OS  - IOP 15,13 mmHg OD  - OD with tr residual IVTA in vitreous  - OS  with cluster of MA and exudates temporal macula-- improved with good focal laser changes  - recommend IVTA OD # 7 (09.30.20)  - pt  wishes to proceed  - RBA of procedure discussed, questions answered  - informed consent obtained and signed  - cont Prolensa BID OU/ Pred Forte QID OU  - f/u 6 weeks -- DFE/OCT possible injection  2,3. Hypertensive retinopathy OU  - discussed importance of tight BP control  - monitor  4. Pseudophakia OU  - s/p CE/IOL OU (Dr. June Leap, March/June 2020)  - beautiful surgeries, doing well  - monitor  5. Retinal edema as above   Ophthalmic Meds Ordered this visit:  Meds ordered this encounter  Medications  . triamcinolone acetonide (TRIESENCE) 40 MG/ML subtenons injection 4 mg       Return in about 6 weeks (around 06/22/2019) for f/u NPDR OU, DFE, OCT.  There are no Patient Instructions on file for this visit.   Explained the diagnoses, plan, and follow up with the patient and they expressed understanding.  Patient expressed understanding of the importance of proper follow up care.   This document serves as a record of services personally performed by Karie Chimera, MD, PhD. It was created on their behalf by Laurian Brim, OA, an ophthalmic assistant. The creation of this record is the provider's dictation and/or activities during the visit.    Electronically signed by: Laurian Brim, OA  09.30.2020 2:08 PM    Karie Chimera, M.D., Ph.D. Diseases & Surgery of the Retina and Vitreous [facility]  I have reviewed the above documentation for accuracy and completeness, and I agree with the above. Karie Chimera, M.D., Ph.D. 05/11/19 2:08 PM   Abbreviations: M myopia (nearsighted); A astigmatism; H hyperopia (farsighted); P presbyopia; Mrx spectacle prescription;  CTL contact lenses; OD right eye; OS left eye; OU both eyes  XT exotropia; ET esotropia; PEK punctate epithelial keratitis; PEE punctate epithelial erosions; DES dry eye syndrome; MGD meibomian gland dysfunction; ATs artificial tears; PFAT's preservative free artificial tears; NSC nuclear  sclerotic cataract; PSC posterior subcapsular cataract; ERM epi-retinal membrane; PVD posterior vitreous detachment; RD retinal detachment; DM diabetes mellitus; DR diabetic retinopathy; NPDR non-proliferative diabetic retinopathy; PDR proliferative diabetic retinopathy; CSME clinically significant macular edema; DME diabetic macular edema; dbh dot blot hemorrhages; CWS cotton wool spot; POAG primary open angle glaucoma; C/D cup-to-disc ratio; HVF humphrey visual field; GVF goldmann visual field; OCT optical coherence tomography; IOP intraocular pressure; BRVO Branch retinal vein occlusion; CRVO central retinal vein occlusion; CRAO central retinal artery occlusion; BRAO branch retinal artery occlusion; RT retinal tear; SB scleral buckle; PPV pars plana vitrectomy; VH Vitreous hemorrhage; PRP panretinal laser photocoagulation; IVK intravitreal kenalog; VMT vitreomacular traction; MH Macular hole;  NVD neovascularization of the disc; NVE neovascularization elsewhere; AREDS age related eye disease study; ARMD age related macular degeneration; POAG primary open angle glaucoma; EBMD epithelial/anterior basement membrane dystrophy; ACIOL anterior chamber intraocular lens; IOL intraocular lens; PCIOL posterior chamber intraocular lens; Phaco/IOL phacoemulsification with intraocular lens placement; PRK photorefractive keratectomy; LASIK laser assisted in situ keratomileusis; HTN hypertension; DM diabetes mellitus; COPD chronic obstructive pulmonary disease

## 2019-05-12 DIAGNOSIS — I10 Essential (primary) hypertension: Secondary | ICD-10-CM | POA: Diagnosis not present

## 2019-05-12 DIAGNOSIS — E559 Vitamin D deficiency, unspecified: Secondary | ICD-10-CM | POA: Diagnosis not present

## 2019-05-12 DIAGNOSIS — Z299 Encounter for prophylactic measures, unspecified: Secondary | ICD-10-CM | POA: Diagnosis not present

## 2019-05-12 DIAGNOSIS — Z6826 Body mass index (BMI) 26.0-26.9, adult: Secondary | ICD-10-CM | POA: Diagnosis not present

## 2019-05-12 DIAGNOSIS — Z1211 Encounter for screening for malignant neoplasm of colon: Secondary | ICD-10-CM | POA: Diagnosis not present

## 2019-05-12 DIAGNOSIS — E1165 Type 2 diabetes mellitus with hyperglycemia: Secondary | ICD-10-CM | POA: Diagnosis not present

## 2019-05-12 DIAGNOSIS — Z0000 Encounter for general adult medical examination without abnormal findings: Secondary | ICD-10-CM | POA: Diagnosis not present

## 2019-05-12 DIAGNOSIS — Z7189 Other specified counseling: Secondary | ICD-10-CM | POA: Diagnosis not present

## 2019-05-12 DIAGNOSIS — Z1331 Encounter for screening for depression: Secondary | ICD-10-CM | POA: Diagnosis not present

## 2019-05-12 DIAGNOSIS — Z79899 Other long term (current) drug therapy: Secondary | ICD-10-CM | POA: Diagnosis not present

## 2019-05-12 DIAGNOSIS — E7800 Pure hypercholesterolemia, unspecified: Secondary | ICD-10-CM | POA: Diagnosis not present

## 2019-05-12 DIAGNOSIS — Z1339 Encounter for screening examination for other mental health and behavioral disorders: Secondary | ICD-10-CM | POA: Diagnosis not present

## 2019-05-12 DIAGNOSIS — Z23 Encounter for immunization: Secondary | ICD-10-CM | POA: Diagnosis not present

## 2019-06-21 NOTE — Progress Notes (Signed)
[facility] - Clinic Note  06/22/2019     CHIEF COMPLAINT Patient presents for Retina Follow Up   HISTORY OF PRESENT ILLNESS: Carolyn Hunter is a 68 y.o. female who presents to the clinic today for:   HPI    Retina Follow Up    Patient presents with  Diabetic Retinopathy.  In both eyes.  This started 6 weeks ago.  Severity is moderate.  I, the attending physician,  performed the HPI with the patient and updated documentation appropriately.          Comments    Patient here for 6 weeks retina follow up for NPDR OU. Patient states vision doing good. No eye pain.        Last edited by Rennis ChrisZamora, Wayburn Shaler, MD on 06/22/2019  2:37 PM. (History)    pt states   Referring physician: Daisy Lazarotter, Mark, DO [address] [place],  [place] [id number]  HISTORICAL INFORzMATION:   Selected notes from the MEDICAL RECORD NUMBER Referral from Dr. Fabienne BrunsM. Cotter for DM exam;  Ocular Hx-  PMH- DM; high chol; HTN; Last A1C- 7.1 (9.7.18);    CURRENT MEDICATIONS: Current Outpatient Medications (Ophthalmic Drugs)  Medication Sig  . Bromfenac Sodium (PROLENSA) 0.07 % SOLN Place 1 drop into both eyes 2 (two) times daily.  . prednisoLONE acetate (PRED FORTE) 1 % ophthalmic suspension Place 1 drop into both eyes 4 (four) times daily.   No current facility-administered medications for this visit.  (Ophthalmic Drugs)   Current Outpatient Medications (Other)  Medication Sig  . aspirin EC 81 MG tablet Take 1 tablet (81 mg total) by mouth daily. (Patient taking differently: Take 81 mg by mouth at bedtime. )  . Liniments (SALONPAS PAIN RELIEF PATCH EX) Place 1 patch onto the skin daily as needed (pain.).  Marland Kitchen. lisinopril (PRINIVIL,ZESTRIL) 10 MG tablet Take 1 tablet (10 mg total) by mouth daily. (Patient taking differently: Take 10 mg by mouth at bedtime. )  . metFORMIN (GLUCOPHAGE) 500 MG tablet Take 1 tablet (500 mg total) by mouth daily with breakfast.  . simvastatin (ZOCOR) 40 MG tablet Take 1  tablet (40 mg total) by mouth at bedtime.   No current facility-administered medications for this visit.  (Other)      REVIEW OF SYSTEMS: ROS    Positive for: Musculoskeletal, Endocrine, Cardiovascular, Eyes   Negative for: Constitutional, Gastrointestinal, Neurological, Skin, Genitourinary, HENT, Respiratory, Psychiatric, Allergic/Imm, Heme/Lymph   Last edited by Laddie Aquaslarke, Rebecca S, COA on 06/22/2019  1:59 PM. (History)       ALLERGIES No Known Allergies  PAST MEDICAL HISTORY Past Medical History:  Diagnosis Date  . Cataract    OS  . Diabetes mellitus without complication (HCC)   . Diabetic retinopathy (HCC)    NPDR OU  . Hyperlipidemia   . Hypertension   . Hypertensive retinopathy    OU   Past Surgical History:  Procedure Laterality Date  . CATARACT EXTRACTION W/PHACO Right 10/18/2018   Procedure: CATARACT EXTRACTION PHACO AND INTRAOCULAR LENS PLACEMENT (IOC);  Surgeon: Fabio PierceWrzosek, James, MD;  Location: [facility];  Service: Ophthalmology;  Laterality: Right;  CDE: 8.11  . CATARACT EXTRACTION W/PHACO Left 01/17/2019   Procedure: CATARACT EXTRACTION PHACO AND INTRAOCULAR LENS PLACEMENT (IOC);  Surgeon: Fabio PierceWrzosek, James, MD;  Location: [facility];  Service: Ophthalmology;  Laterality: Left;  CDE: 6.38  . CESAREAN SECTION     1988  . EYE SURGERY Right    Cataract extraction OD    FAMILY HISTORY  Family History  Problem Relation Age of Onset  . Cancer Mother        breast and colon cancer  . Diabetes Brother   . Diabetes Son   . Asthma Father        COPD  . Diabetes Son     SOCIAL HISTORY Social History   Tobacco Use  . Smoking status: Never Smoker  . Smokeless tobacco: Never Used  Substance Use Topics  . Alcohol use: No  . Drug use: No         OPHTHALMIC EXAM:  Base Eye Exam    Visual Acuity (Snellen - Linear)      Right Left   Dist [place] 20/30 -2 20/20 -2   Dist ph [place] NI   Uses readers.       Tonometry (Tonopen, 1:56 PM)      Right Left   Pressure 17 15        Pupils      Dark Light Shape React APD   Right 3 2 Round Minimal None   Left 3 2 Round Minimal None       Visual Fields (Counting fingers)      Left Right    Full Full       Extraocular Movement      Right Left    Full, Ortho Full, Ortho       Neuro/Psych    Oriented x3: Yes   Mood/Affect: Normal       Dilation    Both eyes: 1.0% Mydriacyl, 2.5% Phenylephrine @ 1:56 PM        Slit Lamp and Fundus Exam    Slit Lamp Exam      Right Left   Lids/Lashes Dermatochalasis - upper lid, Meibomian gland dysfunction Dermatochalasis - upper lid, Meibomian gland dysfunction   Conjunctiva/Sclera White and quiet White and quiet   Cornea Mild Arcus, Well healed temporal cataract wounds, trace endo pigment inferiorly Mild Arcus, EBMD, trace Punctate epithelial erosions, Well healed temporal cataract wounds   Anterior Chamber Deep and quiet Deep and quiet   Iris Round and dilated, No NVI, PPM nasally Round and dilated, No NVI   Lens PC IOL in good position PC IOL in good position, trace PCO   Vitreous Vitreous syneresis, residual triamcinolone-stained vitreous IT quad Vitreous syneresis       Fundus Exam      Right Left   Disc Pink and Sharp Pink and Sharp, +SVP   C/D Ratio 0.3 0.4   Macula blunted foveal reflex, central DME with mild MA and punctate exudate superior to fovea, slight interval improvement in edema good foveal reflex, central cyst improved, Epiretinal membrane, cluster of mild MA and exudates temporal macula -- improved, mild focal laser scars temporal macula   Vessels Vascular attenuation, Tortuous, AV crossing changes Vascular attenuation, Tortuous   Periphery Attached, scattered MAs, DBH, exudates inferior to disc - improving attached, scattered DBH greatest nasally and IN        Refraction    Wearing Rx      Sphere Cylinder Axis Add   Right [place] +0.50 142 +2.50   Left -0.75 +0.50 035 +2.50   Type: PAL          IMAGING AND PROCEDURES  Imaging and  Procedures for 12/10/17  OCT, Retina - OU - Both Eyes       Right Eye Quality was good. Central Foveal Thickness: 397. Progression has improved. Findings include abnormal foveal contour, intraretinal hyper-reflective  material, intraretinal fluid, no SRF, vitreous traction (Mild Interval improvement in IRF/IRHM).   Left Eye Quality was good. Central Foveal Thickness: 395. Progression has been stable. Findings include abnormal foveal contour, intraretinal fluid, intraretinal hyper-reflective material, vitreomacular adhesion , epiretinal membrane, subretinal fluid (focal SRF -- mild improvement, mild interval improvement in temporal IRF/IRHM).   Notes Images taken, stored on drive  Diagnosis / Impression:  DME OU (OD>OS) OD: Mild Interval improvement in IRF/IRHM  OS: focal SRF -- interval improvement, mild interval improvement in temporal IRF/IRHM   Clinical management:  See below  Abbreviations: NFP - Normal foveal profile. CME - cystoid macular edema. PED - pigment epithelial detachment. IRF - intraretinal fluid. SRF - subretinal fluid. EZ - ellipsoid zone. ERM - epiretinal membrane. ORA - outer retinal atrophy. ORT - outer retinal tubulation. SRHM - subretinal hyper-reflective material                  ASSESSMENT/PLAN:    ICD-10-CM   1. Moderate nonproliferative diabetic retinopathy of both eyes with macular edema associated with type 2 diabetes mellitus (HCC)  L38.1017   2. Essential hypertension  I10   3. Hypertensive retinopathy of both eyes  H35.033   4. Pseudophakia of both eyes  Z96.1   5. Retinal edema  H35.81 OCT, Retina - OU - Both Eyes    1. Moderate nonproliferative diabetic retinopathy, both eyes  - Diabetic macular edema, both eyes -- relatively mild  - S/P IVA OD #1 (12.17.18), #2 (01.28.19), #3 (02.27.19), #4 (04.03.19), #5 (05.01.19), #6 (05.27.20), #7 (07.08.20)  - in reviewing her case, there was minimal response to IVA in OCT or BCVA  - S/P IVTA OD  #1 (05.29.19), #2 (07.24.19), #3 (09.04.19), #4 (12.04.19), #5 (02.26.20), # 6 (08.05.20), # 7(09.30.20)  - slightly better response with IVTA  - S/P focal laser OS (09.02.20)  - today, OCT shows improvement in IRF OD and interval improvement in temporal IRF OS  - BCVA stable at 20/30 OD and 20/20 OS  - IOP 17,15 mmHg OD  - OD with tr residual IVTA in vitreous  - OS with cluster of MA and exudates temporal macula-- improved with good focal laser changes  - recommend holding off on injection today - pt in agreement  - cont Prolensa BID OU/ Pred Forte QID OU  - f/u 6 weeks -- DFE/OCT possible injection  2,3. Hypertensive retinopathy OU  - discussed importance of tight BP control  - monitor  4. Pseudophakia OU  - s/p CE/IOL OU (Dr. June Leap, March/June 2020)  - beautiful surgeries, doing well  - monitor  5. Retinal edema as above   Ophthalmic Meds Ordered this visit:  No orders of the defined types were placed in this encounter.      Return in about 6 weeks (around 08/03/2019) for f/u NPDR OU, DFE, OCT.  There are no Patient Instructions on file for this visit.   Explained the diagnoses, plan, and follow up with the patient and they expressed understanding.  Patient expressed understanding of the importance of proper follow up care.   This document serves as a record of services personally performed by Karie Chimera, MD, PhD. It was created on their behalf by Annalee Genta, COMT. The creation of this record is the provider's dictation and/or activities during the visit.  Electronically signed by: Annalee Genta, COMT 06/26/19 4:58 PM   This document serves as a record of services personally performed by Karie Chimera, MD, PhD. It was  created on their behalf by Ernest Mallick, OA, an ophthalmic assistant. The creation of this record is the provider's dictation and/or activities during the visit.    Electronically signed by: Ernest Mallick, OA 11.11.2020 4:58 PM   Gardiner Sleeper, M.D., Ph.D. Diseases & Surgery of the Retina and Vitreous [facility]   I have reviewed the above documentation for accuracy and completeness, and I agree with the above. Gardiner Sleeper, M.D., Ph.D. 06/26/19 4:58 PM   Abbreviations: M myopia (nearsighted); A astigmatism; H hyperopia (farsighted); P presbyopia; Mrx spectacle prescription;  CTL contact lenses; OD right eye; OS left eye; OU both eyes  XT exotropia; ET esotropia; PEK punctate epithelial keratitis; PEE punctate epithelial erosions; DES dry eye syndrome; MGD meibomian gland dysfunction; ATs artificial tears; PFAT's preservative free artificial tears; [place] nuclear sclerotic cataract; PSC posterior subcapsular cataract; ERM epi-retinal membrane; PVD posterior vitreous detachment; RD retinal detachment; DM diabetes mellitus; DR diabetic retinopathy; NPDR non-proliferative diabetic retinopathy; PDR proliferative diabetic retinopathy; CSME clinically significant macular edema; DME diabetic macular edema; dbh dot blot hemorrhages; CWS cotton wool spot; POAG primary open angle glaucoma; C/D cup-to-disc ratio; HVF humphrey visual field; GVF goldmann visual field; OCT optical coherence tomography; IOP intraocular pressure; BRVO Branch retinal vein occlusion; CRVO central retinal vein occlusion; CRAO central retinal artery occlusion; BRAO branch retinal artery occlusion; RT retinal tear; SB scleral buckle; PPV pars plana vitrectomy; VH Vitreous hemorrhage; PRP panretinal laser photocoagulation; IVK intravitreal kenalog; VMT vitreomacular traction; MH Macular hole;  NVD neovascularization of the disc; NVE neovascularization elsewhere; AREDS age related eye disease study; ARMD age related macular degeneration; POAG primary open angle glaucoma; EBMD epithelial/anterior basement membrane dystrophy; ACIOL anterior chamber intraocular lens; IOL intraocular lens; PCIOL posterior chamber intraocular lens; Phaco/IOL  phacoemulsification with intraocular lens placement; [place] photorefractive keratectomy; LASIK laser assisted in situ keratomileusis; HTN hypertension; DM diabetes mellitus; COPD chronic obstructive pulmonary disease

## 2019-06-22 ENCOUNTER — Other Ambulatory Visit: Payer: Self-pay

## 2019-06-22 ENCOUNTER — Ambulatory Visit (INDEPENDENT_AMBULATORY_CARE_PROVIDER_SITE_OTHER): Payer: PPO | Admitting: Ophthalmology

## 2019-06-22 ENCOUNTER — Encounter (INDEPENDENT_AMBULATORY_CARE_PROVIDER_SITE_OTHER): Payer: Self-pay | Admitting: Ophthalmology

## 2019-06-22 DIAGNOSIS — E113313 Type 2 diabetes mellitus with moderate nonproliferative diabetic retinopathy with macular edema, bilateral: Secondary | ICD-10-CM | POA: Diagnosis not present

## 2019-06-22 DIAGNOSIS — H35033 Hypertensive retinopathy, bilateral: Secondary | ICD-10-CM | POA: Diagnosis not present

## 2019-06-22 DIAGNOSIS — H3581 Retinal edema: Secondary | ICD-10-CM

## 2019-06-22 DIAGNOSIS — Z961 Presence of intraocular lens: Secondary | ICD-10-CM | POA: Diagnosis not present

## 2019-06-22 DIAGNOSIS — I10 Essential (primary) hypertension: Secondary | ICD-10-CM

## 2019-08-01 NOTE — Progress Notes (Signed)
[facility] - Clinic Note  08/03/2019     CHIEF COMPLAINT Patient presents for Retina Follow Up   HISTORY OF PRESENT ILLNESS: Carolyn Hunter is a 68 y.o. female who presents to the clinic today for:   HPI    Retina Follow Up    Patient presents with  Diabetic Retinopathy.  In both eyes.  Duration of 6 weeks.  Since onset it is stable.  I, the attending physician,  performed the HPI with the patient and updated documentation appropriately.          Comments    Pt states she feels like her vision is the same, she denies flashes, floaters or eye pain, blood sugar was 140 this AM, she is using Prolensa BID OU       Last edited by Rennis ChrisZamora, Marylynn Rigdon, MD on 08/03/2019  1:36 PM. (History)    pt states she feels like her right eye is doing better  Referring physician: Kirstie PeriShah, Ashish, MD [address] [place],  [place] [id number]  HISTORICAL INFORzMATION:   Selected notes from the MEDICAL RECORD NUMBER Referral from Dr. Fabienne BrunsM. Cotter for DM exam;  Ocular Hx-  PMH- DM; high chol; HTN; Last A1C- 7.1 (9.7.18);    CURRENT MEDICATIONS: Current Outpatient Medications (Ophthalmic Drugs)  Medication Sig  . Bromfenac Sodium (PROLENSA) 0.07 % SOLN Place 1 drop into both eyes 2 (two) times daily.  . prednisoLONE acetate (PRED FORTE) 1 % ophthalmic suspension Place 1 drop into both eyes 4 (four) times daily.   No current facility-administered medications for this visit. (Ophthalmic Drugs)   Current Outpatient Medications (Other)  Medication Sig  . aspirin EC 81 MG tablet Take 1 tablet (81 mg total) by mouth daily. (Patient taking differently: Take 81 mg by mouth at bedtime. )  . Liniments (SALONPAS PAIN RELIEF PATCH EX) Place 1 patch onto the skin daily as needed (pain.).  Marland Kitchen. lisinopril (PRINIVIL,ZESTRIL) 10 MG tablet Take 1 tablet (10 mg total) by mouth daily. (Patient taking differently: Take 10 mg by mouth at bedtime. )  . metFORMIN (GLUCOPHAGE) 500 MG tablet Take 1 tablet (500 mg  total) by mouth daily with breakfast.  . simvastatin (ZOCOR) 40 MG tablet Take 1 tablet (40 mg total) by mouth at bedtime.   No current facility-administered medications for this visit. (Other)      REVIEW OF SYSTEMS: ROS    Positive for: Endocrine, Eyes   Negative for: Constitutional, Gastrointestinal, Neurological, Skin, Genitourinary, Musculoskeletal, HENT, Cardiovascular, Respiratory, Psychiatric, Allergic/Imm, Heme/Lymph   Last edited by Posey BoyerBrown, Amanda J, COT on 08/03/2019 12:58 PM. (History)       ALLERGIES No Known Allergies  PAST MEDICAL HISTORY Past Medical History:  Diagnosis Date  . Cataract    OS  . Diabetes mellitus without complication (HCC)   . Diabetic retinopathy (HCC)    NPDR OU  . Hyperlipidemia   . Hypertension   . Hypertensive retinopathy    OU   Past Surgical History:  Procedure Laterality Date  . CATARACT EXTRACTION W/PHACO Right 10/18/2018   Procedure: CATARACT EXTRACTION PHACO AND INTRAOCULAR LENS PLACEMENT (IOC);  Surgeon: Fabio PierceWrzosek, James, MD;  Location: [facility];  Service: Ophthalmology;  Laterality: Right;  CDE: 8.11  . CATARACT EXTRACTION W/PHACO Left 01/17/2019   Procedure: CATARACT EXTRACTION PHACO AND INTRAOCULAR LENS PLACEMENT (IOC);  Surgeon: Fabio PierceWrzosek, James, MD;  Location: [facility];  Service: Ophthalmology;  Laterality: Left;  CDE: 6.38  . CESAREAN SECTION     1988  .  EYE SURGERY Right    Cataract extraction OD    FAMILY HISTORY Family History  Problem Relation Age of Onset  . Cancer Mother        breast and colon cancer  . Diabetes Brother   . Diabetes Son   . Asthma Father        COPD  . Diabetes Son     SOCIAL HISTORY Social History   Tobacco Use  . Smoking status: Never Smoker  . Smokeless tobacco: Never Used  Substance Use Topics  . Alcohol use: No  . Drug use: No         OPHTHALMIC EXAM:  Base Eye Exam    Visual Acuity (Snellen - Linear)      Right Left   Dist [place] 20/30 +2 20/20 -2   Dist ph [place] NI NI        Tonometry (Tonopen, 1:04 PM)      Right Left   Pressure 20 14       Pupils      Dark Light Shape React APD   Right 3 2 Round Minimal None   Left 3 2 Round Minimal None       Visual Fields (Counting fingers)      Left Right    Full Full       Extraocular Movement      Right Left    Full, Ortho Full, Ortho       Neuro/Psych    Oriented x3: Yes   Mood/Affect: Normal       Dilation    Both eyes: 1.0% Mydriacyl, 2.5% Phenylephrine @ 1:04 PM        Slit Lamp and Fundus Exam    Slit Lamp Exam      Right Left   Lids/Lashes Dermatochalasis - upper lid, Meibomian gland dysfunction Dermatochalasis - upper lid, Meibomian gland dysfunction   Conjunctiva/Sclera White and quiet White and quiet   Cornea Mild Arcus, Well healed temporal cataract wounds, trace endo pigment inferiorly Mild Arcus, EBMD, trace Punctate epithelial erosions, Well healed temporal cataract wounds   Anterior Chamber Deep and quiet Deep and quiet   Iris Round and dilated, No NVI, PPM nasally Round and dilated, No NVI   Lens PC IOL in good position PC IOL in good position, trace PCO   Vitreous Vitreous syneresis, trace residual triamcinolone inferiorly Vitreous syneresis       Fundus Exam      Right Left   Disc Pink and Sharp Pink and Sharp, +SVP   C/D Ratio 0.3 0.4   Macula blunted foveal reflex, central DME with mild MA and punctate exudate superior to fovea, slight interval increase in edema good foveal reflex, central cyst improved, Epiretinal membrane, cluster of mild MA and exudates temporal macula -- improved, mild focal laser scars temporal macula   Vessels Vascular attenuation, Tortuous, AV crossing changes Vascular attenuation, Tortuous   Periphery Attached, scattered MAs, DBH, exudates inferior to disc - improving attached, scattered DBH greatest nasally and IN          IMAGING AND PROCEDURES  Imaging and Procedures for 12/10/17  OCT, Retina - OU - Both Eyes       Right Eye Quality was  good. Central Foveal Thickness: 425. Progression has worsened. Findings include abnormal foveal contour, intraretinal hyper-reflective material, intraretinal fluid, no SRF, vitreous traction, outer retinal atrophy (Interval increase in IRF/IRHM).   Left Eye Quality was good. Central Foveal Thickness: 388. Progression has been stable. Findings  include abnormal foveal contour, intraretinal fluid, intraretinal hyper-reflective material, vitreomacular adhesion , epiretinal membrane, subretinal fluid (focal SRF -- mild improvement, mild interval improvement in temporal IRF/IRHM).   Notes Images taken, stored on drive  Diagnosis / Impression:  DME OU (OD>OS) OD: Mild Interval increase in IRF/IRHM  OS: focal SRF -- interval improvement, mild interval improvement in temporal IRF/IRHM   Clinical management:  See below  Abbreviations: NFP - Normal foveal profile. CME - cystoid macular edema. PED - pigment epithelial detachment. IRF - intraretinal fluid. SRF - subretinal fluid. EZ - ellipsoid zone. ERM - epiretinal membrane. ORA - outer retinal atrophy. ORT - outer retinal tubulation. SRHM - subretinal hyper-reflective material         Intravitreal Injection, Pharmacologic Agent - OD - Right Eye       Time Out 08/03/2019. 1:04 PM. Confirmed correct patient, procedure, site, and patient consented.   Anesthesia Topical anesthesia was used. Anesthetic medications included Lidocaine 2%, Proparacaine 0.5%.   Procedure Preparation included 5% betadine to ocular surface, eyelid speculum. A 27 gauge needle was used.   Injection:  4 mg Triescence /mlL injection   NDC: [phone], Lot: [id number], Expiration date: 12/08/2020   Route: Intravitreal, Site: Right Eye, Waste: 0.9 mL  Post-op Post injection exam found visual acuity of at least counting fingers. The patient tolerated the procedure well. There were no complications. The patient received written and verbal post procedure care education.    Notes An AC tap was performed following injection due to elevated IOP using a 30 gauge needle on a syringe with the plunger removed. The needle was placed at the limbus at 7 oclock and approximately 0.05cc of aqueous was removed from the anterior chamber. Betadine was applied to the tap area before and after the paracentesis was performed. There were no complications. The patient tolerated the procedure well. The IOP was rechecked and was found to be 9 mmHg by palpation.                ASSESSMENT/PLAN:    ICD-10-CM   1. Moderate nonproliferative diabetic retinopathy of both eyes with macular edema associated with type 2 diabetes mellitus (HCC)  U98.1191 Intravitreal Injection, Pharmacologic Agent - OD - Right Eye    triamcinolone acetonide (TRIESENCE) 40 MG/ML subtenons injection 4 mg  2. Essential hypertension  I10   3. Hypertensive retinopathy of both eyes  H35.033   4. Pseudophakia of both eyes  Z96.1   5. Retinal edema  H35.81 OCT, Retina - OU - Both Eyes  6. Combined forms of age-related cataract of left eye  H25.812   7. Pseudophakia of right eye  Z96.1   8. Combined forms of age-related cataract of both eyes  H25.813     1. Moderate nonproliferative diabetic retinopathy, both eyes  - Diabetic macular edema, both eyes -- relatively mild  - S/P IVA OD #1 (12.17.18), #2 (01.28.19), #3 (02.27.19), #4 (04.03.19), #5 (05.01.19), #6 (05.27.20), #7 (07.08.20)  - in reviewing her case, there was minimal response to IVA in OCT or BCVA  - S/P IVTA OD #1 (05.29.19), #2 (07.24.19), #3 (09.04.19), #4 (12.04.19), #5 (02.26.20), # 6 (08.05.20), # 7(09.30.20)  - slightly better response with IVTA  - S/P focal laser OS (09.02.20)  - today, OCT shows increase in IRF OD and persistent temporal IRF OS  - BCVA stable at 20/30 OD and 20/20 OS  - IOP 20,14 mmHg OD   - OD with tr residual IVTA in vitreous  - OS  with cluster of MA and exudates temporal macula-- stably improved with good focal  laser changes  - recommend IVTA OD #8 today, 12.23.20  - pt wishes to proceed  - RBA of procedure discussed, questions answered  - informed consent obtained and signed  - see procedure note  - on Prolensa BID OU/ Pred Forte QID -- only taking prolensa -- okay to stop  - f/u 10 weeks -- DFE/OCT possible injection  2,3. Hypertensive retinopathy OU  - discussed importance of tight BP control  - monitor  4. Pseudophakia OU  - s/p CE/IOL OU (Dr. June Leap, March/June 2020)  - beautiful surgeries, doing well  - monitor  5. Retinal edema as above   Ophthalmic Meds Ordered this visit:  Meds ordered this encounter  Medications  . triamcinolone acetonide (TRIESENCE) 40 MG/ML subtenons injection 4 mg       Return in about 10 weeks (around 10/12/2019) for f/u NPDR OU, DFE, OCT.  There are no Patient Instructions on file for this visit.   Explained the diagnoses, plan, and follow up with the patient and they expressed understanding.  Patient expressed understanding of the importance of proper follow up care.   This document serves as a record of services personally performed by Karie Chimera, MD, PhD. It was created on their behalf by Annalee Genta, COMT. The creation of this record is the provider's dictation and/or activities during the visit.  Electronically signed by: Annalee Genta, COMT 08/03/19 1:53 PM   This document serves as a record of services personally performed by Karie Chimera, MD, PhD. It was created on their behalf by Laurian Brim, OA, an ophthalmic assistant. The creation of this record is the provider's dictation and/or activities during the visit.    Electronically signed by: Laurian Brim, OA 12.23.2020 1:53 PM  Karie Chimera, M.D., Ph.D. Diseases & Surgery of the Retina and Vitreous [facility] 08/03/2019   I have reviewed the above documentation for accuracy and completeness, and I agree with the above. Karie Chimera, M.D., Ph.D.  08/03/19 1:53 PM   Abbreviations: M myopia (nearsighted); A astigmatism; H hyperopia (farsighted); P presbyopia; Mrx spectacle prescription;  CTL contact lenses; OD right eye; OS left eye; OU both eyes  XT exotropia; ET esotropia; PEK punctate epithelial keratitis; PEE punctate epithelial erosions; DES dry eye syndrome; MGD meibomian gland dysfunction; ATs artificial tears; PFAT's preservative free artificial tears; NSC nuclear sclerotic cataract; PSC posterior subcapsular cataract; ERM epi-retinal membrane; PVD posterior vitreous detachment; RD retinal detachment; DM diabetes mellitus; DR diabetic retinopathy; NPDR non-proliferative diabetic retinopathy; PDR proliferative diabetic retinopathy; CSME clinically significant macular edema; DME diabetic macular edema; dbh dot blot hemorrhages; CWS cotton wool spot; POAG primary open angle glaucoma; C/D cup-to-disc ratio; HVF humphrey visual field; GVF goldmann visual field; OCT optical coherence tomography; IOP intraocular pressure; BRVO Branch retinal vein occlusion; CRVO central retinal vein occlusion; CRAO central retinal artery occlusion; BRAO branch retinal artery occlusion; RT retinal tear; SB scleral buckle; PPV pars plana vitrectomy; VH Vitreous hemorrhage; PRP panretinal laser photocoagulation; IVK intravitreal kenalog; VMT vitreomacular traction; MH Macular hole;  NVD neovascularization of the disc; NVE neovascularization elsewhere; AREDS age related eye disease study; ARMD age related macular degeneration; POAG primary open angle glaucoma; EBMD epithelial/anterior basement membrane dystrophy; ACIOL anterior chamber intraocular lens; IOL intraocular lens; PCIOL posterior chamber intraocular lens; Phaco/IOL phacoemulsification with intraocular lens placement; PRK photorefractive keratectomy; LASIK laser assisted in situ keratomileusis; HTN hypertension; DM diabetes mellitus; COPD  chronic obstructive pulmonary disease

## 2019-08-03 ENCOUNTER — Other Ambulatory Visit: Payer: Self-pay

## 2019-08-03 ENCOUNTER — Ambulatory Visit (INDEPENDENT_AMBULATORY_CARE_PROVIDER_SITE_OTHER): Payer: PPO | Admitting: Ophthalmology

## 2019-08-03 ENCOUNTER — Encounter (INDEPENDENT_AMBULATORY_CARE_PROVIDER_SITE_OTHER): Payer: Self-pay | Admitting: Ophthalmology

## 2019-08-03 DIAGNOSIS — E113313 Type 2 diabetes mellitus with moderate nonproliferative diabetic retinopathy with macular edema, bilateral: Secondary | ICD-10-CM

## 2019-08-03 DIAGNOSIS — H25812 Combined forms of age-related cataract, left eye: Secondary | ICD-10-CM

## 2019-08-03 DIAGNOSIS — H3581 Retinal edema: Secondary | ICD-10-CM

## 2019-08-03 DIAGNOSIS — Z961 Presence of intraocular lens: Secondary | ICD-10-CM | POA: Diagnosis not present

## 2019-08-03 DIAGNOSIS — H35033 Hypertensive retinopathy, bilateral: Secondary | ICD-10-CM

## 2019-08-03 DIAGNOSIS — I10 Essential (primary) hypertension: Secondary | ICD-10-CM | POA: Diagnosis not present

## 2019-08-03 DIAGNOSIS — H25813 Combined forms of age-related cataract, bilateral: Secondary | ICD-10-CM | POA: Diagnosis not present

## 2019-08-03 MED ORDER — TRIAMCINOLONE ACETONIDE 40 MG/ML IO SUSP
4.0000 mg | INTRAOCULAR | Status: AC | PRN
  Administered 2019-08-03: 4 mg via INTRAVITREAL

## 2019-09-07 DIAGNOSIS — Z6827 Body mass index (BMI) 27.0-27.9, adult: Secondary | ICD-10-CM | POA: Diagnosis not present

## 2019-09-07 DIAGNOSIS — Z299 Encounter for prophylactic measures, unspecified: Secondary | ICD-10-CM | POA: Diagnosis not present

## 2019-09-07 DIAGNOSIS — I10 Essential (primary) hypertension: Secondary | ICD-10-CM | POA: Diagnosis not present

## 2019-09-07 DIAGNOSIS — E1165 Type 2 diabetes mellitus with hyperglycemia: Secondary | ICD-10-CM | POA: Diagnosis not present

## 2019-09-07 DIAGNOSIS — Z789 Other specified health status: Secondary | ICD-10-CM | POA: Diagnosis not present

## 2019-09-12 DIAGNOSIS — H02834 Dermatochalasis of left upper eyelid: Secondary | ICD-10-CM | POA: Diagnosis not present

## 2019-09-12 DIAGNOSIS — H01002 Unspecified blepharitis right lower eyelid: Secondary | ICD-10-CM | POA: Diagnosis not present

## 2019-09-12 DIAGNOSIS — H01001 Unspecified blepharitis right upper eyelid: Secondary | ICD-10-CM | POA: Diagnosis not present

## 2019-09-12 DIAGNOSIS — H18413 Arcus senilis, bilateral: Secondary | ICD-10-CM | POA: Diagnosis not present

## 2019-09-12 DIAGNOSIS — Z961 Presence of intraocular lens: Secondary | ICD-10-CM | POA: Diagnosis not present

## 2019-09-12 DIAGNOSIS — H02831 Dermatochalasis of right upper eyelid: Secondary | ICD-10-CM | POA: Diagnosis not present

## 2019-09-12 DIAGNOSIS — H01005 Unspecified blepharitis left lower eyelid: Secondary | ICD-10-CM | POA: Diagnosis not present

## 2019-09-12 DIAGNOSIS — H01004 Unspecified blepharitis left upper eyelid: Secondary | ICD-10-CM | POA: Diagnosis not present

## 2019-09-12 DIAGNOSIS — E113313 Type 2 diabetes mellitus with moderate nonproliferative diabetic retinopathy with macular edema, bilateral: Secondary | ICD-10-CM | POA: Diagnosis not present

## 2019-10-05 ENCOUNTER — Encounter: Payer: Self-pay | Admitting: Family Medicine

## 2019-10-05 ENCOUNTER — Ambulatory Visit (INDEPENDENT_AMBULATORY_CARE_PROVIDER_SITE_OTHER): Payer: PPO | Admitting: Family Medicine

## 2019-10-05 ENCOUNTER — Other Ambulatory Visit: Payer: Self-pay

## 2019-10-05 VITALS — BP 150/80 | HR 103 | Temp 96.9°F | Resp 15 | Ht 63.0 in | Wt 151.0 lb

## 2019-10-05 DIAGNOSIS — R251 Tremor, unspecified: Secondary | ICD-10-CM

## 2019-10-05 DIAGNOSIS — E119 Type 2 diabetes mellitus without complications: Secondary | ICD-10-CM | POA: Diagnosis not present

## 2019-10-05 DIAGNOSIS — I10 Essential (primary) hypertension: Secondary | ICD-10-CM | POA: Diagnosis not present

## 2019-10-05 DIAGNOSIS — E782 Mixed hyperlipidemia: Secondary | ICD-10-CM

## 2019-10-05 NOTE — Progress Notes (Signed)
Subjective:  Patient ID: Carolyn Hunter, female    DOB: 1951-04-07  Age: 69 y.o. MRN: [id number]  CC:  Chief Complaint  Patient presents with  . New Patient (Initial Visit)    establish care      HPI  HPI   Carolyn Hunter is a 69 year old female patient who presents today to establish care.  She was previously seen by Dr. Tracie Harrier back in 2019.  And then went to be seen by Dr. Sherryll Burger.  Her and her husband who is also in the room with her both here to establish care and get back in with this practice.  She has a history of cataracts, diabetes, hyperlipidemia, hypertension, diabetic retinopathy.  She reports that she is followed closely with eye care. She has a family history of having colon and breast cancer in her mother.  Father had COPD/asthma.  Several family members have diabetes.  She denies having any tobacco, alcohol or illicit drug use.  Lives with her husband who is present with her as stated above.  They have 4 children.  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  She reports she eats all food groups.  Tries to exercise regularly.  Enjoys spending time with her grandkids and just doing different activities.  Unsure when her last labs were.  She reports that she thinks they were good.  Her last A1c was 7.4 couple months ago she says other than that she is not sure what they were.  She has had her flu vaccine.  Other health maintenance items in need are colonoscopy, mammogram, foot exam, ophthalmology-she reports that she has had this recently though, DEXA scan.    Additionally she reports that her blood pressure might be elevated here in the office but it is not at home.  She brings in a piece of paper that demonstrates an average of 115-120's systolically over the 60s to 70s diastolically.  She reports taking the Norvasc 5 mg regularly.  She takes Metformin 500 mg twice daily.  And she takes Zocor.  Overall she reports she is doing well and she has no  questions or concerns or trouble at this time that she needs to discuss she just wanted to get established.  Today patient denies signs and symptoms of COVID 19 infection including fever, chills, cough, shortness of breath, and headache. Past Medical, Surgical, Social History, Allergies, and Medications have been Reviewed.   Past Medical History:  Diagnosis Date  . Cataract    OS  . Diabetes mellitus without complication (HCC)   . Diabetic retinopathy (HCC)    NPDR OU  . Hyperlipidemia   . Hypertension   . Hypertensive retinopathy    OU    Current Meds  Medication Sig  . amLODipine (NORVASC) 5 MG tablet Take 5 mg by mouth daily.  Marland Kitchen aspirin EC 81 MG tablet Take 1 tablet (81 mg total) by mouth daily. (Patient taking differently: Take 81 mg by mouth at bedtime. )  . Liniments (SALONPAS PAIN RELIEF PATCH EX) Place 1 patch onto the skin daily as needed (pain.).  Marland Kitchen metFORMIN (GLUCOPHAGE) 500 MG tablet Take 1 tablet (500 mg total) by mouth daily with breakfast. (Patient taking differently: Take 500 mg by mouth 2 (two) times daily with a meal. )  . simvastatin (ZOCOR) 40 MG tablet Take 1 tablet (40 mg total) by mouth at bedtime.  Marland Kitchen VITAMIN D PO Take 50 mcg by mouth daily.  ROS:  Review of Systems  Constitutional: Negative.   HENT: Negative.   Eyes: Negative.   Respiratory: Negative.   Cardiovascular: Negative.   Gastrointestinal: Negative.   Genitourinary: Negative.   Musculoskeletal: Negative.   Skin: Negative.   Neurological: Negative.   Endo/Heme/Allergies: Negative.   Psychiatric/Behavioral: Negative.   All other systems reviewed and are negative.   Objective:   Today's Vitals: BP (!) 150/80   Pulse (!) 103   Temp (!) 96.9 F (36.1 C) (Temporal)   Resp 15   Ht 5\' 3"  (1.6 m)   Wt 151 lb (68.5 kg)   SpO2 97%   BMI 26.75 kg/m  Vitals with BMI 10/05/2019 01/17/2019 01/17/2019  Height 5\' 3"  - -  Weight 151 lbs - -  BMI 35.57 - -  Systolic 322 025 427  Diastolic 80  79 81  Pulse 103 95 -     Physical Exam Vitals and nursing note reviewed.  Constitutional:      Appearance: Normal appearance. She is well-developed, well-groomed and overweight.  HENT:     Head: Normocephalic and atraumatic.     Right Ear: External ear normal.     Left Ear: External ear normal.     Mouth/Throat:     Comments: Mask in place Eyes:     General:        Right eye: No discharge.        Left eye: No discharge.     Conjunctiva/sclera: Conjunctivae normal.  Cardiovascular:     Rate and Rhythm: Normal rate and regular rhythm.     Pulses: Normal pulses.     Heart sounds: Normal heart sounds.  Pulmonary:     Effort: Pulmonary effort is normal.     Breath sounds: Normal breath sounds.  Musculoskeletal:        General: Normal range of motion.     Cervical back: Normal range of motion and neck supple.  Skin:    General: Skin is warm.  Neurological:     General: No focal deficit present.     Mental Status: She is alert and oriented to person, place, and time.     Motor: Tremor present.  Psychiatric:        Attention and Perception: Attention normal.        Mood and Affect: Mood normal.        Speech: Speech normal.        Behavior: Behavior normal. Behavior is cooperative.        Thought Content: Thought content normal.        Cognition and Memory: Cognition normal.        Judgment: Judgment normal.      Assessment   1. Controlled type 2 diabetes mellitus without complication, without long-term current use of insulin ([place])   2. White coat syndrome with hypertension   3. Mixed hyperlipidemia   4. Tremor     Tests ordered No orders of the defined types were placed in this encounter.    Plan: Please see ass notes from the essment and plan per problem list above.   No orders of the defined types were placed in this encounter.   Patient to follow-up in 01/05/2020   Perlie Mayo, NP

## 2019-10-05 NOTE — Patient Instructions (Signed)
I appreciate the opportunity to provide you with care for your health and wellness. Today we discussed: establish care  Follow up: 3 months for annual (fasting we will order labs that day)  No labs or referrals today  Continue to check your BP at home, if you have changes please reach out.  Please continue to practice social distancing to keep you, your family, and our community safe.  If you must go out, please wear a mask and practice good handwashing.  It was a pleasure to see you and I look forward to continuing to work together on your health and well-being. Please do not hesitate to call the office if you need care or have questions about your care.  Have a wonderful day and week. With Gratitude, Tereasa Coop, DNP, AGNP-BC

## 2019-10-09 NOTE — Assessment & Plan Note (Signed)
Whitecoat syndrome with demonstrated control at home.  Continue Norvasc 5 mg.  We will get updated labs here in the near future pending kidney function adjustment to medications can be performed.  Until then have advised to continue a low-salt diet.  Exercise 30 minutes at least 5 days a week.

## 2019-10-09 NOTE — Assessment & Plan Note (Signed)
Noted tremor she reports she has had it for almost all of her life started in her 9s.  She reports that her family members including her father had it has well.  She did not have any trouble picking up things, eating or driving.

## 2019-10-09 NOTE — Assessment & Plan Note (Signed)
Current demonstrated control with A1c of 7.4 would like to see that little bit tighter to the 7%.  Continue current medications at this time.   She is advised to follow a diabetic heart friendly diet.  In addition to taking her medications as directed including her statin medication.

## 2019-10-09 NOTE — Assessment & Plan Note (Addendum)
On Zocor.  Educated on low-fat diet.

## 2019-10-10 NOTE — Progress Notes (Addendum)
[facility] Note  10/12/2019     CHIEF COMPLAINT Patient presents for Retina Follow Up   HISTORY OF PRESENT ILLNESS: Carolyn Hunter is a 69 y.o. female who presents to the clinic today for:   HPI    Retina Follow Up    Patient presents with  Diabetic Retinopathy.  In both eyes.  This started months ago.  Severity is moderate.  Duration of 10 weeks.  Since onset it is stable.  I, the attending physician,  performed the HPI with the patient and updated documentation appropriately.          Comments    69 y/o female pt here for 10 wk f/u for mod NPDR w/mac edema OU.  No change in [place] OU.  Denies pain, flashes, floaters.  No gtts.  BS 140 today.  A1C 7.4 1 mo ago.       Last edited by Bernarda Caffey, MD on 10/12/2019  1:21 PM. (History)    pt states she feels like her right eye is doing better  Referring physician: Monico Blitz, MD [place],  [place] [id number]  HISTORICAL INFORzMATION:   Selected notes from the MEDICAL RECORD NUMBER Referral from Dr. Jeb Levering for DM exam;  Ocular Hx-  PMH- DM; high chol; HTN; Last A1C- 7.1 (9.7.18);    CURRENT MEDICATIONS: Current Outpatient Medications (Ophthalmic Drugs)  Medication Sig  . prednisoLONE acetate (PRED FORTE) 1 % ophthalmic suspension Place 1 drop into the right eye 4 (four) times daily.   No current facility-administered medications for this visit. (Ophthalmic Drugs)   Current Outpatient Medications (Other)  Medication Sig  . amLODipine (NORVASC) 5 MG tablet Take 5 mg by mouth daily.  Marland Kitchen aspirin EC 81 MG tablet Take 1 tablet (81 mg total) by mouth daily. (Patient taking differently: Take 81 mg by mouth at bedtime. )  . Liniments (SALONPAS PAIN RELIEF PATCH EX) Place 1 patch onto the skin daily as needed (pain.).  Marland Kitchen metFORMIN (GLUCOPHAGE) 500 MG tablet Take 1 tablet (500 mg total) by mouth daily with breakfast. (Patient taking differently: Take 500 mg by mouth 2 (two) times daily with a meal. )   . simvastatin (ZOCOR) 40 MG tablet Take 1 tablet (40 mg total) by mouth at bedtime.  Marland Kitchen VITAMIN D PO Take 50 mcg by mouth daily.   No current facility-administered medications for this visit. (Other)      REVIEW OF SYSTEMS: ROS    Positive for: Neurological, Endocrine, Eyes   Negative for: Constitutional, Gastrointestinal, Skin, Genitourinary, Musculoskeletal, HENT, Cardiovascular, Respiratory, Psychiatric, Allergic/Imm, Heme/Lymph   Last edited by Matthew Folks, COA on 10/12/2019  1:03 PM. (History)       ALLERGIES Allergies  Allergen Reactions  . Ace Inhibitors Cough    PAST MEDICAL HISTORY Past Medical History:  Diagnosis Date  . Cataract    OS  . Diabetes mellitus without complication ([place])   . Diabetic retinopathy ([place])    NPDR OU  . Hyperlipidemia   . Hypertension   . Hypertensive retinopathy    OU   Past Surgical History:  Procedure Laterality Date  . CATARACT EXTRACTION W/PHACO Right 10/18/2018   Procedure: CATARACT EXTRACTION PHACO AND INTRAOCULAR LENS PLACEMENT ([place]);  Surgeon: Baruch Goldmann, MD;  Location: [facility];  Service: Ophthalmology;  Laterality: Right;  CDE: 8.11  . CATARACT EXTRACTION W/PHACO Left 01/17/2019   Procedure: CATARACT EXTRACTION PHACO AND INTRAOCULAR LENS PLACEMENT ([facility]);  Surgeon: Baruch Goldmann,  MD;  Location: [facility];  Service: Ophthalmology;  Laterality: Left;  CDE: 6.38  . CESAREAN SECTION     1988  . EYE SURGERY Right    Cataract extraction OD    FAMILY HISTORY Family History  Problem Relation Age of Onset  . Cancer Mother        breast and colon cancer  . Diabetes Brother   . Diabetes Son   . Asthma Father        COPD  . Diabetes Son     SOCIAL HISTORY Social History   Tobacco Use  . Smoking status: Never Smoker  . Smokeless tobacco: Never Used  Substance Use Topics  . Alcohol use: No  . Drug use: No         OPHTHALMIC EXAM:  Base Eye Exam    Visual Acuity (Snellen - Linear)      Right Left   Dist [place]  20/30 20/15 -2   Dist ph [place] NI        Tonometry (Tonopen, 1:05 PM)      Right Left   Pressure 14 15       Pupils      Dark Light Shape React APD   Right 3 2 Round Minimal None   Left 3 2 Round Minimal None       Visual Fields (Counting fingers)      Left Right    Full Full       Extraocular Movement      Right Left    Full, Ortho Full, Ortho       Neuro/Psych    Oriented x3: Yes   Mood/Affect: Normal       Dilation    Both eyes: 1.0% Mydriacyl, 2.5% Phenylephrine @ 1:05 PM        Slit Lamp and Fundus Exam    Slit Lamp Exam      Right Left   Lids/Lashes Dermatochalasis - upper lid, Meibomian gland dysfunction Dermatochalasis - upper lid, Meibomian gland dysfunction   Conjunctiva/Sclera White and quiet White and quiet   Cornea Mild Arcus, Well healed temporal cataract wounds Mild Arcus, EBMD, 2+ Punctate epithelial erosions, Well healed temporal cataract wounds   Anterior Chamber Deep, 1+cell/pigment Deep and quiet   Iris Round and dilated, No NVI, PPM nasally Round and dilated, No NVI   Lens PC IOL in good position PC IOL in good position, trace PCO   Vitreous Vitreous syneresis, residual triamcinolone IT Vitreous syneresis       Fundus Exam      Right Left   Disc mild pallor, Sharp rim mild pallor, Sharp rim   C/D Ratio 0.3 0.4   Macula blunted foveal reflex, +edema, focal exudate, scattered MA, Epiretinal membrane good foveal reflex, Epiretinal membrane, scattered MA   Vessels Vascular attenuation, Tortuous, AV crossing changes Vascular attenuation, Tortuous, AV crossing changes   Periphery Attached, scattered MAs, DBH, exudates inferior to disc - improving attached, scattered DBH greatest nasally and IN          IMAGING AND PROCEDURES  Imaging and Procedures for 12/10/17  OCT, Retina - OU - Both Eyes       Right Eye Quality was good. Central Foveal Thickness: 545. Progression has worsened. Findings include abnormal foveal contour, intraretinal  hyper-reflective material, intraretinal fluid, no SRF, vitreous traction, outer retinal atrophy (Interval increase in IRF -- likely combination of DME/CME).   Left Eye Quality was good. Central Foveal Thickness: 390. Progression has been stable.  Findings include abnormal foveal contour, intraretinal fluid, intraretinal hyper-reflective material, vitreomacular adhesion , epiretinal membrane (mild interval improvement in temporal IRF/IRHM).   Notes Images taken, stored on drive  Diagnosis / Impression:  DME OU (OD>OS) OD: Interval increase in IRF -- likely combination of DME/CME  OS: +ERM; mild interval improvement in temporal IRF/IRHM   Clinical management:  See below  Abbreviations: NFP - Normal foveal profile. CME - cystoid macular edema. PED - pigment epithelial detachment. IRF - intraretinal fluid. SRF - subretinal fluid. EZ - ellipsoid zone. ERM - epiretinal membrane. ORA - outer retinal atrophy. ORT - outer retinal tubulation. SRHM - subretinal hyper-reflective material         Intravitreal Injection, Pharmacologic Agent - OD - Right Eye       Time Out 10/12/2019. 1:18 PM. Confirmed correct patient, procedure, site, and patient consented.   Anesthesia Topical anesthesia was used. Anesthetic medications included Lidocaine 2%, Proparacaine 0.5%.   Procedure Preparation included 5% betadine to ocular surface, eyelid speculum. A supplied (32 g) needle was used.   Injection:  2 mg aflibercept Alfonse Flavors) SOLN   NDC: 66440-347-42, Lot: [id number], Expiration date: 05/10/2020   Route: Intravitreal, Site: Right Eye, Waste: 0.05 mL  Post-op Post injection exam found visual acuity of at least counting fingers. The patient tolerated the procedure well. There were no complications. The patient received written and verbal post procedure care education.   Notes **SAMPLE MEDICATION ADMINISTERED**                ASSESSMENT/PLAN:    ICD-10-CM   1. 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 Essential hypertension  I10   3. Hypertensive retinopathy of both eyes  H35.033   4. Pseudophakia of both eyes  Z96.1   5. Retinal edema  H35.81 OCT, Retina - OU - Both Eyes  6. Combined forms of age-related cataract of left eye  H25.812   7. Pseudophakia of right eye  Z96.1   8. Combined forms of age-related cataract of both eyes  H25.813     1. Moderate nonproliferative diabetic retinopathy, both eyes  - Diabetic macular edema, both eyes -- relatively mild  - S/P IVA OD #1 (12.17.18), #2 (01.28.19), #3 (02.27.19), #4 (04.03.19), #5 (05.01.19), #6 (05.27.20), #7 (07.08.20)  - in reviewing her case, there was minimal response to IVA in OCT or BCVA  - S/P IVTA OD #1 (05.29.19), #2 (07.24.19), #3 (09.04.19), #4 (12.04.19), #5 (02.26.20), # 6 (08.05.20), # 7(09.30.20), #8 (12.23.20)  - slightly better response with IVTA, but today with interval increase macular edema today -- suspect combination of DME and CME  - S/P focal laser OS (09.02.20)  - today, OCT shows increase in IRF OD and persistent temporal IRF OS  - BCVA stable at 20/30 OD and 20/20 OS  - IOP 14,15 mmHg OD   - OD with tr residual IVTA in vitreous  - OS with cluster of MA and exudates temporal macula-- stably improved with good focal laser changes  - recommend IVE OD #1 today, 03.03.21 -- sample  - pt wishes to proceed with IVE  - RBA of procedure discussed, questions answered  - informed consent obtained  - Eylea informed consent form signed and scanned on 03.03.2021  - see procedure note  - re-start Prolensa and Pred Forte qid  OD for possible CME component  - Eylea4U benefits investigation started 03.03.21  - f/u 4 weeks -- DFE/OCT possible injection  2,3. Hypertensive retinopathy OU  - discussed importance of tight  BP control  - monitor  4. Pseudophakia OU  - s/p CE/IOL OU (Dr. Marisa Hua, [place])  - beautiful surgeries, doing well  - monitor  5. Retinal edema as above   Ophthalmic Meds Ordered this visit:  Meds ordered this encounter  Medications  . prednisoLONE acetate (PRED FORTE) 1 % ophthalmic suspension    Sig: Place 1 drop into the right eye 4 (four) times daily.    Dispense:  15 mL    Refill:  0  . aflibercept (EYLEA) SOLN 2 mg       Return in about 4 weeks (around 11/09/2019) for f/u NPDR OU, DFE, OCT.  There are no Patient Instructions on file for this visit.   Explained the diagnoses, plan, and follow up with the patient and they expressed understanding.  Patient expressed understanding of the importance of proper follow up care.   This document serves as a record of services personally performed by Gardiner Sleeper, MD, PhD. It was created on their behalf by Roselee Nova, COMT. The creation of this record is the provider's dictation and/or activities during the visit.  Electronically signed by: Roselee Nova, COMT 10/12/19 2:06 PM   This document serves as a record of services personally performed by Gardiner Sleeper, MD, PhD. It was created on their behalf by Ernest Mallick, OA, an ophthalmic assistant. The creation of this record is the provider's dictation and/or activities during the visit.    Electronically signed by: Ernest Mallick, OA 03.03.2021 2:06 PM  Gardiner Sleeper, M.D., Ph.D. Diseases & Surgery of the Retina and [place] 10/12/2019    I have reviewed the above documentation for accuracy and completeness, and I agree with the above. Gardiner Sleeper, M.D., Ph.D. 10/12/19 2:06 PM     Abbreviations: M myopia (nearsighted); A astigmatism; H hyperopia (farsighted); P presbyopia; Mrx spectacle prescription;  CTL contact lenses; OD right eye; OS left eye; OU both eyes  XT exotropia; ET esotropia; PEK punctate epithelial  keratitis; PEE punctate epithelial erosions; DES dry eye syndrome; MGD meibomian gland dysfunction; ATs artificial tears; PFAT's preservative free artificial tears; [place] nuclear sclerotic cataract; PSC posterior subcapsular cataract; ERM epi-retinal membrane; PVD posterior vitreous detachment; RD retinal detachment; DM diabetes mellitus; DR diabetic retinopathy; NPDR non-proliferative diabetic retinopathy; PDR proliferative diabetic retinopathy; CSME clinically significant macular edema; DME diabetic macular edema; dbh dot blot hemorrhages; CWS cotton wool spot; POAG primary open angle glaucoma; C/D cup-to-disc ratio; HVF humphrey visual field; GVF goldmann visual field; OCT optical coherence tomography; IOP intraocular pressure; BRVO Branch retinal vein occlusion; CRVO central retinal vein occlusion; CRAO central retinal artery occlusion; BRAO branch retinal artery occlusion; RT retinal tear; SB scleral buckle; PPV pars plana vitrectomy; VH Vitreous hemorrhage; PRP panretinal laser photocoagulation; IVK intravitreal kenalog; VMT vitreomacular traction; MH Macular hole;  NVD neovascularization of the disc; NVE neovascularization elsewhere; AREDS age related eye disease study; ARMD age related macular degeneration; POAG primary open angle glaucoma; EBMD epithelial/anterior basement membrane dystrophy; ACIOL anterior chamber intraocular lens; IOL intraocular lens; PCIOL posterior chamber intraocular lens; Phaco/IOL phacoemulsification with intraocular lens placement; [place] photorefractive keratectomy; LASIK laser assisted in situ keratomileusis; HTN hypertension; DM diabetes mellitus; COPD chronic obstructive pulmonary disease

## 2019-10-12 ENCOUNTER — Encounter (INDEPENDENT_AMBULATORY_CARE_PROVIDER_SITE_OTHER): Payer: Self-pay | Admitting: Ophthalmology

## 2019-10-12 ENCOUNTER — Ambulatory Visit (INDEPENDENT_AMBULATORY_CARE_PROVIDER_SITE_OTHER): Payer: PPO | Admitting: Ophthalmology

## 2019-10-12 DIAGNOSIS — H3581 Retinal edema: Secondary | ICD-10-CM | POA: Diagnosis not present

## 2019-10-12 DIAGNOSIS — H25813 Combined forms of age-related cataract, bilateral: Secondary | ICD-10-CM | POA: Diagnosis not present

## 2019-10-12 DIAGNOSIS — I10 Essential (primary) hypertension: Secondary | ICD-10-CM | POA: Diagnosis not present

## 2019-10-12 DIAGNOSIS — H35033 Hypertensive retinopathy, bilateral: Secondary | ICD-10-CM | POA: Diagnosis not present

## 2019-10-12 DIAGNOSIS — Z961 Presence of intraocular lens: Secondary | ICD-10-CM

## 2019-10-12 DIAGNOSIS — H25812 Combined forms of age-related cataract, left eye: Secondary | ICD-10-CM

## 2019-10-12 DIAGNOSIS — E113313 Type 2 diabetes mellitus with moderate nonproliferative diabetic retinopathy with macular edema, bilateral: Secondary | ICD-10-CM

## 2019-10-12 MED ORDER — PREDNISOLONE ACETATE 1 % OP SUSP: 1.0000 [drp] | Freq: Four times a day (QID) | OPHTHALMIC | 0 refills | Status: DC

## 2019-10-12 MED ORDER — AFLIBERCEPT 2MG/0.05ML IZ SOLN FOR KALEIDOSCOPE
2.0000 mg | INTRAVITREAL | Status: AC | PRN
  Administered 2019-10-12: 2 mg via INTRAVITREAL

## 2019-11-02 NOTE — Progress Notes (Signed)
[facility] - Clinic Note  11/09/2019     CHIEF COMPLAINT Patient presents for Retina Follow Up   HISTORY OF PRESENT ILLNESS: Carolyn Hunter is a 69 y.o. female who presents to the clinic today for:   HPI    Retina Follow Up    Patient presents with  Diabetic Retinopathy.  In both eyes.  This started weeks ago.  Severity is moderate.  Duration of weeks.  I, the attending physician,  performed the HPI with the patient and updated documentation appropriately.          Comments    Pt states her vision is about the same OU.  Pt denies eye pain or discomfort.  Pt still has same floaters she has been having OU; Denies FOL.       Last edited by Rennis Chris, MD on 11/09/2019  8:48 PM. (History)    pt states she feels like her right eye is doing better  Referring physician: Freddy Finner, NP [address] [place],  [place] [address]  HISTORICAL INFORzMATION:   Selected notes from the MEDICAL RECORD NUMBER Referral from Dr. Fabienne Bruns for DM exam;  Ocular Hx-  PMH- DM; high chol; HTN; Last A1C- 7.1 (9.7.18);    CURRENT MEDICATIONS: Current Outpatient Medications (Ophthalmic Drugs)  Medication Sig  . prednisoLONE acetate (PRED FORTE) 1 % ophthalmic suspension Place 1 drop into the right eye 4 (four) times daily.  Marland Kitchen PROLENSA 0.07 % SOLN INSTILL 1 DROP INTO BOTH EYES TWICE A DAY   No current facility-administered medications for this visit. (Ophthalmic Drugs)   Current Outpatient Medications (Other)  Medication Sig  . amLODipine (NORVASC) 5 MG tablet Take 5 mg by mouth daily.  Marland Kitchen aspirin EC 81 MG tablet Take 1 tablet (81 mg total) by mouth daily. (Patient taking differently: Take 81 mg by mouth at bedtime. )  . Liniments (SALONPAS PAIN RELIEF PATCH EX) Place 1 patch onto the skin daily as needed (pain.).  Marland Kitchen metFORMIN (GLUCOPHAGE) 500 MG tablet Take 1 tablet (500 mg total) by mouth daily with breakfast. (Patient taking differently: Take 500 mg by mouth 2 (two)  times daily with a meal. )  . simvastatin (ZOCOR) 40 MG tablet Take 1 tablet (40 mg total) by mouth at bedtime.  Marland Kitchen VITAMIN D PO Take 50 mcg by mouth daily.   No current facility-administered medications for this visit. (Other)      REVIEW OF SYSTEMS: ROS    Positive for: Neurological, Endocrine, Eyes   Negative for: Constitutional, Gastrointestinal, Skin, Genitourinary, Musculoskeletal, HENT, Cardiovascular, Respiratory, Psychiatric, Allergic/Imm, Heme/Lymph   Last edited by Corrinne Eagle on 11/09/2019  1:14 PM. (History)       ALLERGIES Allergies  Allergen Reactions  . Ace Inhibitors Cough    PAST MEDICAL HISTORY Past Medical History:  Diagnosis Date  . Cataract    OS  . Diabetes mellitus without complication (HCC)   . Diabetic retinopathy (HCC)    NPDR OU  . Hyperlipidemia   . Hypertension   . Hypertensive retinopathy    OU   Past Surgical History:  Procedure Laterality Date  . CATARACT EXTRACTION W/PHACO Right 10/18/2018   Procedure: CATARACT EXTRACTION PHACO AND INTRAOCULAR LENS PLACEMENT (IOC);  Surgeon: Fabio Pierce, MD;  Location: [facility];  Service: Ophthalmology;  Laterality: Right;  CDE: 8.11  . CATARACT EXTRACTION W/PHACO Left 01/17/2019   Procedure: CATARACT EXTRACTION PHACO AND INTRAOCULAR LENS PLACEMENT (IOC);  Surgeon: Fabio Pierce, MD;  Location: [facility];  Service: Ophthalmology;  Laterality: Left;  CDE: 6.38  . CESAREAN SECTION     1988  . EYE SURGERY Right    Cataract extraction OD    FAMILY HISTORY Family History  Problem Relation Age of Onset  . Cancer Mother        breast and colon cancer  . Diabetes Brother   . Diabetes Son   . Asthma Father        COPD  . Diabetes Son     SOCIAL HISTORY Social History   Tobacco Use  . Smoking status: Never Smoker  . Smokeless tobacco: Never Used  Substance Use Topics  . Alcohol use: No  . Drug use: No         OPHTHALMIC EXAM:  Base Eye Exam    Visual Acuity (Snellen - Linear)       Right Left   Dist [place] 20/30 -2 20/20 -2   Dist ph [place] NI        Tonometry (Tonopen, 1:17 PM)      Right Left   Pressure 16 15       Pupils      Dark Light Shape React APD   Right 2 1 Round Brisk 0   Left 2 1 Round Brisk 0       Visual Fields      Left Right    Full Full       Extraocular Movement      Right Left    Full Full       Neuro/Psych    Oriented x3: Yes   Mood/Affect: Normal       Dilation    Both eyes: 1.0% Mydriacyl, 2.5% Phenylephrine @ 1:17 PM        Slit Lamp and Fundus Exam    Slit Lamp Exam      Right Left   Lids/Lashes Dermatochalasis - upper lid, Meibomian gland dysfunction Dermatochalasis - upper lid, Meibomian gland dysfunction   Conjunctiva/Sclera White and quiet White and quiet   Cornea Mild Arcus, Well healed temporal cataract wounds Mild Arcus, EBMD, 2+ Punctate epithelial erosions, Well healed temporal cataract wounds   Anterior Chamber Deep, 1+cell/pigment Deep and quiet   Iris Round and dilated, No NVI, PPM nasally Round and dilated, No NVI   Lens PC IOL in good position PC IOL in good position, trace PCO   Vitreous Vitreous syneresis, residual triamcinolone IT Vitreous syneresis       Fundus Exam      Right Left   Disc mild pallor, Sharp rim mild pallor, Sharp rim   C/D Ratio 0.3 0.4   Macula blunted foveal reflex, interval improvement in central edema/ IRF, focal exudate, scattered MA, Epiretinal membrane good foveal reflex, Epiretinal membrane, scattered MA   Vessels Vascular attenuation, Tortuous, AV crossing changes Vascular attenuation, Tortuous, AV crossing changes   Periphery Attached, scattered MAs, DBH, exudates inferior to disc - resolved attached, scattered DBH greatest nasally and IN, focal exudates nasal midzone          IMAGING AND PROCEDURES  Imaging and Procedures for 12/10/17  OCT, Retina - OU - Both Eyes       Right Eye Quality was good. Central Foveal Thickness: 397. Progression has improved. Findings  include abnormal foveal contour, intraretinal hyper-reflective material, intraretinal fluid, no SRF, vitreous traction, outer retinal atrophy (Interval decrease in IRF -- likely combination of DME/CME, VMA ?VMT).   Left Eye Quality was good. Central Foveal  Thickness: 388. Progression has been stable. Findings include abnormal foveal contour, intraretinal fluid, intraretinal hyper-reflective material, vitreomacular adhesion , epiretinal membrane (stable improvement in temporal IRF/IRHM, persistent ERM/VMA).   Notes Images taken, stored on drive  Diagnosis / Impression:  DME OU (OD>OS) OD: Interval decrease in IRF -- likely combination of DME/CME, persistent VMA/VMT OS: +ERM; stable improvement in temporal IRF/IRHM   Clinical management:  See below  Abbreviations: NFP - Normal foveal profile. CME - cystoid macular edema. PED - pigment epithelial detachment. IRF - intraretinal fluid. SRF - subretinal fluid. EZ - ellipsoid zone. ERM - epiretinal membrane. ORA - outer retinal atrophy. ORT - outer retinal tubulation. SRHM - subretinal hyper-reflective material         Intravitreal Injection, Pharmacologic Agent - OD - Right Eye       Time Out 11/09/2019. 1:20 PM. Confirmed correct patient, procedure, site, and patient consented.   Anesthesia Topical anesthesia was used. Anesthetic medications included Lidocaine 2%, Proparacaine 0.5%.   Procedure Preparation included 5% betadine to ocular surface, eyelid speculum. A (32g) needle was used.   Injection:  2 mg aflibercept Gretta Cool) SOLN   NDC: L6038910, Lot: [id number], Expiration date: 04/06/2020   Route: Intravitreal, Site: Right Eye, Waste: 0.05 mL  Post-op Post injection exam found visual acuity of at least counting fingers. The patient tolerated the procedure well. There were no complications. The patient received written and verbal post procedure care education.                 ASSESSMENT/PLAN:    ICD-10-CM   1.  Moderate nonproliferative diabetic retinopathy of both eyes with macular edema associated with type 2 diabetes mellitus (HCC)  U98.1191 Intravitreal Injection, Pharmacologic Agent - OD - Right Eye    aflibercept (EYLEA) SOLN 2 mg  2. Essential hypertension  I10   3. Hypertensive retinopathy of both eyes  H35.033   4. Pseudophakia of both eyes  Z96.1   5. Retinal edema  H35.81 OCT, Retina - OU - Both Eyes    1. Moderate nonproliferative diabetic retinopathy, both eyes  - Diabetic macular edema, both eyes -- relatively mild  - S/P IVA OD #1 (12.17.18), #2 (01.28.19), #3 (02.27.19), #4 (04.03.19), #5 (05.01.19), #6 (05.27.20), #7 (07.08.20)  - in reviewing her case, there was minimal response to IVA in OCT or BCVA  - S/P IVTA OD #1 (05.29.19), #2 (07.24.19), #3 (09.04.19), #4 (12.04.19), #5 (02.26.20), # 6 (08.05.20), # 7(09.30.20), #8 (12.23.20)  - slightly better response with IVTA  - S/P focal laser OS (09.02.20)  - s/p IVE OD #1 (3.3.21)  - today, OCT shows improvement in IRF OD and stable improvement in IRF OS  - OS with cluster of MA and exudates temporal macula-- stably improved with good focal laser changes  - BCVA stable at 20/30 OD and 20/20 OS  - recommend IVE OD #2 today, 03.31.21  - pt wishes to proceed  - RBA of procedure discussed, questions answered  - informed consent obtained and signed  - see procedure note  - on Prolensa BID OU/ Pred Forte QID -- only taking prolensa -- okay to stop  - f/u 4 weeks -- DFE/OCT possible injection  2,3. Hypertensive retinopathy OU  - discussed importance of tight BP control  - monitor  4. Pseudophakia OU  - s/p CE/IOL OU (Dr. June Leap, March/June 2020)  - beautiful surgeries, doing well  - monitor  5. Retinal edema as above  Ophthalmic Meds Ordered this visit:  Meds ordered  this encounter  Medications  . aflibercept (EYLEA) SOLN 2 mg       Return in about 4 weeks (around 12/07/2019) for 4 weeks DME OU, Dilated exam,  OCT.  There are no Patient Instructions on file for this visit.   Explained the diagnoses, plan, and follow up with the patient and they expressed understanding.  Patient expressed understanding of the importance of proper follow up care.   Gardiner Sleeper, M.D., Ph.D. Diseases & Surgery of the Retina and [place] 11/09/2019   I have reviewed the above documentation for accuracy and completeness, and I agree with the above. Gardiner Sleeper, M.D., Ph.D. 11/09/19 8:55 PM    Abbreviations: M myopia (nearsighted); A astigmatism; H hyperopia (farsighted); P presbyopia; Mrx spectacle prescription;  CTL contact lenses; OD right eye; OS left eye; OU both eyes  XT exotropia; ET esotropia; PEK punctate epithelial keratitis; PEE punctate epithelial erosions; DES dry eye syndrome; MGD meibomian gland dysfunction; ATs artificial tears; PFAT's preservative free artificial tears; [place] nuclear sclerotic cataract; PSC posterior subcapsular cataract; ERM epi-retinal membrane; PVD posterior vitreous detachment; RD retinal detachment; DM diabetes mellitus; DR diabetic retinopathy; NPDR non-proliferative diabetic retinopathy; PDR proliferative diabetic retinopathy; CSME clinically significant macular edema; DME diabetic macular edema; dbh dot blot hemorrhages; CWS cotton wool spot; POAG primary open angle glaucoma; C/D cup-to-disc ratio; HVF humphrey visual field; GVF goldmann visual field; OCT optical coherence tomography; IOP intraocular pressure; BRVO Branch retinal vein occlusion; CRVO central retinal vein occlusion; CRAO central retinal artery occlusion; BRAO branch retinal artery occlusion; RT retinal tear; SB scleral buckle; PPV pars plana vitrectomy; VH Vitreous hemorrhage; PRP panretinal laser photocoagulation; IVK intravitreal kenalog; VMT vitreomacular traction; MH Macular hole;  NVD neovascularization of the disc; NVE neovascularization elsewhere; AREDS age related eye  disease study; ARMD age related macular degeneration; POAG primary open angle glaucoma; EBMD epithelial/anterior basement membrane dystrophy; ACIOL anterior chamber intraocular lens; IOL intraocular lens; PCIOL posterior chamber intraocular lens; Phaco/IOL phacoemulsification with intraocular lens placement; [place] photorefractive keratectomy; LASIK laser assisted in situ keratomileusis; HTN hypertension; DM diabetes mellitus; COPD chronic obstructive pulmonary disease

## 2019-11-07 ENCOUNTER — Other Ambulatory Visit (INDEPENDENT_AMBULATORY_CARE_PROVIDER_SITE_OTHER): Payer: Self-pay | Admitting: Ophthalmology

## 2019-11-09 ENCOUNTER — Ambulatory Visit (INDEPENDENT_AMBULATORY_CARE_PROVIDER_SITE_OTHER): Payer: PPO | Admitting: Ophthalmology

## 2019-11-09 ENCOUNTER — Encounter (INDEPENDENT_AMBULATORY_CARE_PROVIDER_SITE_OTHER): Payer: Self-pay | Admitting: Ophthalmology

## 2019-11-09 DIAGNOSIS — E113313 Type 2 diabetes mellitus with moderate nonproliferative diabetic retinopathy with macular edema, bilateral: Secondary | ICD-10-CM | POA: Diagnosis not present

## 2019-11-09 DIAGNOSIS — H35033 Hypertensive retinopathy, bilateral: Secondary | ICD-10-CM

## 2019-11-09 DIAGNOSIS — Z961 Presence of intraocular lens: Secondary | ICD-10-CM | POA: Diagnosis not present

## 2019-11-09 DIAGNOSIS — H3581 Retinal edema: Secondary | ICD-10-CM

## 2019-11-09 DIAGNOSIS — I10 Essential (primary) hypertension: Secondary | ICD-10-CM | POA: Diagnosis not present

## 2019-11-09 MED ORDER — AFLIBERCEPT 2MG/0.05ML IZ SOLN FOR KALEIDOSCOPE
2.0000 mg | INTRAVITREAL | Status: AC | PRN
  Administered 2019-11-09: 2 mg via INTRAVITREAL

## 2019-12-06 NOTE — Progress Notes (Signed)
[facility] Note  12/07/2019     CHIEF COMPLAINT Patient presents for Retina Follow Up   HISTORY OF PRESENT ILLNESS: Carolyn Hunter is a 69 y.o. female who presents to the clinic today for:   HPI    Retina Follow Up    Patient presents with  Other.  In both eyes.  This started 4 weeks ago.  I, the attending physician,  performed the HPI with the patient and updated documentation appropriately.          Comments    Patient here for 4 weeks retina follow up for DME OU. Patient states vision doing good. No eye pain.        Last edited by Bernarda Caffey, MD on 12/07/2019  3:30 PM. (History)    pt states  Referring physician: Perlie Mayo, NP [address] [place],   [address]  HISTORICAL INFORzMATION:   Selected notes from the MEDICAL RECORD NUMBER Referral from Dr. Jeb Levering for DM exam;  Ocular Hx-  PMH- DM; high chol; HTN; Last A1C- 7.1 (9.7.18);    CURRENT MEDICATIONS: Current Outpatient Medications (Ophthalmic Drugs)  Medication Sig  . Bromfenac Sodium 0.09 % SOLN Place 1 drop into both eyes 2 (two) times daily.  . prednisoLONE acetate (PRED FORTE) 1 % ophthalmic suspension Place 1 drop into both eyes 4 (four) times daily.  Marland Kitchen PROLENSA 0.07 % SOLN INSTILL 1 DROP INTO BOTH EYES TWICE A DAY   No current facility-administered medications for this visit. (Ophthalmic Drugs)   Current Outpatient Medications (Other)  Medication Sig  . amLODipine (NORVASC) 5 MG tablet Take 5 mg by mouth daily.  Marland Kitchen aspirin EC 81 MG tablet Take 1 tablet (81 mg total) by mouth daily. (Patient taking differently: Take 81 mg by mouth at bedtime. )  . Liniments (SALONPAS PAIN RELIEF PATCH EX) Place 1 patch onto the skin daily as needed (pain.).  Marland Kitchen metFORMIN (GLUCOPHAGE) 500 MG tablet Take 1 tablet (500 mg total) by mouth daily with breakfast. (Patient taking differently: Take 500 mg by mouth 2 (two) times daily with a meal. )  . simvastatin (ZOCOR) 40 MG tablet  Take 1 tablet (40 mg total) by mouth at bedtime.  Marland Kitchen VITAMIN D PO Take 50 mcg by mouth daily.   No current facility-administered medications for this visit. (Other)      REVIEW OF SYSTEMS: ROS    Positive for: Neurological, Endocrine, Eyes   Negative for: Constitutional, Gastrointestinal, Skin, Genitourinary, Musculoskeletal, HENT, Cardiovascular, Respiratory, Psychiatric, Allergic/Imm, Heme/Lymph   Last edited by Theodore Demark, COA on 12/07/2019  2:33 PM. (History)       ALLERGIES Allergies  Allergen Reactions  . Ace Inhibitors Cough    PAST MEDICAL HISTORY Past Medical History:  Diagnosis Date  . Cataract    OS  . Diabetes mellitus without complication ([place])   . Diabetic retinopathy ([place])    NPDR OU  . Hyperlipidemia   . Hypertension   . Hypertensive retinopathy    OU   Past Surgical History:  Procedure Laterality Date  . CATARACT EXTRACTION W/PHACO Right 10/18/2018   Procedure: CATARACT EXTRACTION PHACO AND INTRAOCULAR LENS PLACEMENT ([place]);  Surgeon: Baruch Goldmann, MD;  Location: [facility];  Service: Ophthalmology;  Laterality: Right;  CDE: 8.11  . CATARACT EXTRACTION W/PHACO Left 01/17/2019   Procedure: CATARACT EXTRACTION PHACO AND INTRAOCULAR LENS PLACEMENT ([facility]);  Surgeon: Baruch Goldmann, MD;  Location: [facility];  Service: Ophthalmology;  Laterality:  Left;  CDE: 6.38  . CESAREAN SECTION     1988  . EYE SURGERY Right    Cataract extraction OD    FAMILY HISTORY Family History  Problem Relation Age of Onset  . Cancer Mother        breast and colon cancer  . Diabetes Brother   . Diabetes Son   . Asthma Father        COPD  . Diabetes Son     SOCIAL HISTORY Social History   Tobacco Use  . Smoking status: Never Smoker  . Smokeless tobacco: Never Used  Substance Use Topics  . Alcohol use: No  . Drug use: No         OPHTHALMIC EXAM:  Base Eye Exam    Visual Acuity (Snellen - Linear)      Right Left   Dist [place] 20/30 -2 20/20 -2   Dist ph [place] NI         Tonometry (Tonopen, 2:30 PM)      Right Left   Pressure 18 17       Pupils      Dark Light Shape React APD   Right 2 1 Round Brisk None   Left 2 1 Round Brisk None       Visual Fields (Counting fingers)      Left Right    Full Full       Extraocular Movement      Right Left    Full Full       Neuro/Psych    Oriented x3: Yes   Mood/Affect: Normal       Dilation    Both eyes: 1.0% Mydriacyl, 2.5% Phenylephrine @ 2:29 PM        Slit Lamp and Fundus Exam    Slit Lamp Exam      Right Left   Lids/Lashes Dermatochalasis - upper lid, Meibomian gland dysfunction Dermatochalasis - upper lid, Meibomian gland dysfunction   Conjunctiva/Sclera White and quiet White and quiet   Cornea Mild Arcus, Well healed temporal cataract wounds Mild Arcus, EBMD, 2+ Punctate epithelial erosions, Well healed temporal cataract wounds   Anterior Chamber Deep, 1+cell/pigment Deep and quiet   Iris Round and dilated, No NVI, PPM nasally Round and dilated, No NVI   Lens PC IOL in good position PC IOL in good position, trace PCO   Vitreous Vitreous syneresis, residual triamcinolone IT Vitreous syneresis       Fundus Exam      Right Left   Disc mild pallor, Sharp rim mild pallor, Sharp rim   C/D Ratio 0.3 0.4   Macula blunted foveal reflex, persistent central edema/ IRF, focal exudate, scattered MA, Epiretinal membrane blunted foveal reflex, Epiretinal membrane, scattered MA, interval development of central cystic changes   Vessels Vascular attenuation, Tortuous, AV crossing changes Vascular attenuation, Tortuous, AV crossing changes   Periphery Attached, scattered MAs, DBH, exudates inferior to disc - resolved attached, scattered DBH greatest nasally and IN, focal exudates nasal midzone          IMAGING AND PROCEDURES  Imaging and Procedures for 12/10/17  OCT, Retina - OU - Both Eyes       Right Eye Quality was good. Central Foveal Thickness: 396. Progression has been stable.  Findings include abnormal foveal contour, intraretinal hyper-reflective material, intraretinal fluid, no SRF, vitreous traction, outer retinal atrophy (minimal decrease in IRF -- likely combination of DME/CME, VMA ?VMT).   Left Eye Quality was good. Central Foveal Thickness:  466. Progression has worsened. Findings include abnormal foveal contour, intraretinal fluid, intraretinal hyper-reflective material, vitreomacular adhesion , epiretinal membrane (Interval increase IRF/IRHM, persistent ERM/VMA).   Notes Images taken, stored on drive  Diagnosis / Impression:  DME OU (OD>OS) OD: minimal decrease in IRF -- likely combination of DME/CME, persistent VMA/VMT OS: +ERM; interval increase in IRF/IRHM   Clinical management:  See below  Abbreviations: NFP - Normal foveal profile. CME - cystoid macular edema. PED - pigment epithelial detachment. IRF - intraretinal fluid. SRF - subretinal fluid. EZ - ellipsoid zone. ERM - epiretinal membrane. ORA - outer retinal atrophy. ORT - outer retinal tubulation. SRHM - subretinal hyper-reflective material         Intravitreal Injection, Pharmacologic Agent - OD - Right Eye       Time Out 12/07/2019. 2:06 PM. Confirmed correct patient, procedure, site, and patient consented.   Anesthesia Topical anesthesia was used. Anesthetic medications included Proparacaine 0.5%.   Procedure Preparation included 5% betadine to ocular surface, eyelid speculum. A (33g) needle was used.   Injection:  2 mg aflibercept Gretta Cool) SOLN   NDC: L6038910, Lot: [id number], Expiration date: 03/06/2020   Route: Intravitreal, Site: Right Eye, Waste: 0.05 mL  Post-op Post injection exam found visual acuity of at least counting fingers. The patient tolerated the procedure well. There were no complications. The patient received written and verbal post procedure care education.        Intravitreal Injection, Pharmacologic Agent - OS - Left Eye       Time Out 12/07/2019.  3:57 PM. Confirmed correct patient, procedure, site, and patient consented.   Anesthesia Topical anesthesia was used. Anesthetic medications included Lidocaine 2%, Proparacaine 0.5%.   Procedure Preparation included 5% betadine to ocular surface, eyelid speculum. A (33g) needle was used.   Injection:  2 mg aflibercept Gretta Cool) SOLN   NDC: L6038910, Lot: [id number], Expiration date: 04/06/2020   Route: Intravitreal, Site: Left Eye, Waste: 0.05 mL  Post-op Post injection exam found visual acuity of at least counting fingers. The patient tolerated the procedure well. There were no complications. The patient received written and verbal post procedure care education.                 ASSESSMENT/PLAN:    ICD-10-CM   1. 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 Essential hypertension  I10   3. Hypertensive retinopathy of both eyes  H35.033   4. Pseudophakia of both eyes  Z96.1   5. Retinal edema  H35.81 OCT, Retina - OU - Both Eyes    1. Moderate nonproliferative diabetic retinopathy, both eyes  - Diabetic macular edema, both eyes -- relatively mild  - S/P IVA OD #1 (12.17.18), #2 (01.28.19), #3 (02.27.19), #4 (04.03.19), #5 (05.01.19), #6 (05.27.20), #7 (07.08.20)  - in reviewing her case, there was minimal response to IVA in OCT or BCVA  - S/P IVTA OD #1 (05.29.19), #2 (07.24.19), #3 (09.04.19), #4 (12.04.19), #5 (02.26.20), # 6 (08.05.20), # 7(09.30.20), #8 (12.23.20)  - slightly better response with IVTA  - S/P focal laser OS (09.02.20)  - s/p IVE OD #1 (3.3.21), #2 (03.31.21)  - today, OCT shows minimal decrease in IRF OD and interval increase in  IRF OS  - BCVA stable at 20/30 OD and 20/20  OS  - recommend IVE OU (OD #3 and OS #1) today, 04.28.21  - pt wishes to proceed  - RBA of procedure discussed, questions answered  - informed consent obtained and signed  - see procedure note  - restart Bromfenac QID OU  - continue PF QID OU  - f/u 4 weeks -- DFE/OCT possible injection  2,3. Hypertensive retinopathy OU  - discussed importance of tight BP control  - monitor  4. Pseudophakia OU  - s/p CE/IOL OU (Dr. June Leap, March/June 2020)  - beautiful surgeries, doing well  - monitor  5. Retinal edema as above  Ophthalmic Meds Ordered this visit:  Meds ordered this encounter  Medications  . prednisoLONE acetate (PRED FORTE) 1 % ophthalmic suspension    Sig: Place 1 drop into both eyes 4 (four) times daily.    Dispense:  15 mL    Refill:  0  . Bromfenac Sodium 0.09 % SOLN    Sig: Place 1 drop into both eyes 2 (two) times daily.    Dispense:  4 mL    Refill:  3  . aflibercept (EYLEA) SOLN 2 mg  . aflibercept (EYLEA) SOLN 2 mg       Return in about 4 weeks (around 01/04/2020) for f/u NPDR OU, DFE, OCT.  There are no Patient Instructions on file for this visit.   Explained the diagnoses, plan, and follow up with the patient and they expressed understanding.  Patient expressed understanding of the importance of proper follow up care.   This document serves as a record of services personally performed by Karie Chimera, MD, PhD. It was created on their behalf by Laurian Brim, OA, an ophthalmic assistant. The creation of this record is the provider's dictation and/or activities during the visit.    Electronically signed by: Laurian Brim, OA @ 11:24 PM   Karie Chimera, M.D., Ph.D. Diseases & Surgery of the Retina and Vitreous [facility]  I have reviewed the above documentation for accuracy and completeness, and I agree with the above. Karie Chimera, M.D., Ph.D. 12/08/19 11:24 PM    Abbreviations: M myopia (nearsighted); A astigmatism; H  hyperopia (farsighted); P presbyopia; Mrx spectacle prescription;  CTL contact lenses; OD right eye; OS left eye; OU both eyes  XT exotropia; ET esotropia; PEK punctate epithelial keratitis; PEE punctate epithelial erosions; DES dry eye syndrome; MGD meibomian gland dysfunction; ATs artificial tears; PFAT's preservative free artificial tears; NSC nuclear sclerotic cataract; PSC posterior subcapsular cataract; ERM epi-retinal membrane; PVD posterior vitreous detachment; RD retinal detachment; DM diabetes mellitus; DR diabetic retinopathy; NPDR non-proliferative diabetic retinopathy; PDR proliferative diabetic retinopathy; CSME clinically significant macular edema; DME diabetic macular edema; dbh dot blot hemorrhages; CWS cotton wool spot; POAG primary open angle glaucoma; C/D cup-to-disc ratio; HVF humphrey visual field; GVF goldmann visual field; OCT optical coherence tomography; IOP intraocular pressure; BRVO Branch retinal vein occlusion; CRVO central retinal vein occlusion; CRAO central retinal artery occlusion; BRAO branch retinal artery occlusion; RT retinal tear; SB scleral buckle; PPV pars plana vitrectomy; VH Vitreous hemorrhage; PRP panretinal laser photocoagulation; IVK intravitreal kenalog; VMT vitreomacular traction; MH Macular hole;  NVD neovascularization of the disc; NVE neovascularization elsewhere; AREDS age related eye disease study; ARMD age related macular degeneration; POAG primary open angle glaucoma; EBMD epithelial/anterior basement membrane dystrophy; ACIOL anterior chamber intraocular lens; IOL intraocular lens; PCIOL posterior chamber intraocular lens; Phaco/IOL phacoemulsification with intraocular  lens placement; [place] photorefractive keratectomy; LASIK laser assisted in situ keratomileusis; HTN hypertension; DM diabetes mellitus; COPD chronic obstructive pulmonary disease

## 2019-12-07 ENCOUNTER — Ambulatory Visit (INDEPENDENT_AMBULATORY_CARE_PROVIDER_SITE_OTHER): Payer: PPO | Admitting: Ophthalmology

## 2019-12-07 ENCOUNTER — Encounter (INDEPENDENT_AMBULATORY_CARE_PROVIDER_SITE_OTHER): Payer: Self-pay | Admitting: Ophthalmology

## 2019-12-07 DIAGNOSIS — E113313 Type 2 diabetes mellitus with moderate nonproliferative diabetic retinopathy with macular edema, bilateral: Secondary | ICD-10-CM | POA: Diagnosis not present

## 2019-12-07 DIAGNOSIS — H3581 Retinal edema: Secondary | ICD-10-CM

## 2019-12-07 DIAGNOSIS — Z961 Presence of intraocular lens: Secondary | ICD-10-CM | POA: Diagnosis not present

## 2019-12-07 DIAGNOSIS — I10 Essential (primary) hypertension: Secondary | ICD-10-CM

## 2019-12-07 DIAGNOSIS — H35033 Hypertensive retinopathy, bilateral: Secondary | ICD-10-CM

## 2019-12-07 MED ORDER — BROMFENAC SODIUM (ONCE-DAILY) 0.09 % OP SOLN: 1.0000 [drp] | Freq: Two times a day (BID) | OPHTHALMIC | 3 refills | Status: DC

## 2019-12-07 MED ORDER — PREDNISOLONE ACETATE 1 % OP SUSP: 1.0000 [drp] | Freq: Four times a day (QID) | OPHTHALMIC | 0 refills | Status: DC

## 2019-12-08 MED ORDER — AFLIBERCEPT 2MG/0.05ML IZ SOLN FOR KALEIDOSCOPE
2.0000 mg | INTRAVITREAL | Status: AC | PRN
  Administered 2019-12-08: 2 mg via INTRAVITREAL

## 2020-01-03 NOTE — Progress Notes (Signed)
[facility] - Clinic Note  01/06/2020     CHIEF COMPLAINT Patient presents for Retina Follow Up   HISTORY OF PRESENT ILLNESS: Carolyn Hunter is a 69 y.o. female who presents to the clinic today for:   HPI    Retina Follow Up    Patient presents with  Diabetic Retinopathy.  In both eyes.  This started 4 weeks ago.  Severity is moderate.  I, the attending physician,  performed the HPI with the patient and updated documentation appropriately.          Comments    Patient here for 4 weeks retina follow up for NPDR OU. Patient states vision doing good. No eye pain.       Last edited by Rennis Chris, MD on 01/08/2020  8:37 PM. (History)    pt states  Referring physician: Freddy Finner, NP [address] [place],  [place] [address]  HISTORICAL INFORzMATION:   Selected notes from the MEDICAL RECORD NUMBER Referral from Dr. Fabienne Bruns for DM exam   CURRENT MEDICATIONS: Current Outpatient Medications (Ophthalmic Drugs)  Medication Sig  . Bromfenac Sodium 0.09 % SOLN Place 1 drop into both eyes 2 (two) times daily.  . prednisoLONE acetate (PRED FORTE) 1 % ophthalmic suspension Place 1 drop into both eyes 4 (four) times daily.  Marland Kitchen PROLENSA 0.07 % SOLN INSTILL 1 DROP INTO BOTH EYES TWICE A DAY   No current facility-administered medications for this visit. (Ophthalmic Drugs)   Current Outpatient Medications (Other)  Medication Sig  . amLODipine (NORVASC) 5 MG tablet Take 1 tablet (5 mg total) by mouth daily.  Marland Kitchen aspirin EC 81 MG tablet Take 1 tablet (81 mg total) by mouth daily. (Patient taking differently: Take 81 mg by mouth at bedtime. )  . Liniments (SALONPAS PAIN RELIEF PATCH EX) Place 1 patch onto the skin daily as needed (pain.).  Marland Kitchen metFORMIN (GLUCOPHAGE) 500 MG tablet Take 1 tablet (500 mg total) by mouth daily with breakfast. (Patient taking differently: Take 500 mg by mouth 2 (two) times daily with a meal. )  . simvastatin (ZOCOR) 40 MG tablet Take 1 tablet  (40 mg total) by mouth at bedtime.  Marland Kitchen VITAMIN D PO Take 50 mcg by mouth daily.   No current facility-administered medications for this visit. (Other)      REVIEW OF SYSTEMS: ROS    Positive for: Neurological, Endocrine, Eyes   Negative for: Constitutional, Gastrointestinal, Skin, Genitourinary, Musculoskeletal, HENT, Cardiovascular, Respiratory, Psychiatric, Allergic/Imm, Heme/Lymph   Last edited by Laddie Aquas, COA on 01/06/2020  2:41 PM. (History)       ALLERGIES Allergies  Allergen Reactions  . Ace Inhibitors Cough    PAST MEDICAL HISTORY Past Medical History:  Diagnosis Date  . Cataract    OS  . Diabetes mellitus without complication (HCC)   . Diabetic retinopathy (HCC)    NPDR OU  . Hyperlipidemia   . Hypertension   . Hypertensive retinopathy    OU   Past Surgical History:  Procedure Laterality Date  . CATARACT EXTRACTION W/PHACO Right 10/18/2018   Procedure: CATARACT EXTRACTION PHACO AND INTRAOCULAR LENS PLACEMENT (IOC);  Surgeon: Fabio Pierce, MD;  Location: [facility];  Service: Ophthalmology;  Laterality: Right;  CDE: 8.11  . CATARACT EXTRACTION W/PHACO Left 01/17/2019   Procedure: CATARACT EXTRACTION PHACO AND INTRAOCULAR LENS PLACEMENT (IOC);  Surgeon: Fabio Pierce, MD;  Location: [facility];  Service: Ophthalmology;  Laterality: Left;  CDE: 6.38  . CESAREAN  SECTION     1988  . EYE SURGERY Right    Cataract extraction OD    FAMILY HISTORY Family History  Problem Relation Age of Onset  . Cancer Mother        breast and colon cancer  . Diabetes Brother   . Diabetes Son   . Asthma Father        COPD  . Diabetes Son     SOCIAL HISTORY Social History   Tobacco Use  . Smoking status: Never Smoker  . Smokeless tobacco: Never Used  Substance Use Topics  . Alcohol use: No  . Drug use: No         OPHTHALMIC EXAM:  Base Eye Exam    Visual Acuity (Snellen - Linear)      Right Left   Dist [place] 20/25 -2 20/20 -1   Dist ph [place] NI         Tonometry (Tonopen, 2:38 PM)      Right Left   Pressure 20 16       Pupils      Dark Light Shape React APD   Right 2 1 Round Brisk None   Left 2 1 Round Brisk None       Visual Fields (Counting fingers)      Left Right    Full Full       Extraocular Movement      Right Left    Full, Ortho Full, Ortho       Neuro/Psych    Oriented x3: Yes   Mood/Affect: Normal       Dilation    Both eyes: 1.0% Mydriacyl, 2.5% Phenylephrine @ 2:38 PM        Slit Lamp and Fundus Exam    Slit Lamp Exam      Right Left   Lids/Lashes Dermatochalasis - upper lid, Meibomian gland dysfunction Dermatochalasis - upper lid, Meibomian gland dysfunction   Conjunctiva/Sclera White and quiet White and quiet   Cornea Mild Arcus, Well healed temporal cataract wounds Mild Arcus, EBMD, 2+ Punctate epithelial erosions, Well healed temporal cataract wounds   Anterior Chamber Deep, 1+cell/pigment Deep and quiet   Iris Round and dilated, No NVI, PPM nasally Round and dilated, No NVI   Lens PC IOL in good position PC IOL in good position, trace PCO   Vitreous Vitreous syneresis, residual triamcinolone IT Vitreous syneresis       Fundus Exam      Right Left   Disc mild pallor, Sharp rim mild pallor, Sharp rim   C/D Ratio 0.3 0.4   Macula blunted foveal reflex, persistent central edema/ IRF--slightly improved, focal exudate, scattered MA, epiretinal membrane blunted foveal reflex, epiretinal membrane, scattered MA, interval improvement in central cystic changes   Vessels Vascular attenuation, Tortuous, AV crossing changes Vascular attenuation, Tortuous, AV crossing changes   Periphery Attached, scattered MAs, DBH, exudates inferior to disc - resolved attached, scattered DBH greatest nasally and IN, focal exudates nasal midzone          IMAGING AND PROCEDURES  Imaging and Procedures for 12/10/17  OCT, Retina - OU - Both Eyes       Right Eye Quality was good. Central Foveal Thickness: 380.  Progression has improved. Findings include abnormal foveal contour, intraretinal hyper-reflective material, intraretinal fluid, no SRF, vitreous traction, outer retinal atrophy (Mild improvement in central IRF -- likely combination of DME/CME, VMA ?VMT. + IRHM).   Left Eye Quality was good. Central Foveal Thickness: 389. Progression  has improved. Findings include abnormal foveal contour, intraretinal fluid, intraretinal hyper-reflective material, vitreomacular adhesion , epiretinal membrane (Interval decrease IRF/IRHM, persistent ERM/VMA).   Notes Images taken, stored on drive  Diagnosis / Impression:  DME OU (OD>OS) OD: Mild improvement in central IRF -- likely combination of DME/CME, VMA ?VMT. + IRHM OS: Interval decrease IRF/IRHM, persistent ERM/VMA  Clinical management:  See below  Abbreviations: NFP - Normal foveal profile. CME - cystoid macular edema. PED - pigment epithelial detachment. IRF - intraretinal fluid. SRF - subretinal fluid. EZ - ellipsoid zone. ERM - epiretinal membrane. ORA - outer retinal atrophy. ORT - outer retinal tubulation. SRHM - subretinal hyper-reflective material         Intravitreal Injection, Pharmacologic Agent - OD - Right Eye       Time Out 01/06/2020. 3:10 PM. Confirmed correct patient, procedure, site, and patient consented.   Anesthesia Topical anesthesia was used. Anesthetic medications included Lidocaine 2%, Proparacaine 0.5%.   Procedure Preparation included 5% betadine to ocular surface, eyelid speculum. A (32g) needle was used.   Injection:  2 mg aflibercept Gretta Cool) SOLN   NDC: L6038910, Lot: [id number], Expiration date: 05/07/2020   Route: Intravitreal, Site: Right Eye, Waste: 0.05 mL  Post-op Post injection exam found visual acuity of at least counting fingers. The patient tolerated the procedure well. There were no complications. The patient received written and verbal post procedure care education.        Intravitreal  Injection, Pharmacologic Agent - OS - Left Eye       Time Out 01/06/2020. 3:12 PM. Confirmed correct patient, procedure, site, and patient consented.   Anesthesia Topical anesthesia was used. Anesthetic medications included Lidocaine 2%, Proparacaine 0.5%.   Procedure Preparation included eyelid speculum, 5% betadine to ocular surface. A (32g) needle was used.   Injection:  2 mg aflibercept Gretta Cool) SOLN   NDC: L6038910, Lot: [id number], Expiration date: 05/07/2020   Route: Intravitreal, Site: Left Eye, Waste: 0.05 mL  Post-op Post injection exam found visual acuity of at least counting fingers. The patient tolerated the procedure well. There were no complications. The patient received written and verbal post procedure care education.                 ASSESSMENT/PLAN:    ICD-10-CM   1. Moderate nonproliferative diabetic retinopathy of both eyes with macular edema associated with type 2 diabetes mellitus (HCC)  M62.8638 Intravitreal Injection, Pharmacologic Agent - OD - Right Eye    Intravitreal Injection, Pharmacologic Agent - OS - Left Eye    aflibercept (EYLEA) SOLN 2 mg    aflibercept (EYLEA) SOLN 2 mg  2. Essential hypertension  I10   3. Hypertensive retinopathy of both eyes  H35.033   4. Pseudophakia of both eyes  Z96.1   5. Retinal edema  H35.81 OCT, Retina - OU - Both Eyes    1. Moderate nonproliferative diabetic retinopathy, both eyes  - Diabetic macular edema, both eyes -- relatively mild  - S/P IVA OD #1 (12.17.18), #2 (01.28.19), #3 (02.27.19), #4 (04.03.19), #5 (05.01.19), #6 (05.27.20), #7 (07.08.20)  - in reviewing her case, there was minimal response to IVA in OCT or BCVA  - S/P IVTA OD #1 (05.29.19), #2 (07.24.19), #3 (09.04.19), #4 (12.04.19), #5 (02.26.20), # 6 (08.05.20), # 7(09.30.20), #8 (12.23.20)  - slightly better response with IVTA  - S/P focal laser OS (09.02.20)  - s/p IVE OD #1 (3.3.21), #2 (03.31.21), #3 (04.28.21)  - s/p IVE OS #1  (04.28.21)  -  today, OCT shows persistent but improved IRF OU  - BCVA stable at 20/25 OD and 20/20 OS  - recommend IVE OU (OD #4 and OS #2) today, 05.28.21  - pt wishes to proceed  - RBA of procedure discussed, questions answered  - informed consent obtained and signed  - see procedure note  - cont Bromfenac QID OU  - continue PF QID OU  - f/u 5-6 weeks -- DFE/OCT possible injection  2,3. Hypertensive retinopathy OU  - discussed importance of tight BP control  - monitor  4. Pseudophakia OU  - s/p CE/IOL OU (Dr. Marisa Hua, [place])  - beautiful surgeries, doing well  - monitor  5. Retinal edema as above  Ophthalmic Meds Ordered this visit:  Meds ordered this encounter  Medications  . aflibercept (EYLEA) SOLN 2 mg  . aflibercept (EYLEA) SOLN 2 mg       Return in about 5 weeks (around 02/10/2020) for 5-6 wk f/u for NPDR OU w/DFE, OCT and poss. inj. OU.  There are no Patient Instructions on file for this visit.   Explained the diagnoses, plan, and follow up with the patient and they expressed understanding.  Patient expressed understanding of the importance of proper follow up care.   This document serves as a record of services personally performed by Gardiner Sleeper, MD, PhD. It was created on their behalf by Ernest Mallick, OA, an ophthalmic assistant. The creation of this record is the provider's dictation and/or activities during the visit.    Electronically signed by: Ernest Mallick, OA 05.25.2021 8:50 PM   Gardiner Sleeper, M.D., Ph.D. Diseases & Surgery of the Retina and Vitreous [facility]  I have reviewed the above documentation for accuracy and completeness, and I agree with the above. Gardiner Sleeper, M.D., Ph.D. 01/08/20 8:50 PM   Abbreviations: M myopia (nearsighted); A astigmatism; H hyperopia (farsighted); P presbyopia; Mrx spectacle prescription;  CTL contact lenses; OD right eye; OS left eye; OU both eyes  XT exotropia; ET  esotropia; PEK punctate epithelial keratitis; PEE punctate epithelial erosions; DES dry eye syndrome; MGD meibomian gland dysfunction; ATs artificial tears; PFAT's preservative free artificial tears; [place] nuclear sclerotic cataract; PSC posterior subcapsular cataract; ERM epi-retinal membrane; PVD posterior vitreous detachment; RD retinal detachment; DM diabetes mellitus; DR diabetic retinopathy; NPDR non-proliferative diabetic retinopathy; PDR proliferative diabetic retinopathy; CSME clinically significant macular edema; DME diabetic macular edema; dbh dot blot hemorrhages; CWS cotton wool spot; POAG primary open angle glaucoma; C/D cup-to-disc ratio; HVF humphrey visual field; GVF goldmann visual field; OCT optical coherence tomography; IOP intraocular pressure; BRVO Branch retinal vein occlusion; CRVO central retinal vein occlusion; CRAO central retinal artery occlusion; BRAO branch retinal artery occlusion; RT retinal tear; SB scleral buckle; PPV pars plana vitrectomy; VH Vitreous hemorrhage; PRP panretinal laser photocoagulation; IVK intravitreal kenalog; VMT vitreomacular traction; MH Macular hole;  NVD neovascularization of the disc; NVE neovascularization elsewhere; AREDS age related eye disease study; ARMD age related macular degeneration; POAG primary open angle glaucoma; EBMD epithelial/anterior basement membrane dystrophy; ACIOL anterior chamber intraocular lens; IOL intraocular lens; PCIOL posterior chamber intraocular lens; Phaco/IOL phacoemulsification with intraocular lens placement; [place] photorefractive keratectomy; LASIK laser assisted in situ keratomileusis; HTN hypertension; DM diabetes mellitus; COPD chronic obstructive pulmonary disease

## 2020-01-05 ENCOUNTER — Ambulatory Visit (INDEPENDENT_AMBULATORY_CARE_PROVIDER_SITE_OTHER): Payer: PPO | Admitting: Family Medicine

## 2020-01-05 ENCOUNTER — Other Ambulatory Visit: Payer: Self-pay

## 2020-01-05 ENCOUNTER — Encounter (INDEPENDENT_AMBULATORY_CARE_PROVIDER_SITE_OTHER): Payer: Self-pay | Admitting: *Deleted

## 2020-01-05 ENCOUNTER — Encounter: Payer: Self-pay | Admitting: Family Medicine

## 2020-01-05 ENCOUNTER — Other Ambulatory Visit: Payer: Self-pay | Admitting: *Deleted

## 2020-01-05 VITALS — BP 152/82 | HR 108 | Temp 97.8°F | Ht 63.0 in | Wt 151.4 lb

## 2020-01-05 DIAGNOSIS — Z1231 Encounter for screening mammogram for malignant neoplasm of breast: Secondary | ICD-10-CM

## 2020-01-05 DIAGNOSIS — Z1211 Encounter for screening for malignant neoplasm of colon: Secondary | ICD-10-CM | POA: Diagnosis not present

## 2020-01-05 DIAGNOSIS — E119 Type 2 diabetes mellitus without complications: Secondary | ICD-10-CM | POA: Diagnosis not present

## 2020-01-05 DIAGNOSIS — I10 Essential (primary) hypertension: Secondary | ICD-10-CM | POA: Insufficient documentation

## 2020-01-05 DIAGNOSIS — E782 Mixed hyperlipidemia: Secondary | ICD-10-CM | POA: Diagnosis not present

## 2020-01-05 DIAGNOSIS — R9431 Abnormal electrocardiogram [ECG] [EKG]: Secondary | ICD-10-CM

## 2020-01-05 DIAGNOSIS — Z0001 Encounter for general adult medical examination with abnormal findings: Secondary | ICD-10-CM

## 2020-01-05 DIAGNOSIS — Z23 Encounter for immunization: Secondary | ICD-10-CM

## 2020-01-05 DIAGNOSIS — Z13820 Encounter for screening for osteoporosis: Secondary | ICD-10-CM | POA: Diagnosis not present

## 2020-01-05 LAB — POCT GLYCOSYLATED HEMOGLOBIN (HGB A1C)
HbA1c POC (<> result, manual entry): 7.6 % (ref 4.0–5.6)
HbA1c, POC (controlled diabetic range): 7.6 % — AB (ref 0.0–7.0)
HbA1c, POC (prediabetic range): 7.6 % — AB (ref 5.7–6.4)
Hemoglobin A1C: 7.6 % — AB (ref 4.0–5.6)

## 2020-01-05 MED ORDER — SIMVASTATIN 40 MG PO TABS: 40.0000 mg | ORAL_TABLET | Freq: Every day | ORAL | 1 refills | Status: DC

## 2020-01-05 MED ORDER — AMLODIPINE BESYLATE 5 MG PO TABS: 5.0000 mg | ORAL_TABLET | Freq: Every day | ORAL | 0 refills | Status: DC

## 2020-01-05 NOTE — Assessment & Plan Note (Signed)
Discussed monthly self breast exams and yearly mammograms; at least 30 minutes of aerobic activity at least 5 days/week and weight-bearing exercise 2x/week; proper sunscreen use reviewed; healthy diet, including goals of calcium and vitamin D intake and alcohol recommendations (less than or equal to 1 drink/day) reviewed; regular seatbelt use; changing batteries in smoke detectors.  Immunization recommendations discussed.  Colonoscopy recommendations reviewed. Orders for mammogram, colonoscopy, bone scan, updated labs, and urine ordered today.

## 2020-01-05 NOTE — Progress Notes (Signed)
Health Maintenance reviewed -   Immunization History  Administered Date(s) Administered  . Influenza,inj,Quad PF,6+ Mos 04/17/2017, 04/20/2018  . Moderna SARS-COVID-2 Vaccination 10/06/2019, 11/04/2019  . Pneumococcal Conjugate-13 04/17/2017, 01/05/2020  . Pneumococcal Polysaccharide-23 01/05/2018   Last Pap smear: 2019 pap was normal  Last mammogram: ordered today Last colonoscopy: referral today Last DEXA: ordered  Dentist: making appts for June, prior to covid it was every 6 months  Ophtho: Dr Vanessa Barbara monthly. Exercise: walking couple miles usually daily  Other doctors caring for patient include:  Patient Care Team: Freddy Finner, NP as PCP - General (Family Medicine)  End of Life Discussion:  Patient does not have a living will and medical power of attorney. Provided today.    Code Status: Not on file   Subjective:   HPI  Carolyn Hunter is a 69 y.o. female who presents for annual wellness visit and follow-up on chronic medical conditions.  She has the following concerns: Denies having any issues or concerns today. Denies having any skin issues.  Denies having any sleep trouble.  Denies having any changes in chewing, swallowing or eating.  Denies having any upset stomach or change in bowel or bladder habits.  Denies having any blood in urine or stool.  Denies having any recent falls.  Denies having any mood changes.  Denies having any memory changes.  Denies having any chest pain, shortness of breath, leg swelling, palpitations, headaches, dizziness or vision changes.  Health maintenance wise A1c today in the office.  Needs mammogram, bone scan, colonoscopy referral, foot exam in the office today, urine screening.  Additionally is due for pneumonia vaccine   Review Of Systems  Review of Systems  HENT: Negative.   Eyes: Negative.   Respiratory: Negative.   Cardiovascular: Negative.   Gastrointestinal: Negative.   Endocrine: Negative.   Genitourinary: Negative.    Musculoskeletal: Negative.   Skin: Negative.   Allergic/Immunologic: Negative.   Neurological: Negative.   Hematological: Negative.   Psychiatric/Behavioral: Negative.   All other systems reviewed and are negative.   Objective:   PHYSICAL EXAM:  BP (!) 152/82 (BP Location: Right Arm, Patient Position: Sitting, Cuff Size: Normal)   Pulse (!) 108   Temp 97.8 F (36.6 C) (Temporal)   Ht 5\' 3"  (1.6 m)   Wt 151 lb 6.4 oz (68.7 kg)   SpO2 98%   BMI 26.82 kg/m    Physical Exam Vitals and nursing note reviewed.  Constitutional:      General: She is awake.     Appearance: Normal appearance. She is well-developed, well-groomed and overweight.  HENT:     Head: Normocephalic and atraumatic.     Right Ear: Hearing, tympanic membrane, ear canal and external ear normal.     Left Ear: Hearing, tympanic membrane, ear canal and external ear normal.     Ears:     Weber exam findings: does not lateralize.    Right Rinne: AC > BC.    Left Rinne: AC > BC.    Nose: Nose normal.     Mouth/Throat:     Lips: Pink.     Mouth: Mucous membranes are moist.     Dentition: Abnormal dentition.     Pharynx: Oropharynx is clear. Uvula midline.     Comments:   Eyes:     General: Lids are normal.     Extraocular Movements: Extraocular movements intact.     Conjunctiva/sclera: Conjunctivae normal.     Pupils: Pupils are  equal, round, and reactive to light.  Neck:     Thyroid: No thyroid mass, thyromegaly or thyroid tenderness.     Trachea: Trachea normal.  Cardiovascular:     Rate and Rhythm: Regular rhythm. Tachycardia present.     Pulses: Normal pulses.          Radial pulses are 2+ on the right side and 2+ on the left side.       Dorsalis pedis pulses are 2+ on the right side and 2+ on the left side.       Posterior tibial pulses are 2+ on the right side and 2+ on the left side.     Heart sounds: Normal heart sounds.  Pulmonary:     Effort: Pulmonary effort is normal.     Breath sounds:  Normal breath sounds and air entry.  Abdominal:     General: Abdomen is flat. Bowel sounds are normal.     Palpations: Abdomen is soft.     Tenderness: There is no abdominal tenderness. There is no right CVA tenderness or left CVA tenderness.  Musculoskeletal:        General: Normal range of motion.     Cervical back: Full passive range of motion without pain, normal range of motion and neck supple.     Right lower leg: No edema.     Left lower leg: No edema.     Right foot: Normal range of motion.     Left foot: Normal range of motion.     Comments: Moves all extremities without issue ROM intact throughout  Feet:     Right foot:     Protective Sensation: 5 sites tested. 5 sites sensed.     Skin integrity: Skin integrity normal.     Toenail Condition: Right toenails are abnormally thick. Fungal disease present.    Left foot:     Protective Sensation: 5 sites tested. 5 sites sensed.     Skin integrity: Skin integrity normal.     Toenail Condition: Left toenails are abnormally thick. Fungal disease present.    Comments: Several toes have fungal infection and secondary nail thickening. Lymphadenopathy:     Cervical: No cervical adenopathy.  Skin:    General: Skin is warm and dry.     Capillary Refill: Capillary refill takes less than 2 seconds.  Neurological:     General: No focal deficit present.     Mental Status: She is alert and oriented to person, place, and time. Mental status is at baseline.     Cranial Nerves: Cranial nerves are intact.     Sensory: Sensation is intact.     Motor: Tremor present.     Coordination: Coordination is intact.     Gait: Gait is intact.     Deep Tendon Reflexes: Reflexes are normal and symmetric.  Psychiatric:        Attention and Perception: Attention and perception normal.        Mood and Affect: Mood and affect normal.        Speech: Speech normal.        Behavior: Behavior normal. Behavior is cooperative.        Thought Content: Thought  content normal.        Cognition and Memory: Cognition and memory normal.        Judgment: Judgment normal.    EKG: EKG personally reviewed by me.  Normal sinus rhythm, 98 bpm, nonspecific T wave abnormality, abnormal ECG demonstrated.  Depression Screening  Depression screen [facility] 2/9 01/05/2020 10/05/2019 04/20/2018 02/17/2018 01/05/2018  Decreased Interest 0 0 0 0 0  Down, Depressed, Hopeless 0 0 0 0 0  PHQ - 2 Score 0 0 0 0 0  Altered sleeping 0 - - - -  Tired, decreased energy 0 - - - -  Change in appetite 0 - - - -  Feeling bad or failure about yourself  0 - - - -  Trouble concentrating 0 - - - -  Moving slowly or fidgety/restless 0 - - - -  Suicidal thoughts 0 - - - -  PHQ-9 Score 0 - - - -  Difficult doing work/chores Not difficult at all - - - -      Assessment & Plan:   1. Annual visit for general adult medical examination with abnormal findings   2. Controlled type 2 diabetes mellitus without complication, without long-term current use of insulin (HCC)   3. Mixed hyperlipidemia   4. Visit for screening mammogram   5. Screening for osteoporosis   6. Essential hypertension   7. Encounter for screening colonoscopy   8. Need for vaccination against Streptococcus pneumoniae using pneumococcal conjugate vaccine 13   9. Nonspecific abnormal electrocardiogram (ECG) (EKG)     Tests ordered Orders Placed This Encounter  Procedures  . MM 3D SCREEN BREAST BILATERAL  . DG Bone Density  . Pneumococcal conjugate vaccine 13-valent IM  . Microalbumin / creatinine urine ratio  . CBC  . COMPLETE METABOLIC PANEL WITH GFR  . Lipid panel  . TSH  . Ambulatory referral to Gastroenterology  . Ambulatory referral to Cardiology  . POCT glycosylated hemoglobin (Hb A1C)        Plan: Please see assessment and plan per problem list above.   No orders of the defined types were placed in this encounter.  I have personally reviewed: The patient's medical and social history Their  use of alcohol, tobacco or illicit drugs Their current medications and supplements The patient's functional ability including ADLs,fall risks, home safety risks, cognitive, and hearing and visual impairment Diet and physical activities Evidence for depression or mood disorders  The patient's weight, height, BMI, and visual acuity have been recorded in the chart.  I have made referrals, counseling, and provided education to the patient based on review of the above and I have provided the patient with a written personalized care plan for preventive services.     Freddy Finner, NP   01/06/2020

## 2020-01-05 NOTE — Patient Instructions (Addendum)
I appreciate the opportunity to provide you with care for your health and wellness. Today we discussed: overall health   Follow up: 6 months   Labs-fasting today, urine as well Referrals today: mammogram (mobile), GI for colonoscopy, Bone Scan ordered Cardiology referral made for verification of changes on EKG as we do not have one to compare to.  Vaccine today-sore arm is expected. Take tylenol as needed.  Continue to walk and eat a well balanced diet. :) Have a great Summer   Please continue to practice social distancing to keep you, your family, and our community safe.  If you must go out, please wear a mask and practice good handwashing.  It was a pleasure to see you and I look forward to continuing to work together on your health and well-being. Please do not hesitate to call the office if you need care or have questions about your care.  Have a wonderful day and week. With Gratitude, Tereasa Coop, DNP, AGNP-BC  HEALTH MAINTENANCE RECOMMENDATIONS:  It is recommended that you get at least 30 minutes of aerobic exercise at least 5 days/week (for weight loss, you may need as much as 60-90 minutes). This can be any activity that gets your heart rate up. This can be divided in 10-15 minute intervals if needed, but try and build up your endurance at least once a week.  Weight bearing exercise is also recommended twice weekly.  Eat a healthy diet with lots of vegetables, fruits and fiber.  "Colorful" foods have a lot of vitamins (ie green vegetables, tomatoes, red peppers, etc).  Limit sweet tea, regular sodas and alcoholic beverages, all of which has a lot of calories and sugar.  Up to 1 alcoholic drink daily may be beneficial for women (unless trying to lose weight, watch sugars).  Drink a lot of water.  Calcium recommendations are 1200-1500 mg daily (1500 mg for postmenopausal women or women without ovaries), and vitamin D 1000 IU daily.  This should be obtained from diet and/or  supplements (vitamins), and calcium should not be taken all at once, but in divided doses.  Monthly self breast exams and yearly mammograms for women over the age of 5 is recommended.  Sunscreen of at least SPF 30 should be used on all sun-exposed parts of the skin when outside between the hours of 10 am and 4 pm (not just when at beach or pool, but even with exercise, golf, tennis, and yard work!)  Use a sunscreen that says "broad spectrum" so it covers both UVA and UVB rays, and make sure to reapply every 1-2 hours.  Remember to change the batteries in your smoke detectors when changing your clock times in the spring and fall.  Use your seat belt every time you are in a car, and please drive safely and not be distracted with cell phones and texting while driving.

## 2020-01-06 ENCOUNTER — Ambulatory Visit (INDEPENDENT_AMBULATORY_CARE_PROVIDER_SITE_OTHER): Payer: PPO | Admitting: Ophthalmology

## 2020-01-06 ENCOUNTER — Encounter (INDEPENDENT_AMBULATORY_CARE_PROVIDER_SITE_OTHER): Payer: Self-pay | Admitting: Ophthalmology

## 2020-01-06 DIAGNOSIS — R9431 Abnormal electrocardiogram [ECG] [EKG]: Secondary | ICD-10-CM | POA: Insufficient documentation

## 2020-01-06 DIAGNOSIS — H35033 Hypertensive retinopathy, bilateral: Secondary | ICD-10-CM | POA: Diagnosis not present

## 2020-01-06 DIAGNOSIS — I10 Essential (primary) hypertension: Secondary | ICD-10-CM

## 2020-01-06 DIAGNOSIS — Z23 Encounter for immunization: Secondary | ICD-10-CM | POA: Insufficient documentation

## 2020-01-06 DIAGNOSIS — H3581 Retinal edema: Secondary | ICD-10-CM | POA: Diagnosis not present

## 2020-01-06 DIAGNOSIS — E113313 Type 2 diabetes mellitus with moderate nonproliferative diabetic retinopathy with macular edema, bilateral: Secondary | ICD-10-CM | POA: Diagnosis not present

## 2020-01-06 DIAGNOSIS — Z961 Presence of intraocular lens: Secondary | ICD-10-CM

## 2020-01-06 NOTE — Assessment & Plan Note (Signed)
Carolyn Hunter is encouraged to maintain a well balanced diet that is low in salt. unControlled to borderline control controlled. Nonspecific abnormal T wave changes found on EKG referral to cardiology made for better assessment of this.  Continue Norvasc at this time. Additionally, she is also reminded that exercise is beneficial for heart health and control of  Blood pressure. 30-60 minutes daily is recommended-walking was suggested.

## 2020-01-06 NOTE — Assessment & Plan Note (Signed)
Heart healthy low-fat diet encouraged.  We will get updated labs soon.

## 2020-01-06 NOTE — Assessment & Plan Note (Signed)
Pneumonia vaccine provided today, did receive 23 prior

## 2020-01-06 NOTE — Assessment & Plan Note (Signed)
EKG demonstrated normal sinus rhythm with nonspecific T wave abnormality.  97 bpm.  Referral to cardiology just for establishment as I have not had this patient before nor do I have an EKG for comparison.  Patient appears to be asymptomatic.

## 2020-01-06 NOTE — Assessment & Plan Note (Signed)
Mammogram ordered

## 2020-01-06 NOTE — Assessment & Plan Note (Signed)
Bone scan ordered today.  Will provide treatment as needed.

## 2020-01-06 NOTE — Assessment & Plan Note (Signed)
A1c 7.6.  Would like to see better control.  Encourage diet changes. Will wait to increase Metformin to twice daily to see if she can get her diet and lifestyle changes implemented.  Is on statin therapy. Foot exam to done today in the office. Encouraged to continue following a diabetic heart friendly diet.  And to take medications as directed. Updated labs ordered.

## 2020-01-08 ENCOUNTER — Encounter (INDEPENDENT_AMBULATORY_CARE_PROVIDER_SITE_OTHER): Payer: Self-pay | Admitting: Ophthalmology

## 2020-01-08 MED ORDER — AFLIBERCEPT 2MG/0.05ML IZ SOLN FOR KALEIDOSCOPE
2.0000 mg | INTRAVITREAL | Status: AC | PRN
  Administered 2020-01-08: 2 mg via INTRAVITREAL

## 2020-01-10 DIAGNOSIS — E782 Mixed hyperlipidemia: Secondary | ICD-10-CM | POA: Diagnosis not present

## 2020-01-10 DIAGNOSIS — I10 Essential (primary) hypertension: Secondary | ICD-10-CM | POA: Diagnosis not present

## 2020-01-10 DIAGNOSIS — E119 Type 2 diabetes mellitus without complications: Secondary | ICD-10-CM | POA: Diagnosis not present

## 2020-01-11 ENCOUNTER — Other Ambulatory Visit: Payer: Self-pay | Admitting: Family Medicine

## 2020-01-11 DIAGNOSIS — E1169 Type 2 diabetes mellitus with other specified complication: Secondary | ICD-10-CM

## 2020-01-11 LAB — COMPLETE METABOLIC PANEL WITH GFR
AG Ratio: 1.4 (calc) (ref 1.0–2.5)
ALT: 9 U/L (ref 6–29)
AST: 16 U/L (ref 10–35)
Albumin: 4.2 g/dL (ref 3.6–5.1)
Alkaline phosphatase (APISO): 71 U/L (ref 37–153)
BUN: 13 mg/dL (ref 7–25)
CO2: 33 mmol/L — ABNORMAL HIGH (ref 20–32)
Calcium: 10.1 mg/dL (ref 8.6–10.4)
Chloride: 102 mmol/L (ref 98–110)
Creat: 0.78 mg/dL (ref 0.50–0.99)
GFR, Est African American: 92 mL/min/{1.73_m2} (ref >=60)
GFR, Est Non African American: 79 mL/min/{1.73_m2} (ref >=60)
Globulin: 2.9 g/dL (calc) (ref 1.9–3.7)
Glucose, Bld: 158 mg/dL — ABNORMAL HIGH (ref 65–99)
Potassium: 4.3 mmol/L (ref 3.5–5.3)
Sodium: 141 mmol/L (ref 135–146)
Total Bilirubin: 0.5 mg/dL (ref 0.2–1.2)
Total Protein: 7.1 g/dL (ref 6.1–8.1)

## 2020-01-11 LAB — CBC
HCT: 37.8 % (ref 35.0–45.0)
Hemoglobin: 12.9 g/dL (ref 11.7–15.5)
MCH: 31.7 pg (ref 27.0–33.0)
MCHC: 34.1 g/dL (ref 32.0–36.0)
MCV: 92.9 fL (ref 80.0–100.0)
MPV: 10.8 fL (ref 7.5–12.5)
Platelets: 210 10*3/uL (ref 140–400)
RBC: 4.07 10*6/uL (ref 3.80–5.10)
RDW: 11.6 % (ref 11.0–15.0)
WBC: 5.9 10*3/uL (ref 3.8–10.8)

## 2020-01-11 LAB — MICROALBUMIN / CREATININE URINE RATIO
Creatinine, Urine: 72 mg/dL (ref 20–275)
Microalb Creat Ratio: 7 mcg/mg creat (ref <30)
Microalb, Ur: 0.5 mg/dL

## 2020-01-11 LAB — LIPID PANEL
Cholesterol: 196 mg/dL (ref <200)
HDL: 50 mg/dL (ref >=50)
LDL Cholesterol (Calc): 121 mg/dL (calc) — ABNORMAL HIGH
Non-HDL Cholesterol (Calc): 146 mg/dL (calc) — ABNORMAL HIGH (ref <130)
Total CHOL/HDL Ratio: 3.9 (calc) (ref <5.0)
Triglycerides: 133 mg/dL (ref <150)

## 2020-01-11 LAB — TSH: TSH: 1.36 mIU/L (ref 0.40–4.50)

## 2020-01-11 MED ORDER — METFORMIN HCL 1000 MG PO TABS: 1000.0000 mg | ORAL_TABLET | Freq: Two times a day (BID) | ORAL | 1 refills | Status: DC

## 2020-01-23 ENCOUNTER — Other Ambulatory Visit: Payer: Self-pay

## 2020-01-23 ENCOUNTER — Ambulatory Visit (HOSPITAL_COMMUNITY)
Admission: RE | Admit: 2020-01-23 | Discharge: 2020-01-23 | Disposition: A | Discharge status: Home / Self Care | Payer: PPO | Source: Ambulatory Visit | Attending: Family Medicine | Admitting: Family Medicine

## 2020-01-23 DIAGNOSIS — Z13820 Encounter for screening for osteoporosis: Secondary | ICD-10-CM | POA: Diagnosis not present

## 2020-01-23 DIAGNOSIS — Z780 Asymptomatic menopausal state: Secondary | ICD-10-CM | POA: Insufficient documentation

## 2020-01-23 DIAGNOSIS — M85851 Other specified disorders of bone density and structure, right thigh: Secondary | ICD-10-CM | POA: Insufficient documentation

## 2020-02-03 ENCOUNTER — Other Ambulatory Visit: Payer: Self-pay

## 2020-02-03 ENCOUNTER — Ambulatory Visit
Admission: RE | Admit: 2020-02-03 | Discharge: 2020-02-03 | Disposition: A | Discharge status: Home / Self Care | Payer: PPO | Source: Ambulatory Visit | Attending: Family Medicine | Admitting: Family Medicine

## 2020-02-03 DIAGNOSIS — Z1231 Encounter for screening mammogram for malignant neoplasm of breast: Secondary | ICD-10-CM

## 2020-02-15 ENCOUNTER — Encounter (INDEPENDENT_AMBULATORY_CARE_PROVIDER_SITE_OTHER): Payer: PPO | Admitting: Ophthalmology

## 2020-02-16 ENCOUNTER — Encounter: Payer: Self-pay | Admitting: Cardiology

## 2020-02-16 ENCOUNTER — Other Ambulatory Visit: Payer: Self-pay

## 2020-02-16 ENCOUNTER — Ambulatory Visit (INDEPENDENT_AMBULATORY_CARE_PROVIDER_SITE_OTHER): Payer: PPO | Admitting: Cardiology

## 2020-02-16 VITALS — BP 160/84 | HR 98 | Ht 63.0 in | Wt 149.0 lb

## 2020-02-16 DIAGNOSIS — R9431 Abnormal electrocardiogram [ECG] [EKG]: Secondary | ICD-10-CM | POA: Diagnosis not present

## 2020-02-16 NOTE — Progress Notes (Signed)
Clinical Summary Ms. Cordial is a 69 y.o.female seen as a new consult, referred by NP [facility] for abnormal EKG.   1. Abnormal EKG - noted at 01/05/20 pcp visit - EKG showed SR, nonspecific ST/T changes. No old for comparison  - no chest pains. No SOB or DOE - does 30 min cardio exercise without troubles. Walks 3-4 days a week, up to 1.5 to 2 miles  2. HTN - she reprots white coat HTN - home bp's 120s/60s  3. Hyperlipidemia - per primary      Past Medical History:  Diagnosis Date  . Cataract    OS  . Diabetes mellitus without complication (HCC)   . Diabetic retinopathy (HCC)    NPDR OU  . Hyperlipidemia   . Hypertension   . Hypertensive retinopathy    OU     Allergies  Allergen Reactions  . Ace Inhibitors Cough     Current Outpatient Medications  Medication Sig Dispense Refill  . amLODipine (NORVASC) 5 MG tablet Take 1 tablet (5 mg total) by mouth daily. 90 tablet 0  . aspirin EC 81 MG tablet Take 1 tablet (81 mg total) by mouth daily. (Patient taking differently: Take 81 mg by mouth at bedtime. )    . Bromfenac Sodium 0.09 % SOLN Place 1 drop into both eyes 2 (two) times daily. 4 mL 3  . Liniments (SALONPAS PAIN RELIEF PATCH EX) Place 1 patch onto the skin daily as needed (pain.).    Marland Kitchen metFORMIN (GLUCOPHAGE) 1000 MG tablet Take 1 tablet (1,000 mg total) by mouth 2 (two) times daily with a meal. 180 tablet 1  . prednisoLONE acetate (PRED FORTE) 1 % ophthalmic suspension Place 1 drop into both eyes 4 (four) times daily. 15 mL 0  . PROLENSA 0.07 % SOLN INSTILL 1 DROP INTO BOTH EYES TWICE A DAY 3 mL 5  . simvastatin (ZOCOR) 40 MG tablet Take 1 tablet (40 mg total) by mouth at bedtime. 90 tablet 1  . VITAMIN D PO Take 50 mcg by mouth daily.     No current facility-administered medications for this visit.     Past Surgical History:  Procedure Laterality Date  . CATARACT EXTRACTION W/PHACO Right 10/18/2018   Procedure: CATARACT EXTRACTION PHACO AND INTRAOCULAR  LENS PLACEMENT (IOC);  Surgeon: Fabio Pierce, MD;  Location: [facility];  Service: Ophthalmology;  Laterality: Right;  CDE: 8.11  . CATARACT EXTRACTION W/PHACO Left 01/17/2019   Procedure: CATARACT EXTRACTION PHACO AND INTRAOCULAR LENS PLACEMENT (IOC);  Surgeon: Fabio Pierce, MD;  Location: [facility];  Service: Ophthalmology;  Laterality: Left;  CDE: 6.38  . CESAREAN SECTION     1988  . EYE SURGERY Right    Cataract extraction OD     Allergies  Allergen Reactions  . Ace Inhibitors Cough      Family History  Problem Relation Age of Onset  . Cancer Mother        breast and colon cancer  . Breast cancer Mother   . Diabetes Brother   . Diabetes Son   . Asthma Father        COPD  . Diabetes Son      Social History Ms. Donahey reports that she has never smoked. She has never used smokeless tobacco. Ms. Tino reports no history of alcohol use.   Review of Systems CONSTITUTIONAL: No weight loss, fever, chills, weakness or fatigue.  HEENT: Eyes: No visual loss, blurred vision, double vision or yellow sclerae.No hearing loss,  sneezing, congestion, runny nose or sore throat.  SKIN: No rash or itching.  CARDIOVASCULAR: per hpi RESPIRATORY: No shortness of breath, cough or sputum.  GASTROINTESTINAL: No anorexia, nausea, vomiting or diarrhea. No abdominal pain or blood.  GENITOURINARY: No burning on urination, no polyuria NEUROLOGICAL: No headache, dizziness, syncope, paralysis, ataxia, numbness or tingling in the extremities. No change in bowel or bladder control.  MUSCULOSKELETAL: No muscle, back pain, joint pain or stiffness.  LYMPHATICS: No enlarged nodes. No history of splenectomy.  PSYCHIATRIC: No history of depression or anxiety.  ENDOCRINOLOGIC: No reports of sweating, cold or heat intolerance. No polyuria or polydipsia.  Marland Kitchen   Physical Examination Today's Vitals   02/16/20 0858  BP: (!) 160/84  Pulse: 98  SpO2: 98%  Weight: 149 lb (67.6 kg)  Height: 5\' 3"  (1.6 m)    Body mass index is 26.39 kg/m.  Gen: resting comfortably, no acute distress HEENT: no scleral icterus, pupils equal round and reactive, no palptable cervical adenopathy,  CV: RRR, no m/r/g, no jvd Resp: Clear to auscultation bilaterally GI: abdomen is soft, non-tender, non-distended, normal bowel sounds, no hepatosplenomegaly MSK: extremities are warm, no edema.  Skin: warm, no rash Neuro:  no focal deficits Psych: appropriate affect      Assessment and Plan  1. Abormal EKG - nonspecific ST/T changes on baseline EKG - no clinical cardiopulmonary symptoms, remains physically active without any symptoms - with just nonspecific EKG findings and absence of symptoms would not plan on any further cardiac testing - continue risk factor modification per pcp   F/u as needed   , M.D

## 2020-02-16 NOTE — Patient Instructions (Signed)
Your physician recommends that you schedule a follow-up appointment in: AS NEEDED WITH DR BRANCH  Your physician recommends that you continue on your current medications as directed. Please refer to the Current Medication list given to you today.  Thank you for choosing [facility]!!    

## 2020-03-09 NOTE — Progress Notes (Signed)
[facility] - Clinic Note  03/14/2020     CHIEF COMPLAINT Patient presents for Retina Follow Up   HISTORY OF PRESENT ILLNESS: Carolyn Hunter is a 69 y.o. female who presents to the clinic today for:   HPI    Retina Follow Up    Patient presents with  Diabetic Retinopathy.  In both eyes.  This started weeks ago.  Severity is moderate.  Duration of weeks.  Since onset it is stable.  I, the attending physician,  performed the HPI with the patient and updated documentation appropriately.          Comments    Pt states vision is stable in both eyes.  Patient denies eye pain or discomfort and denies any new or worsening floaters or fol OU.       Last edited by Rennis Chris, MD on 03/14/2020  2:21 PM. (History)    pt states  Referring physician: Freddy Finner, NP [address] [place],  [place] [address]  HISTORICAL INFORzMATION:   Selected notes from the MEDICAL RECORD NUMBER Referral from Dr. Fabienne Bruns for DM exam   CURRENT MEDICATIONS: Current Outpatient Medications (Ophthalmic Drugs)  Medication Sig   Bromfenac Sodium (PROLENSA) 0.07 % SOLN Place 1 drop into both eyes in the morning, at noon, in the evening, and at bedtime.   Bromfenac Sodium 0.09 % SOLN Place 1 drop into both eyes 2 (two) times daily.   prednisoLONE acetate (PRED FORTE) 1 % ophthalmic suspension Place 1 drop into both eyes 4 (four) times daily.   No current facility-administered medications for this visit. (Ophthalmic Drugs)   Current Outpatient Medications (Other)  Medication Sig   amLODipine (NORVASC) 5 MG tablet Take 1 tablet (5 mg total) by mouth daily.   aspirin EC 81 MG tablet Take 1 tablet (81 mg total) by mouth daily. (Patient taking differently: Take 81 mg by mouth at bedtime. )   Liniments (SALONPAS PAIN RELIEF PATCH EX) Place 1 patch onto the skin daily as needed (pain.).   metFORMIN (GLUCOPHAGE) 1000 MG tablet Take 1 tablet (1,000 mg total) by mouth 2 (two) times daily  with a meal.   simvastatin (ZOCOR) 40 MG tablet Take 1 tablet (40 mg total) by mouth at bedtime.   VITAMIN D PO Take 50 mcg by mouth daily.   No current facility-administered medications for this visit. (Other)      REVIEW OF SYSTEMS: ROS    Positive for: Neurological, Endocrine, Eyes   Negative for: Constitutional, Gastrointestinal, Skin, Genitourinary, Musculoskeletal, HENT, Cardiovascular, Respiratory, Psychiatric, Allergic/Imm, Heme/Lymph   Last edited by Corrinne Eagle on 03/14/2020  1:57 PM. (History)       ALLERGIES Allergies  Allergen Reactions   Ace Inhibitors Cough    PAST MEDICAL HISTORY Past Medical History:  Diagnosis Date   Cataract    OS   Diabetes mellitus without complication (HCC)    Diabetic retinopathy (HCC)    NPDR OU   Hyperlipidemia    Hypertension    Hypertensive retinopathy    OU   Past Surgical History:  Procedure Laterality Date   CATARACT EXTRACTION W/PHACO Right 10/18/2018   Procedure: CATARACT EXTRACTION PHACO AND INTRAOCULAR LENS PLACEMENT (IOC);  Surgeon: Fabio Pierce, MD;  Location: [facility];  Service: Ophthalmology;  Laterality: Right;  CDE: 8.11   CATARACT EXTRACTION W/PHACO Left 01/17/2019   Procedure: CATARACT EXTRACTION PHACO AND INTRAOCULAR LENS PLACEMENT (IOC);  Surgeon: Fabio Pierce, MD;  Location: [facility];  Service: Ophthalmology;  Laterality: Left;  CDE: 6.38   CESAREAN SECTION     1988   EYE SURGERY Right    Cataract extraction OD    FAMILY HISTORY Family History  Problem Relation Age of Onset   Cancer Mother        breast and colon cancer   Breast cancer Mother    Diabetes Brother    Diabetes Son    Asthma Father        COPD   Diabetes Son     SOCIAL HISTORY Social History   Tobacco Use   Smoking status: Never Smoker   Smokeless tobacco: Never Used  Building services engineer Use: Never used  Substance Use Topics   Alcohol use: No   Drug use: No         OPHTHALMIC EXAM:  Base  Eye Exam    Visual Acuity (Snellen - Linear)      Right Left   Dist [place] 20/20 -2 20/20       Tonometry (Tonopen, 2:02 PM)      Right Left   Pressure 15 15       Pupils      Dark Light Shape React APD   Right 3 2 Round Brisk 0   Left 3 2 Round Brisk 0       Visual Fields      Left Right    Full Full       Extraocular Movement      Right Left    Full Full       Neuro/Psych    Oriented x3: Yes   Mood/Affect: Normal       Dilation    Both eyes: 1.0% Mydriacyl, 2.5% Phenylephrine @ 2:02 PM        Slit Lamp and Fundus Exam    Slit Lamp Exam      Right Left   Lids/Lashes Dermatochalasis - upper lid, Meibomian gland dysfunction Dermatochalasis - upper lid, Meibomian gland dysfunction   Conjunctiva/Sclera White and quiet White and quiet   Cornea Mild Arcus, Well healed temporal cataract wounds Mild Arcus, EBMD, 2+ Punctate epithelial erosions, Well healed temporal cataract wounds   Anterior Chamber Deep, 1+cell/pigment Deep and quiet   Iris Round and dilated, No NVI, PPM nasally Round and dilated, No NVI   Lens PC IOL in good position PC IOL in good position, trace PCO   Vitreous Vitreous syneresis, residual triamcinolone IT Vitreous syneresis       Fundus Exam      Right Left   Disc mild pallor, Sharp rim mild pallor, Sharp rim   C/D Ratio 0.3 0.4   Macula blunted foveal reflex, persistent central edema/ IRF--slightly improved, focal exudate, scattered MA, epiretinal membrane blunted foveal reflex, epiretinal membrane, scattered MA, interval improvement in central cystic changes   Vessels Vascular attenuation, Tortuous, AV crossing changes Vascular attenuation, Tortuous, AV crossing changes   Periphery Attached, scattered MAs, DBH, exudates inferior to disc - resolved attached, scattered DBH greatest nasally and IN, focal exudates nasal midzone          IMAGING AND PROCEDURES  Imaging and Procedures for 12/10/17  OCT, Retina - OU - Both Eyes       Right  Eye Quality was good. Central Foveal Thickness: 472. Progression has worsened. Findings include abnormal foveal contour, intraretinal hyper-reflective material, intraretinal fluid, no SRF, outer retinal atrophy (interval increase in IRF -- likely combination of DME/CME; +IRHM; interval release of central  VMT -- now partial PVD).   Left Eye Quality was good. Central Foveal Thickness: 379. Progression has been stable. Findings include abnormal foveal contour, intraretinal fluid, intraretinal hyper-reflective material, vitreomacular adhesion , epiretinal membrane (Mild interval increase in IRF/cystic changes; persistent ERM/VMA).   Notes Images taken, stored on drive  Diagnosis / Impression:  DME OU (OD>OS) OD: interval increase in IRF -- likely combination of DME/CME; +IRHM; interval release of central VMT -- now partial PVD OS: Mild interval increase in IRF/cystic changes; persistent ERM/VMA  Clinical management:  See below  Abbreviations: NFP - Normal foveal profile. CME - cystoid macular edema. PED - pigment epithelial detachment. IRF - intraretinal fluid. SRF - subretinal fluid. EZ - ellipsoid zone. ERM - epiretinal membrane. ORA - outer retinal atrophy. ORT - outer retinal tubulation. SRHM - subretinal hyper-reflective material         Intravitreal Injection, Pharmacologic Agent - OD - Right Eye       Time Out 03/14/2020. 2:14 PM. Confirmed correct patient, procedure, site, and patient consented.   Anesthesia Topical anesthesia was used. Anesthetic medications included Lidocaine 2%, Proparacaine 0.5%.   Procedure Preparation included 5% betadine to ocular surface, eyelid speculum. A (32g) needle was used.   Injection:  2 mg aflibercept Gretta Cool(EYLEA) SOLN   NDC: [id number], Lot: [id number], Expiration date: 06/10/2020   Route: Intravitreal, Site: Right Eye, Waste: 0.05 mL  Post-op Post injection exam found visual acuity of at least counting fingers. The patient tolerated the  procedure well. There were no complications. The patient received written and verbal post procedure care education. Post injection medications were not given.                 ASSESSMENT/PLAN:    ICD-10-CM   1. Moderate nonproliferative diabetic retinopathy of both eyes with macular edema associated with type 2 diabetes mellitus (HCC)  [id number] Intravitreal Injection, Pharmacologic Agent - OD - Right Eye    aflibercept (EYLEA) SOLN 2 mg    CANCELED: Intravitreal Injection, Pharmacologic Agent - OS - Left Eye  2. Retinal edema  H35.81 OCT, Retina - OU - Both Eyes  3. Epiretinal membrane (ERM) of left eye  H35.372   4. Essential hypertension  I10   5. Hypertensive retinopathy of both eyes  H35.033   6. Pseudophakia of both eyes  Z96.1     1,2. Moderate nonproliferative diabetic retinopathy, both eyes  - Diabetic macular edema, both eyes -- relatively mild  - S/P IVA OD #1 (12.17.18), #2 (01.28.19), #3 (02.27.19), #4 (04.03.19), #5 (05.01.19), #6 (05.27.20), #7 (07.08.20)  - in reviewing her case, there was minimal response to IVA in OCT or BCVA  - S/P IVTA OD #1 (05.29.19), #2 (07.24.19), #3 (09.04.19), #4 (12.04.19), #5 (02.26.20), # 6 (08.05.20), # 7(09.30.20), #8 (12.23.20)  - slightly better response with IVTA  - S/P focal laser OS (09.02.20)  - s/p IVE OD #1 (3.3.21), #2 (03.31.21), #3 (04.28.21), #4 (05.28.21)  - s/p IVE OS #1 (04.28.21), #2 (05.28.21)  - today, f/u delayed to 9 wks from 5 wks  - OCT shows interval increase in IRF and release of PVD OD; OS stable  - BCVA stable at 20/20 OU  - recommend IVE OD #5 today, 08.04.21; will hold IVE OS today  - pt wishes to proceed  - RBA of procedure discussed, questions answered  - informed consent obtained and signed  - see procedure note  - cont Bromfenac QID OU  - continue PF QID OU  - f/u  5-6 weeks -- DFE/OCT possible injection(s)  3. Epiretinal membrane, left eye  - The natural history, anatomy, potential for loss of  vision, and treatment options including vitrectomy techniques and the complications of endophthalmitis, retinal detachment, vitreous hemorrhage, cataract progression and permanent vision loss discussed with the patient. - mild ERM with VMA/VMT component - BCVA 20/20 - asymptomatic, no metamorphopsia - no indication for surgery at this time - monitor for now  4,5. Hypertensive retinopathy OU  - discussed importance of tight BP control  - monitor  6. Pseudophakia OU  - s/p CE/IOL OU (Dr. June Leap, March/June 2020)  - beautiful surgeries, doing well  - monitor   Ophthalmic Meds Ordered this visit:  Meds ordered this encounter  Medications   Bromfenac Sodium (PROLENSA) 0.07 % SOLN    Sig: Place 1 drop into both eyes in the morning, at noon, in the evening, and at bedtime.    Dispense:  3 mL    Refill:  5   prednisoLONE acetate (PRED FORTE) 1 % ophthalmic suspension    Sig: Place 1 drop into both eyes 4 (four) times daily.    Dispense:  15 mL    Refill:  1   aflibercept (EYLEA) SOLN 2 mg       Return for 4-6 wks - f/u DME, Dilated Exam, OCT, Possible Injxn.  There are no Patient Instructions on file for this visit.   Explained the diagnoses, plan, and follow up with the patient and they expressed understanding.  Patient expressed understanding of the importance of proper follow up care.   This document serves as a record of services personally performed by Karie Chimera, MD, PhD. It was created on their behalf by Annalee Genta, COMT. The creation of this record is the provider's dictation and/or activities during the visit.  Electronically signed by: Annalee Genta, COMT 03/14/20 4:40 PM  This document serves as a record of services personally performed by Karie Chimera, MD, PhD. It was created on their behalf by Glee Arvin. Manson Passey, OA an ophthalmic technician. The creation of this record is the provider's dictation and/or activities during the visit.    Electronically signed  by: Glee Arvin. Manson Passey, [place] 08.04.2021 4:40 PM   Karie Chimera, M.D., Ph.D. Diseases & Surgery of the Retina and Vitreous [facility]  I have reviewed the above documentation for accuracy and completeness, and I agree with the above. Karie Chimera, M.D., Ph.D. 03/14/20 4:40 PM   Abbreviations: M myopia (nearsighted); A astigmatism; H hyperopia (farsighted); P presbyopia; Mrx spectacle prescription;  CTL contact lenses; OD right eye; OS left eye; OU both eyes  XT exotropia; ET esotropia; PEK punctate epithelial keratitis; PEE punctate epithelial erosions; DES dry eye syndrome; MGD meibomian gland dysfunction; ATs artificial tears; PFAT's preservative free artificial tears; NSC nuclear sclerotic cataract; PSC posterior subcapsular cataract; ERM epi-retinal membrane; PVD posterior vitreous detachment; RD retinal detachment; DM diabetes mellitus; DR diabetic retinopathy; NPDR non-proliferative diabetic retinopathy; PDR proliferative diabetic retinopathy; CSME clinically significant macular edema; DME diabetic macular edema; dbh dot blot hemorrhages; CWS cotton wool spot; POAG primary open angle glaucoma; C/D cup-to-disc ratio; HVF humphrey visual field; GVF goldmann visual field; OCT optical coherence tomography; IOP intraocular pressure; BRVO Branch retinal vein occlusion; CRVO central retinal vein occlusion; CRAO central retinal artery occlusion; BRAO branch retinal artery occlusion; RT retinal tear; SB scleral buckle; PPV pars plana vitrectomy; VH Vitreous hemorrhage; PRP panretinal laser photocoagulation; IVK intravitreal kenalog; VMT vitreomacular traction; MH  Macular hole;  NVD neovascularization of the disc; NVE neovascularization elsewhere; AREDS age related eye disease study; ARMD age related macular degeneration; POAG primary open angle glaucoma; EBMD epithelial/anterior basement membrane dystrophy; ACIOL anterior chamber intraocular lens; IOL intraocular lens; PCIOL posterior  chamber intraocular lens; Phaco/IOL phacoemulsification with intraocular lens placement; PRK photorefractive keratectomy; LASIK laser assisted in situ keratomileusis; HTN hypertension; DM diabetes mellitus; COPD chronic obstructive pulmonary disease

## 2020-03-14 ENCOUNTER — Ambulatory Visit (INDEPENDENT_AMBULATORY_CARE_PROVIDER_SITE_OTHER): Payer: PPO | Admitting: Ophthalmology

## 2020-03-14 ENCOUNTER — Encounter (INDEPENDENT_AMBULATORY_CARE_PROVIDER_SITE_OTHER): Payer: Self-pay | Admitting: Ophthalmology

## 2020-03-14 ENCOUNTER — Other Ambulatory Visit: Payer: Self-pay

## 2020-03-14 DIAGNOSIS — Z961 Presence of intraocular lens: Secondary | ICD-10-CM | POA: Diagnosis not present

## 2020-03-14 DIAGNOSIS — I10 Essential (primary) hypertension: Secondary | ICD-10-CM

## 2020-03-14 DIAGNOSIS — E113313 Type 2 diabetes mellitus with moderate nonproliferative diabetic retinopathy with macular edema, bilateral: Secondary | ICD-10-CM

## 2020-03-14 DIAGNOSIS — H35033 Hypertensive retinopathy, bilateral: Secondary | ICD-10-CM | POA: Diagnosis not present

## 2020-03-14 DIAGNOSIS — H3581 Retinal edema: Secondary | ICD-10-CM | POA: Diagnosis not present

## 2020-03-14 DIAGNOSIS — H35372 Puckering of macula, left eye: Secondary | ICD-10-CM

## 2020-03-14 MED ORDER — AFLIBERCEPT 2MG/0.05ML IZ SOLN FOR KALEIDOSCOPE
2.0000 mg | INTRAVITREAL | Status: AC | PRN
  Administered 2020-03-14: 2 mg via INTRAVITREAL

## 2020-03-14 MED ORDER — PROLENSA 0.07 % OP SOLN: 1.0000 [drp] | Freq: Four times a day (QID) | OPHTHALMIC | 5 refills | Status: DC

## 2020-03-14 MED ORDER — PREDNISOLONE ACETATE 1 % OP SUSP: 1.0000 [drp] | Freq: Four times a day (QID) | OPHTHALMIC | 1 refills | Status: DC

## 2020-03-19 ENCOUNTER — Other Ambulatory Visit: Payer: Self-pay

## 2020-03-19 MED ORDER — AMLODIPINE BESYLATE 5 MG PO TABS: 5.0000 mg | ORAL_TABLET | Freq: Every day | ORAL | 0 refills | Status: DC

## 2020-03-29 ENCOUNTER — Encounter (INDEPENDENT_AMBULATORY_CARE_PROVIDER_SITE_OTHER): Payer: Self-pay | Admitting: *Deleted

## 2020-03-29 ENCOUNTER — Telehealth (INDEPENDENT_AMBULATORY_CARE_PROVIDER_SITE_OTHER): Payer: Self-pay | Admitting: *Deleted

## 2020-03-29 ENCOUNTER — Other Ambulatory Visit (INDEPENDENT_AMBULATORY_CARE_PROVIDER_SITE_OTHER): Payer: Self-pay | Admitting: *Deleted

## 2020-03-29 NOTE — Telephone Encounter (Signed)
Patient needs Plenvu (copay card) ° °

## 2020-03-30 MED ORDER — PLENVU 140 G PO SOLR: 1.0000 | Freq: Once | ORAL | 0 refills | Status: AC

## 2020-04-02 ENCOUNTER — Telehealth (INDEPENDENT_AMBULATORY_CARE_PROVIDER_SITE_OTHER): Payer: Self-pay | Admitting: *Deleted

## 2020-04-02 NOTE — Telephone Encounter (Signed)
Referring MD/PCP: mills   Procedure: tcs room 3  Reason/Indication:  Hx polyps, fam hx colon ca  Has patient had this procedure before?  Yes, 2015  If so, when, by whom and where?    Is there a family history of colon cancer?  Yes, mother  Who?  What age when diagnosed?    Is patient diabetic?   yes      Does patient have prosthetic heart valve or mechanical valve?  no  Do you have a pacemaker/defibrillator?  no  Has patient ever had endocarditis/atrial fibrillation? no  Does patient use oxygen? no  Has patient had joint replacement within last 12 months?  no  Is patient constipated or do they take laxatives? no  Does patient have a history of alcohol/drug use?  no  Is patient on blood thinner such as Coumadin, Plavix and/or Aspirin? yes  Medications: asa 81 mg daily, simvastatin 40 mg daily, metformin 1000 mg bid, amlodipine 5 mg daily  Allergies: ace inhibitors  Medication Adjustment per Dr Rehman/Dr Levon Hedger hold metformin evening before and morning of  Procedure date & time: 05/04/20

## 2020-04-02 NOTE — Telephone Encounter (Signed)
Ok to schedule.  Thanks,  Shardai Star Castaneda Mayorga, MD Gastroenterology and Hepatology [facility]  

## 2020-04-10 ENCOUNTER — Other Ambulatory Visit: Payer: Self-pay | Admitting: *Deleted

## 2020-04-10 MED ORDER — AMLODIPINE BESYLATE 5 MG PO TABS
5.0000 mg | ORAL_TABLET | Freq: Every day | ORAL | 0 refills | Status: DC
Start: 2020-04-10 — End: 2020-12-06

## 2020-04-17 NOTE — Progress Notes (Signed)
[facility] - Clinic Note  04/18/2020     CHIEF COMPLAINT Patient presents for Retina Follow Up   HISTORY OF PRESENT ILLNESS: Carolyn Hunter is a 69 y.o. female who presents to the clinic today for:   HPI    Retina Follow Up    Patient presents with  Diabetic Retinopathy.  In both eyes.  This started months ago.  Severity is mild.  Duration of 5 weeks.  Since onset it is stable.  I, the attending physician,  performed the HPI with the patient and updated documentation appropriately.          Comments    69 y/o female pt here for 5 wk f/u for NPDR w/DME OU.  No change in [place] OU.  Denies pain, FOL, floaters.  Prolensa QID OD, Pred BID OD.  BS 120 this a.m.  A1C in the 7s 5-6 mos ago.       Last edited by Rennis Chris, MD on 04/18/2020  5:03 PM. (History)    pt states  Referring physician: Daisy Lazar, DO [address] [place],  [place] [address]  HISTORICAL INFORzMATION:   Selected notes from the MEDICAL RECORD NUMBER Referral from Dr. Fabienne Bruns for DM exam   CURRENT MEDICATIONS: Current Outpatient Medications (Ophthalmic Drugs)  Medication Sig  . Bromfenac Sodium (PROLENSA) 0.07 % SOLN Place 1 drop into both eyes in the morning, at noon, in the evening, and at bedtime.  . prednisoLONE acetate (PRED FORTE) 1 % ophthalmic suspension Place 1 drop into both eyes 4 (four) times daily.  . Bromfenac Sodium 0.09 % SOLN Place 1 drop into both eyes 2 (two) times daily. (Patient not taking: Reported on 04/18/2020)   No current facility-administered medications for this visit. (Ophthalmic Drugs)   Current Outpatient Medications (Other)  Medication Sig  . amLODipine (NORVASC) 5 MG tablet Take 1 tablet (5 mg total) by mouth daily.  Marland Kitchen aspirin EC 81 MG tablet Take 1 tablet (81 mg total) by mouth daily. (Patient taking differently: Take 81 mg by mouth at bedtime. )  . Liniments (SALONPAS PAIN RELIEF PATCH EX) Place 1 patch onto the skin daily as needed (pain.).  Marland Kitchen  metFORMIN (GLUCOPHAGE) 1000 MG tablet Take 1 tablet (1,000 mg total) by mouth 2 (two) times daily with a meal.  . simvastatin (ZOCOR) 40 MG tablet Take 1 tablet (40 mg total) by mouth at bedtime.  Marland Kitchen VITAMIN D PO Take 50 mcg by mouth daily.  . metFORMIN (GLUCOPHAGE) 500 MG tablet Take 500 mg by mouth 2 (two) times daily. (Patient not taking: Reported on 04/18/2020)   No current facility-administered medications for this visit. (Other)      REVIEW OF SYSTEMS: ROS    Positive for: Neurological, Endocrine, Eyes   Negative for: Constitutional, Gastrointestinal, Skin, Genitourinary, Musculoskeletal, HENT, Cardiovascular, Respiratory, Psychiatric, Allergic/Imm, Heme/Lymph   Last edited by Celine Mans, COA on 04/18/2020  1:23 PM. (History)       ALLERGIES Allergies  Allergen Reactions  . Ace Inhibitors Cough    PAST MEDICAL HISTORY Past Medical History:  Diagnosis Date  . Cataract    OS  . Diabetes mellitus without complication (HCC)   . Diabetic retinopathy (HCC)    NPDR OU  . Hyperlipidemia   . Hypertension   . Hypertensive retinopathy    OU   Past Surgical History:  Procedure Laterality Date  . CATARACT EXTRACTION W/PHACO Right 10/18/2018   Procedure: CATARACT EXTRACTION PHACO AND INTRAOCULAR LENS  PLACEMENT (IOC);  Surgeon: Fabio PierceWrzosek, James, MD;  Location: [facility];  Service: Ophthalmology;  Laterality: Right;  CDE: 8.11  . CATARACT EXTRACTION W/PHACO Left 01/17/2019   Procedure: CATARACT EXTRACTION PHACO AND INTRAOCULAR LENS PLACEMENT (IOC);  Surgeon: Fabio PierceWrzosek, James, MD;  Location: [facility];  Service: Ophthalmology;  Laterality: Left;  CDE: 6.38  . CESAREAN SECTION     1988  . EYE SURGERY Right    Cataract extraction OD    FAMILY HISTORY Family History  Problem Relation Age of Onset  . Cancer Mother        breast and colon cancer  . Breast cancer Mother   . Diabetes Brother   . Diabetes Son   . Asthma Father        COPD  . Diabetes Son     SOCIAL HISTORY Social  History   Tobacco Use  . Smoking status: Never Smoker  . Smokeless tobacco: Never Used  Vaping Use  . Vaping Use: Never used  Substance Use Topics  . Alcohol use: No  . Drug use: No         OPHTHALMIC EXAM:  Base Eye Exam    Visual Acuity (Snellen - Linear)      Right Left   Dist [place] 20/20 - 20/20 -       Tonometry (Tonopen, 1:26 PM)      Right Left   Pressure 14 15       Pupils      Dark Light Shape React APD   Right 3 2 Round Brisk None   Left 3 2 Round Brisk None       Visual Fields (Counting fingers)      Left Right    Full Full       Extraocular Movement      Right Left    Full, Ortho Full, Ortho       Neuro/Psych    Oriented x3: Yes   Mood/Affect: Normal       Dilation    Both eyes: 1.0% Mydriacyl, 2.5% Phenylephrine @ 1:26 PM        Slit Lamp and Fundus Exam    Slit Lamp Exam      Right Left   Lids/Lashes Dermatochalasis - upper lid, Meibomian gland dysfunction Dermatochalasis - upper lid, Meibomian gland dysfunction   Conjunctiva/Sclera White and quiet White and quiet   Cornea Mild Arcus, Well healed temporal cataract wounds Mild Arcus, EBMD, 2+ Punctate epithelial erosions, Well healed temporal cataract wounds   Anterior Chamber Deep, 1+cell/pigment Deep and quiet   Iris Round and dilated, No NVI, PPM nasally Round and dilated, No NVI   Lens PC IOL in good position PC IOL in good position, trace PCO   Vitreous Vitreous syneresis, residual triamcinolone IT Vitreous syneresis       Fundus Exam      Right Left   Disc mild pallor, Sharp rim mild pallor, Sharp rim   C/D Ratio 0.3 0.4   Macula good foveal reflex, interval improvement in central edema, mild, focal exudate temporal macula, epiretinal membrane blunted foveal reflex, epiretinal membrane, scattered MA, trace cystic changes -- persistent    Vessels Vascular attenuation, Tortuous, AV crossing changes Vascular attenuation, Tortuous, AV crossing changes   Periphery Attached, scattered  MAs, DBH, exudates inferior to disc - resolved attached, scattered DBH greatest nasally and IN, focal exudates nasal midzone          IMAGING AND PROCEDURES  Imaging and Procedures for 12/10/17  OCT, Retina - OU - Both Eyes       Right Eye Quality was good. Central Foveal Thickness: 320. Progression has improved. Findings include abnormal foveal contour, intraretinal hyper-reflective material, intraretinal fluid, no SRF, outer retinal atrophy (interval improvement in IRF/IRHM/foveal contour -- likely combination of DME/CME; partial PVD).   Left Eye Quality was good. Central Foveal Thickness: 377. Progression has been stable. Findings include abnormal foveal contour, intraretinal fluid, intraretinal hyper-reflective material, vitreomacular adhesion , epiretinal membrane (Trace increase in cystic changes temporal macula).   Notes **Images taken, stored on drive**  Diagnosis / Impression:  DME OU (OD>OS) OD: interval improvement in IRF/IRHM/foveal contour -- likely combination of DME/CME; partial PVD OS: Trace increase in cystic changes temporal macula  Clinical management:  See below  Abbreviations: NFP - Normal foveal profile. CME - cystoid macular edema. PED - pigment epithelial detachment. IRF - intraretinal fluid. SRF - subretinal fluid. EZ - ellipsoid zone. ERM - epiretinal membrane. ORA - outer retinal atrophy. ORT - outer retinal tubulation. SRHM - subretinal hyper-reflective material         Intravitreal Injection, Pharmacologic Agent - OD - Right Eye       Time Out 04/18/2020. 1:49 PM. Confirmed correct patient, procedure, site, and patient consented.   Anesthesia Topical anesthesia was used. Anesthetic medications included Lidocaine 2%, Proparacaine 0.5%.   Procedure Preparation included 5% betadine to ocular surface, eyelid speculum. A (32g) needle was used.   Injection:  2 mg aflibercept Gretta Cool) SOLN   NDC: L6038910, Lot: [id number], Expiration date:  07/10/2020   Route: Intravitreal, Site: Right Eye, Waste: 0.05 mL  Post-op Post injection exam found visual acuity of at least counting fingers. The patient tolerated the procedure well. There were no complications. The patient received written and verbal post procedure care education. Post injection medications were not given.                 ASSESSMENT/PLAN:    ICD-10-CM   1. 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 Retinal edema  H35.81 OCT, Retina - OU - Both Eyes  3. Epiretinal membrane (ERM) of left eye  H35.372   4. Essential hypertension  I10   5. Hypertensive retinopathy of both eyes  H35.033   6. Pseudophakia of right eye  Z96.1     1,2. Moderate nonproliferative diabetic retinopathy, both eyes  - Diabetic macular edema, both eyes -- relatively mild  - S/P IVA OD #1 (12.17.18), #2 (01.28.19), #3 (02.27.19), #4 (04.03.19), #5 (05.01.19), #6 (05.27.20), #7 (07.08.20)  - in reviewing her case, there was minimal response to IVA in OCT or BCVA  - S/P IVTA OD #1 (05.29.19), #2 (07.24.19), #3 (09.04.19), #4 (12.04.19), #5 (02.26.20), # 6 (08.05.20), # 7(09.30.20), #8 (12.23.20)  - slightly better response with IVTA  - S/P focal laser OS (09.02.20)  - s/p IVE OD #1 (03.03.21), #2 (03.31.21), #3 (04.28.21), #4 (05.28.21), #5 (08.04.21)  - s/p IVE OS #1 (04.28.21), #2 (05.28.21)  - OCT shows interval improvement in IRF OD; OS stable  - BCVA stable at 20/20 OU  - recommend IVE OD #6 today, 09.08.21; will hold IVE OS today  - pt wishes to proceed  - RBA of procedure discussed, questions answered  -  informed consent obtained and signed  - see procedure note  - cont Bromfenac QID OU  - continue PF QID OU  - f/u 4-5  weeks -- DFE/OCT possible injection(s)  3. Epiretinal membrane, left eye  - mild ERM with VMA/VMT component - BCVA 20/20 - asymptomatic, no metamorphopsia - no indication for surgery at this time - monitor for now  4,5. Hypertensive retinopathy OU  - discussed importance of tight BP control  - monitor  6. Pseudophakia OU  - s/p CE/IOL OU (Dr. June Leap, March/June 2020)  - beautiful surgeries, doing well  - monitor   Ophthalmic Meds Ordered this visit:  Meds ordered this encounter  Medications  . aflibercept (EYLEA) SOLN 2 mg       Return for f/u 4-5 weeks, NPDR OU, DFE, OCT.  There are no Patient Instructions on file for this visit.   Explained the diagnoses, plan, and follow up with the patient and they expressed understanding.  Patient expressed understanding of the importance of proper follow up care.   This document serves as a record of services personally performed by Karie Chimera, MD, PhD. It was created on their behalf by Annalee Genta, COMT. The creation of this record is the provider's dictation and/or activities during the visit.  Electronically signed by: Annalee Genta, COMT 04/18/20 5:06 PM  This document serves as a record of services personally performed by Karie Chimera, MD, PhD. It was created on their behalf by Glee Arvin. Manson Passey, OA an ophthalmic technician. The creation of this record is the provider's dictation and/or activities during the visit.    Electronically signed by: Glee Arvin. Manson Passey, [place] 09.08.2021 5:06 PM   Karie Chimera, M.D., Ph.D. Diseases & Surgery of the Retina and Vitreous [facility]  I have reviewed the above documentation for accuracy and completeness, and I agree with the above. Karie Chimera, M.D., Ph.D. 04/18/20 5:06 PM   Abbreviations: M myopia (nearsighted); A astigmatism; H hyperopia (farsighted); P presbyopia; Mrx spectacle prescription;  CTL contact lenses; OD right eye; OS left eye; OU both  eyes  XT exotropia; ET esotropia; PEK punctate epithelial keratitis; PEE punctate epithelial erosions; DES dry eye syndrome; MGD meibomian gland dysfunction; ATs artificial tears; PFAT's preservative free artificial tears; NSC nuclear sclerotic cataract; PSC posterior subcapsular cataract; ERM epi-retinal membrane; PVD posterior vitreous detachment; RD retinal detachment; DM diabetes mellitus; DR diabetic retinopathy; NPDR non-proliferative diabetic retinopathy; PDR proliferative diabetic retinopathy; CSME clinically significant macular edema; DME diabetic macular edema; dbh dot blot hemorrhages; CWS cotton wool spot; POAG primary open angle glaucoma; C/D cup-to-disc ratio; HVF humphrey visual field; GVF goldmann visual field; OCT optical coherence tomography; IOP intraocular pressure; BRVO Branch retinal vein occlusion; CRVO central retinal vein occlusion; CRAO central retinal artery occlusion; BRAO branch retinal artery occlusion; RT retinal tear; SB scleral buckle; PPV pars plana vitrectomy; VH Vitreous hemorrhage; PRP panretinal laser photocoagulation; IVK intravitreal kenalog; VMT vitreomacular traction; MH Macular hole;  NVD neovascularization of the disc; NVE neovascularization elsewhere; AREDS age related eye disease study; ARMD age related macular degeneration; POAG primary open angle glaucoma; EBMD epithelial/anterior basement membrane dystrophy; ACIOL anterior chamber intraocular lens; IOL intraocular lens; PCIOL posterior chamber intraocular lens; Phaco/IOL phacoemulsification with intraocular lens placement; PRK photorefractive keratectomy; LASIK laser assisted in situ keratomileusis; HTN hypertension; DM diabetes mellitus; COPD chronic obstructive pulmonary disease

## 2020-04-18 ENCOUNTER — Encounter (INDEPENDENT_AMBULATORY_CARE_PROVIDER_SITE_OTHER): Payer: Self-pay | Admitting: Ophthalmology

## 2020-04-18 ENCOUNTER — Ambulatory Visit (INDEPENDENT_AMBULATORY_CARE_PROVIDER_SITE_OTHER): Payer: PPO | Admitting: Ophthalmology

## 2020-04-18 ENCOUNTER — Other Ambulatory Visit: Payer: Self-pay

## 2020-04-18 DIAGNOSIS — Z961 Presence of intraocular lens: Secondary | ICD-10-CM

## 2020-04-18 DIAGNOSIS — I10 Essential (primary) hypertension: Secondary | ICD-10-CM

## 2020-04-18 DIAGNOSIS — H35033 Hypertensive retinopathy, bilateral: Secondary | ICD-10-CM

## 2020-04-18 DIAGNOSIS — H35372 Puckering of macula, left eye: Secondary | ICD-10-CM

## 2020-04-18 DIAGNOSIS — H3581 Retinal edema: Secondary | ICD-10-CM

## 2020-04-18 DIAGNOSIS — E113313 Type 2 diabetes mellitus with moderate nonproliferative diabetic retinopathy with macular edema, bilateral: Secondary | ICD-10-CM | POA: Diagnosis not present

## 2020-04-18 MED ORDER — AFLIBERCEPT 2MG/0.05ML IZ SOLN FOR KALEIDOSCOPE
2.0000 mg | INTRAVITREAL | Status: AC | PRN
  Administered 2020-04-18: 2 mg via INTRAVITREAL

## 2020-04-30 ENCOUNTER — Other Ambulatory Visit (INDEPENDENT_AMBULATORY_CARE_PROVIDER_SITE_OTHER): Payer: Self-pay

## 2020-04-30 DIAGNOSIS — Z86010 Personal history of colonic polyps: Secondary | ICD-10-CM

## 2020-04-30 DIAGNOSIS — Z800 Family history of malignant neoplasm of digestive organs: Secondary | ICD-10-CM

## 2020-05-02 ENCOUNTER — Encounter (HOSPITAL_COMMUNITY)
Admission: RE | Admit: 2020-05-02 | Discharge: 2020-05-02 | Disposition: A | Discharge status: Home / Self Care | Payer: PPO | Source: Ambulatory Visit | Attending: Gastroenterology | Admitting: Gastroenterology

## 2020-05-02 ENCOUNTER — Other Ambulatory Visit: Payer: Self-pay

## 2020-05-02 ENCOUNTER — Encounter (HOSPITAL_COMMUNITY): Payer: Self-pay

## 2020-05-02 ENCOUNTER — Other Ambulatory Visit: Payer: Self-pay | Admitting: Family Medicine

## 2020-05-02 ENCOUNTER — Other Ambulatory Visit (HOSPITAL_COMMUNITY)
Admission: RE | Admit: 2020-05-02 | Discharge: 2020-05-02 | Disposition: A | Discharge status: Home / Self Care | Payer: PPO | Source: Ambulatory Visit | Attending: Gastroenterology | Admitting: Gastroenterology

## 2020-05-02 DIAGNOSIS — Z86010 Personal history of colonic polyps: Secondary | ICD-10-CM

## 2020-05-02 DIAGNOSIS — Z20822 Contact with and (suspected) exposure to covid-19: Secondary | ICD-10-CM | POA: Insufficient documentation

## 2020-05-02 DIAGNOSIS — Z01812 Encounter for preprocedural laboratory examination: Secondary | ICD-10-CM | POA: Diagnosis not present

## 2020-05-02 DIAGNOSIS — E1169 Type 2 diabetes mellitus with other specified complication: Secondary | ICD-10-CM

## 2020-05-02 DIAGNOSIS — Z800 Family history of malignant neoplasm of digestive organs: Secondary | ICD-10-CM

## 2020-05-02 HISTORY — DX: Tremor, unspecified: R25.1

## 2020-05-02 HISTORY — DX: Anxiety disorder, unspecified: F41.9

## 2020-05-02 LAB — SARS CORONAVIRUS 2 (TAT 6-24 HRS): SARS Coronavirus 2: NEGATIVE

## 2020-05-02 NOTE — Patient Instructions (Signed)
    Carolyn Hunter  05/02/2020     @PREFPERIOPPHARMACY @   Your procedure is scheduled on  05/04/2020.  Report to 05/06/2020 at  0700  A.M.  Call this number if you have problems the morning of surgery:  [phone]   Remember:  Follow the diet and prep instructions given to you the office.                       Take these medicines the morning of surgery with A SIP OF WATER  Amlodipine.    Do not wear jewelry, make-up or nail polish.  Do not wear lotions, powders, or perfumes. Please wear deodorant and brush your teeth.  Do not shave 48 hours prior to surgery.  Men may shave face and neck.  Do not bring valuables to the hospital.  [facility] is not responsible for any belongings or valuables.  Contacts, dentures or bridgework may not be worn into surgery.  Leave your suitcase in the car.  After surgery it may be brought to your room.  For patients admitted to the hospital, discharge time will be determined by your treatment team.  Patients discharged the day of surgery will not be allowed to drive home.   Name and phone number of your driver:   family Special instructions:  None  Please read over the following fact sheets that you were given. Anesthesia Post-op Instructions and Care and Recovery After Surgery

## 2020-05-04 ENCOUNTER — Other Ambulatory Visit: Payer: Self-pay

## 2020-05-04 ENCOUNTER — Encounter (HOSPITAL_COMMUNITY): Payer: Self-pay | Admitting: Gastroenterology

## 2020-05-04 ENCOUNTER — Ambulatory Visit (HOSPITAL_COMMUNITY): Payer: PPO | Admitting: Anesthesiology

## 2020-05-04 ENCOUNTER — Ambulatory Visit (HOSPITAL_COMMUNITY)
Admission: RE | Admit: 2020-05-04 | Discharge: 2020-05-04 | Disposition: A | Discharge status: Home / Self Care | Payer: PPO | Attending: Gastroenterology | Admitting: Gastroenterology

## 2020-05-04 ENCOUNTER — Encounter (HOSPITAL_COMMUNITY)
Admission: RE | Disposition: A | Discharge status: Home / Self Care | Payer: Self-pay | Source: Home / Self Care | Attending: Gastroenterology

## 2020-05-04 DIAGNOSIS — K635 Polyp of colon: Secondary | ICD-10-CM | POA: Diagnosis not present

## 2020-05-04 DIAGNOSIS — Z09 Encounter for follow-up examination after completed treatment for conditions other than malignant neoplasm: Secondary | ICD-10-CM | POA: Diagnosis not present

## 2020-05-04 DIAGNOSIS — Z79899 Other long term (current) drug therapy: Secondary | ICD-10-CM | POA: Diagnosis not present

## 2020-05-04 DIAGNOSIS — Z7984 Long term (current) use of oral hypoglycemic drugs: Secondary | ICD-10-CM | POA: Diagnosis not present

## 2020-05-04 DIAGNOSIS — E785 Hyperlipidemia, unspecified: Secondary | ICD-10-CM | POA: Diagnosis not present

## 2020-05-04 DIAGNOSIS — Z86010 Personal history of colonic polyps: Secondary | ICD-10-CM | POA: Diagnosis not present

## 2020-05-04 DIAGNOSIS — E11319 Type 2 diabetes mellitus with unspecified diabetic retinopathy without macular edema: Secondary | ICD-10-CM | POA: Insufficient documentation

## 2020-05-04 DIAGNOSIS — K5730 Diverticulosis of large intestine without perforation or abscess without bleeding: Secondary | ICD-10-CM | POA: Diagnosis not present

## 2020-05-04 DIAGNOSIS — I10 Essential (primary) hypertension: Secondary | ICD-10-CM | POA: Diagnosis not present

## 2020-05-04 DIAGNOSIS — Z1211 Encounter for screening for malignant neoplasm of colon: Secondary | ICD-10-CM | POA: Diagnosis not present

## 2020-05-04 DIAGNOSIS — K621 Rectal polyp: Secondary | ICD-10-CM | POA: Diagnosis not present

## 2020-05-04 DIAGNOSIS — Z7982 Long term (current) use of aspirin: Secondary | ICD-10-CM | POA: Diagnosis not present

## 2020-05-04 DIAGNOSIS — K648 Other hemorrhoids: Secondary | ICD-10-CM | POA: Diagnosis not present

## 2020-05-04 DIAGNOSIS — Z800 Family history of malignant neoplasm of digestive organs: Secondary | ICD-10-CM | POA: Diagnosis not present

## 2020-05-04 HISTORY — PX: COLONOSCOPY WITH PROPOFOL: SHX5780

## 2020-05-04 HISTORY — PX: POLYPECTOMY: SHX5525

## 2020-05-04 LAB — GLUCOSE, CAPILLARY
Glucose-Capillary: 166 mg/dL — ABNORMAL HIGH (ref 70–99)
Glucose-Capillary: 150 mg/dL — ABNORMAL HIGH (ref 70–99)

## 2020-05-04 SURGERY — COLONOSCOPY WITH PROPOFOL
Anesthesia: General

## 2020-05-04 MED ORDER — STERILE WATER FOR IRRIGATION IR SOLN
Status: DC | PRN
  Administered 2020-05-04: 1.5 mL

## 2020-05-04 MED ORDER — PROMETHAZINE HCL 25 MG/ML IJ SOLN: 6.2500 mg | INTRAMUSCULAR | Status: DC | PRN

## 2020-05-04 MED ORDER — CHLORHEXIDINE GLUCONATE CLOTH 2 % EX PADS: 6.0000 | MEDICATED_PAD | Freq: Once | CUTANEOUS | Status: DC

## 2020-05-04 MED ORDER — HYDROMORPHONE HCL 1 MG/ML IJ SOLN: 0.2500 mg | INTRAMUSCULAR | Status: DC | PRN

## 2020-05-04 MED ORDER — LACTATED RINGERS IV SOLN: Freq: Once | INTRAVENOUS | Status: AC

## 2020-05-04 MED ORDER — PROPOFOL 500 MG/50ML IV EMUL
INTRAVENOUS | Status: DC | PRN
  Administered 2020-05-04: 150 ug/kg/min via INTRAVENOUS

## 2020-05-04 MED ORDER — PROPOFOL 10 MG/ML IV BOLUS
INTRAVENOUS | Status: DC | PRN
  Administered 2020-05-04: 50 mg via INTRAVENOUS

## 2020-05-04 MED ORDER — MEPERIDINE HCL 50 MG/ML IJ SOLN: 6.2500 mg | INTRAMUSCULAR | Status: DC | PRN

## 2020-05-04 MED ORDER — PROPOFOL 10 MG/ML IV BOLUS
INTRAVENOUS | Status: AC
  Filled 2020-05-04: qty 20

## 2020-05-04 MED ORDER — PHENYLEPHRINE HCL (PRESSORS) 10 MG/ML IV SOLN
INTRAVENOUS | Status: DC | PRN
  Administered 2020-05-04 (×3): 100 ug via INTRAVENOUS

## 2020-05-04 MED ORDER — LACTATED RINGERS IV SOLN: INTRAVENOUS | Status: DC | PRN

## 2020-05-04 NOTE — H&P (Signed)
Carolyn Hunter is an 69 y.o. female.   Chief Complaint: screening colonoscopy  HPI: 69 year old female with past medical history of diabetes, hypertension, hyperlipidemia and anxiety, who comes to the hospital to undergo screening colonoscopy.  The patient had a colonoscopy 6 years ago, had some polyps.  She denies having any complaints such as melena, hematochezia, abdominal pain or distention, change in her bowel movement consistency or frequency, no changes in her weight recently.  Family history of colorectal cancer - mother diagnosed in her mid 53s.   Past Medical History:  Diagnosis Date  . Anxiety   . Cataract    OS  . Diabetes mellitus without complication (HCC)   . Diabetic retinopathy (HCC)    NPDR OU  . Hyperlipidemia   . Hypertension   . Hypertensive retinopathy    OU  . Tremors of nervous system    "just when I get in a nervous Situation".    Past Surgical History:  Procedure Laterality Date  . CATARACT EXTRACTION W/PHACO Right 10/18/2018   Procedure: CATARACT EXTRACTION PHACO AND INTRAOCULAR LENS PLACEMENT (IOC);  Surgeon: Fabio Pierce, MD;  Location: [facility];  Service: Ophthalmology;  Laterality: Right;  CDE: 8.11  . CATARACT EXTRACTION W/PHACO Left 01/17/2019   Procedure: CATARACT EXTRACTION PHACO AND INTRAOCULAR LENS PLACEMENT (IOC);  Surgeon: Fabio Pierce, MD;  Location: [facility];  Service: Ophthalmology;  Laterality: Left;  CDE: 6.38  . CESAREAN SECTION     1988  . EYE SURGERY Right    Cataract extraction OD    Family History  Problem Relation Age of Onset  . Cancer Mother        breast and colon cancer  . Breast cancer Mother   . Diabetes Brother   . Diabetes Son   . Asthma Father        COPD  . Diabetes Son    Social History:  reports that she has never smoked. She has never used smokeless tobacco. She reports that she does not drink alcohol and does not use drugs.  Allergies:  Allergies  Allergen Reactions  . Ace Inhibitors Cough    Medications  Prior to Admission  Medication Sig Dispense Refill  . Aflibercept (EYLEA IO) Inject 1 Dose into the eye every 30 (thirty) days.    Marland Kitchen amLODipine (NORVASC) 5 MG tablet Take 1 tablet (5 mg total) by mouth daily. 90 tablet 0  . aspirin EC 81 MG tablet Take 1 tablet (81 mg total) by mouth daily.    . Bromfenac Sodium 0.09 % SOLN Place 1 drop into both eyes 2 (two) times daily. 4 mL 3  . Cholecalciferol (VITAMIN D3) 50 MCG (2000 UT) TABS Take 2,000 Units by mouth daily.    . Liniments (SALONPAS PAIN RELIEF PATCH EX) Place 1 patch onto the skin daily as needed (pain.).    Marland Kitchen metFORMIN (GLUCOPHAGE) 1000 MG tablet Take 1 tablet (1,000 mg total) by mouth 2 (two) times daily with a meal. 180 tablet 1  . prednisoLONE acetate (PRED FORTE) 1 % ophthalmic suspension Place 1 drop into both eyes 4 (four) times daily. 15 mL 1  . simvastatin (ZOCOR) 40 MG tablet Take 1 tablet (40 mg total) by mouth at bedtime. 90 tablet 1  . Bromfenac Sodium (PROLENSA) 0.07 % SOLN Place 1 drop into both eyes in the morning, at noon, in the evening, and at bedtime. (Patient taking differently: Place 1 drop into both eyes in the morning and at bedtime. ) 3 mL 5  Results for orders placed or performed during the hospital encounter of 05/04/20 (from the past 48 hour(s))  Glucose, capillary     Status: Abnormal   Collection Time: 05/04/20  7:10 AM  Result Value Ref Range   Glucose-Capillary 166 (H) 70 - 99 mg/dL    Comment: Glucose reference range applies only to samples taken after fasting for at least 8 hours.   No results found.  Review of Systems  Constitutional: Negative.   HENT: Negative.   Eyes: Negative.   Respiratory: Negative.   Cardiovascular: Negative.   Gastrointestinal: Negative.   Endocrine: Negative.   Genitourinary: Negative.   Musculoskeletal: Negative.   Skin: Negative.   Allergic/Immunologic: Negative.   Neurological: Negative.   Hematological: Negative.   Psychiatric/Behavioral: Negative.      Blood pressure (!) 164/92, pulse (!) 115, temperature 98.6 F (37 C), temperature source Oral, resp. rate 16, SpO2 98 %. Physical Exam  GENERAL: The patient is AO x3, in no acute distress. HEENT: Head is normocephalic and atraumatic. EOMI are intact. Mouth is well hydrated and without lesions. NECK: Supple. No masses LUNGS: Clear to auscultation. No presence of rhonchi/wheezing/rales. Adequate chest expansion HEART: RRR, normal s1 and s2. ABDOMEN: Soft, nontender, no guarding, no peritoneal signs, and nondistended. BS +. No masses. EXTREMITIES: Without any cyanosis, clubbing, rash, lesions or edema. NEUROLOGIC: AOx3, no focal motor deficit. SKIN: no jaundice, no rashes  Assessment/Plan 69 year old female with past medical history of diabetes, hypertension, hyperlipidemia and anxiety, who comes to the hospital to undergo screening colonoscopy.  The patient is at increased risk for colorectal cancer given history of colon polyps and FHx cancer.  We will proceed with colonoscopy today for surveillance.  Dolores Frame, MD 05/04/2020, 7:47 AM

## 2020-05-04 NOTE — Op Note (Signed)
[facility] Patient Name: Carolyn Hunter Procedure Date: 05/04/2020 8:39 AM MRN: [id number] Date of Birth: 12/19/1950 Attending MD: Katrinka Blazing ,  CSN: [id number] Age: 69 Admit Type: Outpatient Procedure:                Colonoscopy Indications:              High risk colon cancer surveillance: Personal                            history of colonic polyps Providers:                Katrinka Blazing, Angelica Ran, Dyann Ruddle Referring MD:              Medicines:                Monitored Anesthesia Care Complications:            No immediate complications. Estimated Blood Loss:     Estimated blood loss: none. Procedure:                Pre-Anesthesia Assessment:                           - Prior to the procedure, a History and Physical                            was performed, and patient medications, allergies                            and sensitivities were reviewed. The patient's                            tolerance of previous anesthesia was reviewed.                           - The risks and benefits of the procedure and the                            sedation options and risks were discussed with the                            patient. All questions were answered and informed                            consent was obtained.                           - ASA Grade Assessment: II - A patient with mild                            systemic disease.                           After obtaining informed consent, the colonoscope                            was passed under direct vision. Throughout the  procedure, the patient's blood pressure, pulse, and                            oxygen saturations were monitored continuously. The                            [id number]-[id number] ([id number] scope was introduced through                            the anus and advanced to the the cecum, identified                            by appendiceal orifice and ileocecal valve. The                             colonoscopy was performed without difficulty. The                            patient tolerated the procedure well. Scope In: 8:58:25 AM Scope Out: 9:25:43 AM Scope Withdrawal Time: 0 hours 15 minutes 35 seconds  Total Procedure Duration: 0 hours 27 minutes 18 seconds  Findings:      The perianal and digital rectal examinations were normal.      Two sessile polyps were found in the rectum and transverse colon. The       polyps were 2 mm in size. These polyps were removed with a cold biopsy       forceps. Resection and retrieval were complete.      A few small-mouthed diverticula were found in the sigmoid colon.      Non-bleeding internal hemorrhoids were found during retroflexion. The       hemorrhoids were small. Impression:               - Two 2 mm polyps in the rectum and in the                            transverse colon, removed with a cold biopsy                            forceps. Resected and retrieved.                           - Diverticulosis in the sigmoid colon.                           - Non-bleeding internal hemorrhoids. Moderate Sedation:      Per Anesthesia Care Recommendation:           - Discharge patient to home (ambulatory).                           - Resume previous diet.                           - Await pathology results.                           -  Repeat colonoscopy in 5 years for surveillance. Procedure Code(s):        --- Professional ---                           [phone], GC, Colonoscopy, flexible; with biopsy,                            single or multiple Diagnosis Code(s):        --- Professional ---                           Z86.010, Personal history of colonic polyps                           K62.1, Rectal polyp                           K63.5, Polyp of colon                           K64.8, Other hemorrhoids                           K57.30, Diverticulosis of large intestine without                            perforation or  abscess without bleeding CPT copyright 2019 [employer]. All rights reserved. The codes documented in this report are preliminary and upon coder review may  be revised to meet current compliance requirements. Katrinka Blazing, MD Katrinka Blazing,  05/04/2020 9:29:51 AM This report has been signed electronically. Number of Addenda: 0

## 2020-05-04 NOTE — Anesthesia Preprocedure Evaluation (Signed)
Anesthesia Evaluation  Patient identified by MRN, date of birth, ID band Patient awake    Reviewed: Allergy & Precautions, NPO status , Patient's Chart, lab work & pertinent test results  History of Anesthesia Complications Negative for: history of anesthetic complications  Airway Mallampati: II  TM Distance: >3 FB Neck ROM: Full    Dental  (+) Dental Advisory Given, Missing, Partial Lower   Pulmonary neg pulmonary ROS,    Pulmonary exam normal breath sounds clear to auscultation       Cardiovascular hypertension, Pt. on medications Normal cardiovascular exam Rhythm:Regular Rate:Normal     Neuro/Psych Anxiety Tremors     GI/Hepatic negative GI ROS, Neg liver ROS,   Endo/Other  diabetes, Well Controlled, Type 2, Oral Hypoglycemic Agents  Renal/GU   negative genitourinary   Musculoskeletal negative musculoskeletal ROS (+)   Abdominal   Peds negative pediatric ROS (+)  Hematology   Anesthesia Other Findings   Reproductive/Obstetrics negative OB ROS                             Anesthesia Physical Anesthesia Plan  ASA: II  Anesthesia Plan: General   Post-op Pain Management:    Induction: Intravenous  PONV Risk Score and Plan: TIVA  Airway Management Planned: Nasal Cannula and Natural Airway  Additional Equipment:   Intra-op Plan:   Post-operative Plan:   Informed Consent: I have reviewed the patients History and Physical, chart, labs and discussed the procedure including the risks, benefits and alternatives for the proposed anesthesia with the patient or authorized representative who has indicated his/her understanding and acceptance.       Plan Discussed with: CRNA and Surgeon  Anesthesia Plan Comments:         Anesthesia Quick Evaluation

## 2020-05-04 NOTE — Discharge Instructions (Signed)
You are being discharged to home.  Resume your previous diet.  We are waiting for your pathology results.  Your physician has recommended a repeat colonoscopy in five years for surveillance.   Colonoscopy, Adult, Care After This sheet gives you information about how to care for yourself after your procedure. Your doctor may also give you more specific instructions. If you have problems or questions, call your doctor. What can I expect after the procedure? After the procedure, it is common to have:  A small amount of blood in your poop (stool) for 24 hours.  Some gas.  Mild cramping or bloating in your belly (abdomen). Follow these instructions at home: Eating and drinking   Drink enough fluid to keep your pee (urine) pale yellow.  Follow instructions from your doctor about what you cannot eat or drink.  Return to your normal diet as told by your doctor. Avoid heavy or fried foods that are hard to digest. Activity  Rest as told by your doctor.  Do not sit for a long time without moving. Get up to take short walks every 1-2 hours. This is important. Ask for help if you feel weak or unsteady.  Return to your normal activities as told by your doctor. Ask your doctor what activities are safe for you. To help cramping and bloating:   Try walking around.  Put heat on your belly as told by your doctor. Use the heat source that your doctor recommends, such as a moist heat pack or a heating pad. ? Put a towel between your skin and the heat source. ? Leave the heat on for 20-30 minutes. ? Remove the heat if your skin turns bright red. This is very important if you are unable to feel pain, heat, or cold. You may have a greater risk of getting burned. General instructions  For the first 24 hours after the procedure: ? Do not drive or use machinery. ? Do not sign important documents. ? Do not drink alcohol. ? Do your daily activities more slowly than normal. ? Eat foods that are soft  and easy to digest.  Take over-the-counter or prescription medicines only as told by your doctor.  Keep all follow-up visits as told by your doctor. This is important. Contact a doctor if:  You have blood in your poop 2-3 days after the procedure. Get help right away if:  You have more than a small amount of blood in your poop.  You see large clumps of tissue (blood clots) in your poop.  Your belly is swollen.  You feel like you may vomit (nauseous).  You vomit.  You have a fever.  You have belly pain that gets worse, and medicine does not help your pain. Summary  After the procedure, it is common to have a small amount of blood in your poop. You may also have mild cramping and bloating in your belly.  For the first 24 hours after the procedure, do not drive or use machinery, do not sign important documents, and do not drink alcohol.  Get help right away if you have a lot of blood in your poop, feel like you may vomit, have a fever, or have more belly pain. This information is not intended to replace advice given to you by your health care provider. Make sure you discuss any questions you have with your health care provider. Document Revised: 02/21/2019 Document Reviewed: 02/21/2019 Elsevier Patient Education  2020 [employer].

## 2020-05-04 NOTE — Transfer of Care (Signed)
Immediate Anesthesia Transfer of Care Note  Patient: Carolyn Hunter  Procedure(s) Performed: COLONOSCOPY WITH PROPOFOL (N/A ) POLYPECTOMY  Patient Location: PACU  Anesthesia Type:General  Level of Consciousness: awake, alert , oriented and patient cooperative  Airway & Oxygen Therapy: Patient Spontanous Breathing  Post-op Assessment: Report given to RN, Post -op Vital signs reviewed and stable and Patient moving all extremities X 4  Post vital signs: Reviewed and stable  Last Vitals:  Vitals Value Taken Time  BP    Temp    Pulse 89 05/04/20 0932  Resp 15 05/04/20 0932  SpO2 98 % 05/04/20 0932  Vitals shown include unvalidated device data.  Last Pain:  Vitals:   05/04/20 0848  TempSrc:   PainSc: 0-No pain         Complications: No complications documented.

## 2020-05-04 NOTE — Anesthesia Postprocedure Evaluation (Signed)
Anesthesia Post Note  Patient: Carolyn Hunter  Procedure(s) Performed: COLONOSCOPY WITH PROPOFOL (N/A ) POLYPECTOMY  Patient location during evaluation: PACU Anesthesia Type: General Level of consciousness: awake, oriented and awake and alert Pain management: satisfactory to patient Vital Signs Assessment: post-procedure vital signs reviewed and stable Respiratory status: spontaneous breathing, respiratory function stable and nonlabored ventilation Cardiovascular status: stable Postop Assessment: no apparent nausea or vomiting Anesthetic complications: no   No complications documented.   Last Vitals:  Vitals:   05/04/20 0717 05/04/20 0933  BP: (!) 164/92 (!) (P) 95/49  Pulse: (!) 115 89  Resp: 16 15  Temp: 37 C (P) 36.4 C  SpO2: 98% 98%    Last Pain:  Vitals:   05/04/20 0848  TempSrc:   PainSc: 0-No pain                 Addysyn Fern

## 2020-05-07 LAB — SURGICAL PATHOLOGY

## 2020-05-08 ENCOUNTER — Encounter (INDEPENDENT_AMBULATORY_CARE_PROVIDER_SITE_OTHER): Payer: Self-pay | Admitting: *Deleted

## 2020-05-09 ENCOUNTER — Encounter (HOSPITAL_COMMUNITY): Payer: Self-pay | Admitting: Gastroenterology

## 2020-05-15 NOTE — Progress Notes (Signed)
[facility] - Clinic Note  05/16/2020     CHIEF COMPLAINT Patient presents for Retina Follow Up   HISTORY OF PRESENT ILLNESS: Carolyn Hunter is a 69 y.o. female who presents to the clinic today for:   HPI    Retina Follow Up    Patient presents with  Diabetic Retinopathy.  In both eyes.  This started 4 weeks ago.  I, the attending physician,  performed the HPI with the patient and updated documentation appropriately.          Comments    Patient here for 4 weeks retina follow up for NPDR OU. Patient states vision doing good. About the same. No eye pain.       Last edited by Rennis Chris, MD on 05/17/2020  2:27 PM. (History)    pt states vision is doing well, she is still using PF and Prolensa QID  Referring physician: Freddy Finner, NP [address] [place],  [place] [address]  HISTORICAL INFORzMATION:   Selected notes from the MEDICAL RECORD NUMBER Referral from Dr. Fabienne Bruns for DM exam   CURRENT MEDICATIONS: Current Outpatient Medications (Ophthalmic Drugs)  Medication Sig  . Aflibercept (EYLEA IO) Inject 1 Dose into the eye every 30 (thirty) days.  . Bromfenac Sodium (PROLENSA) 0.07 % SOLN Place 1 drop into both eyes in the morning, at noon, in the evening, and at bedtime. (Patient taking differently: Place 1 drop into both eyes in the morning and at bedtime. )  . Bromfenac Sodium 0.09 % SOLN Place 1 drop into both eyes 2 (two) times daily.  . prednisoLONE acetate (PRED FORTE) 1 % ophthalmic suspension Place 1 drop into both eyes 4 (four) times daily.   No current facility-administered medications for this visit. (Ophthalmic Drugs)   Current Outpatient Medications (Other)  Medication Sig  . amLODipine (NORVASC) 5 MG tablet Take 1 tablet (5 mg total) by mouth daily.  Marland Kitchen aspirin EC 81 MG tablet Take 1 tablet (81 mg total) by mouth daily.  . Cholecalciferol (VITAMIN D3) 50 MCG (2000 UT) TABS Take 2,000 Units by mouth daily.  . Liniments (SALONPAS  PAIN RELIEF PATCH EX) Place 1 patch onto the skin daily as needed (pain.).  Marland Kitchen metFORMIN (GLUCOPHAGE) 1000 MG tablet Take 1 tablet (1,000 mg total) by mouth 2 (two) times daily with a meal.  . simvastatin (ZOCOR) 40 MG tablet Take 1 tablet (40 mg total) by mouth at bedtime.   No current facility-administered medications for this visit. (Other)      REVIEW OF SYSTEMS: ROS    Positive for: Neurological, Endocrine, Eyes   Negative for: Constitutional, Gastrointestinal, Skin, Genitourinary, Musculoskeletal, HENT, Cardiovascular, Respiratory, Psychiatric, Allergic/Imm, Heme/Lymph   Last edited by Laddie Aquas, COA on 05/16/2020  1:38 PM. (History)       ALLERGIES Allergies  Allergen Reactions  . Ace Inhibitors Cough    PAST MEDICAL HISTORY Past Medical History:  Diagnosis Date  . Anxiety   . Cataract    OS  . Diabetes mellitus without complication (HCC)   . Diabetic retinopathy (HCC)    NPDR OU  . Hyperlipidemia   . Hypertension   . Hypertensive retinopathy    OU  . Tremors of nervous system    "just when I get in a nervous Situation".   Past Surgical History:  Procedure Laterality Date  . CATARACT EXTRACTION W/PHACO Right 10/18/2018   Procedure: CATARACT EXTRACTION PHACO AND INTRAOCULAR LENS PLACEMENT (IOC);  Surgeon:  Fabio Pierce, MD;  Location: [facility];  Service: Ophthalmology;  Laterality: Right;  CDE: 8.11  . CATARACT EXTRACTION W/PHACO Left 01/17/2019   Procedure: CATARACT EXTRACTION PHACO AND INTRAOCULAR LENS PLACEMENT (IOC);  Surgeon: Fabio Pierce, MD;  Location: [facility];  Service: Ophthalmology;  Laterality: Left;  CDE: 6.38  . CESAREAN SECTION     1988  . COLONOSCOPY WITH PROPOFOL N/A 05/04/2020   Procedure: COLONOSCOPY WITH PROPOFOL;  Surgeon: Dolores Frame, MD;  Location: [facility];  Service: Gastroenterology;  Laterality: N/A;  830  . EYE SURGERY Right    Cataract extraction OD  . POLYPECTOMY  05/04/2020   Procedure: POLYPECTOMY;  Surgeon:  Dolores Frame, MD;  Location: [facility];  Service: Gastroenterology;;    FAMILY HISTORY Family History  Problem Relation Age of Onset  . Cancer Mother        breast and colon cancer  . Breast cancer Mother   . Diabetes Brother   . Diabetes Son   . Asthma Father        COPD  . Diabetes Son     SOCIAL HISTORY Social History   Tobacco Use  . Smoking status: Never Smoker  . Smokeless tobacco: Never Used  Vaping Use  . Vaping Use: Never used  Substance Use Topics  . Alcohol use: No  . Drug use: No         OPHTHALMIC EXAM:  Base Eye Exam    Visual Acuity (Snellen - Linear)      Right Left   Dist Garner 20/20 -1 20/20 -1       Tonometry (Tonopen, 1:36 PM)      Right Left   Pressure 22 20       Pupils      Dark Light Shape React APD   Right 3 2 Round Brisk None   Left 3 2 Round Brisk None       Visual Fields (Counting fingers)      Left Right    Full Full       Extraocular Movement      Right Left    Full Full       Neuro/Psych    Oriented x3: Yes   Mood/Affect: Normal       Dilation    Both eyes: 1.0% Mydriacyl, 2.5% Phenylephrine @ 1:35 PM        Slit Lamp and Fundus Exam    Slit Lamp Exam      Right Left   Lids/Lashes Dermatochalasis - upper lid, Meibomian gland dysfunction Dermatochalasis - upper lid, Meibomian gland dysfunction   Conjunctiva/Sclera White and quiet White and quiet   Cornea Mild Arcus, Well healed temporal cataract wounds Mild Arcus, EBMD, 2+ Punctate epithelial erosions, Well healed temporal cataract wounds   Anterior Chamber Deep, 1+cell/pigment Deep and quiet   Iris Round and dilated, No NVI, PPM nasally Round and dilated, No NVI   Lens PC IOL in good position PC IOL in good position, trace PCO   Vitreous Vitreous syneresis, vitreous condensations Vitreous syneresis       Fundus Exam      Right Left   Disc mild pallor, Sharp rim mild pallor, Sharp rim   C/D Ratio 0.3 0.4   Macula Flat, good foveal  reflex, interval improvement in central cystic changes, mild, focal exudate temporal macula, epiretinal membrane blunted foveal reflex, epiretinal membrane, scattered MA, trace cystic changes temporal macula-- persistent    Vessels Vascular attenuation, Tortuous, AV  crossing changes Vascular attenuation, Tortuous   Periphery Attached, scattered MAs, DBH, exudates inferior to disc - resolved attached, scattered DBH greatest nasally and IN, focal exudates nasal midzone          IMAGING AND PROCEDURES  Imaging and Procedures for 12/10/17  OCT, Retina - OU - Both Eyes       Right Eye Quality was good. Central Foveal Thickness: 320. Progression has improved. Findings include abnormal foveal contour, intraretinal hyper-reflective material, intraretinal fluid, no SRF, outer retinal atrophy (interval improvement in IRF/IRHM/foveal contour -- likely combination of DME/CME; partial PVD).   Left Eye Quality was good. Central Foveal Thickness: 381. Progression has been stable. Findings include abnormal foveal contour, intraretinal fluid, intraretinal hyper-reflective material, vitreomacular adhesion , epiretinal membrane (Persistent cystic changes temporal macula).   Notes **Images taken, stored on drive**  Diagnosis / Impression:  DME OU (OD>OS) OD: interval improvement in IRF/IRHM/foveal contour -- likely combination of DME/CME; partial PVD OS: persistent cystic changes temporal macula  Clinical management:  See below  Abbreviations: NFP - Normal foveal profile. CME - cystoid macular edema. PED - pigment epithelial detachment. IRF - intraretinal fluid. SRF - subretinal fluid. EZ - ellipsoid zone. ERM - epiretinal membrane. ORA - outer retinal atrophy. ORT - outer retinal tubulation. SRHM - subretinal hyper-reflective material         Intravitreal Injection, Pharmacologic Agent - OD - Right Eye       Time Out 05/16/2020. 2:17 PM. Confirmed correct patient, procedure, site, and patient  consented.   Anesthesia Topical anesthesia was used. Anesthetic medications included Lidocaine 2%, Proparacaine 0.5%.   Procedure Preparation included 5% betadine to ocular surface, eyelid speculum. A (32g) needle was used.   Injection:  2 mg aflibercept Gretta Cool) SOLN   NDC: L6038910, Lot: [id number], Expiration date: 08/11/2020   Route: Intravitreal, Site: Right Eye, Waste: 0.05 mL  Post-op Post injection exam found visual acuity of at least counting fingers. The patient tolerated the procedure well. There were no complications. The patient received written and verbal post procedure care education. Post injection medications were not given.                 ASSESSMENT/PLAN:    ICD-10-CM   1. 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 Retinal edema  H35.81 OCT, Retina - OU - Both Eyes  3. Epiretinal membrane (ERM) of left eye  H35.372   4. Essential hypertension  I10   5. Hypertensive retinopathy of both eyes  H35.033   6. Pseudophakia of both eyes  Z96.1     1,2. Moderate nonproliferative diabetic retinopathy, both eyes  - Diabetic macular edema, both eyes -- relatively mild  - S/P IVA OD #1 (12.17.18), #2 (01.28.19), #3 (02.27.19), #4 (04.03.19), #5 (05.01.19), #6 (05.27.20), #7 (07.08.20)  - in reviewing her case, there was minimal response to IVA in OCT or BCVA  - S/P IVTA OD #1 (05.29.19), #2 (07.24.19), #3 (09.04.19), #4 (12.04.19), #5 (02.26.20), # 6 (08.05.20), # 7(09.30.20), #8 (12.23.20)  - slightly better response with IVTA  - S/P focal laser OS (09.02.20)  - s/p IVE OD #1 (03.03.21), #2 (03.31.21), #3 (04.28.21), #4 (05.28.21), #5 (08.04.21), #6 (09.08.21)  - s/p IVE OS #1 (04.28.21), #2 (05.28.21)  - OCT shows interval improvement in IRF OD; OS stable  - BCVA  stable at  20/20 OU  - recommend IVE OD #7 today, 10.06.21; will hold IVE OS today  - pt wishes to proceed  - RBA of procedure discussed, questions answered  - informed consent obtained and signed  - see procedure note  - cont Bromfenac QID OU -- decrease to BID OU  - continue PF QID OU -- decrease to BID OU  - f/u 6 weeks -- DFE/OCT possible injection(s)  3. Epiretinal membrane, left eye  - mild ERM with VMA/VMT component - BCVA 20/20 - asymptomatic, no metamorphopsia - no indication for surgery at this time - monitor for now  4,5. Hypertensive retinopathy OU  - discussed importance of tight BP control  - monitor  6. Pseudophakia OU  - s/p CE/IOL OU (Dr. June LeapWrzosek, March/June 2020)  - beautiful surgeries, doing well  - monitor   Ophthalmic Meds Ordered this visit:  Meds ordered this encounter  Medications  . aflibercept (EYLEA) SOLN 2 mg       Return in about 6 weeks (around 06/27/2020) for f/u NPDR OU, DFE, OCT.  There are no Patient Instructions on file for this visit.   Explained the diagnoses, plan, and follow up with the patient and they expressed understanding.  Patient expressed understanding of the importance of proper follow up care.   This document serves as a record of services personally performed by Karie ChimeraBrian G. Christien Berthelot, MD, PhD. It was created on their behalf by Annalee Gentaaryl Barber, COMT. The creation of this record is the provider's dictation and/or activities during the visit.  Electronically signed by: Annalee Gentaaryl Barber, COMT 05/17/20 2:29 PM   This document serves as a record of services personally performed by Karie ChimeraBrian G. Watson Robarge, MD, PhD. It was created on their behalf by Glee ArvinAmanda J. Manson PasseyBrown, OA an ophthalmic technician. The creation of this record is the provider's dictation and/or activities during the visit.    Electronically signed by: Glee ArvinAmanda J. Manson PasseyBrown, [place] 10.06.2021 2:29 PM  Karie ChimeraBrian G. Zafirah Vanzee, M.D., Ph.D. Diseases & Surgery of the Retina and Vitreous [facility]  I have reviewed the above documentation for accuracy and completeness, and I agree with the above. Karie ChimeraBrian G. Londynn Sonoda, M.D., Ph.D. 05/17/20 2:30 PM   Abbreviations: M myopia (nearsighted); A astigmatism; H hyperopia (farsighted); P presbyopia; Mrx spectacle prescription;  CTL contact lenses; OD right eye; OS left eye; OU both eyes  XT exotropia; ET esotropia; PEK punctate epithelial keratitis; PEE punctate epithelial erosions; DES dry eye syndrome; MGD meibomian gland dysfunction; ATs artificial tears; PFAT's preservative free artificial tears; NSC nuclear sclerotic cataract; PSC posterior subcapsular cataract; ERM epi-retinal membrane; PVD posterior vitreous detachment; RD retinal detachment; DM diabetes mellitus; DR diabetic retinopathy; NPDR non-proliferative diabetic retinopathy; PDR proliferative diabetic retinopathy; CSME clinically significant macular edema; DME diabetic macular edema; dbh dot blot hemorrhages; CWS cotton wool spot; POAG primary open angle glaucoma; C/D cup-to-disc ratio; HVF humphrey visual field; GVF goldmann visual field; OCT optical coherence tomography; IOP intraocular pressure; BRVO Branch retinal vein occlusion; CRVO central retinal vein occlusion; CRAO central retinal artery occlusion; BRAO branch retinal artery occlusion; RT retinal tear; SB scleral buckle; PPV pars plana vitrectomy; VH Vitreous hemorrhage; PRP panretinal laser photocoagulation; IVK intravitreal kenalog; VMT vitreomacular traction; MH Macular hole;  NVD neovascularization of the disc; NVE neovascularization elsewhere; AREDS age related eye disease study; ARMD age related macular degeneration; POAG primary open angle glaucoma; EBMD epithelial/anterior basement membrane dystrophy; ACIOL anterior chamber intraocular lens; IOL intraocular lens; PCIOL posterior chamber intraocular lens;  Phaco/IOL phacoemulsification with intraocular lens placement; [place] photorefractive keratectomy; LASIK laser assisted in situ  keratomileusis; HTN hypertension; DM diabetes mellitus; COPD chronic obstructive pulmonary disease

## 2020-05-16 ENCOUNTER — Other Ambulatory Visit: Payer: Self-pay

## 2020-05-16 ENCOUNTER — Encounter (INDEPENDENT_AMBULATORY_CARE_PROVIDER_SITE_OTHER): Payer: Self-pay | Admitting: Ophthalmology

## 2020-05-16 ENCOUNTER — Ambulatory Visit (INDEPENDENT_AMBULATORY_CARE_PROVIDER_SITE_OTHER): Payer: PPO | Admitting: Ophthalmology

## 2020-05-16 DIAGNOSIS — H3581 Retinal edema: Secondary | ICD-10-CM | POA: Diagnosis not present

## 2020-05-16 DIAGNOSIS — H35033 Hypertensive retinopathy, bilateral: Secondary | ICD-10-CM

## 2020-05-16 DIAGNOSIS — Z961 Presence of intraocular lens: Secondary | ICD-10-CM | POA: Diagnosis not present

## 2020-05-16 DIAGNOSIS — E113313 Type 2 diabetes mellitus with moderate nonproliferative diabetic retinopathy with macular edema, bilateral: Secondary | ICD-10-CM

## 2020-05-16 DIAGNOSIS — H35372 Puckering of macula, left eye: Secondary | ICD-10-CM

## 2020-05-16 DIAGNOSIS — I10 Essential (primary) hypertension: Secondary | ICD-10-CM | POA: Diagnosis not present

## 2020-05-17 ENCOUNTER — Encounter (INDEPENDENT_AMBULATORY_CARE_PROVIDER_SITE_OTHER): Payer: Self-pay | Admitting: Ophthalmology

## 2020-05-17 DIAGNOSIS — H3581 Retinal edema: Secondary | ICD-10-CM | POA: Diagnosis not present

## 2020-05-17 DIAGNOSIS — E113313 Type 2 diabetes mellitus with moderate nonproliferative diabetic retinopathy with macular edema, bilateral: Secondary | ICD-10-CM | POA: Diagnosis not present

## 2020-05-17 DIAGNOSIS — I10 Essential (primary) hypertension: Secondary | ICD-10-CM | POA: Diagnosis not present

## 2020-05-17 DIAGNOSIS — H35372 Puckering of macula, left eye: Secondary | ICD-10-CM | POA: Diagnosis not present

## 2020-05-17 DIAGNOSIS — Z961 Presence of intraocular lens: Secondary | ICD-10-CM | POA: Diagnosis not present

## 2020-05-17 DIAGNOSIS — H35033 Hypertensive retinopathy, bilateral: Secondary | ICD-10-CM | POA: Diagnosis not present

## 2020-05-17 MED ORDER — AFLIBERCEPT 2MG/0.05ML IZ SOLN FOR KALEIDOSCOPE
2.0000 mg | INTRAVITREAL | Status: AC | PRN
  Administered 2020-05-17: 2 mg via INTRAVITREAL

## 2020-06-26 NOTE — Progress Notes (Signed)
[facility] - Clinic Note  06/27/2020     CHIEF COMPLAINT Patient presents for Retina Follow Up   HISTORY OF PRESENT ILLNESS: Carolyn Hunter is a 69 y.o. female who presents to the clinic today for:   HPI    Retina Follow Up    Patient presents with  Diabetic Retinopathy.  In both eyes.  This started 6 weeks ago.  Severity is moderate.  I, the attending physician,  performed the HPI with the patient and updated documentation appropriately.          Comments    Patient here for 6 weeks retina follow up for Mod NPDR OU. Patient states vision doing good. No eye pain.        Last edited by Rennis Chris, MD on 06/27/2020  2:23 PM. (History)    pt states vision is doing well  Referring physician: Freddy Finner, NP [address] [place],  [place] [address]  HISTORICAL INFORzMATION:   Selected notes from the MEDICAL RECORD NUMBER Referral from Dr. Fabienne Bruns for DM exam   CURRENT MEDICATIONS: Current Outpatient Medications (Ophthalmic Drugs)  Medication Sig  . Aflibercept (EYLEA IO) Inject 1 Dose into the eye every 30 (thirty) days.  . Bromfenac Sodium (PROLENSA) 0.07 % SOLN Place 1 drop into both eyes in the morning, at noon, in the evening, and at bedtime. (Patient taking differently: Place 1 drop into both eyes in the morning and at bedtime. )  . Bromfenac Sodium 0.09 % SOLN Place 1 drop into both eyes 2 (two) times daily.  . prednisoLONE acetate (PRED FORTE) 1 % ophthalmic suspension Place 1 drop into both eyes 4 (four) times daily.   No current facility-administered medications for this visit. (Ophthalmic Drugs)   Current Outpatient Medications (Other)  Medication Sig  . amLODipine (NORVASC) 5 MG tablet Take 1 tablet (5 mg total) by mouth daily.  Marland Kitchen aspirin EC 81 MG tablet Take 1 tablet (81 mg total) by mouth daily.  . Cholecalciferol (VITAMIN D3) 50 MCG (2000 UT) TABS Take 2,000 Units by mouth daily.  . Liniments (SALONPAS PAIN RELIEF PATCH EX) Place 1  patch onto the skin daily as needed (pain.).  Marland Kitchen metFORMIN (GLUCOPHAGE) 1000 MG tablet Take 1 tablet (1,000 mg total) by mouth 2 (two) times daily with a meal.  . simvastatin (ZOCOR) 40 MG tablet Take 1 tablet (40 mg total) by mouth at bedtime.   No current facility-administered medications for this visit. (Other)      REVIEW OF SYSTEMS: ROS    Positive for: Neurological, Endocrine, Eyes   Negative for: Constitutional, Gastrointestinal, Skin, Genitourinary, Musculoskeletal, HENT, Cardiovascular, Respiratory, Psychiatric, Allergic/Imm, Heme/Lymph   Last edited by Laddie Aquas, COA on 06/27/2020  1:30 PM. (History)       ALLERGIES Allergies  Allergen Reactions  . Ace Inhibitors Cough    PAST MEDICAL HISTORY Past Medical History:  Diagnosis Date  . Anxiety   . Cataract    OS  . Diabetes mellitus without complication (HCC)   . Diabetic retinopathy (HCC)    NPDR OU  . Hyperlipidemia   . Hypertension   . Hypertensive retinopathy    OU  . Tremors of nervous system    "just when I get in a nervous Situation".   Past Surgical History:  Procedure Laterality Date  . CATARACT EXTRACTION W/PHACO Right 10/18/2018   Procedure: CATARACT EXTRACTION PHACO AND INTRAOCULAR LENS PLACEMENT (IOC);  Surgeon: Fabio Pierce, MD;  Location:  [facility];  Service: Ophthalmology;  Laterality: Right;  CDE: 8.11  . CATARACT EXTRACTION W/PHACO Left 01/17/2019   Procedure: CATARACT EXTRACTION PHACO AND INTRAOCULAR LENS PLACEMENT (IOC);  Surgeon: Fabio PierceWrzosek, James, MD;  Location: [facility];  Service: Ophthalmology;  Laterality: Left;  CDE: 6.38  . CESAREAN SECTION     1988  . COLONOSCOPY WITH PROPOFOL N/A 05/04/2020   Procedure: COLONOSCOPY WITH PROPOFOL;  Surgeon: Dolores Frameastaneda Mayorga, Daniel, MD;  Location: [facility];  Service: Gastroenterology;  Laterality: N/A;  830  . EYE SURGERY Right    Cataract extraction OD  . POLYPECTOMY  05/04/2020   Procedure: POLYPECTOMY;  Surgeon: Dolores Frameastaneda Mayorga, Daniel, MD;   Location: [facility];  Service: Gastroenterology;;    FAMILY HISTORY Family History  Problem Relation Age of Onset  . Cancer Mother        breast and colon cancer  . Breast cancer Mother   . Diabetes Brother   . Diabetes Son   . Asthma Father        COPD  . Diabetes Son     SOCIAL HISTORY Social History   Tobacco Use  . Smoking status: Never Smoker  . Smokeless tobacco: Never Used  Vaping Use  . Vaping Use: Never used  Substance Use Topics  . Alcohol use: No  . Drug use: No         OPHTHALMIC EXAM:  Base Eye Exam    Visual Acuity (Snellen - Linear)      Right Left   Dist [place] 20/20 20/20 -1       Tonometry (Tonopen, 1:28 PM)      Right Left   Pressure 17 17       Pupils      Dark Light Shape React APD   Right 3 2 Round Brisk None   Left 3 2 Round Brisk None       Visual Fields (Counting fingers)      Left Right    Full Full       Extraocular Movement      Right Left    Full, Ortho Full, Ortho       Neuro/Psych    Oriented x3: Yes   Mood/Affect: Normal       Dilation    Both eyes: 1.0% Mydriacyl, 2.5% Phenylephrine @ 1:28 PM        Slit Lamp and Fundus Exam    Slit Lamp Exam      Right Left   Lids/Lashes Dermatochalasis - upper lid, Meibomian gland dysfunction Dermatochalasis - upper lid, Meibomian gland dysfunction   Conjunctiva/Sclera White and quiet White and quiet   Cornea Mild Arcus, Well healed temporal cataract wounds, trace Punctate epithelial erosions Mild Arcus, EBMD, 2+ Punctate epithelial erosions, Well healed temporal cataract wounds, Debris in tear film   Anterior Chamber Deep, 1+cell/pigment Deep and quiet   Iris Round and dilated, No NVI, PPM nasally Round and dilated, No NVI   Lens PC IOL in good position PC IOL in good position, trace PCO   Vitreous Vitreous syneresis, vitreous condensations Vitreous syneresis       Fundus Exam      Right Left   Disc mild pallor, Sharp rim mild pallor, Sharp rim   C/D Ratio 0.4  0.4   Macula Flat, good foveal reflex, interval improvement in central cystic changes, mild, focal exudate temporal macula, scattered MA's -- good targets for focal laser, epiretinal membrane Flat, blunted foveal reflex, epiretinal membrane, scattered MA, RPE  mottling and clumping, cluster of MA/exudate temporal macula   Vessels Vascular attenuation, Tortuous, AV crossing changes Vascular attenuation, Tortuous   Periphery Attached, scattered MAs, DBH, exudates inferior to disc - resolved attached, scattered DBH greatest nasally and IN, focal exudates nasal midzone          IMAGING AND PROCEDURES  Imaging and Procedures for 12/10/17  OCT, Retina - OU - Both Eyes       Right Eye Quality was good. Central Foveal Thickness: 313. Progression has improved. Findings include intraretinal hyper-reflective material, intraretinal fluid, no SRF, outer retinal atrophy, normal foveal contour (Interval improvement in cystic changes).   Left Eye Quality was good. Central Foveal Thickness: 386. Progression has been stable. Findings include abnormal foveal contour, intraretinal fluid, intraretinal hyper-reflective material, vitreomacular adhesion , epiretinal membrane (Persistent mild cystic changes temporal macula).   Notes **Images taken, stored on drive**  Diagnosis / Impression:  DME OU (OD>OS) OD: Interval improvement in cystic changes OS: persistent mild cystic changes temporal macula  Clinical management:  See below  Abbreviations: NFP - Normal foveal profile. CME - cystoid macular edema. PED - pigment epithelial detachment. IRF - intraretinal fluid. SRF - subretinal fluid. EZ - ellipsoid zone. ERM - epiretinal membrane. ORA - outer retinal atrophy. ORT - outer retinal tubulation. SRHM - subretinal hyper-reflective material         Focal Laser - OD - Right Eye       LASER PROCEDURE NOTE  Diagnosis:   Diabetic macular edema, RIGHT EYE  Procedure:  Focal laser photocoagulation using  slit lamp laser, RIGHT EYE  Anesthesia:  Topical  Surgeon: Rennis Chris, MD, PhD   Informed consent obtained, operative eye marked, and time out performed prior to initiation of laser.   Lumenis [id number] Focal/Grid laser Power: 70 mW Duration: 50 msec  Spot size: 100 microns  # spots: 32 spots placed to MAs temporal to fovea  Complications: None.  RTC: 4-6 wks  Patient tolerated the procedure well and received written and verbal post-procedure care information/education.                   ASSESSMENT/PLAN:    ICD-10-CM   1. Moderate nonproliferative diabetic retinopathy of both eyes with macular edema associated with type 2 diabetes mellitus (HCC)  V89.3810 Focal Laser - OD - Right Eye  2. Retinal edema  H35.81 OCT, Retina - OU - Both Eyes  3. Epiretinal membrane (ERM) of left eye  H35.372   4. Pseudophakia of right eye  Z96.1   5. Essential hypertension  I10   6. Hypertensive retinopathy of both eyes  H35.033   7. Pseudophakia of both eyes  Z96.1   8. Combined forms of age-related cataract of left eye  H25.812   9. Combined forms of age-related cataract of both eyes  H25.813     1,2. Moderate nonproliferative diabetic retinopathy, both eyes  - Diabetic macular edema, both eyes -- relatively mild  - S/P IVA OD #1 (12.17.18), #2 (01.28.19), #3 (02.27.19), #4 (04.03.19), #5 (05.01.19), #6 (05.27.20), #7 (07.08.20)  - in reviewing her case, there was minimal response to IVA in OCT or BCVA  - S/P IVTA OD #1 (05.29.19), #2 (07.24.19), #3 (09.04.19), #4 (12.04.19), #5 (02.26.20), # 6 (08.05.20), # 7(09.30.20), #8 (12.23.20)  - s/p IVE OD #1 (03.03.21), #2 (03.31.21), #3 (04.28.21), #4 (05.28.21), #5 (08.04.21), #6 (09.08.21), #7 (10.06.21)  - S/P focal laser OS (09.02.20)  - s/p IVE OS #1 (04.28.21), #2 (05.28.21)  -  OCT shows interval improvement in cystic changes OD; OS stable  - BCVA stable at 20/20 OU  - exam shows focal MA temporal to fovea OD -- good targets  for focal laser  - recommend holding IVE OD and doing focal laser OD today, 11.17.21  - pt wishes to proceed  - RBA of procedure discussed, questions answered  - informed consent obtained and signed  - see procedure note  - cont Bromfenac BID OU  - continue PF BID OU  - f/u 4-6 weeks -- DFE/OCT possible injection(s)  3. Epiretinal membrane, left eye  - mild ERM with VMA/VMT component - BCVA 20/20 - asymptomatic, no metamorphopsia - no indication for surgery at this time - monitor for now  4,5. Hypertensive retinopathy OU  - discussed importance of tight BP control  - monitor  6. Pseudophakia OU  - s/p CE/IOL OU (Dr. June Leap, March/June 2020)  - beautiful surgeries, doing well  - monitor   Ophthalmic Meds Ordered this visit:  No orders of the defined types were placed in this encounter.      Return for f/u 4-6 weeks, NPDR OU, DFE, OCT.  There are no Patient Instructions on file for this visit.  This document serves as a record of services personally performed by Karie Chimera, MD, PhD. It was created on their behalf by Herby Abraham, COA, an ophthalmic technician. The creation of this record is the provider's dictation and/or activities during the visit.    Electronically signed by: Herby Abraham, COA 11.16.2021 4:23 PM   This document serves as a record of services personally performed by Karie Chimera, MD, PhD. It was created on their behalf by Glee Arvin. Manson Passey, OA an ophthalmic technician. The creation of this record is the provider's dictation and/or activities during the visit.    Electronically signed by: Glee Arvin. Manson Passey, [place] 11.17.2021 4:23 PM  Karie Chimera, M.D., Ph.D. Diseases & Surgery of the Retina and Vitreous [facility] 06/27/2020   I have reviewed the above documentation for accuracy and completeness, and I agree with the above. Karie Chimera, M.D., Ph.D. 06/27/20 4:23 PM   Abbreviations: M myopia (nearsighted); A  astigmatism; H hyperopia (farsighted); P presbyopia; Mrx spectacle prescription;  CTL contact lenses; OD right eye; OS left eye; OU both eyes  XT exotropia; ET esotropia; PEK punctate epithelial keratitis; PEE punctate epithelial erosions; DES dry eye syndrome; MGD meibomian gland dysfunction; ATs artificial tears; PFAT's preservative free artificial tears; NSC nuclear sclerotic cataract; PSC posterior subcapsular cataract; ERM epi-retinal membrane; PVD posterior vitreous detachment; RD retinal detachment; DM diabetes mellitus; DR diabetic retinopathy; NPDR non-proliferative diabetic retinopathy; PDR proliferative diabetic retinopathy; CSME clinically significant macular edema; DME diabetic macular edema; dbh dot blot hemorrhages; CWS cotton wool spot; POAG primary open angle glaucoma; C/D cup-to-disc ratio; HVF humphrey visual field; GVF goldmann visual field; OCT optical coherence tomography; IOP intraocular pressure; BRVO Branch retinal vein occlusion; CRVO central retinal vein occlusion; CRAO central retinal artery occlusion; BRAO branch retinal artery occlusion; RT retinal tear; SB scleral buckle; PPV pars plana vitrectomy; VH Vitreous hemorrhage; PRP panretinal laser photocoagulation; IVK intravitreal kenalog; VMT vitreomacular traction; MH Macular hole;  NVD neovascularization of the disc; NVE neovascularization elsewhere; AREDS age related eye disease study; ARMD age related macular degeneration; POAG primary open angle glaucoma; EBMD epithelial/anterior basement membrane dystrophy; ACIOL anterior chamber intraocular lens; IOL intraocular lens; PCIOL posterior chamber intraocular lens; Phaco/IOL phacoemulsification with intraocular lens placement; PRK photorefractive keratectomy; LASIK  laser assisted in situ keratomileusis; HTN hypertension; DM diabetes mellitus; COPD chronic obstructive pulmonary disease

## 2020-06-27 ENCOUNTER — Other Ambulatory Visit: Payer: Self-pay

## 2020-06-27 ENCOUNTER — Encounter (INDEPENDENT_AMBULATORY_CARE_PROVIDER_SITE_OTHER): Payer: Self-pay | Admitting: Ophthalmology

## 2020-06-27 ENCOUNTER — Ambulatory Visit (INDEPENDENT_AMBULATORY_CARE_PROVIDER_SITE_OTHER): Payer: PPO | Admitting: Ophthalmology

## 2020-06-27 DIAGNOSIS — I10 Essential (primary) hypertension: Secondary | ICD-10-CM

## 2020-06-27 DIAGNOSIS — Z961 Presence of intraocular lens: Secondary | ICD-10-CM | POA: Diagnosis not present

## 2020-06-27 DIAGNOSIS — E113313 Type 2 diabetes mellitus with moderate nonproliferative diabetic retinopathy with macular edema, bilateral: Secondary | ICD-10-CM | POA: Diagnosis not present

## 2020-06-27 DIAGNOSIS — H35033 Hypertensive retinopathy, bilateral: Secondary | ICD-10-CM

## 2020-06-27 DIAGNOSIS — H25813 Combined forms of age-related cataract, bilateral: Secondary | ICD-10-CM

## 2020-06-27 DIAGNOSIS — H35372 Puckering of macula, left eye: Secondary | ICD-10-CM

## 2020-06-27 DIAGNOSIS — H3581 Retinal edema: Secondary | ICD-10-CM | POA: Diagnosis not present

## 2020-06-27 DIAGNOSIS — H25812 Combined forms of age-related cataract, left eye: Secondary | ICD-10-CM | POA: Diagnosis not present

## 2020-06-30 ENCOUNTER — Other Ambulatory Visit: Payer: Self-pay | Admitting: Family Medicine

## 2020-07-06 ENCOUNTER — Ambulatory Visit: Payer: PPO | Admitting: Family Medicine

## 2020-07-09 DIAGNOSIS — E113313 Type 2 diabetes mellitus with moderate nonproliferative diabetic retinopathy with macular edema, bilateral: Secondary | ICD-10-CM | POA: Diagnosis not present

## 2020-07-09 DIAGNOSIS — H01004 Unspecified blepharitis left upper eyelid: Secondary | ICD-10-CM | POA: Diagnosis not present

## 2020-07-09 DIAGNOSIS — H02834 Dermatochalasis of left upper eyelid: Secondary | ICD-10-CM | POA: Diagnosis not present

## 2020-07-09 DIAGNOSIS — Z961 Presence of intraocular lens: Secondary | ICD-10-CM | POA: Diagnosis not present

## 2020-07-09 DIAGNOSIS — H18413 Arcus senilis, bilateral: Secondary | ICD-10-CM | POA: Diagnosis not present

## 2020-07-09 DIAGNOSIS — H02831 Dermatochalasis of right upper eyelid: Secondary | ICD-10-CM | POA: Diagnosis not present

## 2020-07-09 DIAGNOSIS — H01005 Unspecified blepharitis left lower eyelid: Secondary | ICD-10-CM | POA: Diagnosis not present

## 2020-07-09 DIAGNOSIS — H01002 Unspecified blepharitis right lower eyelid: Secondary | ICD-10-CM | POA: Diagnosis not present

## 2020-07-09 DIAGNOSIS — H01001 Unspecified blepharitis right upper eyelid: Secondary | ICD-10-CM | POA: Diagnosis not present

## 2020-07-13 ENCOUNTER — Other Ambulatory Visit: Payer: Self-pay

## 2020-07-13 ENCOUNTER — Encounter: Payer: Self-pay | Admitting: Family Medicine

## 2020-07-13 ENCOUNTER — Ambulatory Visit (INDEPENDENT_AMBULATORY_CARE_PROVIDER_SITE_OTHER): Payer: PPO | Admitting: Family Medicine

## 2020-07-13 VITALS — BP 154/78 | HR 112 | Temp 98.6°F | Ht 63.0 in | Wt 150.0 lb

## 2020-07-13 DIAGNOSIS — E119 Type 2 diabetes mellitus without complications: Secondary | ICD-10-CM

## 2020-07-13 DIAGNOSIS — I10 Essential (primary) hypertension: Secondary | ICD-10-CM

## 2020-07-13 DIAGNOSIS — E782 Mixed hyperlipidemia: Secondary | ICD-10-CM

## 2020-07-13 LAB — POCT GLYCOSYLATED HEMOGLOBIN (HGB A1C): Hemoglobin A1C: 6.8 % — AB (ref 4.0–5.6)

## 2020-07-13 MED ORDER — METFORMIN HCL 1000 MG PO TABS: 1000.0000 mg | ORAL_TABLET | Freq: Every day | ORAL | 1 refills | Status: DC

## 2020-07-13 NOTE — Assessment & Plan Note (Signed)
A1c better control at 6.8%.  Will decrease Metformin to 1000 mg once daily in the morning.  Encouraged her to continue diet and lifestyle changes.  Continue statin at this time.  We will do recheck of A1c at her annual appointment next year.

## 2020-07-13 NOTE — Patient Instructions (Addendum)
  I appreciate the opportunity to provide you with care for your health and wellness. Today we discussed: overall health   Follow up: July for CPE-fasting labs same day   No labs or referrals today A1c today: 6.8%!  I have reduced you to taking 1,000 mg once daily with breakfast. We will recheck your A1c at the next visit.  HAVE A MERRY CHRISTMAS AND A HAPPY NEW YEAR :)  Please continue to practice social distancing to keep you, your family, and our community safe.  If you must go out, please wear a mask and practice good handwashing.  It was a pleasure to see you and I look forward to continuing to work together on your health and well-being. Please do not hesitate to call the office if you need care or have questions about your care.  Have a wonderful day. With Gratitude, Tereasa Coop, DNP, AGNP-BC

## 2020-07-13 NOTE — Assessment & Plan Note (Signed)
Has white coat syndrome so she fluctuates between borderline controlled uncontrolled.  Continue Norvasc at this time.  Heart healthy diet that is diabetic friendly encouraged.  In addition to 30 to 60 minutes of walking is suggested.

## 2020-07-13 NOTE — Progress Notes (Signed)
Subjective:  Patient ID: Carolyn Hunter, female    DOB: 1951-03-02  Age: 69 y.o. MRN: [id number]  CC:  Chief Complaint  Patient presents with  . Hypertension      HPI  HPI  Carolyn Hunter is a pleasant 69 year old female patient who presents today for follow-up on hypertension for 6 months visit.  She denies having any changes or any concerns to discuss today. She reports that she is not actively structured and exercise.  But is very active in general at home.  She is trying to be adherent to a low-salt heart healthy diabetic friendly diet. Cardiac symptoms: none. Patient denies: chest pain, chest pressure/discomfort, claudication, dyspnea, exertional chest pressure/discomfort, fatigue, irregular heart beat, lower extremity edema, near-syncope, orthopnea, palpitations, paroxysmal nocturnal dyspnea, syncope and tachypnea. Cardiovascular risk factors: advanced age (older than 75 for men, 33 for women), diabetes mellitus, dyslipidemia, hypertension and sedentary lifestyle. Use of agents associated with hypertension: none.  Reports taking all medications as directed and denies side effects.  Diabetes: Denies hypoglycemia.  Denies polydipsia and polyuria.  Last eye exam was within the last month.  Last foot exam in office was May  Last A1c today in office 6.8%. Denies non-healing wounds or rashes. Denies signs of UTI or other infections. Patient follows a low sugar diet and checks feet regularly without concerns. Reports staying hydrated by drinking water.   Today patient denies signs and symptoms of COVID 19 infection including fever, chills, cough, shortness of breath, and headache. Past Medical, Surgical, Social History, Allergies, and Medications have been Reviewed.   Past Medical History:  Diagnosis Date  . Anxiety   . Cataract    OS  . Diabetes mellitus without complication (HCC)   . Diabetic retinopathy (HCC)    NPDR OU  . Hyperlipidemia   . Hypertension   . Hypertensive  retinopathy    OU  . Tremors of nervous system    "just when I get in a nervous Situation".    Current Meds  Medication Sig  . Aflibercept (EYLEA IO) Inject 1 Dose into the eye every 30 (thirty) days.  Marland Kitchen amLODipine (NORVASC) 5 MG tablet Take 1 tablet (5 mg total) by mouth daily.  Marland Kitchen aspirin EC 81 MG tablet Take 1 tablet (81 mg total) by mouth daily.  . Bromfenac Sodium (PROLENSA) 0.07 % SOLN Place 1 drop into both eyes in the morning, at noon, in the evening, and at bedtime. (Patient taking differently: Place 1 drop into both eyes in the morning and at bedtime. )  . Bromfenac Sodium 0.09 % SOLN Place 1 drop into both eyes 2 (two) times daily.  . Cholecalciferol (VITAMIN D3) 50 MCG (2000 UT) TABS Take 2,000 Units by mouth daily.  . Liniments (SALONPAS PAIN RELIEF PATCH EX) Place 1 patch onto the skin daily as needed (pain.).  Marland Kitchen metFORMIN (GLUCOPHAGE) 1000 MG tablet Take 1 tablet (1,000 mg total) by mouth daily with breakfast.  . prednisoLONE acetate (PRED FORTE) 1 % ophthalmic suspension Place 1 drop into both eyes 4 (four) times daily.  . simvastatin (ZOCOR) 40 MG tablet Take 1 tablet (40 mg total) by mouth at bedtime.  . [DISCONTINUED] metFORMIN (GLUCOPHAGE) 1000 MG tablet Take 1 tablet (1,000 mg total) by mouth 2 (two) times daily with a meal.    ROS:  Review of Systems  Constitutional: Negative.   HENT: Negative.   Eyes: Negative.   Respiratory: Negative.   Cardiovascular: Negative.   Gastrointestinal: Negative.  Genitourinary: Negative.   Musculoskeletal: Negative.   Skin: Negative.   Neurological: Negative.   Endo/Heme/Allergies: Negative.   Psychiatric/Behavioral: Negative.      Objective:   Today's Vitals: BP (!) 154/78 (BP Location: Left Arm, Patient Position: Sitting, Cuff Size: Normal)   Pulse (!) 112   Temp 98.6 F (37 C) (Temporal)   Ht 5\' 3"  (1.6 m)   Wt 150 lb (68 kg)   SpO2 98%   BMI 26.57 kg/m  Vitals with BMI 07/13/2020 05/04/2020 05/04/2020  Height 5'  3" - -  Weight 150 lbs - -  BMI 26.58 - -  Systolic 154 167 05/06/2020  Diastolic 78 85 88  Pulse 112 92 92     Physical Exam Vitals and nursing note reviewed.  Constitutional:      Appearance: Normal appearance. She is well-developed, well-groomed and overweight.  HENT:     Head: Normocephalic and atraumatic.     Right Ear: External ear normal.     Left Ear: External ear normal.     Mouth/Throat:     Comments: Mask in place  Eyes:     General:        Right eye: No discharge.        Left eye: No discharge.     Conjunctiva/sclera: Conjunctivae normal.  Cardiovascular:     Rate and Rhythm: Normal rate and regular rhythm.     Pulses: Normal pulses.     Heart sounds: Normal heart sounds.  Pulmonary:     Effort: Pulmonary effort is normal.     Breath sounds: Normal breath sounds.  Musculoskeletal:        General: Normal range of motion.     Cervical back: Normal range of motion and neck supple.  Skin:    General: Skin is warm.  Neurological:     General: No focal deficit present.     Mental Status: She is alert and oriented to person, place, and time.  Psychiatric:        Attention and Perception: Attention normal.        Mood and Affect: Mood normal.        Speech: Speech normal.        Behavior: Behavior normal. Behavior is cooperative.        Thought Content: Thought content normal.        Cognition and Memory: Cognition normal.        Judgment: Judgment normal.     Comments: Good communication and eye contact      Assessment   1. Controlled type 2 diabetes mellitus without complication, without long-term current use of insulin (HCC)   2. Essential hypertension   3. Mixed hyperlipidemia     Tests ordered Orders Placed This Encounter  Procedures  . POCT glycosylated hemoglobin (Hb A1C)     Plan: Please see assessment and plan per problem list above.   Meds ordered this encounter  Medications  . metFORMIN (GLUCOPHAGE) 1000 MG tablet    Sig: Take 1 tablet  (1,000 mg total) by mouth daily with breakfast.    Dispense:  90 tablet    Refill:  1    Order Specific Question:   Supervising Provider    Answer:   [id number]]    Patient to follow-up in July -CPE   Note: This dictation was prepared with Dragon dictation along with smaller phrase technology. Similar sounding words can be transcribed inadequately or may not be corrected upon review. Any  transcriptional errors that result from this process are unintentional.      Freddy Finner, NP

## 2020-07-13 NOTE — Assessment & Plan Note (Signed)
Encouraged to do a heart healthy low-fat diabetic friendly diet.  Updated labs at next appointment

## 2020-07-24 NOTE — Progress Notes (Signed)
[facility] - Clinic Note  07/25/2020     CHIEF COMPLAINT Patient presents for Retina Follow Up   HISTORY OF PRESENT ILLNESS: Carolyn Hunter is a 69 y.o. female who presents to the clinic today for:   HPI    Retina Follow Up    Patient presents with  Diabetic Retinopathy.  In both eyes.  This started 4 weeks ago.  I, the attending physician,  performed the HPI with the patient and updated documentation appropriately.          Comments    Patient here for 4 weeks retina follow up for NPDR OU. Patient states vision doing good. No eye pain.        Last edited by Rennis Chris, MD on 07/28/2020 11:21 PM. (History)    pt states vision has been good, blood sugar and blood pressure have been good as well  Referring physician: Freddy Finner, NP [address] [place],  [place] [address]  HISTORICAL INFORzMATION:   Selected notes from the MEDICAL RECORD NUMBER Referral from Dr. Fabienne Bruns for DM exam   CURRENT MEDICATIONS: Current Outpatient Medications (Ophthalmic Drugs)  Medication Sig  . Aflibercept (EYLEA IO) Inject 1 Dose into the eye every 30 (thirty) days.  . Bromfenac Sodium (PROLENSA) 0.07 % SOLN Place 1 drop into both eyes in the morning, at noon, in the evening, and at bedtime. (Patient taking differently: Place 1 drop into both eyes in the morning and at bedtime. )  . Bromfenac Sodium 0.09 % SOLN Place 1 drop into both eyes 2 (two) times daily.  . prednisoLONE acetate (PRED FORTE) 1 % ophthalmic suspension Place 1 drop into both eyes 4 (four) times daily.   No current facility-administered medications for this visit. (Ophthalmic Drugs)   Current Outpatient Medications (Other)  Medication Sig  . amLODipine (NORVASC) 5 MG tablet Take 1 tablet (5 mg total) by mouth daily.  Marland Kitchen aspirin EC 81 MG tablet Take 1 tablet (81 mg total) by mouth daily.  . Cholecalciferol (VITAMIN D3) 50 MCG (2000 UT) TABS Take 2,000 Units by mouth daily.  . Liniments (SALONPAS  PAIN RELIEF PATCH EX) Place 1 patch onto the skin daily as needed (pain.).  Marland Kitchen metFORMIN (GLUCOPHAGE) 1000 MG tablet Take 1 tablet (1,000 mg total) by mouth daily with breakfast.  . simvastatin (ZOCOR) 40 MG tablet Take 1 tablet (40 mg total) by mouth at bedtime.   No current facility-administered medications for this visit. (Other)      REVIEW OF SYSTEMS: ROS    Positive for: Neurological, Endocrine, Eyes   Negative for: Constitutional, Gastrointestinal, Skin, Genitourinary, Musculoskeletal, HENT, Cardiovascular, Respiratory, Psychiatric, Allergic/Imm, Heme/Lymph   Last edited by Laddie Aquas, COA on 07/25/2020  1:28 PM. (History)       ALLERGIES Allergies  Allergen Reactions  . Ace Inhibitors Cough    PAST MEDICAL HISTORY Past Medical History:  Diagnosis Date  . Anxiety   . Cataract    OS  . Diabetes mellitus without complication (HCC)   . Diabetic retinopathy (HCC)    NPDR OU  . Hyperlipidemia   . Hypertension   . Hypertensive retinopathy    OU  . Tremors of nervous system    "just when I get in a nervous Situation".   Past Surgical History:  Procedure Laterality Date  . CATARACT EXTRACTION W/PHACO Right 10/18/2018   Procedure: CATARACT EXTRACTION PHACO AND INTRAOCULAR LENS PLACEMENT (IOC);  Surgeon: Fabio Pierce, MD;  Location:  [facility];  Service: Ophthalmology;  Laterality: Right;  CDE: 8.11  . CATARACT EXTRACTION W/PHACO Left 01/17/2019   Procedure: CATARACT EXTRACTION PHACO AND INTRAOCULAR LENS PLACEMENT (IOC);  Surgeon: Fabio Pierce, MD;  Location: [facility];  Service: Ophthalmology;  Laterality: Left;  CDE: 6.38  . CESAREAN SECTION     1988  . COLONOSCOPY WITH PROPOFOL N/A 05/04/2020   Procedure: COLONOSCOPY WITH PROPOFOL;  Surgeon: Dolores Frame, MD;  Location: [facility];  Service: Gastroenterology;  Laterality: N/A;  830  . EYE SURGERY Right    Cataract extraction OD  . POLYPECTOMY  05/04/2020   Procedure: POLYPECTOMY;  Surgeon: Dolores Frame, MD;  Location: [facility];  Service: Gastroenterology;;    FAMILY HISTORY Family History  Problem Relation Age of Onset  . Cancer Mother        breast and colon cancer  . Breast cancer Mother   . Diabetes Brother   . Diabetes Son   . Asthma Father        COPD  . Diabetes Son     SOCIAL HISTORY Social History   Tobacco Use  . Smoking status: Never Smoker  . Smokeless tobacco: Never Used  Vaping Use  . Vaping Use: Never used  Substance Use Topics  . Alcohol use: No  . Drug use: No         OPHTHALMIC EXAM:  Base Eye Exam    Visual Acuity (Snellen - Linear)      Right Left   Dist [place] 20/25 20/20 -2   Dist ph [place] 20/20 -2        Tonometry (Tonopen, 1:26 PM)      Right Left   Pressure 19 17       Pupils      Dark Light Shape React APD   Right 3 2 Round Brisk None   Left 3 2 Round Brisk None       Visual Fields (Counting fingers)      Left Right    Full Full       Extraocular Movement      Right Left    Full, Ortho Full, Ortho       Neuro/Psych    Oriented x3: Yes   Mood/Affect: Normal       Dilation    Both eyes: 1.0% Mydriacyl, 2.5% Phenylephrine @ 1:26 PM        Slit Lamp and Fundus Exam    Slit Lamp Exam      Right Left   Lids/Lashes Dermatochalasis - upper lid, Meibomian gland dysfunction Dermatochalasis - upper lid, Meibomian gland dysfunction   Conjunctiva/Sclera White and quiet White and quiet   Cornea Mild Arcus, Well healed temporal cataract wounds, trace Punctate epithelial erosions Mild Arcus, EBMD, 2+ Punctate epithelial erosions, Well healed temporal cataract wounds, Debris in tear film   Anterior Chamber Deep, 1+cell/pigment Deep and quiet   Iris Round and dilated, No NVI, PPM nasally Round and dilated, No NVI   Lens PC IOL in good position PC IOL in good position, trace PCO   Vitreous Vitreous syneresis, vitreous condensations Vitreous syneresis       Fundus Exam      Right Left   Disc mild pallor, Sharp  rim mild pallor, Sharp rim   C/D Ratio 0.4 0.4   Macula Flat, good foveal reflex, interval increase in central cystic changes and focal exudate temporal macula, scattered MA's -- improving, epiretinal membrane Flat, blunted foveal reflex, epiretinal  membrane, scattered MA, RPE mottling and clumping, cluster of MA/exudate temporal macula with mild interval increase in cystic changes   Vessels Vascular attenuation, Tortuous, AV crossing changes Vascular attenuation, Tortuous   Periphery Attached, scattered MAs, DBH, exudates inferior to disc - resolved attached, scattered DBH greatest nasally and IN, focal exudates nasal midzone          IMAGING AND PROCEDURES  Imaging and Procedures for 12/10/17  OCT, Retina - OU - Both Eyes       Right Eye Quality was good. Central Foveal Thickness: 463. Progression has worsened. Findings include intraretinal hyper-reflective material, intraretinal fluid, no SRF, outer retinal atrophy, abnormal foveal contour (Interval increase in cystic changes / edema).   Left Eye Quality was good. Central Foveal Thickness: 406. Progression has worsened. Findings include abnormal foveal contour, intraretinal fluid, intraretinal hyper-reflective material, vitreomacular adhesion , epiretinal membrane (Persistent increase in IRF/diffuse edema).   Notes **Images taken, stored on drive**  Diagnosis / Impression:  DME OU (OD>OS) OD: Interval increase in cystic changes / edema OS: Persistent increase in IRF/diffuse edema  Clinical management:  See below  Abbreviations: NFP - Normal foveal profile. CME - cystoid macular edema. PED - pigment epithelial detachment. IRF - intraretinal fluid. SRF - subretinal fluid. EZ - ellipsoid zone. ERM - epiretinal membrane. ORA - outer retinal atrophy. ORT - outer retinal tubulation. SRHM - subretinal hyper-reflective material         Intravitreal Injection, Pharmacologic Agent - OD - Right Eye       Time Out 07/25/2020. 2:20  PM. Confirmed correct patient, procedure, site, and patient consented.   Anesthesia Topical anesthesia was used. Anesthetic medications included Lidocaine 2%, Proparacaine 0.5%.   Procedure Preparation included 5% betadine to ocular surface, eyelid speculum. A (32g) needle was used.   Injection:  2 mg aflibercept Gretta Cool(EYLEA) SOLN   NDC: L603891061755-005-01, Lot: [id number], Expiration date: 10/09/2020   Route: Intravitreal, Site: Right Eye, Waste: 0.05 mL  Post-op Post injection exam found visual acuity of at least counting fingers. The patient tolerated the procedure well. There were no complications. The patient received written and verbal post procedure care education. Post injection medications were not given.        Intravitreal Injection, Pharmacologic Agent - OS - Left Eye       Time Out 07/25/2020. 2:20 PM. Confirmed correct patient, procedure, site, and patient consented.   Anesthesia Topical anesthesia was used. Anesthetic medications included Lidocaine 2%, Proparacaine 0.5%.   Procedure Preparation included eyelid speculum, 5% betadine to ocular surface. A (32g) needle was used.   Injection:  2 mg aflibercept Gretta Cool(EYLEA) SOLN   NDC: [id number], Lot: [id number], Expiration date: 05/11/2021   Route: Intravitreal, Site: Left Eye, Waste: 0.05 mL  Post-op Post injection exam found visual acuity of at least counting fingers. The patient tolerated the procedure well. There were no complications. The patient received written and verbal post procedure care education.                 ASSESSMENT/PLAN:    ICD-10-CM   1. Moderate nonproliferative diabetic retinopathy of both eyes with macular edema associated with type 2 diabetes mellitus (HCC)  [id number] Intravitreal Injection, Pharmacologic Agent - OD - Right Eye    Intravitreal Injection, Pharmacologic Agent - OS - Left Eye    aflibercept (EYLEA) SOLN 2 mg    aflibercept (EYLEA) SOLN 2 mg  2. Retinal edema  H35.81 OCT, Retina -  OU - Both Eyes  3. Epiretinal membrane (ERM)  of left eye  H35.372   4. Essential hypertension  I10   5. Hypertensive retinopathy of both eyes  H35.033   6. Pseudophakia of both eyes  Z96.1     1,2. Moderate nonproliferative diabetic retinopathy, both eyes  - Diabetic macular edema, both eyes -- relatively mild  - S/P IVA OD #1 (12.17.18), #2 (01.28.19), #3 (02.27.19), #4 (04.03.19), #5 (05.01.19), #6 (05.27.20), #7 (07.08.20)  - in reviewing her case, there was minimal response to IVA in OCT or BCVA  - S/P IVTA OD #1 (05.29.19), #2 (07.24.19), #3 (09.04.19), #4 (12.04.19), #5 (02.26.20), # 6 (08.05.20), # 7(09.30.20), #8 (12.23.20)  - s/p IVE OD #1 (03.03.21), #2 (03.31.21), #3 (04.28.21), #4 (05.28.21), #5 (08.04.21), #6 (09.08.21), #7 (10.06.21)  - s/p IVE OS #1 (04.28.21), #2 (05.28.21)  - S/P focal laser OS (09.02.20)  - s/p focal laser OD (11.17.21)  - OCT shows interval increase in cystic changes / edema OD; OS Persistent increase in IRF/diffuse edema  - BCVA stable at 20/20 OU  - recommend IVE OU (OD #8 and OS #3) today, 12.15.21  - pt wishes to proceed  - RBA of procedure discussed, questions answered  - informed consent obtained and signed  - see procedure note  - cont Bromfenac BID OU  - continue PF BID OU  - f/u 4-6 weeks -- DFE/OCT possible injection(s)  3. Epiretinal membrane, left eye  - mild ERM with VMA/VMT component - BCVA 20/20 - asymptomatic, no metamorphopsia - no indication for surgery at this time - monitor for now  4,5. Hypertensive retinopathy OU  - discussed importance of tight BP control  - monitor  6. Pseudophakia OU  - s/p CE/IOL OU (Dr. June Leap, March/June 2020)  - beautiful surgeries, doing well  - monitor   Ophthalmic Meds Ordered this visit:  Meds ordered this encounter  Medications  . aflibercept (EYLEA) SOLN 2 mg  . aflibercept (EYLEA) SOLN 2 mg       Return for f/u 4-6 weeks, NPDR OU, DFE, OCT.  There are no Patient  Instructions on file for this visit.  This document serves as a record of services personally performed by Karie Chimera, MD, PhD. It was created on their behalf by Annalee Genta, COMT. The creation of this record is the provider's dictation and/or activities during the visit.  Electronically signed by: Annalee Genta, COMT 07/28/20 11:24 PM   This document serves as a record of services personally performed by Karie Chimera, MD, PhD. It was created on their behalf by Glee Arvin. Manson Passey, OA an ophthalmic technician. The creation of this record is the provider's dictation and/or activities during the visit.    Electronically signed by: Glee Arvin. Manson Passey, [place] 12.15.2021 11:24 PM  Karie Chimera, M.D., Ph.D. Diseases & Surgery of the Retina and Vitreous [facility]  I have reviewed the above documentation for accuracy and completeness, and I agree with the above. Karie Chimera, M.D., Ph.D. 07/28/20 11:24 PM  Abbreviations: M myopia (nearsighted); A astigmatism; H hyperopia (farsighted); P presbyopia; Mrx spectacle prescription;  CTL contact lenses; OD right eye; OS left eye; OU both eyes  XT exotropia; ET esotropia; PEK punctate epithelial keratitis; PEE punctate epithelial erosions; DES dry eye syndrome; MGD meibomian gland dysfunction; ATs artificial tears; PFAT's preservative free artificial tears; NSC nuclear sclerotic cataract; PSC posterior subcapsular cataract; ERM epi-retinal membrane; PVD posterior vitreous detachment; RD retinal detachment; DM diabetes mellitus; DR diabetic retinopathy; NPDR non-proliferative diabetic retinopathy;  PDR proliferative diabetic retinopathy; CSME clinically significant macular edema; DME diabetic macular edema; dbh dot blot hemorrhages; CWS cotton wool spot; POAG primary open angle glaucoma; C/D cup-to-disc ratio; HVF humphrey visual field; GVF goldmann visual field; OCT optical coherence tomography; IOP intraocular pressure; BRVO Branch  retinal vein occlusion; CRVO central retinal vein occlusion; CRAO central retinal artery occlusion; BRAO branch retinal artery occlusion; RT retinal tear; SB scleral buckle; PPV pars plana vitrectomy; VH Vitreous hemorrhage; PRP panretinal laser photocoagulation; IVK intravitreal kenalog; VMT vitreomacular traction; MH Macular hole;  NVD neovascularization of the disc; NVE neovascularization elsewhere; AREDS age related eye disease study; ARMD age related macular degeneration; POAG primary open angle glaucoma; EBMD epithelial/anterior basement membrane dystrophy; ACIOL anterior chamber intraocular lens; IOL intraocular lens; PCIOL posterior chamber intraocular lens; Phaco/IOL phacoemulsification with intraocular lens placement; [place] photorefractive keratectomy; LASIK laser assisted in situ keratomileusis; HTN hypertension; DM diabetes mellitus; COPD chronic obstructive pulmonary disease

## 2020-07-25 ENCOUNTER — Other Ambulatory Visit: Payer: Self-pay

## 2020-07-25 ENCOUNTER — Encounter (INDEPENDENT_AMBULATORY_CARE_PROVIDER_SITE_OTHER): Payer: Self-pay | Admitting: Ophthalmology

## 2020-07-25 ENCOUNTER — Ambulatory Visit (INDEPENDENT_AMBULATORY_CARE_PROVIDER_SITE_OTHER): Payer: PPO | Admitting: Ophthalmology

## 2020-07-25 DIAGNOSIS — I10 Essential (primary) hypertension: Secondary | ICD-10-CM

## 2020-07-25 DIAGNOSIS — E113313 Type 2 diabetes mellitus with moderate nonproliferative diabetic retinopathy with macular edema, bilateral: Secondary | ICD-10-CM | POA: Diagnosis not present

## 2020-07-25 DIAGNOSIS — H35372 Puckering of macula, left eye: Secondary | ICD-10-CM

## 2020-07-25 DIAGNOSIS — H3581 Retinal edema: Secondary | ICD-10-CM | POA: Diagnosis not present

## 2020-07-25 DIAGNOSIS — Z961 Presence of intraocular lens: Secondary | ICD-10-CM

## 2020-07-25 DIAGNOSIS — H35033 Hypertensive retinopathy, bilateral: Secondary | ICD-10-CM

## 2020-07-28 ENCOUNTER — Encounter (INDEPENDENT_AMBULATORY_CARE_PROVIDER_SITE_OTHER): Payer: Self-pay | Admitting: Ophthalmology

## 2020-07-28 DIAGNOSIS — E113313 Type 2 diabetes mellitus with moderate nonproliferative diabetic retinopathy with macular edema, bilateral: Secondary | ICD-10-CM | POA: Diagnosis not present

## 2020-07-28 DIAGNOSIS — H3581 Retinal edema: Secondary | ICD-10-CM | POA: Diagnosis not present

## 2020-07-28 MED ORDER — AFLIBERCEPT 2MG/0.05ML IZ SOLN FOR KALEIDOSCOPE
2.0000 mg | INTRAVITREAL | Status: AC | PRN
  Administered 2020-07-28: 2 mg via INTRAVITREAL

## 2020-09-05 ENCOUNTER — Encounter (INDEPENDENT_AMBULATORY_CARE_PROVIDER_SITE_OTHER): Payer: PPO | Admitting: Ophthalmology

## 2020-09-18 ENCOUNTER — Other Ambulatory Visit: Payer: Self-pay | Admitting: Family Medicine

## 2020-09-18 DIAGNOSIS — E782 Mixed hyperlipidemia: Secondary | ICD-10-CM

## 2020-09-18 NOTE — Progress Notes (Signed)
[facility] - Clinic Note  09/19/2020     CHIEF COMPLAINT Patient presents for Retina Follow Up   HISTORY OF PRESENT ILLNESS: Carolyn Hunter is a 70 y.o. female who presents to the clinic today for:   HPI    Retina Follow Up    Patient presents with  Diabetic Retinopathy.  In both eyes.  This started weeks ago.  Severity is moderate.  Duration of weeks.  Since onset it is stable.  I, the attending physician,  performed the HPI with the patient and updated documentation appropriately.          Comments    Pt states vision is the same OU.  Pt denies eye pain or discomfort and denies any new or worsening floaters or fol OU.       Last edited by Rennis Chris, MD on 09/19/2020  3:05 PM. (History)    pt states    Referring physician: Freddy Finner, NP [address] [place],  [place] [address]  HISTORICAL INFORzMATION:   Selected notes from the MEDICAL RECORD NUMBER Referral from Dr. Fabienne Bruns for DM exam   CURRENT MEDICATIONS: Current Outpatient Medications (Ophthalmic Drugs)  Medication Sig  . Aflibercept (EYLEA IO) Inject 1 Dose into the eye every 30 (thirty) days.  . Bromfenac Sodium (PROLENSA) 0.07 % SOLN Place 1 drop into both eyes in the morning, at noon, in the evening, and at bedtime. (Patient taking differently: Place 1 drop into both eyes in the morning and at bedtime. )  . Bromfenac Sodium 0.09 % SOLN Place 1 drop into both eyes 2 (two) times daily.  . prednisoLONE acetate (PRED FORTE) 1 % ophthalmic suspension Place 1 drop into both eyes 4 (four) times daily.   No current facility-administered medications for this visit. (Ophthalmic Drugs)   Current Outpatient Medications (Other)  Medication Sig  . amLODipine (NORVASC) 5 MG tablet Take 1 tablet (5 mg total) by mouth daily.  Marland Kitchen aspirin EC 81 MG tablet Take 1 tablet (81 mg total) by mouth daily.  . Cholecalciferol (VITAMIN D3) 50 MCG (2000 UT) TABS Take 2,000 Units by mouth daily.  . Liniments  (SALONPAS PAIN RELIEF PATCH EX) Place 1 patch onto the skin daily as needed (pain.).  Marland Kitchen metFORMIN (GLUCOPHAGE) 1000 MG tablet Take 1 tablet (1,000 mg total) by mouth daily with breakfast.  . simvastatin (ZOCOR) 40 MG tablet TAKE 1 TABLET BY MOUTH EVERYDAY AT BEDTIME   No current facility-administered medications for this visit. (Other)      REVIEW OF SYSTEMS: ROS    Positive for: Neurological, Endocrine, Eyes   Negative for: Constitutional, Gastrointestinal, Skin, Genitourinary, Musculoskeletal, HENT, Cardiovascular, Respiratory, Psychiatric, Allergic/Imm, Heme/Lymph   Last edited by Corrinne Eagle on 09/19/2020  1:19 PM. (History)       ALLERGIES Allergies  Allergen Reactions  . Ace Inhibitors Cough    PAST MEDICAL HISTORY Past Medical History:  Diagnosis Date  . Anxiety   . Cataract    OS  . Diabetes mellitus without complication (HCC)   . Diabetic retinopathy (HCC)    NPDR OU  . Hyperlipidemia   . Hypertension   . Hypertensive retinopathy    OU  . Tremors of nervous system    "just when I get in a nervous Situation".   Past Surgical History:  Procedure Laterality Date  . CATARACT EXTRACTION W/PHACO Right 10/18/2018   Procedure: CATARACT EXTRACTION PHACO AND INTRAOCULAR LENS PLACEMENT (IOC);  Surgeon: Fabio Pierce,  MD;  Location: [facility];  Service: Ophthalmology;  Laterality: Right;  CDE: 8.11  . CATARACT EXTRACTION W/PHACO Left 01/17/2019   Procedure: CATARACT EXTRACTION PHACO AND INTRAOCULAR LENS PLACEMENT (IOC);  Surgeon: Fabio Pierce, MD;  Location: [facility];  Service: Ophthalmology;  Laterality: Left;  CDE: 6.38  . CESAREAN SECTION     1988  . COLONOSCOPY WITH PROPOFOL N/A 05/04/2020   Procedure: COLONOSCOPY WITH PROPOFOL;  Surgeon: Dolores Frame, MD;  Location: [facility];  Service: Gastroenterology;  Laterality: N/A;  830  . EYE SURGERY Right    Cataract extraction OD  . POLYPECTOMY  05/04/2020   Procedure: POLYPECTOMY;  Surgeon: Dolores Frame, MD;  Location: [facility];  Service: Gastroenterology;;    FAMILY HISTORY Family History  Problem Relation Age of Onset  . Cancer Mother        breast and colon cancer  . Breast cancer Mother   . Diabetes Brother   . Diabetes Son   . Asthma Father        COPD  . Diabetes Son     SOCIAL HISTORY Social History   Tobacco Use  . Smoking status: Never Smoker  . Smokeless tobacco: Never Used  Vaping Use  . Vaping Use: Never used  Substance Use Topics  . Alcohol use: No  . Drug use: No         OPHTHALMIC EXAM:  Base Eye Exam    Visual Acuity (Snellen - Linear)      Right Left   Dist Ross 20/20 -2 20/20 -1       Tonometry (Tonopen, 1:22 PM)      Right Left   Pressure 16 14       Pupils      Dark Light Shape React APD   Right 2 1 Round Minimal 0   Left 2 1 Round Minimal 0       Visual Fields      Left Right    Full Full       Extraocular Movement      Right Left    Full Full       Neuro/Psych    Oriented x3: Yes   Mood/Affect: Normal       Dilation    Both eyes: 1.0% Mydriacyl, 2.5% Phenylephrine @ 1:22 PM        Slit Lamp and Fundus Exam    Slit Lamp Exam      Right Left   Lids/Lashes Dermatochalasis - upper lid, Meibomian gland dysfunction Dermatochalasis - upper lid, Meibomian gland dysfunction   Conjunctiva/Sclera White and quiet White and quiet   Cornea Mild Arcus, Well healed temporal cataract wounds, trace Punctate epithelial erosions Mild Arcus, EBMD, 2+ Punctate epithelial erosions, Well healed temporal cataract wounds, Debris in tear film   Anterior Chamber Deep, 1+cell/pigment Deep and quiet   Iris Round and dilated, No NVI, PPM nasally Round and dilated, No NVI   Lens PC IOL in good position PC IOL in good position, trace PCO   Vitreous Vitreous syneresis, vitreous condensations Vitreous syneresis       Fundus Exam      Right Left   Disc mild pallor, Sharp rim mild pallor, Sharp rim   C/D Ratio 0.4 0.4    Macula Flat, good foveal reflex, interval improvement in central cystic changes, focal exudate temporal macula, scattered MA's -- improving, epiretinal membrane Flat, blunted foveal reflex, epiretinal membrane, scattered MA, RPE mottling and clumping, cluster of  MA/exudate temporal macula with mild interval improvement in cystic changes   Vessels Vascular attenuation, Tortuous, AV crossing changes Vascular attenuation, Tortuous   Periphery Attached, scattered MAs, DBH, exudates inferior to disc - resolved attached, scattered DBH greatest nasally and IN, focal exudates nasal midzone          IMAGING AND PROCEDURES  Imaging and Procedures for 12/10/17  OCT, Retina - OU - Both Eyes       Right Eye Quality was good. Central Foveal Thickness: 337. Progression has improved. Findings include intraretinal hyper-reflective material, intraretinal fluid, no SRF, outer retinal atrophy, abnormal foveal contour (Interval improvement in IRF and foveal contour).   Left Eye Quality was good. Central Foveal Thickness: 380. Progression has improved. Findings include abnormal foveal contour, intraretinal fluid, intraretinal hyper-reflective material, vitreomacular adhesion , epiretinal membrane (Interval improvement in IRF temporal macula).   Notes **Images taken, stored on drive**  Diagnosis / Impression:  DME OU (OD>OS) OD: Interval improvement in IRF and foveal contour OS: Interval improvement in IRF temporal macula  Clinical management:  See below  Abbreviations: NFP - Normal foveal profile. CME - cystoid macular edema. PED - pigment epithelial detachment. IRF - intraretinal fluid. SRF - subretinal fluid. EZ - ellipsoid zone. ERM - epiretinal membrane. ORA - outer retinal atrophy. ORT - outer retinal tubulation. SRHM - subretinal hyper-reflective material         Intravitreal Injection, Pharmacologic Agent - OD - Right Eye       Time Out 09/19/2020. 1:41 PM. Confirmed correct patient,  procedure, site, and patient consented.   Anesthesia Topical anesthesia was used. Anesthetic medications included Lidocaine 2%, Proparacaine 0.5%.   Procedure Preparation included 5% betadine to ocular surface, eyelid speculum. A (32g) needle was used.   Injection:  2 mg aflibercept Gretta Cool) SOLN   NDC: L6038910, Lot: [id number], Expiration date: 01/07/2021   Route: Intravitreal, Site: Right Eye, Waste: 0.05 mL  Post-op Post injection exam found visual acuity of at least counting fingers. The patient tolerated the procedure well. There were no complications. The patient received written and verbal post procedure care education. Post injection medications were not given.        Intravitreal Injection, Pharmacologic Agent - OS - Left Eye       Time Out 09/19/2020. 1:42 PM. Confirmed correct patient, procedure, site, and patient consented.   Anesthesia Topical anesthesia was used. Anesthetic medications included Lidocaine 2%, Proparacaine 0.5%.   Procedure Preparation included eyelid speculum, 5% betadine to ocular surface. A (32g) needle was used.   Injection:  2 mg aflibercept Gretta Cool) SOLN   NDC: L6038910, Lot: [id number], Expiration date: 01/07/2021   Route: Intravitreal, Site: Left Eye, Waste: 0.05 mL  Post-op Post injection exam found visual acuity of at least counting fingers. The patient tolerated the procedure well. There were no complications. The patient received written and verbal post procedure care education.                 ASSESSMENT/PLAN:    ICD-10-CM   1. Moderate nonproliferative diabetic retinopathy of both eyes with macular edema associated with type 2 diabetes mellitus (HCC)  K35.4656 Intravitreal Injection, Pharmacologic Agent - OD - Right Eye    Intravitreal Injection, Pharmacologic Agent - OS - Left Eye    aflibercept (EYLEA) SOLN 2 mg    aflibercept (EYLEA) SOLN 2 mg  2. Retinal edema  H35.81 OCT, Retina - OU - Both Eyes  3. Epiretinal  membrane (ERM) of left eye  H35.372  4. Essential hypertension  I10   5. Hypertensive retinopathy of both eyes  H35.033   6. Pseudophakia of both eyes  Z96.1     1,2. Moderate nonproliferative diabetic retinopathy, both eyes  - Diabetic macular edema, both eyes -- relatively mild  - S/P IVA OD #1 (12.17.18), #2 (01.28.19), #3 (02.27.19), #4 (04.03.19), #5 (05.01.19), #6 (05.27.20), #7 (07.08.20)  - in reviewing her case, there was minimal response to IVA in OCT or BCVA  - S/P IVTA OD #1 (05.29.19), #2 (07.24.19), #3 (09.04.19), #4 (12.04.19), #5 (02.26.20), # 6 (08.05.20), # 7(09.30.20), #8 (12.23.20)  - s/p IVE OD #1 (03.03.21), #2 (03.31.21), #3 (04.28.21), #4 (05.28.21), #5 (08.04.21), #6 (09.08.21), #7 (10.06.21), #8 (12.15.21)  - s/p IVE OS #1 (04.28.21), #2 (05.28.21), #3 (12.15.21)  - S/P focal laser OS (09.02.20)  - s/p focal laser OD (11.17.21)  - OCT shows OD: Interval improvement in IRF and foveal contour; OS: Interval improvement in IRF temporal macula  - BCVA stable at 20/20 OU  - recommend IVE OU (OD #9 and OS #4) today, 02.09.22  - pt wishes to proceed  - RBA of procedure discussed, questions answered  - informed consent obtained and signed  - see procedure note  - cont Bromfenac BID OU  - continue PF BID OU  - f/u 9-10 weeks -- DFE/OCT possible injection(s)  3. Epiretinal membrane, left eye  - mild ERM with VMA/VMT component - BCVA 20/20 - asymptomatic, no metamorphopsia - no indication for surgery at this time - monitor for now  4,5. Hypertensive retinopathy OU  - discussed importance of tight BP control  - monitor  6. Pseudophakia OU  - s/p CE/IOL OU (Dr. June Leap, March/June 2020)  - beautiful surgeries, doing well  - monitor   Ophthalmic Meds Ordered this visit:  Meds ordered this encounter  Medications  . aflibercept (EYLEA) SOLN 2 mg  . aflibercept (EYLEA) SOLN 2 mg       Return for f/u 9-10 weeks, NPDR OU, DFE, OCT, Possible Injxn.  There  are no Patient Instructions on file for this visit.  This document serves as a record of services personally performed by Karie Chimera, MD, PhD. It was created on their behalf by Annalee Genta, COMT. The creation of this record is the provider's dictation and/or activities during the visit.  Electronically signed by: Annalee Genta, COMT 09/19/20 3:07 PM   This document serves as a record of services personally performed by Karie Chimera, MD, PhD. It was created on their behalf by Glee Arvin. Manson Passey, OA an ophthalmic technician. The creation of this record is the provider's dictation and/or activities during the visit.    Electronically signed by: Glee Arvin. Manson Passey, [place] 02.09.2022 3:07 PM  Karie Chimera, M.D., Ph.D. Diseases & Surgery of the Retina and Vitreous [facility]  I have reviewed the above documentation for accuracy and completeness, and I agree with the above. Karie Chimera, M.D., Ph.D. 09/19/20 3:07 PM   Abbreviations: M myopia (nearsighted); A astigmatism; H hyperopia (farsighted); P presbyopia; Mrx spectacle prescription;  CTL contact lenses; OD right eye; OS left eye; OU both eyes  XT exotropia; ET esotropia; PEK punctate epithelial keratitis; PEE punctate epithelial erosions; DES dry eye syndrome; MGD meibomian gland dysfunction; ATs artificial tears; PFAT's preservative free artificial tears; NSC nuclear sclerotic cataract; PSC posterior subcapsular cataract; ERM epi-retinal membrane; PVD posterior vitreous detachment; RD retinal detachment; DM diabetes mellitus; DR diabetic retinopathy; NPDR non-proliferative diabetic  retinopathy; PDR proliferative diabetic retinopathy; CSME clinically significant macular edema; DME diabetic macular edema; dbh dot blot hemorrhages; CWS cotton wool spot; POAG primary open angle glaucoma; C/D cup-to-disc ratio; HVF humphrey visual field; GVF goldmann visual field; OCT optical coherence tomography; IOP intraocular pressure;  BRVO Branch retinal vein occlusion; CRVO central retinal vein occlusion; CRAO central retinal artery occlusion; BRAO branch retinal artery occlusion; RT retinal tear; SB scleral buckle; PPV pars plana vitrectomy; VH Vitreous hemorrhage; PRP panretinal laser photocoagulation; IVK intravitreal kenalog; VMT vitreomacular traction; MH Macular hole;  NVD neovascularization of the disc; NVE neovascularization elsewhere; AREDS age related eye disease study; ARMD age related macular degeneration; POAG primary open angle glaucoma; EBMD epithelial/anterior basement membrane dystrophy; ACIOL anterior chamber intraocular lens; IOL intraocular lens; PCIOL posterior chamber intraocular lens; Phaco/IOL phacoemulsification with intraocular lens placement; PRK photorefractive keratectomy; LASIK laser assisted in situ keratomileusis; HTN hypertension; DM diabetes mellitus; COPD chronic obstructive pulmonary disease

## 2020-09-19 ENCOUNTER — Other Ambulatory Visit: Payer: Self-pay

## 2020-09-19 ENCOUNTER — Encounter (INDEPENDENT_AMBULATORY_CARE_PROVIDER_SITE_OTHER): Payer: Self-pay | Admitting: Ophthalmology

## 2020-09-19 ENCOUNTER — Ambulatory Visit (INDEPENDENT_AMBULATORY_CARE_PROVIDER_SITE_OTHER): Payer: PPO | Admitting: Ophthalmology

## 2020-09-19 DIAGNOSIS — E113313 Type 2 diabetes mellitus with moderate nonproliferative diabetic retinopathy with macular edema, bilateral: Secondary | ICD-10-CM | POA: Diagnosis not present

## 2020-09-19 DIAGNOSIS — I10 Essential (primary) hypertension: Secondary | ICD-10-CM | POA: Diagnosis not present

## 2020-09-19 DIAGNOSIS — H35372 Puckering of macula, left eye: Secondary | ICD-10-CM

## 2020-09-19 DIAGNOSIS — H3581 Retinal edema: Secondary | ICD-10-CM | POA: Diagnosis not present

## 2020-09-19 DIAGNOSIS — Z961 Presence of intraocular lens: Secondary | ICD-10-CM

## 2020-09-19 DIAGNOSIS — H35033 Hypertensive retinopathy, bilateral: Secondary | ICD-10-CM | POA: Diagnosis not present

## 2020-09-19 MED ORDER — AFLIBERCEPT 2MG/0.05ML IZ SOLN FOR KALEIDOSCOPE
2.0000 mg | INTRAVITREAL | Status: AC | PRN
  Administered 2020-09-19: 2 mg via INTRAVITREAL

## 2020-11-06 ENCOUNTER — Other Ambulatory Visit (INDEPENDENT_AMBULATORY_CARE_PROVIDER_SITE_OTHER): Payer: Self-pay | Admitting: Ophthalmology

## 2020-11-20 NOTE — Progress Notes (Addendum)
[facility] - Clinic Note  11/21/2020     CHIEF COMPLAINT Patient presents for Retina Follow Up   HISTORY OF PRESENT ILLNESS: Carolyn Hunter is a 70 y.o. female who presents to the clinic today for:   HPI    Retina Follow Up    Patient presents with  Diabetic Retinopathy.  In both eyes.  This started 9 weeks ago.  I, the attending physician,  performed the HPI with the patient and updated documentation appropriately.          Comments    Patient here for 9 weeks retina follow up for NPDR OU. Patient states vision doing good. No eye pain.       Last edited by Rennis Chris, MD on 11/21/2020  2:08 PM. (History)    pt states she has been eating better and exercising more, she states her floaters are persistent, no change in [place]   Referring physician: Freddy Finner, NP [address] [place],  [place] [address]  HISTORICAL INFORzMATION:   Selected notes from the MEDICAL RECORD NUMBER Referral from Dr. Fabienne Bruns for DM exam   CURRENT MEDICATIONS: Current Outpatient Medications (Ophthalmic Drugs)  Medication Sig  . Aflibercept (EYLEA IO) Inject 1 Dose into the eye every 30 (thirty) days.  . Bromfenac Sodium (PROLENSA) 0.07 % SOLN Place 1 drop into both eyes in the morning, at noon, in the evening, and at bedtime. (Patient taking differently: Place 1 drop into both eyes in the morning and at bedtime. )  . Bromfenac Sodium 0.09 % SOLN Place 1 drop into both eyes 2 (two) times daily.  . prednisoLONE acetate (PRED FORTE) 1 % ophthalmic suspension Place 1 drop into both eyes 4 (four) times daily.   No current facility-administered medications for this visit. (Ophthalmic Drugs)   Current Outpatient Medications (Other)  Medication Sig  . amLODipine (NORVASC) 5 MG tablet Take 1 tablet (5 mg total) by mouth daily.  Marland Kitchen aspirin EC 81 MG tablet Take 1 tablet (81 mg total) by mouth daily.  . Cholecalciferol (VITAMIN D3) 50 MCG (2000 UT) TABS Take 2,000 Units by mouth  daily.  . Liniments (SALONPAS PAIN RELIEF PATCH EX) Place 1 patch onto the skin daily as needed (pain.).  Marland Kitchen metFORMIN (GLUCOPHAGE) 1000 MG tablet Take 1 tablet (1,000 mg total) by mouth daily with breakfast.  . simvastatin (ZOCOR) 40 MG tablet TAKE 1 TABLET BY MOUTH EVERYDAY AT BEDTIME   No current facility-administered medications for this visit. (Other)      REVIEW OF SYSTEMS: ROS    Positive for: Neurological, Endocrine, Eyes   Negative for: Constitutional, Gastrointestinal, Skin, Genitourinary, Musculoskeletal, HENT, Cardiovascular, Respiratory, Psychiatric, Allergic/Imm, Heme/Lymph   Last edited by Laddie Aquas, COA on 11/21/2020  1:21 PM. (History)       ALLERGIES Allergies  Allergen Reactions  . Ace Inhibitors Cough    PAST MEDICAL HISTORY Past Medical History:  Diagnosis Date  . Anxiety   . Cataract    OS  . Diabetes mellitus without complication (HCC)   . Diabetic retinopathy (HCC)    NPDR OU  . Hyperlipidemia   . Hypertension   . Hypertensive retinopathy    OU  . Tremors of nervous system    "just when I get in a nervous Situation".   Past Surgical History:  Procedure Laterality Date  . CATARACT EXTRACTION W/PHACO Right 10/18/2018   Procedure: CATARACT EXTRACTION PHACO AND INTRAOCULAR LENS PLACEMENT (IOC);  Surgeon: Fabio Pierce,  MD;  Location: [facility];  Service: Ophthalmology;  Laterality: Right;  CDE: 8.11  . CATARACT EXTRACTION W/PHACO Left 01/17/2019   Procedure: CATARACT EXTRACTION PHACO AND INTRAOCULAR LENS PLACEMENT (IOC);  Surgeon: Fabio Pierce, MD;  Location: [facility];  Service: Ophthalmology;  Laterality: Left;  CDE: 6.38  . CESAREAN SECTION     1988  . COLONOSCOPY WITH PROPOFOL N/A 05/04/2020   Procedure: COLONOSCOPY WITH PROPOFOL;  Surgeon: Dolores Frame, MD;  Location: [facility];  Service: Gastroenterology;  Laterality: N/A;  830  . EYE SURGERY Right    Cataract extraction OD  . POLYPECTOMY  05/04/2020   Procedure:  POLYPECTOMY;  Surgeon: Dolores Frame, MD;  Location: [facility];  Service: Gastroenterology;;    FAMILY HISTORY Family History  Problem Relation Age of Onset  . Cancer Mother        breast and colon cancer  . Breast cancer Mother   . Diabetes Brother   . Diabetes Son   . Asthma Father        COPD  . Diabetes Son     SOCIAL HISTORY Social History   Tobacco Use  . Smoking status: Never Smoker  . Smokeless tobacco: Never Used  Vaping Use  . Vaping Use: Never used  Substance Use Topics  . Alcohol use: No  . Drug use: No         OPHTHALMIC EXAM:  Base Eye Exam    Visual Acuity (Snellen - Linear)      Right Left   Dist [place] 20/20 -2 20/20 -2       Tonometry (Tonopen, 1:18 PM)      Right Left   Pressure 13 13       Pupils      Dark Light Shape React APD   Right 2 1 Round Minimal None   Left 2 1 Round Minimal None       Visual Fields (Counting fingers)      Left Right    Full Full       Extraocular Movement      Right Left    Full Full       Neuro/Psych    Oriented x3: Yes   Mood/Affect: Normal       Dilation    Both eyes: 1.0% Mydriacyl, 2.5% Phenylephrine @ 1:18 PM        Slit Lamp and Fundus Exam    Slit Lamp Exam      Right Left   Lids/Lashes Dermatochalasis - upper lid, Meibomian gland dysfunction Dermatochalasis - upper lid, Meibomian gland dysfunction   Conjunctiva/Sclera White and quiet White and quiet   Cornea Mild Arcus, Well healed temporal cataract wounds, trace Punctate epithelial erosions Mild Arcus, EBMD, trace Punctate epithelial erosions, Well healed temporal cataract wounds, mild Debris in tear film   Anterior Chamber Deep, 1+cell/pigment Deep and quiet   Iris Round and dilated, No NVI, PPM nasally Round and dilated, No NVI   Lens PC IOL in good position PC IOL in good position, trace PCO   Vitreous Vitreous syneresis, mild vitreous condensations, Posterior vitreous detachment, old, white VH settled inferiorly  Vitreous syneresis       Fundus Exam      Right Left   Disc mild pallor, Sharp rim mild pallor, Sharp rim   C/D Ratio 0.4 0.4   Macula Flat, good foveal reflex, IRH/edema - improved, focal cluster of exudate temporal macula, epiretinal membrane Flat, blunted foveal reflex, epiretinal membrane, scattered  MA, RPE mottling and clumping, cluster of MA/exudate temporal macula with mild interval improvement in cystic changes   Vessels Vascular attenuation, Tortuous, AV crossing changes Vascular attenuation, Tortuous   Periphery Attached, scattered MAs, DBH, exudates inferior to disc - resolved, inferior retinal partially obscured by vitreous condensations / old VH attached, scattered DBH greatest nasally and IN, focal exudates nasal midzone          IMAGING AND PROCEDURES  Imaging and Procedures for 12/10/17  OCT, Retina - OU - Both Eyes       Right Eye Quality was good. Central Foveal Thickness: 302. Progression has improved. Findings include intraretinal hyper-reflective material, no SRF, outer retinal atrophy, normal foveal contour, no IRF (Interval improvement in IRF, interval release of partial PVD, mild vitreous opacities).   Left Eye Quality was good. Central Foveal Thickness: 361. Progression has improved. Findings include abnormal foveal contour, intraretinal hyper-reflective material, vitreomacular adhesion , epiretinal membrane, no IRF, no SRF (Interval improvement in IRF, mild persistent IRHM).   Notes **Images taken, stored on drive**  Diagnosis / Impression:  DME OU (OD>OS) OD: Interval improvement in IRF, interval release of partial PVD, mild vitreous opacities OS: Interval improvement in IRF, mild persistent IRHM  Clinical management:  See below  Abbreviations: NFP - Normal foveal profile. CME - cystoid macular edema. PED - pigment epithelial detachment. IRF - intraretinal fluid. SRF - subretinal fluid. EZ - ellipsoid zone. ERM - epiretinal membrane. ORA - outer  retinal atrophy. ORT - outer retinal tubulation. SRHM - subretinal hyper-reflective material                  ASSESSMENT/PLAN:    ICD-10-CM   1. Moderate nonproliferative diabetic retinopathy of both eyes with macular edema associated with type 2 diabetes mellitus (HCC)  [id number]   2. Retinal edema  H35.81 OCT, Retina - OU - Both Eyes  3. Posterior vitreous detachment of right eye  H43.811   4. Epiretinal membrane (ERM) of left eye  H35.372   5. Essential hypertension  I10   6. Hypertensive retinopathy of both eyes  H35.033   7. Pseudophakia of both eyes  Z96.1     1,2. Moderate nonproliferative diabetic retinopathy, both eyes  - Diabetic macular edema, both eyes -- relatively mild  - S/P IVA OD #1 (12.17.18), #2 (01.28.19), #3 (02.27.19), #4 (04.03.19), #5 (05.01.19), #6 (05.27.20), #7 (07.08.20)  - in reviewing her case, there was minimal response to IVA in OCT or BCVA  - S/P IVTA OD #1 (05.29.19), #2 (07.24.19), #3 (09.04.19), #4 (12.04.19), #5 (02.26.20), # 6 (08.05.20), # 7(09.30.20), #8 (12.23.20)  - s/p IVE OD #1 (03.03.21), #2 (03.31.21), #3 (04.28.21), #4 (05.28.21), #5 (08.04.21), #6 (09.08.21), #7 (10.06.21), #8 (12.15.21), #9 (02.09.22)  - s/p IVE OS #1 (04.28.21), #2 (05.28.21), #3 (12.15.21), #4 (02.09.22)  - S/P focal laser OS (09.02.20)  - s/p focal laser OD (11.17.21)  - OCT shows OD: Interval improvement in IRF, interval release of partial PVD, mild vitreous opacities; OS: Interval improvement in IRF, mild persistent IRHM  - BCVA stable at 20/20 OU  - recommend holding off on both injections today  - pt in agreement  - cont Bromfenac BID OU  - continue PF BID OU  - f/u 4 weeks -- DFE/OCT possible injection(s)  3. PVD / vitreous syneresis OD  - Discussed findings and prognosis  - No RT or RD on 360 scleral depressed exam  - Reviewed s/s of RT/RD  - Strict return precautions for  any such RT/RD signs/symptoms  - f/u in 4 wks -- DFE/OCT  4. Epiretinal  membrane, left eye  - mild ERM with VMA/VMT component - BCVA 20/20 - asymptomatic, no metamorphopsia - no indication for surgery at this time - monitor for now  5,6. Hypertensive retinopathy OU  - discussed importance of tight BP control  - monitor  7. Pseudophakia OU  - s/p CE/IOL OU (Dr. June Leap, March/June 2020)  - beautiful surgeries, doing well  - monitor   Ophthalmic Meds Ordered this visit:  No orders of the defined types were placed in this encounter.      Return in about 4 weeks (around 12/19/2020) for f/u PVD OD, DFE, OCT.  There are no Patient Instructions on file for this visit.  This document serves as a record of services personally performed by Karie Chimera, MD, PhD. It was created on their behalf by Annalee Genta, COMT. The creation of this record is the provider's dictation and/or activities during the visit.  Electronically signed by: Annalee Genta, COMT 11/22/20 9:49 PM   This document serves as a record of services personally performed by Karie Chimera, MD, PhD. It was created on their behalf by Glee Arvin. Manson Passey, OA an ophthalmic technician. The creation of this record is the provider's dictation and/or activities during the visit.    Electronically signed by: Glee Arvin. Manson Passey, OA @TODAY @ 9:49 PM  , M.D., Ph.D. Diseases & Surgery of the Retina and Vitreous [facility]  I have reviewed the above documentation for accuracy and completeness, and I agree with the above. [facility], M.D., Ph.D. 11/22/20 9:49 PM   Abbreviations: M myopia (nearsighted); A astigmatism; H hyperopia (farsighted); P presbyopia; Mrx spectacle prescription;  CTL contact lenses; OD right eye; OS left eye; OU both eyes  XT exotropia; ET esotropia; PEK punctate epithelial keratitis; PEE punctate epithelial erosions; DES dry eye syndrome; MGD meibomian gland dysfunction; ATs artificial tears; PFAT's preservative free artificial tears; NSC  nuclear sclerotic cataract; PSC posterior subcapsular cataract; ERM epi-retinal membrane; PVD posterior vitreous detachment; RD retinal detachment; DM diabetes mellitus; DR diabetic retinopathy; NPDR non-proliferative diabetic retinopathy; PDR proliferative diabetic retinopathy; CSME clinically significant macular edema; DME diabetic macular edema; dbh dot blot hemorrhages; CWS cotton wool spot; POAG primary open angle glaucoma; C/D cup-to-disc ratio; HVF humphrey visual field; GVF goldmann visual field; OCT optical coherence tomography; IOP intraocular pressure; BRVO Branch retinal vein occlusion; CRVO central retinal vein occlusion; CRAO central retinal artery occlusion; BRAO branch retinal artery occlusion; RT retinal tear; SB scleral buckle; PPV pars plana vitrectomy; VH Vitreous hemorrhage; PRP panretinal laser photocoagulation; IVK intravitreal kenalog; VMT vitreomacular traction; MH Macular hole;  NVD neovascularization of the disc; NVE neovascularization elsewhere; AREDS age related eye disease study; ARMD age related macular degeneration; POAG primary open angle glaucoma; EBMD epithelial/anterior basement membrane dystrophy; ACIOL anterior chamber intraocular lens; IOL intraocular lens; PCIOL posterior chamber intraocular lens; Phaco/IOL phacoemulsification with intraocular lens placement; PRK photorefractive keratectomy; LASIK laser assisted in situ keratomileusis; HTN hypertension; DM diabetes mellitus; COPD chronic obstructive pulmonary disease

## 2020-11-21 ENCOUNTER — Other Ambulatory Visit: Payer: Self-pay

## 2020-11-21 ENCOUNTER — Encounter (INDEPENDENT_AMBULATORY_CARE_PROVIDER_SITE_OTHER): Payer: Self-pay | Admitting: Ophthalmology

## 2020-11-21 ENCOUNTER — Ambulatory Visit (INDEPENDENT_AMBULATORY_CARE_PROVIDER_SITE_OTHER): Payer: PPO | Admitting: Ophthalmology

## 2020-11-21 DIAGNOSIS — H3581 Retinal edema: Secondary | ICD-10-CM

## 2020-11-21 DIAGNOSIS — H35033 Hypertensive retinopathy, bilateral: Secondary | ICD-10-CM | POA: Diagnosis not present

## 2020-11-21 DIAGNOSIS — Z961 Presence of intraocular lens: Secondary | ICD-10-CM | POA: Diagnosis not present

## 2020-11-21 DIAGNOSIS — I10 Essential (primary) hypertension: Secondary | ICD-10-CM | POA: Diagnosis not present

## 2020-11-21 DIAGNOSIS — H43811 Vitreous degeneration, right eye: Secondary | ICD-10-CM | POA: Diagnosis not present

## 2020-11-21 DIAGNOSIS — H35372 Puckering of macula, left eye: Secondary | ICD-10-CM | POA: Diagnosis not present

## 2020-11-21 DIAGNOSIS — E113313 Type 2 diabetes mellitus with moderate nonproliferative diabetic retinopathy with macular edema, bilateral: Secondary | ICD-10-CM

## 2020-12-06 ENCOUNTER — Other Ambulatory Visit: Payer: Self-pay | Admitting: Family Medicine

## 2020-12-18 ENCOUNTER — Telehealth: Payer: Self-pay | Admitting: Family Medicine

## 2020-12-18 NOTE — Telephone Encounter (Signed)
Left message for patient to call back and schedule Medicare Annual Wellness Visit (AWV) either virtually or in office.  AWV-I PER [place] 04/12/19   please schedule at anytime with [address] [facility]  health coach  This should be a 40 minute visit.

## 2020-12-18 NOTE — Progress Notes (Signed)
[facility] - Clinic Note  12/19/2020     CHIEF COMPLAINT Patient presents for Retina Follow Up   HISTORY OF PRESENT ILLNESS: Carolyn Hunter is a 70 y.o. female who presents to the clinic today for:   HPI    Retina Follow Up    Patient presents with  Diabetic Retinopathy.  In both eyes.  This started weeks ago.  Severity is moderate.  Duration of weeks.  Since onset it is stable.  I, the attending physician,  performed the HPI with the patient and updated documentation appropriately.          Comments    Pt states her vision is the same OU.  Pt denies eye pain or discomfort and denies any new or worsening floaters or fol OU.       Last edited by Rennis Chris, MD on 12/19/2020  2:38 PM. (History)    pt states    Referring physician: Freddy Finner, NP [address] [place],  [place] [address]  HISTORICAL INFORzMATION:   Selected notes from the MEDICAL RECORD NUMBER Referral from Dr. Fabienne Bruns for DM exam   CURRENT MEDICATIONS: Current Outpatient Medications (Ophthalmic Drugs)  Medication Sig  . Aflibercept (EYLEA IO) Inject 1 Dose into the eye every 30 (thirty) days.  . Bromfenac Sodium (PROLENSA) 0.07 % SOLN Place 1 drop into both eyes in the morning, at noon, in the evening, and at bedtime. (Patient taking differently: Place 1 drop into both eyes in the morning and at bedtime. )  . Bromfenac Sodium 0.09 % SOLN Place 1 drop into both eyes 2 (two) times daily.  . prednisoLONE acetate (PRED FORTE) 1 % ophthalmic suspension Place 1 drop into both eyes 4 (four) times daily.   No current facility-administered medications for this visit. (Ophthalmic Drugs)   Current Outpatient Medications (Other)  Medication Sig  . amLODipine (NORVASC) 5 MG tablet TAKE 1 TABLET BY MOUTH EVERY DAY  . aspirin EC 81 MG tablet Take 1 tablet (81 mg total) by mouth daily.  . Cholecalciferol (VITAMIN D3) 50 MCG (2000 UT) TABS Take 2,000 Units by mouth daily.  . Liniments  (SALONPAS PAIN RELIEF PATCH EX) Place 1 patch onto the skin daily as needed (pain.).  Marland Kitchen metFORMIN (GLUCOPHAGE) 1000 MG tablet Take 1 tablet (1,000 mg total) by mouth daily with breakfast.  . simvastatin (ZOCOR) 40 MG tablet TAKE 1 TABLET BY MOUTH EVERYDAY AT BEDTIME   No current facility-administered medications for this visit. (Other)      REVIEW OF SYSTEMS: ROS    Positive for: Neurological, Endocrine, Eyes   Negative for: Constitutional, Gastrointestinal, Skin, Genitourinary, Musculoskeletal, HENT, Cardiovascular, Respiratory, Psychiatric, Allergic/Imm, Heme/Lymph   Last edited by Corrinne Eagle on 12/19/2020  1:05 PM. (History)       ALLERGIES Allergies  Allergen Reactions  . Ace Inhibitors Cough    PAST MEDICAL HISTORY Past Medical History:  Diagnosis Date  . Anxiety   . Cataract    OS  . Diabetes mellitus without complication (HCC)   . Diabetic retinopathy (HCC)    NPDR OU  . Hyperlipidemia   . Hypertension   . Hypertensive retinopathy    OU  . Tremors of nervous system    "just when I get in a nervous Situation".   Past Surgical History:  Procedure Laterality Date  . CATARACT EXTRACTION W/PHACO Right 10/18/2018   Procedure: CATARACT EXTRACTION PHACO AND INTRAOCULAR LENS PLACEMENT (IOC);  Surgeon: Fabio Pierce, MD;  Location: [facility];  Service: Ophthalmology;  Laterality: Right;  CDE: 8.11  . CATARACT EXTRACTION W/PHACO Left 01/17/2019   Procedure: CATARACT EXTRACTION PHACO AND INTRAOCULAR LENS PLACEMENT (IOC);  Surgeon: Fabio Pierce, MD;  Location: [facility];  Service: Ophthalmology;  Laterality: Left;  CDE: 6.38  . CESAREAN SECTION     1988  . COLONOSCOPY WITH PROPOFOL N/A 05/04/2020   Procedure: COLONOSCOPY WITH PROPOFOL;  Surgeon: Dolores Frame, MD;  Location: [facility];  Service: Gastroenterology;  Laterality: N/A;  830  . EYE SURGERY Right    Cataract extraction OD  . POLYPECTOMY  05/04/2020   Procedure: POLYPECTOMY;  Surgeon: Dolores Frame, MD;  Location: [facility];  Service: Gastroenterology;;    FAMILY HISTORY Family History  Problem Relation Age of Onset  . Cancer Mother        breast and colon cancer  . Breast cancer Mother   . Diabetes Brother   . Diabetes Son   . Asthma Father        COPD  . Diabetes Son     SOCIAL HISTORY Social History   Tobacco Use  . Smoking status: Never Smoker  . Smokeless tobacco: Never Used  Vaping Use  . Vaping Use: Never used  Substance Use Topics  . Alcohol use: No  . Drug use: No         OPHTHALMIC EXAM:  Base Eye Exam    Visual Acuity (Snellen - Linear)      Right Left   Dist [place] 20/20 -2 20/20 -1       Tonometry (Tonopen, 1:09 PM)      Right Left   Pressure 14 17       Pupils      Dark Light Shape React APD   Right 2 1 Round Minimal 0   Left 2 1 Round Minimal 0       Visual Fields      Left Right    Full Full       Extraocular Movement      Right Left    Full Full       Neuro/Psych    Oriented x3: Yes   Mood/Affect: Normal       Dilation    Both eyes: 1.0% Mydriacyl, 2.5% Phenylephrine @ 1:09 PM        Slit Lamp and Fundus Exam    Slit Lamp Exam      Right Left   Lids/Lashes Dermatochalasis - upper lid, Meibomian gland dysfunction Dermatochalasis - upper lid, Meibomian gland dysfunction   Conjunctiva/Sclera White and quiet White and quiet   Cornea Mild Arcus, Well healed temporal cataract wounds, trace Punctate epithelial erosions Mild Arcus, EBMD, trace Punctate epithelial erosions, Well healed temporal cataract wounds, mild Debris in tear film   Anterior Chamber Deep, 1+cell/pigment Deep and quiet   Iris Round and dilated, No NVI, PPM nasally Round and dilated, No NVI   Lens PC IOL in good position PC IOL in good position, trace PCO   Vitreous Vitreous syneresis, mild vitreous condensations, Posterior vitreous detachment, old, white VH settled inferiorly Vitreous syneresis       Fundus Exam      Right Left    Disc mild pallor, Sharp rim mild pallor, Sharp rim   C/D Ratio 0.4 0.4   Macula Flat, good foveal reflex, IRH -- stably improved, edema -- slightly increased, focal cluster of exudate temporal macula, epiretinal membrane Flat, blunted foveal reflex, epiretinal membrane,  scattered MA, RPE mottling and clumping, cluster of MA/exudate temporal macula with stable improvement in cystic changes   Vessels Vascular attenuation, Tortuous, AV crossing changes Vascular attenuation, Tortuous   Periphery Attached, scattered MAs, DBH, exudates inferior to disc - resolved, inferior retinal partially obscured by vitreous condensations / old VH attached, scattered DBH greatest nasally and IN, focal exudates nasal midzone          IMAGING AND PROCEDURES  Imaging and Procedures for 12/10/17  OCT, Retina - OU - Both Eyes       Right Eye Quality was good. Central Foveal Thickness: 386. Progression has worsened. Findings include intraretinal hyper-reflective material, no SRF, outer retinal atrophy, abnormal foveal contour, intraretinal fluid (Interval re-development of IRF, interval improvement in vitreous opacities).   Left Eye Quality was good. Central Foveal Thickness: 364. Progression has been stable. Findings include abnormal foveal contour, intraretinal hyper-reflective material, vitreomacular adhesion , epiretinal membrane, no IRF, no SRF (stable improvement in IRF, mild persistent IRHM).   Notes **Images taken, stored on drive**  Diagnosis / Impression:  DME OU (OD>OS) OD: Interval re-development of IRF, interval improvement in vitreous opacities OS: stable improvement in IRF, mild persistent IRHM  Clinical management:  See below  Abbreviations: NFP - Normal foveal profile. CME - cystoid macular edema. PED - pigment epithelial detachment. IRF - intraretinal fluid. SRF - subretinal fluid. EZ - ellipsoid zone. ERM - epiretinal membrane. ORA - outer retinal atrophy. ORT - outer retinal tubulation.  SRHM - subretinal hyper-reflective material         Intravitreal Injection, Pharmacologic Agent - OD - Right Eye       Time Out 12/19/2020. 1:42 PM. Confirmed correct patient, procedure, site, and patient consented.   Anesthesia Topical anesthesia was used. Anesthetic medications included Lidocaine 2%, Proparacaine 0.5%.   Procedure Preparation included 5% betadine to ocular surface, eyelid speculum. A (32g) needle was used.   Injection:  2 mg aflibercept Gretta Cool(EYLEA) SOLN   NDC: [id number], Lot: [id number], Expiration date: 08/10/2021   Route: Intravitreal, Site: Right Eye, Waste: 0.05 mL  Post-op Post injection exam found visual acuity of at least counting fingers. The patient tolerated the procedure well. There were no complications. The patient received written and verbal post procedure care education. Post injection medications were not given.                 ASSESSMENT/PLAN:    ICD-10-CM   1. 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 Retinal edema  H35.81 OCT, Retina - OU - Both Eyes  3. Posterior vitreous detachment of right eye  H43.811   4. Epiretinal membrane (ERM) of left eye  H35.372   5. Essential hypertension  I10   6. Hypertensive retinopathy of both eyes  H35.033   7. Pseudophakia of both eyes  Z96.1     1,2. Moderate nonproliferative diabetic retinopathy, both eyes  - Diabetic macular edema, both eyes -- relatively mild  - S/P IVA OD #1 (12.17.18), #2 (01.28.19), #3 (02.27.19), #4 (04.03.19), #5 (05.01.19), #6 (05.27.20), #7 (07.08.20)  - in reviewing her case, there was minimal response to IVA in OCT or BCVA  - S/P IVTA OD #1 (05.29.19), #2 (07.24.19), #3 (09.04.19), #4 (12.04.19), #5 (02.26.20), # 6 (08.05.20), # 7(09.30.20), #8 (12.23.20)  - s/p IVE OD #1  (03.03.21), #2 (  03.31.21), #3 (04.28.21), #4 (05.28.21), #5 (08.04.21), #6 (09.08.21), #7 (10.06.21), #8 (12.15.21), #9 (02.09.22)  - s/p IVE OS #1 (04.28.21), #2 (05.28.21), #3 (12.15.21), #4 (02.09.22)  - S/P focal laser OS (09.02.20)  - s/p focal laser OD (11.17.21)  - OCT shows OD: Interval re-development of IRF, interval improvement in vitreous opacities; OS: stable improvement in IRF, mild persistent IRHM  - BCVA stable at 20/20 OU  - recommend IVE OD #10 today, 05.11.22; will hold OS  - pt wishes to proceed with injection  - RBA of procedure discussed, questions answered  - informed consent obtained and signed  - see procedure note  - cont Bromfenac BID OU  - continue PF BID OU  - f/u 8 weeks -- DFE/OCT possible injection(s)  3. PVD / vitreous syneresis OD  - Discussed findings and prognosis  - No RT or RD on 360 scleral depressed exam  - Reviewed s/s of RT/RD  - Strict return precautions for any such RT/RD signs/symptoms  - f/u in 4 weeks DFE, OCT  4. Epiretinal membrane, left eye  - mild ERM with VMA/VMT component - BCVA 20/20 - asymptomatic, no metamorphopsia - no indication for surgery at this time - monitor for now  5,6. Hypertensive retinopathy OU  - discussed importance of tight BP control  - monitor  7. Pseudophakia OU  - s/p CE/IOL OU (Dr. June Leap, March/June 2020)  - beautiful surgeries, doing well  - monitor   Ophthalmic Meds Ordered this visit:  Meds ordered this encounter  Medications  . aflibercept (EYLEA) SOLN 2 mg       Return in about 8 weeks (around 02/13/2021) for f/u NPDR OU, DFE, OCT.  There are no Patient Instructions on file for this visit.  This document serves as a record of services personally performed by Karie Chimera, MD, PhD. It was created on their behalf by Annalee Genta, COMT. The creation of this record is the provider's dictation and/or activities during the visit.  Electronically signed by: Annalee Genta, COMT 12/19/20 2:39  PM   This document serves as a record of services personally performed by Karie Chimera, MD, PhD. It was created on their behalf by Glee Arvin. Manson Passey, OA an ophthalmic technician. The creation of this record is the provider's dictation and/or activities during the visit.    Electronically signed by: Glee Arvin. Manson Passey, [place] 05.11.2022 2:39 PM  Karie Chimera, M.D., Ph.D. Diseases & Surgery of the Retina and Vitreous [facility]  I have reviewed the above documentation for accuracy and completeness, and I agree with the above. Karie Chimera, M.D., Ph.D. 12/19/20 2:39 PM   Abbreviations: M myopia (nearsighted); A astigmatism; H hyperopia (farsighted); P presbyopia; Mrx spectacle prescription;  CTL contact lenses; OD right eye; OS left eye; OU both eyes  XT exotropia; ET esotropia; PEK punctate epithelial keratitis; PEE punctate epithelial erosions; DES dry eye syndrome; MGD meibomian gland dysfunction; ATs artificial tears; PFAT's preservative free artificial tears; NSC nuclear sclerotic cataract; PSC posterior subcapsular cataract; ERM epi-retinal membrane; PVD posterior vitreous detachment; RD retinal detachment; DM diabetes mellitus; DR diabetic retinopathy; NPDR non-proliferative diabetic retinopathy; PDR proliferative diabetic retinopathy; CSME clinically significant macular edema; DME diabetic macular edema; dbh dot blot hemorrhages; CWS cotton wool spot; POAG primary open angle glaucoma; C/D cup-to-disc ratio; HVF humphrey visual field; GVF goldmann visual field; OCT optical coherence tomography; IOP intraocular pressure; BRVO Branch retinal vein occlusion; CRVO central retinal vein occlusion; CRAO central retinal artery  occlusion; BRAO branch retinal artery occlusion; RT retinal tear; SB scleral buckle; PPV pars plana vitrectomy; VH Vitreous hemorrhage; PRP panretinal laser photocoagulation; IVK intravitreal kenalog; VMT vitreomacular traction; MH Macular hole;  NVD  neovascularization of the disc; NVE neovascularization elsewhere; AREDS age related eye disease study; ARMD age related macular degeneration; POAG primary open angle glaucoma; EBMD epithelial/anterior basement membrane dystrophy; ACIOL anterior chamber intraocular lens; IOL intraocular lens; PCIOL posterior chamber intraocular lens; Phaco/IOL phacoemulsification with intraocular lens placement; [place] photorefractive keratectomy; LASIK laser assisted in situ keratomileusis; HTN hypertension; DM diabetes mellitus; COPD chronic obstructive pulmonary disease

## 2020-12-19 ENCOUNTER — Other Ambulatory Visit: Payer: Self-pay

## 2020-12-19 ENCOUNTER — Encounter (INDEPENDENT_AMBULATORY_CARE_PROVIDER_SITE_OTHER): Payer: Self-pay | Admitting: Ophthalmology

## 2020-12-19 ENCOUNTER — Ambulatory Visit (INDEPENDENT_AMBULATORY_CARE_PROVIDER_SITE_OTHER): Payer: PPO | Admitting: Ophthalmology

## 2020-12-19 DIAGNOSIS — H3581 Retinal edema: Secondary | ICD-10-CM | POA: Diagnosis not present

## 2020-12-19 DIAGNOSIS — H43811 Vitreous degeneration, right eye: Secondary | ICD-10-CM | POA: Diagnosis not present

## 2020-12-19 DIAGNOSIS — E113313 Type 2 diabetes mellitus with moderate nonproliferative diabetic retinopathy with macular edema, bilateral: Secondary | ICD-10-CM | POA: Diagnosis not present

## 2020-12-19 DIAGNOSIS — H35372 Puckering of macula, left eye: Secondary | ICD-10-CM | POA: Diagnosis not present

## 2020-12-19 DIAGNOSIS — I10 Essential (primary) hypertension: Secondary | ICD-10-CM | POA: Diagnosis not present

## 2020-12-19 DIAGNOSIS — H35033 Hypertensive retinopathy, bilateral: Secondary | ICD-10-CM | POA: Diagnosis not present

## 2020-12-19 DIAGNOSIS — Z961 Presence of intraocular lens: Secondary | ICD-10-CM | POA: Diagnosis not present

## 2020-12-19 MED ORDER — AFLIBERCEPT 2MG/0.05ML IZ SOLN FOR KALEIDOSCOPE
2.0000 mg | INTRAVITREAL | Status: AC | PRN
  Administered 2020-12-19: 2 mg via INTRAVITREAL

## 2021-01-06 ENCOUNTER — Other Ambulatory Visit: Payer: Self-pay | Admitting: Family Medicine

## 2021-01-06 DIAGNOSIS — E119 Type 2 diabetes mellitus without complications: Secondary | ICD-10-CM

## 2021-02-08 NOTE — Progress Notes (Signed)
[facility] - Clinic Note  02/13/2021     CHIEF COMPLAINT Patient presents for Retina Follow Up   HISTORY OF PRESENT ILLNESS: Carolyn Hunter is a 70 y.o. female who presents to the clinic today for:   HPI     Retina Follow Up   Patient presents with  Diabetic Retinopathy.  In both eyes.  Duration of 8 weeks.  Since onset it is stable.  I, the attending physician,  performed the HPI with the patient and updated documentation appropriately.        Comments   Pt here for retinal follow up NPDR OU. Pt states vision is okay, no changes or issues reported. BS 110 this am and A1C will be checked next here.       Last edited by Rennis Chris, MD on 02/13/2021  2:56 PM.    pt states vision is the same, she states her eye feels a little strange today, a little sore  Referring physician: Freddy Finner, NP [address] [place] [address] [place],  [place] [address]  HISTORICAL INFORzMATION:   Selected notes from the MEDICAL RECORD NUMBER Referral from Dr. Fabienne Bruns for DM exam   CURRENT MEDICATIONS: Current Outpatient Medications (Ophthalmic Drugs)  Medication Sig   Aflibercept (EYLEA IO) Inject 1 Dose into the eye every 30 (thirty) days.   Bromfenac Sodium (PROLENSA) 0.07 % SOLN Place 1 drop into both eyes in the morning, at noon, in the evening, and at bedtime. (Patient taking differently: Place 1 drop into both eyes in the morning and at bedtime. )   Bromfenac Sodium 0.09 % SOLN Place 1 drop into both eyes 2 (two) times daily.   prednisoLONE acetate (PRED FORTE) 1 % ophthalmic suspension INSTILL 1 DROP INTO BOTH EYES 4 TIMES A DAY   No current facility-administered medications for this visit. (Ophthalmic Drugs)   Current Outpatient Medications (Other)  Medication Sig   amLODipine (NORVASC) 5 MG tablet TAKE 1 TABLET BY MOUTH EVERY DAY   aspirin EC 81 MG tablet Take 1 tablet (81 mg total) by mouth daily.   Cholecalciferol (VITAMIN D3) 50 MCG (2000 UT) TABS  Take 2,000 Units by mouth daily.   Liniments (SALONPAS PAIN RELIEF PATCH EX) Place 1 patch onto the skin daily as needed (pain.).   metFORMIN (GLUCOPHAGE) 1000 MG tablet TAKE 1 TABLET BY MOUTH EVERY DAY WITH BREAKFAST   simvastatin (ZOCOR) 40 MG tablet TAKE 1 TABLET BY MOUTH EVERYDAY AT BEDTIME   No current facility-administered medications for this visit. (Other)      REVIEW OF SYSTEMS: ROS   Positive for: Neurological, Endocrine, Eyes Negative for: Constitutional, Gastrointestinal, Skin, Genitourinary, Musculoskeletal, HENT, Cardiovascular, Respiratory, Psychiatric, Allergic/Imm, Heme/Lymph Last edited by Thompson Grayer, COT on 02/13/2021  1:36 PM.        ALLERGIES Allergies  Allergen Reactions   Ace Inhibitors Cough    PAST MEDICAL HISTORY Past Medical History:  Diagnosis Date   Anxiety    Cataract    OS   Diabetes mellitus without complication (HCC)    Diabetic retinopathy (HCC)    NPDR OU   Hyperlipidemia    Hypertension    Hypertensive retinopathy    OU   Tremors of nervous system    "just when I get in a nervous Situation".   Past Surgical History:  Procedure Laterality Date   CATARACT EXTRACTION W/PHACO Right 10/18/2018   Procedure: CATARACT EXTRACTION PHACO AND INTRAOCULAR LENS PLACEMENT (IOC);  Surgeon: June Leap,  Fayrene Fearing, MD;  Location: [facility];  Service: Ophthalmology;  Laterality: Right;  CDE: 8.11   CATARACT EXTRACTION W/PHACO Left 01/17/2019   Procedure: CATARACT EXTRACTION PHACO AND INTRAOCULAR LENS PLACEMENT (IOC);  Surgeon: Fabio Pierce, MD;  Location: [facility];  Service: Ophthalmology;  Laterality: Left;  CDE: 6.38   CESAREAN SECTION     1988   COLONOSCOPY WITH PROPOFOL N/A 05/04/2020   Procedure: COLONOSCOPY WITH PROPOFOL;  Surgeon: Dolores Frame, MD;  Location: [facility];  Service: Gastroenterology;  Laterality: N/A;  830   EYE SURGERY Right    Cataract extraction OD   POLYPECTOMY  05/04/2020   Procedure: POLYPECTOMY;  Surgeon:  Marguerita Merles, Reuel Boom, MD;  Location: [facility];  Service: Gastroenterology;;    FAMILY HISTORY Family History  Problem Relation Age of Onset   Cancer Mother        breast and colon cancer   Breast cancer Mother    Diabetes Brother    Diabetes Son    Asthma Father        COPD   Diabetes Son     SOCIAL HISTORY Social History   Tobacco Use   Smoking status: Never   Smokeless tobacco: Never  Vaping Use   Vaping Use: Never used  Substance Use Topics   Alcohol use: No   Drug use: No         OPHTHALMIC EXAM:  Base Eye Exam     Visual Acuity (Snellen - Linear)       Right Left   Dist [place] 20/25 20/20 -2   Dist ph [place] NI          Tonometry (Tonopen, 1:42 PM)       Right Left   Pressure 12 16         Pupils       Dark Light Shape React APD   Right 2 1 Round Minimal None   Left 2 1 Round Minimal None         Visual Fields (Counting fingers)       Left Right    Full Full         Extraocular Movement       Right Left    Full, Ortho Full, Ortho         Neuro/Psych     Oriented x3: Yes   Mood/Affect: Normal         Dilation     Both eyes: 1.0% Mydriacyl, 2.5% Phenylephrine @ 1:42 PM           Slit Lamp and Fundus Exam     Slit Lamp Exam       Right Left   Lids/Lashes Dermatochalasis - upper lid, Meibomian gland dysfunction Dermatochalasis - upper lid, Meibomian gland dysfunction   Conjunctiva/Sclera White and quiet White and quiet   Cornea Mild Arcus, Well healed temporal cataract wounds, trace Punctate epithelial erosions Mild Arcus, EBMD, trace Punctate epithelial erosions, Well healed temporal cataract wounds, mild Debris in tear film   Anterior Chamber Deep, 1+cell/pigment Deep and quiet   Iris Round and dilated, No NVI, PPM nasally Round and dilated, No NVI   Lens PC IOL in good position PC IOL in good position, trace PCO   Vitreous Vitreous syneresis, mild vitreous condensations, Posterior vitreous detachment, old,  white VH settled inferiorly Vitreous syneresis         Fundus Exam       Right Left   Disc mild pallor, Sharp rim  mild pallor, Sharp rim   C/D Ratio 0.4 0.4   Macula Flat, good foveal reflex, focal exudates and trace cystic changes, temporal macula, epiretinal membrane Flat, blunted foveal reflex, epiretinal membrane, scattered MA, RPE mottling and clumping, cluster of MA/exudate temporal macula with stable improvement in cystic changes   Vessels Vascular attenuation, Tortuous, AV crossing changes Vascular attenuation, Tortuous   Periphery Attached, scattered MAs, DBH, exudates inferior to disc - resolved, inferior retinal partially obscured by vitreous condensations / old VH attached, scattered DBH greatest nasally and IN, focal exudates nasal midzone            IMAGING AND PROCEDURES  Imaging and Procedures for 12/10/17  OCT, Retina - OU - Both Eyes       Right Eye Quality was good. Central Foveal Thickness: 295. Progression has improved. Findings include intraretinal hyper-reflective material, no SRF, outer retinal atrophy, abnormal foveal contour, intraretinal fluid, normal foveal contour (Interval resolution of central IRF, interval improvement in temporal IRF).   Left Eye Quality was good. Central Foveal Thickness: 360. Progression has been stable. Findings include abnormal foveal contour, intraretinal hyper-reflective material, vitreomacular adhesion , epiretinal membrane, no IRF, no SRF (stable improvement in IRF -- just trace cystic changes remain, mild persistent IRHM).   Notes **Images taken, stored on drive**  Diagnosis / Impression:  DME OU (OD>OS) OD: Interval resolution of central IRF, interval improvement in temporal IRF OS: stable improvement in IRF -- just trace cystic changes remain, mild persistent IRHM  Clinical management:  See below  Abbreviations: NFP - Normal foveal profile. CME - cystoid macular edema. PED - pigment epithelial detachment. IRF -  intraretinal fluid. SRF - subretinal fluid. EZ - ellipsoid zone. ERM - epiretinal membrane. ORA - outer retinal atrophy. ORT - outer retinal tubulation. SRHM - subretinal hyper-reflective material                ASSESSMENT/PLAN:    ICD-10-CM   1. Moderate nonproliferative diabetic retinopathy of both eyes with macular edema associated with type 2 diabetes mellitus (HCC)  E11.3313 OCT, Retina - OU - Both Eyes    2. Posterior vitreous detachment of right eye  H43.811     3. Epiretinal membrane (ERM) of left eye  H35.372 OCT, Retina - OU - Both Eyes    4. Essential hypertension  I10     5. Hypertensive retinopathy of both eyes  H35.033     6. Pseudophakia of both eyes  Z96.1        1. Moderate nonproliferative diabetic retinopathy, both eyes  - Diabetic macular edema, both eyes -- relatively mild and improved  - S/P IVA OD #1 (12.17.18), #2 (01.28.19), #3 (02.27.19), #4 (04.03.19), #5 (05.01.19), #6 (05.27.20), #7 (07.08.20)  - in reviewing her case, there was minimal response to IVA in OCT or BCVA  - S/P IVTA OD #1 (05.29.19), #2 (07.24.19), #3 (09.04.19), #4 (12.04.19), #5 (02.26.20), # 6 (08.05.20), # 7(09.30.20), #8 (12.23.20)  - s/p IVE OD #1 (03.03.21), #2 (03.31.21), #3 (04.28.21), #4 (05.28.21), #5 (08.04.21), #6 (09.08.21), #7 (10.06.21), #8 (12.15.21), #9 (02.09.22), #10 (05.11.22)  - s/p IVE OS #1 (04.28.21), #2 (05.28.21), #3 (12.15.21), #4 (02.09.22)  - S/P focal laser OS (09.02.20)  - s/p focal laser OD (11.17.21)  - OCT shows OD: Interval resolution of central IRF, interval improvement in temporal IRF; OS: stable improvement in IRF -- just trace cystic changes remain, mild persistent IRHM  - BCVA stable at 20/20 OU  - recommend holding injections OU  today  - pt in agreement  - cont Bromfenac BID OU  - continue PF BID OU  - f/u 6-8 weeks -- DFE/OCT possible injection(s)  2. PVD / vitreous syneresis OD  - Discussed findings and prognosis  - No RT or RD on 360  scleral depressed exam  - Reviewed s/s of RT/RD  - Strict return precautions for any such RT/RD signs/symptoms   3. Epiretinal membrane, left eye  - mild ERM with VMA/VMT component - BCVA remains 20/20 - still asymptomatic, no metamorphopsia - no indication for surgery at this time - monitor for now   4,5. Hypertensive retinopathy OU  - discussed importance of tight BP control  - monitor   6. Pseudophakia OU  - s/p CE/IOL OU (Dr. June Leap, March/June 2020)  - beautiful surgeries, doing well  - monitor    Ophthalmic Meds Ordered this visit:  No orders of the defined types were placed in this encounter.      Return for f/u 6-8 weeks, NPDR OU, DFE, OCT.  There are no Patient Instructions on file for this visit.  This document serves as a record of services personally performed by Karie Chimera, MD, PhD. It was created on their behalf by Joni Reining, an ophthalmic technician. The creation of this record is the provider's dictation and/or activities during the visit.    Electronically signed by: Joni Reining COA, 02/14/21  2:43 PM  This document serves as a record of services personally performed by Karie Chimera, MD, PhD. It was created on their behalf by Glee Arvin. Manson Passey, OA an ophthalmic technician. The creation of this record is the provider's dictation and/or activities during the visit.    Electronically signed by: Glee Arvin. Kristopher Oppenheim 07.06.2022 2:43 PM  Karie Chimera, M.D., Ph.D. Diseases & Surgery of the Retina and Vitreous [facility] 02/13/2021  I have reviewed the above documentation for accuracy and completeness, and I agree with the above. Karie Chimera, M.D., Ph.D. 02/14/21 2:45 PM  Abbreviations: M myopia (nearsighted); A astigmatism; H hyperopia (farsighted); P presbyopia; Mrx spectacle prescription;  CTL contact lenses; OD right eye; OS left eye; OU both eyes  XT exotropia; ET esotropia; PEK punctate epithelial keratitis; PEE  punctate epithelial erosions; DES dry eye syndrome; MGD meibomian gland dysfunction; ATs artificial tears; PFAT's preservative free artificial tears; NSC nuclear sclerotic cataract; PSC posterior subcapsular cataract; ERM epi-retinal membrane; PVD posterior vitreous detachment; RD retinal detachment; DM diabetes mellitus; DR diabetic retinopathy; NPDR non-proliferative diabetic retinopathy; PDR proliferative diabetic retinopathy; CSME clinically significant macular edema; DME diabetic macular edema; dbh dot blot hemorrhages; CWS cotton wool spot; POAG primary open angle glaucoma; C/D cup-to-disc ratio; HVF humphrey visual field; GVF goldmann visual field; OCT optical coherence tomography; IOP intraocular pressure; BRVO Branch retinal vein occlusion; CRVO central retinal vein occlusion; CRAO central retinal artery occlusion; BRAO branch retinal artery occlusion; RT retinal tear; SB scleral buckle; PPV pars plana vitrectomy; VH Vitreous hemorrhage; PRP panretinal laser photocoagulation; IVK intravitreal kenalog; VMT vitreomacular traction; MH Macular hole;  NVD neovascularization of the disc; NVE neovascularization elsewhere; AREDS age related eye disease study; ARMD age related macular degeneration; POAG primary open angle glaucoma; EBMD epithelial/anterior basement membrane dystrophy; ACIOL anterior chamber intraocular lens; IOL intraocular lens; PCIOL posterior chamber intraocular lens; Phaco/IOL phacoemulsification with intraocular lens placement; PRK photorefractive keratectomy; LASIK laser assisted in situ keratomileusis; HTN hypertension; DM diabetes mellitus; COPD chronic obstructive pulmonary disease

## 2021-02-12 ENCOUNTER — Other Ambulatory Visit (INDEPENDENT_AMBULATORY_CARE_PROVIDER_SITE_OTHER): Payer: Self-pay | Admitting: Ophthalmology

## 2021-02-13 ENCOUNTER — Other Ambulatory Visit: Payer: Self-pay

## 2021-02-13 ENCOUNTER — Ambulatory Visit (INDEPENDENT_AMBULATORY_CARE_PROVIDER_SITE_OTHER): Payer: PPO | Admitting: Ophthalmology

## 2021-02-13 ENCOUNTER — Encounter (INDEPENDENT_AMBULATORY_CARE_PROVIDER_SITE_OTHER): Payer: Self-pay | Admitting: Ophthalmology

## 2021-02-13 DIAGNOSIS — Z961 Presence of intraocular lens: Secondary | ICD-10-CM

## 2021-02-13 DIAGNOSIS — E113313 Type 2 diabetes mellitus with moderate nonproliferative diabetic retinopathy with macular edema, bilateral: Secondary | ICD-10-CM | POA: Diagnosis not present

## 2021-02-13 DIAGNOSIS — I10 Essential (primary) hypertension: Secondary | ICD-10-CM | POA: Diagnosis not present

## 2021-02-13 DIAGNOSIS — H35372 Puckering of macula, left eye: Secondary | ICD-10-CM | POA: Diagnosis not present

## 2021-02-13 DIAGNOSIS — H43811 Vitreous degeneration, right eye: Secondary | ICD-10-CM

## 2021-02-13 DIAGNOSIS — H35033 Hypertensive retinopathy, bilateral: Secondary | ICD-10-CM | POA: Diagnosis not present

## 2021-02-19 ENCOUNTER — Encounter: Payer: PPO | Admitting: Family Medicine

## 2021-02-20 ENCOUNTER — Ambulatory Visit (INDEPENDENT_AMBULATORY_CARE_PROVIDER_SITE_OTHER): Payer: PPO | Admitting: *Deleted

## 2021-02-20 ENCOUNTER — Other Ambulatory Visit: Payer: Self-pay

## 2021-02-20 VITALS — BP 110/80 | Ht 63.0 in | Wt 150.0 lb

## 2021-02-20 DIAGNOSIS — Z0000 Encounter for general adult medical examination without abnormal findings: Secondary | ICD-10-CM | POA: Diagnosis not present

## 2021-02-20 NOTE — Patient Instructions (Signed)
[name] , Thank you for taking time to come for your Medicare Wellness Visit. I appreciate your ongoing commitment to your health goals. Please review the following plan we discussed and let me know if I can assist you in the future.   Screening recommendations/referrals: Colonoscopy: Completed Due 05-04-2030 Mammogram: Ordered Pt wants done in [place] Bone Density: Completed 01-22-2025 Recommended yearly ophthalmology/optometry visit for glaucoma screening and checkup Recommended yearly dental visit for hygiene and checkup  Vaccinations: Influenza vaccine: 03-11-21 Pneumococcal vaccine: Completed Tdap vaccine: Completed 08-11-25  Shingles vaccine: Due now patient doesn't want to take at this time      Advanced directives: Information provided  Conditions/risks identified: Hypertension, Diabetes  Next appointment: 1 year    Preventive Care 68 Years and Older, Female Preventive care refers to lifestyle choices and visits with your health care provider that can promote health and wellness. What does preventive care include? A yearly physical exam. This is also called an annual well check. Dental exams once or twice a year. Routine eye exams. Ask your health care provider how often you should have your eyes checked. Personal lifestyle choices, including: Daily care of your teeth and gums. Regular physical activity. Eating a healthy diet. Avoiding tobacco and drug use. Limiting alcohol use. Practicing safe sex. Taking low-dose aspirin every day. Taking vitamin and mineral supplements as recommended by your health care provider. What happens during an annual well check? The services and screenings done by your health care provider during your annual well check will depend on your age, overall health, lifestyle risk factors, and family history of disease. Counseling  Your health care provider may ask you questions about your: Alcohol use. Tobacco use. Drug use. Emotional  well-being. Home and relationship well-being. Sexual activity. Eating habits. History of falls. Memory and ability to understand (cognition). Work and work Astronomer. Reproductive health. Screening  You may have the following tests or measurements: Height, weight, and BMI. Blood pressure. Lipid and cholesterol levels. These may be checked every 5 years, or more frequently if you are over 35 years old. Skin check. Lung cancer screening. You may have this screening every year starting at age 27 if you have a 30-pack-year history of smoking and currently smoke or have quit within the past 15 years. Fecal occult blood test (FOBT) of the stool. You may have this test every year starting at age 32. Flexible sigmoidoscopy or colonoscopy. You may have a sigmoidoscopy every 5 years or a colonoscopy every 10 years starting at age 67. Hepatitis C blood test. Hepatitis B blood test. Sexually transmitted disease (STD) testing. Diabetes screening. This is done by checking your blood sugar (glucose) after you have not eaten for a while (fasting). You may have this done every 1-3 years. Bone density scan. This is done to screen for osteoporosis. You may have this done starting at age 47. Mammogram. This may be done every 1-2 years. Talk to your health care provider about how often you should have regular mammograms. Talk with your health care provider about your test results, treatment options, and if necessary, the need for more tests. Vaccines  Your health care provider may recommend certain vaccines, such as: Influenza vaccine. This is recommended every year. Tetanus, diphtheria, and acellular pertussis (Tdap, Td) vaccine. You may need a Td booster every 10 years. Zoster vaccine. You may need this after age 49. Pneumococcal 13-valent conjugate (PCV13) vaccine. One dose is recommended after age 24. Pneumococcal polysaccharide (PPSV23) vaccine. One dose is recommended after  age 2. Talk to your  health care provider about which screenings and vaccines you need and how often you need them. This information is not intended to replace advice given to you by your health care provider. Make sure you discuss any questions you have with your health care provider. Document Released: 08/24/2015 Document Revised: 04/16/2016 Document Reviewed: 05/29/2015 Elsevier Interactive Patient Education  2017 [place] Prevention in the Home Falls can cause injuries. They can happen to people of all ages. There are many things you can do to make your home safe and to help prevent falls. What can I do on the outside of my home? Regularly fix the edges of walkways and driveways and fix any cracks. Remove anything that might make you trip as you walk through a door, such as a raised step or threshold. Trim any bushes or trees on the path to your home. Use bright outdoor lighting. Clear any walking paths of anything that might make someone trip, such as rocks or tools. Regularly check to see if handrails are loose or broken. Make sure that both sides of any steps have handrails. Any raised decks and porches should have guardrails on the edges. Have any leaves, snow, or ice cleared regularly. Use sand or salt on walking paths during winter. Clean up any spills in your garage right away. This includes oil or grease spills. What can I do in the bathroom? Use night lights. Install grab bars by the toilet and in the tub and shower. Do not use towel bars as grab bars. Use non-skid mats or decals in the tub or shower. If you need to sit down in the shower, use a plastic, non-slip stool. Keep the floor dry. Clean up any water that spills on the floor as soon as it happens. Remove soap buildup in the tub or shower regularly. Attach bath mats securely with double-sided non-slip rug tape. Do not have throw rugs and other things on the floor that can make you trip. What can I do in the bedroom? Use night  lights. Make sure that you have a light by your bed that is easy to reach. Do not use any sheets or blankets that are too big for your bed. They should not hang down onto the floor. Have a firm chair that has side arms. You can use this for support while you get dressed. Do not have throw rugs and other things on the floor that can make you trip. What can I do in the kitchen? Clean up any spills right away. Avoid walking on wet floors. Keep items that you use a lot in easy-to-reach places. If you need to reach something above you, use a strong step stool that has a grab bar. Keep electrical cords out of the way. Do not use floor polish or wax that makes floors slippery. If you must use wax, use non-skid floor wax. Do not have throw rugs and other things on the floor that can make you trip. What can I do with my stairs? Do not leave any items on the stairs. Make sure that there are handrails on both sides of the stairs and use them. Fix handrails that are broken or loose. Make sure that handrails are as long as the stairways. Check any carpeting to make sure that it is firmly attached to the stairs. Fix any carpet that is loose or worn. Avoid having throw rugs at the top or bottom of the stairs. If you do  have throw rugs, attach them to the floor with carpet tape. Make sure that you have a light switch at the top of the stairs and the bottom of the stairs. If you do not have them, ask someone to add them for you. What else can I do to help prevent falls? Wear shoes that: Do not have high heels. Have rubber bottoms. Are comfortable and fit you well. Are closed at the toe. Do not wear sandals. If you use a stepladder: Make sure that it is fully opened. Do not climb a closed stepladder. Make sure that both sides of the stepladder are locked into place. Ask someone to hold it for you, if possible. Clearly mark and make sure that you can see: Any grab bars or handrails. First and last  steps. Where the edge of each step is. Use tools that help you move around (mobility aids) if they are needed. These include: Canes. Walkers. Scooters. Crutches. Turn on the lights when you go into a dark area. Replace any light bulbs as soon as they burn out. Set up your furniture so you have a clear path. Avoid moving your furniture around. If any of your floors are uneven, fix them. If there are any pets around you, be aware of where they are. Review your medicines with your doctor. Some medicines can make you feel dizzy. This can increase your chance of falling. Ask your doctor what other things that you can do to help prevent falls. This information is not intended to replace advice given to you by your health care provider. Make sure you discuss any questions you have with your health care provider. Document Released: 05/24/2009 Document Revised: 01/03/2016 Document Reviewed: 09/01/2014 Elsevier Interactive Patient Education  2017 [employer].

## 2021-02-20 NOTE — Progress Notes (Signed)
Subjective:   Carolyn Hunter is a 70 y.o. female who presents for Medicare Annual (Subsequent) preventive examination.  Review of Systems           Objective:    There were no vitals filed for this visit. There is no height or weight on file to calculate BMI.  Advanced Directives 05/04/2020 05/02/2020 10/18/2018 10/13/2018  Does Patient Have a Medical Advance Directive? No No No No  Would patient like information on creating a medical advance directive? - No - Patient declined No - Patient declined No - Patient declined    Current Medications (verified) Outpatient Encounter Medications as of 02/20/2021  Medication Sig   Aflibercept (EYLEA IO) Inject 1 Dose into the eye every 30 (thirty) days.   amLODipine (NORVASC) 5 MG tablet TAKE 1 TABLET BY MOUTH EVERY DAY   aspirin EC 81 MG tablet Take 1 tablet (81 mg total) by mouth daily.   Bromfenac Sodium (PROLENSA) 0.07 % SOLN Place 1 drop into both eyes in the morning, at noon, in the evening, and at bedtime. (Patient taking differently: Place 1 drop into both eyes in the morning and at bedtime. )   Bromfenac Sodium 0.09 % SOLN Place 1 drop into both eyes 2 (two) times daily.   Cholecalciferol (VITAMIN D3) 50 MCG (2000 UT) TABS Take 2,000 Units by mouth daily.   Liniments (SALONPAS PAIN RELIEF PATCH EX) Place 1 patch onto the skin daily as needed (pain.).   metFORMIN (GLUCOPHAGE) 1000 MG tablet TAKE 1 TABLET BY MOUTH EVERY DAY WITH BREAKFAST   prednisoLONE acetate (PRED FORTE) 1 % ophthalmic suspension INSTILL 1 DROP INTO BOTH EYES 4 TIMES A DAY   simvastatin (ZOCOR) 40 MG tablet TAKE 1 TABLET BY MOUTH EVERYDAY AT BEDTIME   No facility-administered encounter medications on file as of 02/20/2021.    Allergies (verified) Ace inhibitors   History: Past Medical History:  Diagnosis Date   Anxiety    Cataract    OS   Diabetes mellitus without complication (HCC)    Diabetic retinopathy (HCC)    NPDR OU   Hyperlipidemia    Hypertension     Hypertensive retinopathy    OU   Tremors of nervous system    "just when I get in a nervous Situation".   Past Surgical History:  Procedure Laterality Date   CATARACT EXTRACTION W/PHACO Right 10/18/2018   Procedure: CATARACT EXTRACTION PHACO AND INTRAOCULAR LENS PLACEMENT (IOC);  Surgeon: Fabio Pierce, MD;  Location: [facility];  Service: Ophthalmology;  Laterality: Right;  CDE: 8.11   CATARACT EXTRACTION W/PHACO Left 01/17/2019   Procedure: CATARACT EXTRACTION PHACO AND INTRAOCULAR LENS PLACEMENT (IOC);  Surgeon: Fabio Pierce, MD;  Location: [facility];  Service: Ophthalmology;  Laterality: Left;  CDE: 6.38   CESAREAN SECTION     1988   COLONOSCOPY WITH PROPOFOL N/A 05/04/2020   Procedure: COLONOSCOPY WITH PROPOFOL;  Surgeon: Dolores Frame, MD;  [facility]: [facility];  Service: Gastroenterology;  Laterality: N/A;  830   EYE SURGERY Right    Cataract extraction OD   POLYPECTOMY  05/04/2020   Procedure: POLYPECTOMY;  Surgeon: Dolores Frame, MD;  [facility]: [facility];  Service: Gastroenterology;;   Family History  Problem Relation Age of Onset   Cancer Mother        breast and colon cancer   Breast cancer Mother    Diabetes Brother    Diabetes Son    Asthma Father  COPD   Diabetes Son    Social History   Socioeconomic History   Marital status: Married    Spouse name: Donnie    Number of children: 4   Years of education: Not on file   Highest education level: Associate degree: occupational, Scientist, product/process development, or vocational program  Occupational History   Occupation: retired  Tobacco Use   Smoking status: Never   Smokeless tobacco: Never  Vaping Use   Vaping Use: Never used  Substance and Sexual Activity   Alcohol use: No   Drug use: No   Sexual activity: Yes    Birth control/protection: Post-menopausal    Comment: 70 years old  Other Topics Concern   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        Eats all food groups. Exercises 30 min a day, four days a week.    Enjoys activities. Watch television. grandkids       Social Determinants of Corporate investment banker Strain: Not on file  Food Insecurity: Not on file  Transportation Needs: Not on file  Physical Activity: Not on file  Stress: Not on file  Social Connections: Not on file    Tobacco Counseling Counseling given: Not Answered   Clinical Intake:                 Diabetic?Yes         Activities of Daily Living In your present state of health, do you have any difficulty performing the following activities: 05/02/2020  Hearing? N  Vision? N  Difficulty concentrating or making decisions? N  Walking or climbing stairs? N  Dressing or bathing? N  Doing errands, shopping? N  Some recent data might be hidden    Patient Care Team: Freddy Finner, NP as PCP - General (Family Medicine) Wyline Mood Dorothe Pea, MD as PCP - Cardiology (Cardiology)  Indicate any recent Medical Services you may have received from other than [facility] providers in the past year (date may be approximate).     Assessment:   This is a routine wellness examination for [place].  Hearing/Vision screen No results found.  Dietary issues and exercise activities discussed:     Goals Addressed   None   Depression Screen PHQ 2/9 Scores 07/13/2020 01/05/2020 10/05/2019 04/20/2018 02/17/2018 01/05/2018 04/17/2017  PHQ - 2 Score 0 0 0 0 0 0 0  PHQ- 9 Score - 0 - - - - -    Fall Risk Fall Risk  07/13/2020 01/05/2020 10/05/2019 04/20/2018 02/17/2018  Falls in the past year? 0 0 0 No No  Number falls in past yr: 0 0 0 - -  Injury with Fall? 0 0 0 - -  Risk for fall due to : No Fall Risks No Fall Risks - - -  Follow up Falls evaluation completed Falls evaluation completed - - -    FALL RISK PREVENTION PERTAINING TO THE HOME:  Any stairs in or around the home? Yes  If so, are there any without handrails? Yes  Home free of loose throw  rugs in walkways, pet beds, electrical cords, etc? Yes  Adequate lighting in your home to reduce risk of falls? Yes   ASSISTIVE DEVICES UTILIZED TO PREVENT FALLS:  Life alert? No  Use of a cane, walker or w/c? No  Grab bars in the bathroom? Yes  Shower chair or bench in shower? No  Elevated toilet seat or a handicapped  toilet? Yes   TIMED UP AND GO:  Was the test performed? No .  Length of time to ambulate 10 feet: NA sec.     Cognitive Function:        Immunizations Immunization History  Administered Date(s) Administered   Fluad Quad(high Dose 65+) 05/11/2020   Influenza,inj,Quad PF,6+ Mos 04/17/2017, 04/20/2018   Moderna Sars-Covid-2 Vaccination 10/06/2019, 11/04/2019   Pneumococcal Conjugate-13 04/17/2017, 01/05/2020   Pneumococcal Polysaccharide-23 01/05/2018    TDAP status: Up to date  Flu Vaccine status: Up to date  Pneumococcal vaccine status: Up to date  Covid-19 vaccine status: Completed vaccines  Qualifies for Shingles Vaccine? Yes   Zostavax completed No   Shingrix Completed?: No.    Education has been provided regarding the importance of this vaccine. Patient has been advised to call insurance company to determine out of pocket expense if they have not yet received this vaccine. Advised may also receive vaccine at local pharmacy or Health Dept. Verbalized acceptance and understanding.  Screening Tests Health Maintenance  Topic Date Due   Zoster Vaccines- Shingrix (1 of 2) Never done   FOOT EXAM  05/07/2018   OPHTHALMOLOGY EXAM  06/03/2018   COVID-19 Vaccine (3 - Moderna risk series) 12/02/2019   URINE MICROALBUMIN  01/09/2021   HEMOGLOBIN A1C  01/11/2021   INFLUENZA VACCINE  03/11/2021   MAMMOGRAM  02/02/2022   PNA vac Low Risk Adult (2 of 2 - PPSV23) 01/06/2023   TETANUS/TDAP  08/11/2025   COLONOSCOPY (Pts 45-2181yrs Insurance coverage will need to be confirmed)  05/04/2030   DEXA SCAN  Completed   Hepatitis C Screening  Completed   HPV VACCINES   Aged Out    Health Maintenance  Health Maintenance Due  Topic Date Due   Zoster Vaccines- Shingrix (1 of 2) Never done   FOOT EXAM  05/07/2018   OPHTHALMOLOGY EXAM  06/03/2018   COVID-19 Vaccine (3 - Moderna risk series) 12/02/2019   URINE MICROALBUMIN  01/09/2021   HEMOGLOBIN A1C  01/11/2021    Colorectal cancer screening: Type of screening: Colonoscopy. Completed 05-04-20. Repeat every 10 years  Mammogram status: Ordered 02-21-20. Pt provided with contact info and advised to call to schedule appt.   Bone Density status: Completed 01-23-20. Results reflect: Bone density results: NORMAL. Repeat every 5 years.  Lung Cancer Screening: (Low Dose CT Chest recommended if Age 71-80 years, 30 pack-year currently smoking OR have quit w/in 15years.) does not qualify.   Lung Cancer Screening Referral: NA  Additional Screening:  Hepatitis C Screening: does qualify; Completed 12-28-2017  Vision Screening: Recommended annual ophthalmology exams for early detection of glaucoma and other disorders of the eye. Is the patient up to date with their annual eye exam?  Yes  Who is the provider or what is the name of the office in which the patient attends annual eye exams? Dr Hoover BrownsZamora Nicolaus If pt is not established with a provider, would they like to be referred to a provider to establish care? No .   Dental Screening: Recommended annual dental exams for proper oral hygiene  Community Resource Referral / Chronic Care Management: CRR required this visit?  No   CCM required this visit?  No      Plan:     I have personally reviewed and noted the following in the patient's chart:   Medical and social history Use of alcohol, tobacco or illicit drugs  Current medications and supplements including opioid prescriptions.  Functional ability and status Nutritional status Physical  activity Advanced directives List of other physicians Hospitalizations, surgeries, and ER visits in previous 12  months Vitals Screenings to include cognitive, depression, and falls Referrals and appointments  In addition, I have reviewed and discussed with patient certain preventive protocols, quality metrics, and best practice recommendations. A written personalized care plan for preventive services as well as general preventive health recommendations were provided to patient.     Park Breed, CMA   02/20/2021   Nurse Notes:

## 2021-02-27 IMAGING — MG DIGITAL SCREENING BILAT W/ TOMO W/ CAD
8 series · 8 of 24 positions shown · non-contrast
Comparison: Previous exam(s).

CLINICAL DATA: Screening.

EXAM:
DIGITAL SCREENING BILATERAL MAMMOGRAM WITH TOMO AND CAD

[R MLO synth-2D]
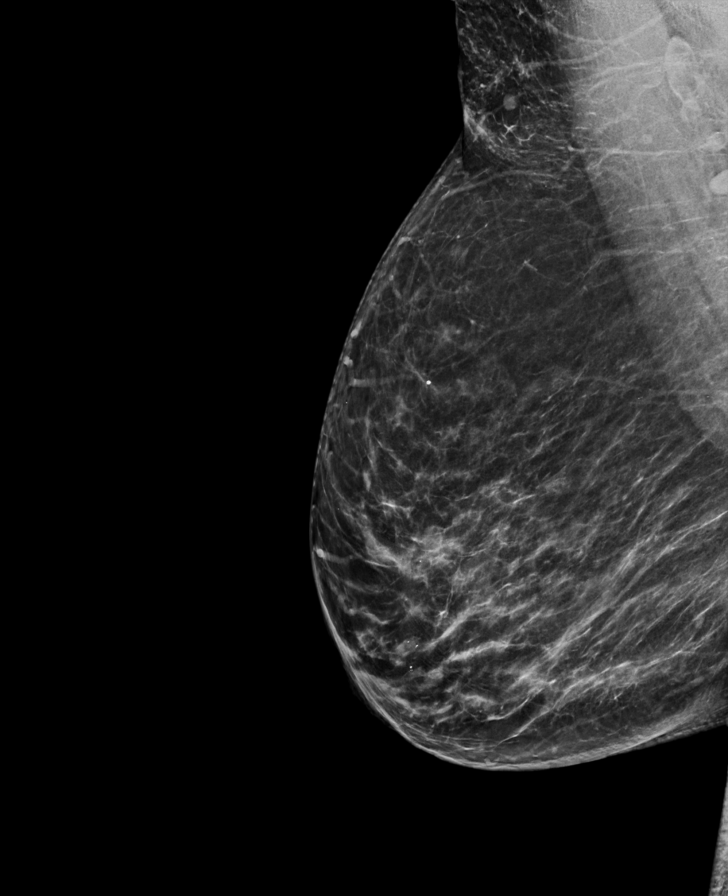

[R CC synth-2D]
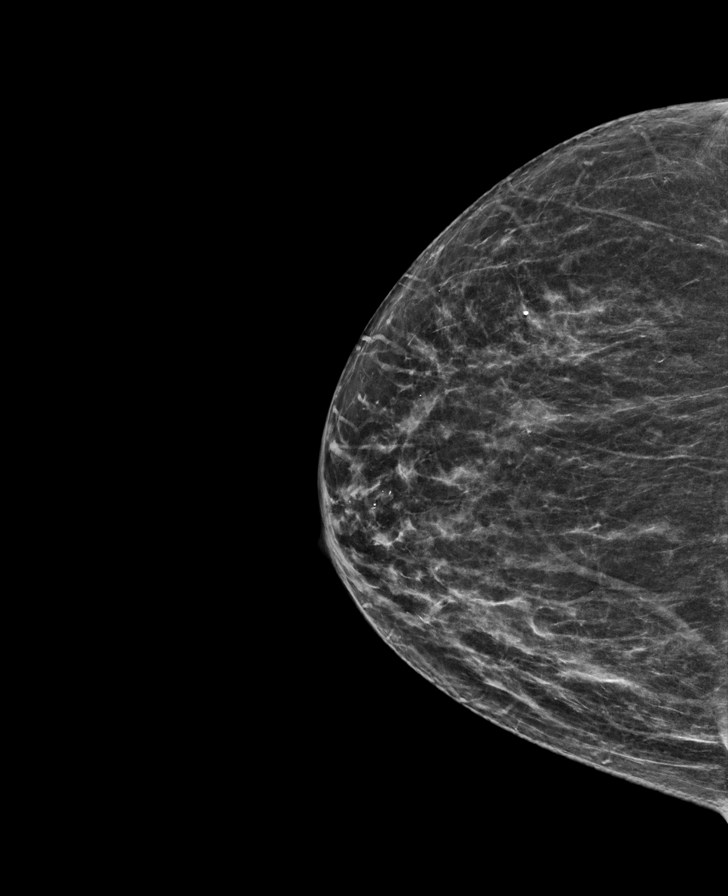

[L MLO synth-2D]
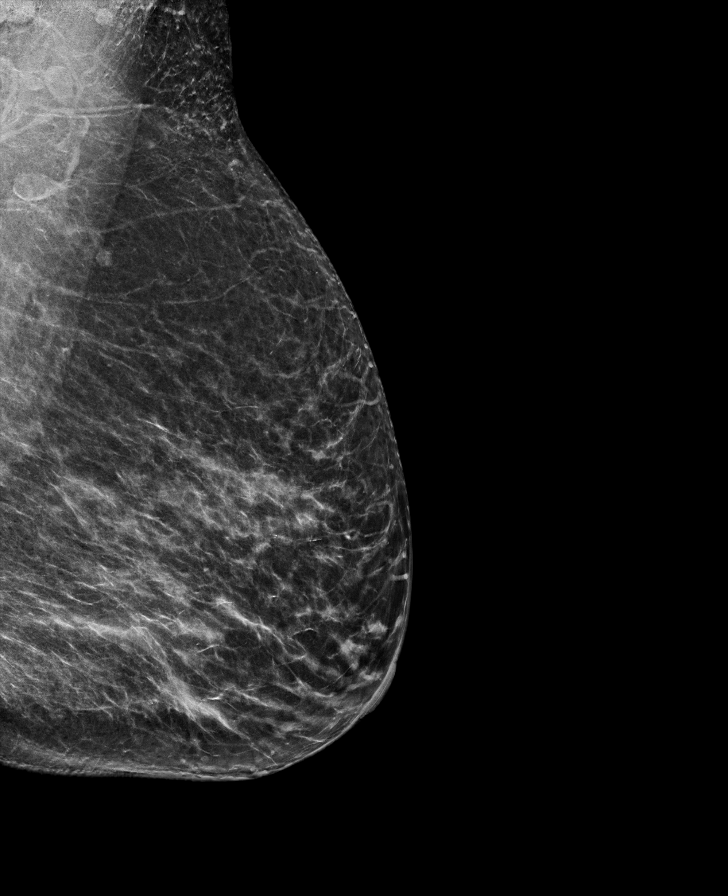

[L CC synth-2D]
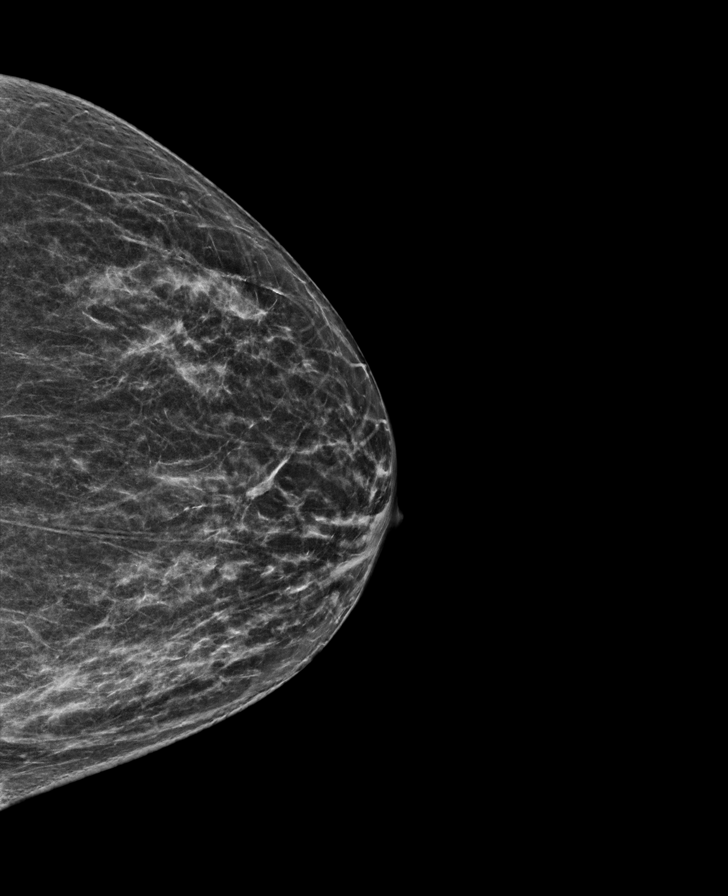

[R MLO tomo · tomo slice 37/72.0]
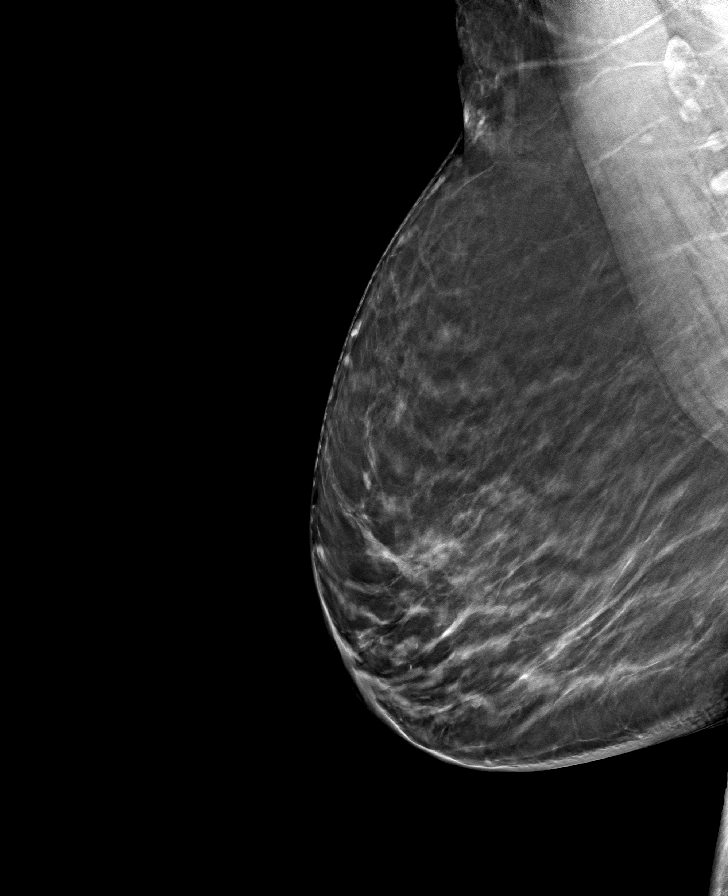

[L MLO tomo · tomo slice 35/70.0]
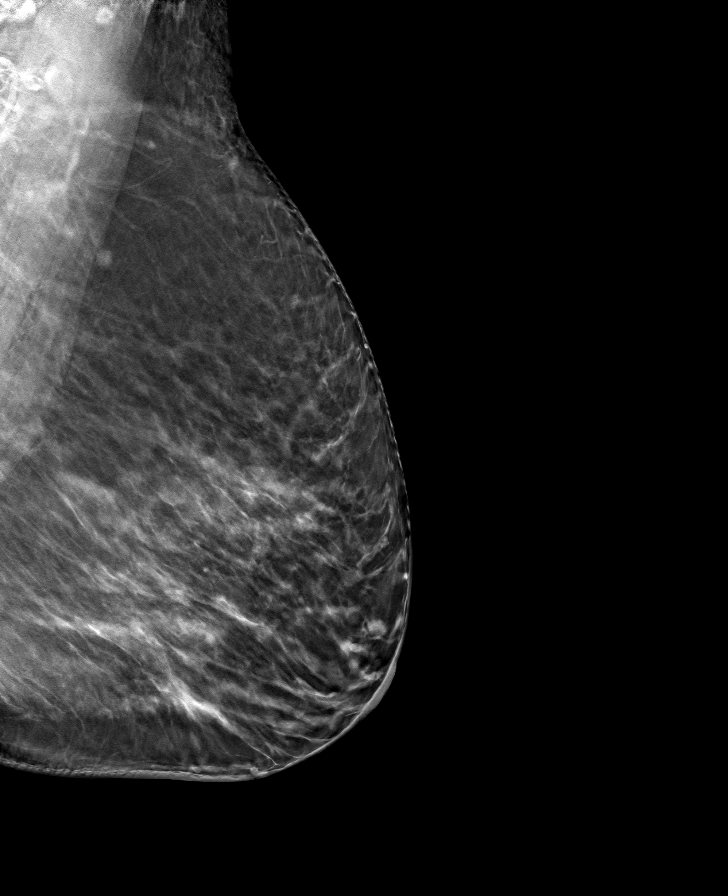

[R CC tomo · tomo slice 31/61.0]
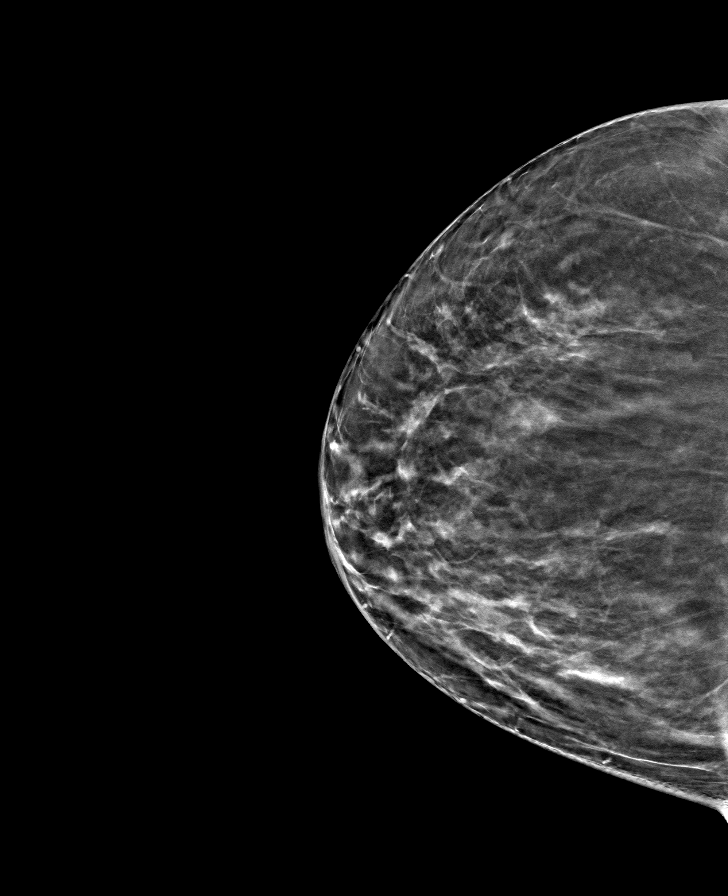

[L CC tomo · tomo slice 31/60.0]
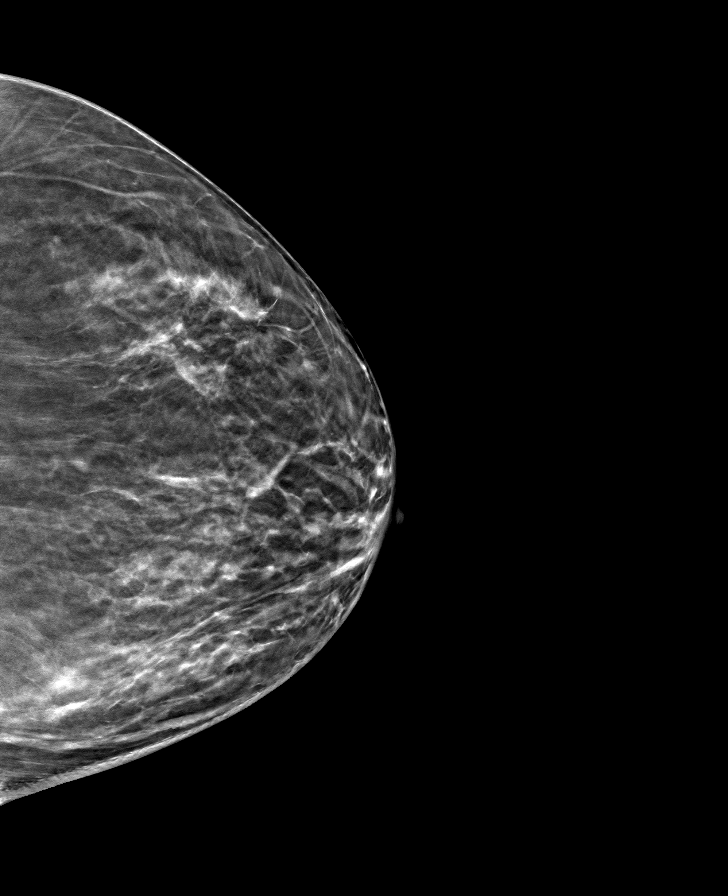

[8 of 24 positions shown; findings below may reference images not displayed]

ACR Breast Density Category c: The breast tissue is heterogeneously
dense, which may obscure small masses.
FINDINGS: There are no findings suspicious for malignancy. Images were
processed with CAD.
IMPRESSION: No mammographic evidence of malignancy. A result letter of this
screening mammogram will be mailed directly to the patient.

RECOMMENDATION:
Screening mammogram in one year. (Code:[id number])

BI-RADS CATEGORY  1: Negative.

## 2021-03-13 ENCOUNTER — Other Ambulatory Visit: Payer: Self-pay | Admitting: Nurse Practitioner

## 2021-03-20 ENCOUNTER — Other Ambulatory Visit: Payer: Self-pay

## 2021-03-20 DIAGNOSIS — E782 Mixed hyperlipidemia: Secondary | ICD-10-CM

## 2021-03-20 MED ORDER — SIMVASTATIN 40 MG PO TABS: ORAL_TABLET | ORAL | 1 refills | Status: DC

## 2021-03-28 ENCOUNTER — Ambulatory Visit (INDEPENDENT_AMBULATORY_CARE_PROVIDER_SITE_OTHER): Payer: PPO | Admitting: Nurse Practitioner

## 2021-03-28 ENCOUNTER — Other Ambulatory Visit: Payer: Self-pay

## 2021-03-28 ENCOUNTER — Encounter: Payer: Self-pay | Admitting: Nurse Practitioner

## 2021-03-28 ENCOUNTER — Encounter: Payer: PPO | Admitting: Family Medicine

## 2021-03-28 VITALS — BP 172/80 | HR 116 | Temp 98.2°F | Ht 63.0 in | Wt 150.0 lb

## 2021-03-28 DIAGNOSIS — R251 Tremor, unspecified: Secondary | ICD-10-CM | POA: Diagnosis not present

## 2021-03-28 DIAGNOSIS — E119 Type 2 diabetes mellitus without complications: Secondary | ICD-10-CM

## 2021-03-28 DIAGNOSIS — R000 Tachycardia, unspecified: Secondary | ICD-10-CM | POA: Diagnosis not present

## 2021-03-28 DIAGNOSIS — I10 Essential (primary) hypertension: Secondary | ICD-10-CM | POA: Diagnosis not present

## 2021-03-28 DIAGNOSIS — B351 Tinea unguium: Secondary | ICD-10-CM | POA: Diagnosis not present

## 2021-03-28 DIAGNOSIS — Z0001 Encounter for general adult medical examination with abnormal findings: Secondary | ICD-10-CM | POA: Diagnosis not present

## 2021-03-28 DIAGNOSIS — E782 Mixed hyperlipidemia: Secondary | ICD-10-CM | POA: Diagnosis not present

## 2021-03-28 MED ORDER — PROPRANOLOL HCL ER 60 MG PO CP24
60.0000 mg | ORAL_CAPSULE | Freq: Every day | ORAL | 1 refills | Status: DC
Start: 2021-03-28 — End: 2021-04-22

## 2021-03-28 NOTE — Patient Instructions (Addendum)
Please have fasting labs drawn today. We will call with lab results.  We will follow-up in about a month to check your liver function if we start terbinafine, otherwise, we will have a lab follow-up in 4 months.

## 2021-03-28 NOTE — Assessment & Plan Note (Signed)
-  considering terbinafine -will wait for LFTs before writing rx

## 2021-03-28 NOTE — Assessment & Plan Note (Signed)
-  checking labs 

## 2021-03-28 NOTE — Assessment & Plan Note (Signed)
Checking labs today

## 2021-03-28 NOTE — Assessment & Plan Note (Signed)
-  possibly related to white coat syndrome -Rx. Propranolol LA

## 2021-03-28 NOTE — Progress Notes (Signed)
New Patient Office Visit  Subjective:  Patient ID: Carolyn Hunter, female    DOB: 07-26-1951  Age: 70 y.o. MRN: [id number]  CC:  Chief Complaint  Patient presents with   Annual Exam    CPE    HPI Carolyn Hunter presents for physical exam Medical history listed below. No recent labs drawn.  Past Medical History:  Diagnosis Date   Anxiety    Cataract    OS   Diabetes mellitus without complication (HCC)    Diabetic retinopathy ([place])    NPDR OU   Hyperlipidemia    Hypertension    Hypertensive retinopathy    OU   Tremors of nervous system    "just when I get in a nervous Situation".    Past Surgical History:  Procedure Laterality Date   CATARACT EXTRACTION W/PHACO Right 10/18/2018   Procedure: CATARACT EXTRACTION PHACO AND INTRAOCULAR LENS PLACEMENT (IOC);  Surgeon: Baruch Goldmann, MD;  Location: [facility];  Service: Ophthalmology;  Laterality: Right;  CDE: 8.11   CATARACT EXTRACTION W/PHACO Left 01/17/2019   Procedure: CATARACT EXTRACTION PHACO AND INTRAOCULAR LENS PLACEMENT (IOC);  Surgeon: Baruch Goldmann, MD;  Location: [facility];  Service: Ophthalmology;  Laterality: Left;  CDE: 6.38   CESAREAN SECTION     1988   COLONOSCOPY WITH PROPOFOL N/A 05/04/2020   Procedure: COLONOSCOPY WITH PROPOFOL;  Surgeon: Harvel Quale, MD;  [facility];  Service: Gastroenterology;  Laterality: N/A;  830   EYE SURGERY Right    Cataract extraction OD   POLYPECTOMY  05/04/2020   Procedure: POLYPECTOMY;  Surgeon: Montez Morita, Quillian Quince, MD;  [facility];  Service: Gastroenterology;;    Family History  Problem Relation Age of Onset   Cancer Mother        breast and colon cancer   Breast cancer Mother    Diabetes Brother    Diabetes Son    Asthma Father        COPD   Diabetes Son     Social History   Socioeconomic History   Marital status: Married    Spouse name: Donnie    Number of children: 4   Years of education: Not on file   Highest  education level: Associate degree: occupational, Hotel manager, or vocational program  Occupational History   Occupation: retired  Tobacco Use   Smoking status: Never   Smokeless tobacco: Never  Scientific laboratory technician Use: Never used  Substance and Sexual Activity   Alcohol use: No   Drug use: No   Sexual activity: Yes    Birth control/protection: Post-menopausal    Comment: 70 years old  Other Topics Concern   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       Eats all food groups. Exercises 30 min a day, four days a week.    Enjoys activities. Watch television. grandkids       Social Determinants of Radio broadcast assistant Strain: Low Risk    Difficulty of Paying Living Expenses: Not hard at all  Food Insecurity: No Food Insecurity   Worried About Charity fundraiser in the Last Year: Never true   Arboriculturist in the Last Year: Never true  Transportation Needs: No Transportation Needs   Lack of Transportation (Medical): No   Lack of Transportation (Non-Medical): No  Physical Activity: Insufficiently Active   Days of Exercise per Week:  3 days   Minutes of Exercise per Session: 30 min  Stress: No Stress Concern Present   Feeling of Stress : Not at all  Social Connections: Moderately Integrated   Frequency of Communication with Friends and Family: More than three times a week   Frequency of Social Gatherings with Friends and Family: More than three times a week   Attends Religious Services: 1 to 4 times per year   Active Member of [employer] or Organizations: No   Attends Archivist Meetings: Never   Marital Status: Married  Human resources officer Violence: Not At Risk   Fear of Current or Ex-Partner: No   Emotionally Abused: No   Physically Abused: No   Sexually Abused: No    ROS Review of Systems  Constitutional: Negative.   HENT: Negative.    Eyes: Negative.   Respiratory: Negative.    Cardiovascular:  Negative.   Gastrointestinal: Negative.   Endocrine: Negative.   Genitourinary: Negative.   Musculoskeletal: Negative.   Skin:        Nail fungus to toes of left foot  Allergic/Immunologic: Negative.   Neurological:  Positive for tremors.  Hematological: Negative.   Psychiatric/Behavioral:  Negative for self-injury and suicidal ideas. The patient is nervous/anxious.        Gets nervous at doctor's office   Objective:   Today's Vitals: BP (!) 172/80 (BP Location: Left Arm, Patient Position: Sitting, Cuff Size: Normal)   Pulse (!) 116   Temp 98.2 F (36.8 C) (Oral)   Ht _0  (1.6 m)   Wt 150 lb (68 kg)   SpO2 96%   BMI 26.57 kg/m   Physical Exam Constitutional:      Appearance: Normal appearance.  HENT:     Head: Normocephalic and atraumatic.     Right Ear: Tympanic membrane, ear canal and external ear normal.     Left Ear: Tympanic membrane, ear canal and external ear normal.     Nose: Nose normal.     Mouth/Throat:     Mouth: Mucous membranes are moist.     Pharynx: Oropharynx is clear.  Eyes:     Extraocular Movements: Extraocular movements intact.     Conjunctiva/sclera: Conjunctivae normal.     Pupils: Pupils are equal, round, and reactive to light.  Cardiovascular:     Rate and Rhythm: Regular rhythm. Tachycardia present.     Pulses: Normal pulses.          Dorsalis pedis pulses are 2+ on the right side and 2+ on the left side.     Heart sounds: Normal heart sounds.  Pulmonary:     Effort: Pulmonary effort is normal.     Breath sounds: Normal breath sounds.  Abdominal:     General: Abdomen is flat. Bowel sounds are normal. There is no distension.     Palpations: Abdomen is soft. There is no mass.     Tenderness: There is no abdominal tenderness. There is no guarding or rebound.     Hernia: No hernia is present.  Musculoskeletal:        General: Normal range of motion.     Cervical back: Normal range of motion and neck supple.  Feet:     Right foot:      Protective Sensation: 10 sites tested.  10 sites sensed.     Skin integrity: Callus present.     Toenail Condition: Right toenails are normal.     Left foot:     Protective Sensation:  10 sites tested.  10 sites sensed.     Skin integrity: Callus present.     Toenail Condition: Fungal disease present. Skin:    General: Skin is warm and dry.     Capillary Refill: Capillary refill takes less than 2 seconds.     Comments: Onychomycosis to toenails of left foot  Neurological:     General: No focal deficit present.     Mental Status: She is alert and oriented to person, place, and time.  Psychiatric:        Mood and Affect: Mood normal.        Behavior: Behavior normal.        Thought Content: Thought content normal.        Judgment: Judgment normal.    Assessment & Plan:   Problem List Items Addressed This Visit       Cardiovascular and Mediastinum   Essential hypertension    BP Readings from Last 3 Encounters:  03/28/21 (!) 172/80  02/20/21 110/80  07/13/20 (!) 154/78  -she states she has great home readings      Relevant Medications   propranolol ER (INDERAL LA) 60 MG 24 hr capsule   Other Relevant Orders   AMB Referral to [facility] Coordinaton     Endocrine   Controlled type 2 diabetes mellitus without complication, without long-term current use of insulin ([place])    Checking labs today      Relevant Orders   CMP14+EGFR   CBC with Differential/Platelet   Lipid Panel With LDL/HDL Ratio   Hemoglobin A1c   Microalbumin / creatinine urine ratio     Musculoskeletal and Integument   Onychomycosis of toenail    -considering terbinafine -will wait for LFTs before writing rx      Relevant Orders   CMP14+EGFR   CBC with Differential/Platelet     Other   HLD (hyperlipidemia)    -checking labs      Relevant Medications   propranolol ER (INDERAL LA) 60 MG 24 hr capsule   Other Relevant Orders   CMP14+EGFR   Lipid Panel With LDL/HDL Ratio   Tremor    -Noted  tremor she reports she has had it for almost all of her life started in her 76s.  She reports that her family members including her father had it has well.  She did not have any trouble picking up things, eating or driving. -Rx. Propranolol; she has HTN and tachycardia, so hopefully this will help with tremor as well      Relevant Medications   propranolol ER (INDERAL LA) 60 MG 24 hr capsule   Other Relevant Orders   TSH   Tachycardia    -possibly related to white coat syndrome -Rx. Propranolol LA      Relevant Medications   propranolol ER (INDERAL LA) 60 MG 24 hr capsule   Other Relevant Orders   CMP14+EGFR   CBC with Differential/Platelet   TSH   Other Visit Diagnoses     Encounter for general adult medical examination with abnormal findings    -  Primary   Relevant Orders   CMP14+EGFR   CBC with Differential/Platelet   Lipid Panel With LDL/HDL Ratio   Hemoglobin A1c   Microalbumin / creatinine urine ratio       Outpatient Encounter Medications as of 03/28/2021  Medication Sig   Aflibercept (EYLEA IO) Inject 1 Dose into the eye every 30 (thirty) days.   amLODipine (NORVASC) 5 MG tablet TAKE 1 TABLET  BY MOUTH EVERY DAY   Bromfenac Sodium (PROLENSA) 0.07 % SOLN Place 1 drop into both eyes in the morning, at noon, in the evening, and at bedtime. (Patient taking differently: Place 1 drop into both eyes in the morning and at bedtime.)   Bromfenac Sodium 0.09 % SOLN Place 1 drop into both eyes 2 (two) times daily.   Cholecalciferol (VITAMIN D3) 50 MCG (2000 UT) TABS Take 2,000 Units by mouth daily.   Liniments (SALONPAS PAIN RELIEF PATCH EX) Place 1 patch onto the skin daily as needed (pain.).   metFORMIN (GLUCOPHAGE) 1000 MG tablet TAKE 1 TABLET BY MOUTH EVERY DAY WITH BREAKFAST   propranolol ER (INDERAL LA) 60 MG 24 hr capsule Take 1 capsule (60 mg total) by mouth daily.   simvastatin (ZOCOR) 40 MG tablet TAKE 1 TABLET BY MOUTH EVERYDAY AT BEDTIME   [DISCONTINUED] aspirin EC  81 MG tablet Take 1 tablet (81 mg total) by mouth daily. (Patient not taking: Reported on 03/28/2021)   [DISCONTINUED] prednisoLONE acetate (PRED FORTE) 1 % ophthalmic suspension INSTILL 1 DROP INTO BOTH EYES 4 TIMES A DAY (Patient not taking: Reported on 03/28/2021)   No facility-administered encounter medications on file as of 03/28/2021.    Follow-up: Return in about 5 weeks (around 05/02/2021) for Med check (terbinafine).   Noreene Larsson, NP

## 2021-03-28 NOTE — Assessment & Plan Note (Signed)
-  Noted tremor she reports she has had it for almost all of her life started in her 7s.  She reports that her family members including her father had it has well.  She did not have any trouble picking up things, eating or driving. -Rx. Propranolol; she has HTN and tachycardia, so hopefully this will help with tremor as well

## 2021-03-28 NOTE — Assessment & Plan Note (Signed)
BP Readings from Last 3 Encounters:  03/28/21 (!) 172/80  02/20/21 110/80  07/13/20 (!) 154/78   -she states she has great home readings

## 2021-03-29 ENCOUNTER — Telehealth: Payer: Self-pay | Admitting: *Deleted

## 2021-03-29 DIAGNOSIS — R251 Tremor, unspecified: Secondary | ICD-10-CM | POA: Diagnosis not present

## 2021-03-29 DIAGNOSIS — Z0001 Encounter for general adult medical examination with abnormal findings: Secondary | ICD-10-CM | POA: Diagnosis not present

## 2021-03-29 DIAGNOSIS — E782 Mixed hyperlipidemia: Secondary | ICD-10-CM | POA: Diagnosis not present

## 2021-03-29 DIAGNOSIS — R000 Tachycardia, unspecified: Secondary | ICD-10-CM | POA: Diagnosis not present

## 2021-03-29 DIAGNOSIS — B351 Tinea unguium: Secondary | ICD-10-CM | POA: Diagnosis not present

## 2021-03-29 DIAGNOSIS — E119 Type 2 diabetes mellitus without complications: Secondary | ICD-10-CM | POA: Diagnosis not present

## 2021-03-29 NOTE — Chronic Care Management (AMB) (Signed)
  Chronic Care Management   Outreach Note  03/29/2021 Name: [name] MRN: [id number] DOB: November 28, 1950  Carolyn Hunter is a 70 y.o. year old female who is a primary care patient of Heather Roberts, NP. I reached out to Moshe Salisbury by phone today in response to a referral sent by Ms. Tammy Sours Budde's Heather Roberts, NP     An unsuccessful telephone outreach was attempted today. The patient was referred to the case management team for assistance with care management and care coordination.   Follow Up Plan: A HIPAA compliant phone message was left for the patient providing contact information and requesting a return call. The care management team will reach out to the patient again over the next 7 days.  If patient returns call to provider office, please advise to call Embedded Care Management Care Guide Misty Stanley at [phone].  Gwenevere Ghazi  Care Guide, Embedded Care Coordination [facility] Management  Direct Dial: [phone]

## 2021-03-31 LAB — CMP14+EGFR
ALT: 6 IU/L (ref 0–32)
AST: 13 IU/L (ref 0–40)
Albumin/Globulin Ratio: 1.6 (ref 1.2–2.2)
Albumin: 4.5 g/dL (ref 3.8–4.8)
Alkaline Phosphatase: 79 IU/L (ref 44–121)
BUN/Creatinine Ratio: 16 (ref 12–28)
BUN: 12 mg/dL (ref 8–27)
Bilirubin Total: 0.5 mg/dL (ref 0.0–1.2)
CO2: 25 mmol/L (ref 20–29)
Calcium: 10 mg/dL (ref 8.7–10.3)
Chloride: 100 mmol/L (ref 96–106)
Creatinine, Ser: 0.77 mg/dL (ref 0.57–1.00)
Globulin, Total: 2.8 g/dL (ref 1.5–4.5)
Glucose: 155 mg/dL — ABNORMAL HIGH (ref 65–99)
Potassium: 4.4 mmol/L (ref 3.5–5.2)
Sodium: 141 mmol/L (ref 134–144)
Total Protein: 7.3 g/dL (ref 6.0–8.5)
eGFR: 84 mL/min/{1.73_m2} (ref >59)

## 2021-03-31 LAB — MICROALBUMIN / CREATININE URINE RATIO
Creatinine, Urine: 125.1 mg/dL
Microalb/Creat Ratio: 9 mg/g creat (ref 0–29)
Microalbumin, Urine: 10.8 ug/mL

## 2021-03-31 LAB — LIPID PANEL WITH LDL/HDL RATIO
Cholesterol, Total: 239 mg/dL — ABNORMAL HIGH (ref 100–199)
HDL: 58 mg/dL (ref >39)
LDL Chol Calc (NIH): 153 mg/dL — ABNORMAL HIGH (ref 0–99)
LDL/HDL Ratio: 2.6 ratio (ref 0.0–3.2)
Triglycerides: 156 mg/dL — ABNORMAL HIGH (ref 0–149)
VLDL Cholesterol Cal: 28 mg/dL (ref 5–40)

## 2021-03-31 LAB — CBC WITH DIFFERENTIAL/PLATELET
Basophils Absolute: 0 10*3/uL (ref 0.0–0.2)
Basos: 1 %
EOS (ABSOLUTE): 0.3 10*3/uL (ref 0.0–0.4)
Eos: 4 %
Hematocrit: 39 % (ref 34.0–46.6)
Hemoglobin: 13.3 g/dL (ref 11.1–15.9)
Immature Grans (Abs): 0 10*3/uL (ref 0.0–0.1)
Immature Granulocytes: 0 %
Lymphocytes Absolute: 2.2 10*3/uL (ref 0.7–3.1)
Lymphs: 37 %
MCH: 31.4 pg (ref 26.6–33.0)
MCHC: 34.1 g/dL (ref 31.5–35.7)
MCV: 92 fL (ref 79–97)
Monocytes Absolute: 0.7 10*3/uL (ref 0.1–0.9)
Monocytes: 11 %
Neutrophils Absolute: 2.9 10*3/uL (ref 1.4–7.0)
Neutrophils: 47 %
Platelets: 234 10*3/uL (ref 150–450)
RBC: 4.24 x10E6/uL (ref 3.77–5.28)
RDW: 11.3 % — ABNORMAL LOW (ref 11.7–15.4)
WBC: 6.1 10*3/uL (ref 3.4–10.8)

## 2021-03-31 LAB — TSH: TSH: 1.42 u[IU]/mL (ref 0.450–4.500)

## 2021-03-31 LAB — HEMOGLOBIN A1C
Est. average glucose Bld gHb Est-mCnc: 151 mg/dL
Hgb A1c MFr Bld: 6.9 % — ABNORMAL HIGH (ref 4.8–5.6)

## 2021-04-01 ENCOUNTER — Other Ambulatory Visit: Payer: Self-pay | Admitting: Nurse Practitioner

## 2021-04-01 DIAGNOSIS — E782 Mixed hyperlipidemia: Secondary | ICD-10-CM

## 2021-04-01 MED ORDER — REPATHA 140 MG/ML ~~LOC~~ SOSY: 140.0000 mg | PREFILLED_SYRINGE | SUBCUTANEOUS | 11 refills | Status: DC

## 2021-04-01 NOTE — Progress Notes (Signed)
-  Rx. Repatha Lab Results  Component Value Date   CHOL 239 (H) 03/29/2021   HDL 58 03/29/2021   LDLCALC 153 (H) 03/29/2021   TRIG 156 (H) 03/29/2021   CHOLHDL 3.9 01/10/2020   -despite simvastatin, LDL is still significantly elevated -will consider pharmacy consult if repatha is too expensive

## 2021-04-01 NOTE — Chronic Care Management (AMB) (Signed)
  Chronic Care Management   Note  04/01/2021 Name: Carolyn Hunter MRN: [id number] DOB: [birth year]  [name] is a 70 y.o. year old female who is a primary care patient of Noreene Larsson, NP. I reached out to Seabron Spates by phone today in response to a referral sent by [name] PCP, Noreene Larsson, NP      [name] was given information about Chronic Care Management services today including:  CCM service includes personalized support from designated clinical staff supervised by her physician, including individualized plan of care and coordination with other care providers 24/7 contact phone numbers for assistance for urgent and routine care needs. Service will only be billed when office clinical staff spend 20 minutes or more in a month to coordinate care. Only one practitioner may furnish and bill the service in a calendar month. The patient may stop CCM services at any time (effective at the end of the month) by phone call to the office staff. The patient will be responsible for cost sharing (co-pay) of up to 20% of the service fee (after annual deductible is met).  Patient did not agree to enrollment in care management services and does not wish to consider at this time.  Follow up plan: Patient declines engagement by the care management team. Appropriate care team members and provider have been notified via electronic communication. The care management team is available to follow up with the patient after provider conversation with the patient regarding recommendation for care management engagement and subsequent re-referral to the care management team.   [facility]  Direct Dial: [phone]

## 2021-04-01 NOTE — Progress Notes (Signed)
Cholesterol is elevated, and blood sugar was elevated, but A1c was ok at 6.9. I sent in Rx. For repatha d/t her bad cholesterol being so elevated. If it is too expensive, let us know and we can do a pharmacy consult to possibly bring the price down/get guidance with patient assistance programs.

## 2021-04-02 ENCOUNTER — Other Ambulatory Visit: Payer: Self-pay | Admitting: Nurse Practitioner

## 2021-04-02 ENCOUNTER — Telehealth: Payer: Self-pay

## 2021-04-02 DIAGNOSIS — B351 Tinea unguium: Secondary | ICD-10-CM

## 2021-04-02 MED ORDER — TERBINAFINE HCL 250 MG PO TABS: 250.0000 mg | ORAL_TABLET | Freq: Every day | ORAL | 0 refills | Status: DC

## 2021-04-02 NOTE — Telephone Encounter (Signed)
Pt informed

## 2021-04-02 NOTE — Progress Notes (Signed)
[facility] Note  04/03/2021     CHIEF COMPLAINT Patient presents for Retina Follow Up   HISTORY OF PRESENT ILLNESS: Carolyn Hunter is a 70 y.o. female who presents to the clinic today for:   HPI     Retina Follow Up   Patient presents with  Diabetic Retinopathy.  Severity is moderate.  Duration of 7 weeks.  Since onset it is stable.  I, the attending physician,  performed the HPI with the patient and updated documentation appropriately.        Comments   Pt here for 7 wk ret f/u for NPDR OU. Pt states vision is the same, no issues or changes reported. No ocular pain or discomfort.       Last edited by Bernarda Caffey, MD on 04/08/2021 12:38 AM.    Pt states left eye vision is doing well, but right eye vision has decreased  Referring physician: Perlie Mayo, NP [place] [place],  [place] [id number]  HISTORICAL INFORzMATION:   Selected notes from the [place] Referral from Dr. Jeb Levering for DM exam   CURRENT MEDICATIONS: Current Outpatient Medications (Ophthalmic Drugs)  Medication Sig   Aflibercept (EYLEA IO) Inject 1 Dose into the eye every 30 (thirty) days.   Bromfenac Sodium (PROLENSA) 0.07 % SOLN Place 1 drop into both eyes in the morning, at noon, in the evening, and at bedtime. (Patient taking differently: Place 1 drop into both eyes in the morning and at bedtime.)   Bromfenac Sodium 0.09 % SOLN Place 1 drop into both eyes 2 (two) times daily.   No current facility-administered medications for this visit. (Ophthalmic Drugs)   Current Outpatient Medications (Other)  Medication Sig   amLODipine (NORVASC) 5 MG tablet TAKE 1 TABLET BY MOUTH EVERY DAY   Cholecalciferol (VITAMIN D3) 50 MCG (2000 UT) TABS Take 2,000 Units by mouth daily.   Evolocumab (REPATHA) 140 MG/ML SOSY Inject 140 mg into the skin every 14 (fourteen) days.   Liniments (SALONPAS PAIN RELIEF PATCH EX) Place 1 patch onto the skin daily  as needed (pain.).   metFORMIN (GLUCOPHAGE) 1000 MG tablet TAKE 1 TABLET BY MOUTH EVERY DAY WITH BREAKFAST   propranolol ER (INDERAL LA) 60 MG 24 hr capsule Take 1 capsule (60 mg total) by mouth daily.   simvastatin (ZOCOR) 40 MG tablet TAKE 1 TABLET BY MOUTH EVERYDAY AT BEDTIME   terbinafine (LAMISIL) 250 MG tablet Take 1 tablet (250 mg total) by mouth daily.   No current facility-administered medications for this visit. (Other)    REVIEW OF SYSTEMS: ROS   Positive for: Neurological, Endocrine, Eyes Negative for: Constitutional, Gastrointestinal, Skin, Genitourinary, Musculoskeletal, HENT, Cardiovascular, Respiratory, Psychiatric, Allergic/Imm, Heme/Lymph Last edited by Kingsley Spittle, COT on 04/03/2021  1:49 PM.      ALLERGIES Allergies  Allergen Reactions   Ace Inhibitors Cough    PAST MEDICAL HISTORY Past Medical History:  Diagnosis Date   Anxiety    Cataract    OS   Diabetes mellitus without complication ([place])    Diabetic retinopathy ([place])    NPDR OU   Hyperlipidemia    Hypertension    Hypertensive retinopathy    OU   Tremors of nervous system    "just when I get in a nervous Situation".   Past Surgical History:  Procedure Laterality Date   CATARACT EXTRACTION W/PHACO Right 10/18/2018   Procedure: CATARACT EXTRACTION PHACO AND INTRAOCULAR LENS PLACEMENT (  [place]);  Surgeon: Baruch Goldmann, MD;  Location: [facility];  Service: Ophthalmology;  Laterality: Right;  CDE: 8.11   CATARACT EXTRACTION W/PHACO Left 01/17/2019   Procedure: CATARACT EXTRACTION PHACO AND INTRAOCULAR LENS PLACEMENT ([facility]);  Surgeon: Baruch Goldmann, MD;  Location: [facility];  Service: Ophthalmology;  Laterality: Left;  CDE: 6.38   CESAREAN SECTION     1988   COLONOSCOPY WITH PROPOFOL N/A 05/04/2020   Procedure: COLONOSCOPY WITH PROPOFOL;  Surgeon: Harvel Quale, MD;  Location: [facility];  Service: Gastroenterology;  Laterality: N/A;  830   EYE SURGERY Right    Cataract extraction OD    POLYPECTOMY  05/04/2020   Procedure: POLYPECTOMY;  Surgeon: Montez Morita, Quillian Quince, MD;  Location: [facility];  Service: Gastroenterology;;    FAMILY HISTORY Family History  Problem Relation Age of Onset   Cancer Mother        breast and colon cancer   Breast cancer Mother    Diabetes Brother    Diabetes Son    Asthma Father        COPD   Diabetes Son     SOCIAL HISTORY Social History   Tobacco Use   Smoking status: Never   Smokeless tobacco: Never  Vaping Use   Vaping Use: Never used  Substance Use Topics   Alcohol use: No   Drug use: No         OPHTHALMIC EXAM:  Base Eye Exam     Visual Acuity (Snellen - Linear)       Right Left   Dist [place] 20/30 20/20 -1   Dist ph [place] NI          Tonometry (Tonopen, 1:55 PM)       Right Left   Pressure 17 11         Pupils       Dark Light Shape React APD   Right 2 1 Round Minimal None   Left 2 1 Round Minimal None         Visual Fields (Counting fingers)       Left Right    Full Full         Extraocular Movement       Right Left    Full, Ortho Full, Ortho         Neuro/Psych     Oriented x3: Yes   Mood/Affect: Normal         Dilation     Both eyes: 1.0% Mydriacyl, 2.5% Phenylephrine @ 1:55 PM           Slit Lamp and Fundus Exam     Slit Lamp Exam       Right Left   Lids/Lashes Dermatochalasis - upper lid, Meibomian gland dysfunction Dermatochalasis - upper lid, Meibomian gland dysfunction   Conjunctiva/Sclera White and quiet White and quiet   Cornea Mild Arcus, Well healed temporal cataract wounds, trace Punctate epithelial erosions Mild Arcus, EBMD, trace Punctate epithelial erosions, Well healed temporal cataract wounds, mild Debris in tear film   Anterior Chamber Deep, 1+cell/pigment Deep and quiet   Iris Round and dilated, No NVI, PPM nasally Round and dilated, No NVI   Lens PC IOL in good position PC IOL in good position, trace PCO   Vitreous Vitreous syneresis, mild  vitreous condensations, Posterior vitreous detachment, old, white VH settled inferiorly Vitreous syneresis         Fundus Exam       Right Left   Disc  mild pallor, Sharp rim mild pallor, Sharp rim   C/D Ratio 0.4 0.4   Macula Flat, blunted foveal reflex, interval re-development of central edema, trace exudates temporal macula, epiretinal membrane Flat, blunted foveal reflex, epiretinal membrane, scattered MA, RPE mottling and clumping, cluster of MA/exudate temporal macula with stable improvement in cystic changes   Vessels attenuated, Tortuous Vascular attenuation, Tortuous   Periphery Attached, scattered MAs, DBH, exudates inferior to disc - resolved, inferior retinal partially obscured by vitreous condensations / old VH attached, scattered DBH greatest nasally and IN, focal exudates nasal midzone            IMAGING AND PROCEDURES  Imaging and Procedures for 12/10/17  OCT, Retina - OU - Both Eyes       Right Eye Quality was good. Central Foveal Thickness: 460. Progression has worsened. Findings include intraretinal hyper-reflective material, no SRF, outer retinal atrophy, abnormal foveal contour, intraretinal fluid (Interval re-development of central IRF).   Left Eye Quality was good. Central Foveal Thickness: 365. Progression has been stable. Findings include abnormal foveal contour, intraretinal hyper-reflective material, vitreomacular adhesion , epiretinal membrane, no IRF, no SRF (stable improvement in IRF -- just trace cystic changes remain, mild persistent IRHM).   Notes **Images taken, stored on drive**  Diagnosis / Impression:  DME OU (OD>OS) OD: Interval re-development of central IRF OS: stable improvement in IRF -- just trace cystic changes remain, mild persistent IRHM  Clinical management:  See below  Abbreviations: NFP - Normal foveal profile. CME - cystoid macular edema. PED - pigment epithelial detachment. IRF - intraretinal fluid. SRF - subretinal fluid. EZ  - ellipsoid zone. ERM - epiretinal membrane. ORA - outer retinal atrophy. ORT - outer retinal tubulation. SRHM - subretinal hyper-reflective material       Intravitreal Injection, Pharmacologic Agent - OD - Right Eye       Time Out 04/03/2021. 3:01 PM. Confirmed correct patient, procedure, site, and patient consented.   Anesthesia Topical anesthesia was used. Anesthetic medications included Lidocaine 2%, Proparacaine 0.5%.   Procedure Preparation included 5% betadine to ocular surface, eyelid speculum. A (32g) needle was used.   Injection: 2 mg aflibercept 2 MG/0.05ML   Route: Intravitreal, Site: Right Eye   NDC: [id number], Lot: [id number], Expiration date: 01/08/2022, Waste: 0.05 mL   Post-op Post injection exam found visual acuity of at least counting fingers. The patient tolerated the procedure well. There were no complications. The patient received written and verbal post procedure care education. Post injection medications were not given.             ASSESSMENT/PLAN:    ICD-10-CM   1. 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 Posterior vitreous detachment of right eye  H43.811     3. Epiretinal membrane (ERM) of left eye  H35.372     4. Essential hypertension  I10     5. Hypertensive retinopathy of both eyes  H35.033     6. Pseudophakia of both eyes  Z96.1     7. Retinal edema  H35.81 OCT, Retina - OU - Both Eyes        1,2. Moderate nonproliferative diabetic retinopathy, both eyes  - Diabetic macular edema, both eyes -- relatively mild  - S/P IVA OD #1 (12.17.18), #2 (01.28.19), #3 (02.27.19), #4 (04.03.19), #5 (05.01.19), #6 (  05.27.20), #7 (07.08.20)  - in reviewing her case, there was minimal response to IVA in OCT or BCVA  - S/P IVTA OD #1 (05.29.19), #2  (07.24.19), #3 (09.04.19), #4 (12.04.19), #5 (02.26.20), # 6 (08.05.20), # 7(09.30.20), #8 (12.23.20)  - s/p IVE OD #1 (03.03.21), #2 (03.31.21), #3 (04.28.21), #4 (05.28.21), #5 (08.04.21), #6 (09.08.21), #7 (10.06.21), #8 (12.15.21), #9 (02.09.22), #10 (05.11.22)  - s/p IVE OS #1 (04.28.21), #2 (05.28.21), #3 (12.15.21), #4 (02.09.22)  - S/P focal laser OS (09.02.20)  - s/p focal laser OD (11.17.21)  - OCT shows OD: Interval re-development of central IRF; OS: stable improvement in IRF -- just trace cystic changes remain, mild persistent IRHM   - BCVA 20/30 OD (decreased) 20/20 OS  - recommend IVE OD #11 today, 08.24.22  - pt wishes to proceed  - RBA of procedure discussed, questions answered - informed consent obtained and signed - see procedure note  - cont Bromfenac BID OU  - continue PF BID OU  - f/u 5-6 weeks -- DFE/OCT possible injection(s)  3. PVD / vitreous syneresis OD  - Discussed findings and prognosis  - No RT or RD on 360 scleral depressed exam  - Reviewed s/s of RT/RD  - Strict return precautions for any such RT/RD signs/symptoms   4. Epiretinal membrane, left eye  - mild ERM with VMA/VMT component - BCVA remains 20/20 - still asymptomatic, no metamorphopsia - no indication for surgery at this time - monitor for now  5,6. Hypertensive retinopathy OU  - discussed importance of tight BP control  - monitor  7. Pseudophakia OU  - s/p CE/IOL OU (Dr. Marisa Hua, [place])  - beautiful surgeries, doing well  - monitor   Ophthalmic Meds Ordered this visit:  Meds ordered this encounter  Medications   aflibercept (EYLEA) SOLN 2 mg        Return for f/u 5-6 weeks, NPDR OU, DFE, OCT.  There are no Patient Instructions on file for this visit.  This document serves as a record of services personally performed by Gardiner Sleeper, MD, PhD. It was created on their behalf by Orvan Falconer, an ophthalmic technician. The creation of this record is the provider's  dictation and/or activities during the visit.    Electronically signed by: Orvan Falconer, OA, 04/08/21  12:42 AM   Gardiner Sleeper, M.D., Ph.D. Diseases & Surgery of the Retina and [place] 04/03/2021   I have reviewed the above documentation for accuracy and completeness, and I agree with the above. Gardiner Sleeper, M.D., Ph.D. 04/08/21 12:47 AM   Abbreviations: M myopia (nearsighted); A astigmatism; H hyperopia (farsighted); P presbyopia; Mrx spectacle prescription;  CTL contact lenses; OD right eye; OS left eye; OU both eyes  XT exotropia; ET esotropia; PEK punctate epithelial keratitis; PEE punctate epithelial erosions; DES dry eye syndrome; MGD meibomian gland dysfunction; ATs artificial tears; PFAT's preservative free artificial tears; [place] nuclear sclerotic cataract; PSC posterior subcapsular cataract; ERM epi-retinal membrane; PVD posterior vitreous detachment; RD retinal detachment; DM diabetes mellitus; DR diabetic retinopathy; NPDR non-proliferative diabetic retinopathy; PDR proliferative diabetic retinopathy; CSME clinically significant macular edema; DME diabetic macular edema; dbh dot blot hemorrhages; CWS cotton wool spot; POAG primary open angle glaucoma; C/D cup-to-disc ratio; HVF humphrey visual field; GVF goldmann visual field; OCT optical coherence tomography; IOP intraocular pressure; BRVO Branch retinal vein occlusion; CRVO central retinal vein occlusion; CRAO central retinal artery occlusion; BRAO branch retinal artery occlusion; RT retinal tear; SB  scleral buckle; PPV pars plana vitrectomy; VH Vitreous hemorrhage; PRP panretinal laser photocoagulation; IVK intravitreal kenalog; VMT vitreomacular traction; MH Macular hole;  NVD neovascularization of the disc; NVE neovascularization elsewhere; AREDS age related eye disease study; ARMD age related macular degeneration; POAG primary open angle glaucoma; EBMD epithelial/anterior basement membrane  dystrophy; ACIOL anterior chamber intraocular lens; IOL intraocular lens; PCIOL posterior chamber intraocular lens; Phaco/IOL phacoemulsification with intraocular lens placement; [place] photorefractive keratectomy; LASIK laser assisted in situ keratomileusis; HTN hypertension; DM diabetes mellitus; COPD chronic obstructive pulmonary disease

## 2021-04-02 NOTE — Telephone Encounter (Signed)
Pt wanted to know since labs were okay, can she get the medication for the toe fungus sent in?

## 2021-04-02 NOTE — Telephone Encounter (Signed)
I sent in the terbinafine for her. She has the lab appointment in about a month and we will recheck her liver enzymes then.

## 2021-04-03 ENCOUNTER — Ambulatory Visit (INDEPENDENT_AMBULATORY_CARE_PROVIDER_SITE_OTHER): Payer: PPO | Admitting: Ophthalmology

## 2021-04-03 ENCOUNTER — Other Ambulatory Visit: Payer: Self-pay

## 2021-04-03 ENCOUNTER — Encounter (INDEPENDENT_AMBULATORY_CARE_PROVIDER_SITE_OTHER): Payer: Self-pay | Admitting: Ophthalmology

## 2021-04-03 DIAGNOSIS — I10 Essential (primary) hypertension: Secondary | ICD-10-CM

## 2021-04-03 DIAGNOSIS — H3581 Retinal edema: Secondary | ICD-10-CM | POA: Diagnosis not present

## 2021-04-03 DIAGNOSIS — H35372 Puckering of macula, left eye: Secondary | ICD-10-CM

## 2021-04-03 DIAGNOSIS — H35033 Hypertensive retinopathy, bilateral: Secondary | ICD-10-CM | POA: Diagnosis not present

## 2021-04-03 DIAGNOSIS — H43811 Vitreous degeneration, right eye: Secondary | ICD-10-CM

## 2021-04-03 DIAGNOSIS — Z961 Presence of intraocular lens: Secondary | ICD-10-CM | POA: Diagnosis not present

## 2021-04-03 DIAGNOSIS — E113313 Type 2 diabetes mellitus with moderate nonproliferative diabetic retinopathy with macular edema, bilateral: Secondary | ICD-10-CM | POA: Diagnosis not present

## 2021-04-08 ENCOUNTER — Encounter (INDEPENDENT_AMBULATORY_CARE_PROVIDER_SITE_OTHER): Payer: Self-pay | Admitting: Ophthalmology

## 2021-04-08 MED ORDER — AFLIBERCEPT 2MG/0.05ML IZ SOLN FOR KALEIDOSCOPE
2.0000 mg | INTRAVITREAL | Status: AC | PRN
  Administered 2021-04-03: 2 mg via INTRAVITREAL

## 2021-04-17 ENCOUNTER — Other Ambulatory Visit: Payer: Self-pay | Admitting: Nurse Practitioner

## 2021-04-22 ENCOUNTER — Other Ambulatory Visit: Payer: Self-pay | Admitting: Nurse Practitioner

## 2021-04-22 DIAGNOSIS — R251 Tremor, unspecified: Secondary | ICD-10-CM

## 2021-04-22 DIAGNOSIS — R000 Tachycardia, unspecified: Secondary | ICD-10-CM

## 2021-05-02 ENCOUNTER — Ambulatory Visit (INDEPENDENT_AMBULATORY_CARE_PROVIDER_SITE_OTHER): Payer: PPO | Admitting: Nurse Practitioner

## 2021-05-02 ENCOUNTER — Other Ambulatory Visit: Payer: Self-pay

## 2021-05-02 ENCOUNTER — Encounter: Payer: Self-pay | Admitting: Nurse Practitioner

## 2021-05-02 VITALS — BP 184/82 | HR 108 | Temp 98.2°F | Ht 63.0 in | Wt 147.0 lb

## 2021-05-02 DIAGNOSIS — E782 Mixed hyperlipidemia: Secondary | ICD-10-CM

## 2021-05-02 DIAGNOSIS — I10 Essential (primary) hypertension: Secondary | ICD-10-CM | POA: Diagnosis not present

## 2021-05-02 DIAGNOSIS — R251 Tremor, unspecified: Secondary | ICD-10-CM | POA: Diagnosis not present

## 2021-05-02 DIAGNOSIS — E119 Type 2 diabetes mellitus without complications: Secondary | ICD-10-CM | POA: Diagnosis not present

## 2021-05-02 NOTE — Assessment & Plan Note (Addendum)
BP Readings from Last 3 Encounters:  05/02/21 (!) 184/82  03/28/21 (!) 172/80  02/20/21 110/80   -BP more elevated today, but home BPs are excellent on her cuff -she has elevated reading on her cuff today that she took while in-office, but home readings are 120-130s/70-80s -has white coat syndrome

## 2021-05-02 NOTE — Assessment & Plan Note (Signed)
-  was prescribed repatha at last OV -she states [facility] didn't have it -referral to pharmacist to help with this

## 2021-05-02 NOTE — Assessment & Plan Note (Signed)
-  present today -she stopped taking propranolol after 1 dose -she states it only occurs in healthcare settings d/t her anxiety and feeling "terrified".

## 2021-05-02 NOTE — Patient Instructions (Signed)
Please have fasting labs drawn 2-3 days prior to your appointment so we can discuss the results during your office visit.  

## 2021-05-02 NOTE — Progress Notes (Signed)
Acute Office Visit  Subjective:    Patient ID: Carolyn Hunter, female    DOB: 1950/11/17, 70 y.o.   MRN: [id number]  Chief Complaint  Patient presents with   Hypertension    Follow up    HPI Patient is in today for f/u for HTN. At her OV 1 month ago, she had BP 172/80, and she was started on propranolol (also has essential tremor). She has not been taking propranolol.  She states that she took one propranolol and it made her throat feel funny. She brought in her BP cuff, and her home BPs are wonderful ar 120-130s/70-80s.   Past Medical History:  Diagnosis Date   Anxiety    Cataract    OS   Diabetes mellitus without complication (HCC)    Diabetic retinopathy ([place])    NPDR OU   Hyperlipidemia    Hypertension    Hypertensive retinopathy    OU   Tremors of nervous system    "just when I get in a nervous Situation".    Past Surgical History:  Procedure Laterality Date   CATARACT EXTRACTION W/PHACO Right 10/18/2018   Procedure: CATARACT EXTRACTION PHACO AND INTRAOCULAR LENS PLACEMENT (IOC);  Surgeon: Baruch Goldmann, MD;  Location: [facility];  Service: Ophthalmology;  Laterality: Right;  CDE: 8.11   CATARACT EXTRACTION W/PHACO Left 01/17/2019   Procedure: CATARACT EXTRACTION PHACO AND INTRAOCULAR LENS PLACEMENT (IOC);  Surgeon: Baruch Goldmann, MD;  Location: [facility];  Service: Ophthalmology;  Laterality: Left;  CDE: 6.38   CESAREAN SECTION     1988   COLONOSCOPY WITH PROPOFOL N/A 05/04/2020   Procedure: COLONOSCOPY WITH PROPOFOL;  Surgeon: Harvel Quale, MD;  Location: [facility];  Service: Gastroenterology;  Laterality: N/A;  830   EYE SURGERY Right    Cataract extraction OD   POLYPECTOMY  05/04/2020   Procedure: POLYPECTOMY;  Surgeon: Montez Morita, Quillian Quince, MD;  Location: [facility];  Service: Gastroenterology;;    Family History  Problem Relation Age of Onset   Cancer Mother        breast and colon cancer   Breast cancer Mother    Diabetes Brother     Diabetes Son    Asthma Father        COPD   Diabetes Son     Social History   Socioeconomic History   Marital status: Married    Spouse name: Donnie    Number of children: 4   Years of education: Not on file   Highest education level: Associate degree: occupational, Hotel manager, or vocational program  Occupational History   Occupation: retired  Tobacco Use   Smoking status: Never   Smokeless tobacco: Never  Scientific laboratory technician Use: Never used  Substance and Sexual Activity   Alcohol use: No   Drug use: No   Sexual activity: Yes    Birth control/protection: Post-menopausal    Comment: 70 years old  Other Topics Concern   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       Eats all food groups. Exercises 30 min a day, four days a week.    Enjoys activities. Watch television. grandkids       Social Determinants of Radio broadcast assistant Strain: Low Risk    Difficulty of Paying Living Expenses: Not hard at all  Food Insecurity: No Food Insecurity   Worried About Charity fundraiser in the  Last Year: Never true   Ran Out of Food in the Last Year: Never true  Transportation Needs: No Transportation Needs   Lack of Transportation (Medical): No   Lack of Transportation (Non-Medical): No  Physical Activity: Insufficiently Active   Days of Exercise per Week: 3 days   Minutes of Exercise per Session: 30 min  Stress: No Stress Concern Present   Feeling of Stress : Not at all  Social Connections: Moderately Integrated   Frequency of Communication with Friends and Family: More than three times a week   Frequency of Social Gatherings with Friends and Family: More than three times a week   Attends Religious Services: 1 to 4 times per year   Active Member of [employer] or Organizations: No   Attends Archivist Meetings: Never   Marital Status: Married  Human resources officer Violence: Not At Risk   Fear of Current or  Ex-Partner: No   Emotionally Abused: No   Physically Abused: No   Sexually Abused: No    Outpatient Medications Prior to Visit  Medication Sig Dispense Refill   Aflibercept (EYLEA IO) Inject 1 Dose into the eye every 30 (thirty) days.     amLODipine (NORVASC) 5 MG tablet TAKE 1 TABLET BY MOUTH EVERY DAY 90 tablet 0   Cholecalciferol (VITAMIN D3) 50 MCG (2000 UT) TABS Take 2,000 Units by mouth daily.     Liniments (SALONPAS PAIN RELIEF PATCH EX) Place 1 patch onto the skin daily as needed (pain.).     metFORMIN (GLUCOPHAGE) 1000 MG tablet TAKE 1 TABLET BY MOUTH EVERY DAY WITH BREAKFAST 90 tablet 1   simvastatin (ZOCOR) 40 MG tablet TAKE 1 TABLET BY MOUTH EVERYDAY AT BEDTIME 90 tablet 1   Evolocumab (REPATHA) 140 MG/ML SOSY Inject 140 mg into the skin every 14 (fourteen) days. (Patient not taking: Reported on 05/02/2021) 2.1 mL 11   Bromfenac Sodium (PROLENSA) 0.07 % SOLN Place 1 drop into both eyes in the morning, at noon, in the evening, and at bedtime. (Patient not taking: Reported on 05/02/2021) 3 mL 5   Bromfenac Sodium 0.09 % SOLN Place 1 drop into both eyes 2 (two) times daily. (Patient not taking: Reported on 05/02/2021) 4 mL 3   propranolol ER (INDERAL LA) 60 MG 24 hr capsule TAKE 1 CAPSULE BY MOUTH EVERY DAY (Patient not taking: Reported on 05/02/2021) 30 capsule 1   terbinafine (LAMISIL) 250 MG tablet Take 1 tablet (250 mg total) by mouth daily. (Patient not taking: Reported on 05/02/2021) 84 tablet 0   No facility-administered medications prior to visit.    Allergies  Allergen Reactions   Ace Inhibitors Cough    Review of Systems  Constitutional: Negative.   Respiratory: Negative.    Cardiovascular: Negative.   Musculoskeletal: Negative.   Neurological:  Positive for tremors.       She states tremors only happen at [employer] when she feels "terrified"  Psychiatric/Behavioral:  Negative for self-injury and suicidal ideas. The patient is nervous/anxious.        Objective:    Physical Exam Constitutional:      Appearance: Normal appearance.  Cardiovascular:     Rate and Rhythm: Regular rhythm. Tachycardia present.     Pulses: Normal pulses.     Heart sounds: Normal heart sounds.  Pulmonary:     Effort: Pulmonary effort is normal.     Breath sounds: Normal breath sounds.  Neurological:     Mental Status: She is alert and oriented to person,  place, and time.     Cranial Nerves: No cranial nerve deficit.     Motor: No weakness.     Gait: Gait normal.     Comments: Global tremors present  Psychiatric:     Comments: Anxious affect    BP (!) 184/82 (BP Location: Left Arm, Patient Position: Sitting, Cuff Size: Large)   Pulse (!) 108   Temp 98.2 F (36.8 C) (Oral)   Ht 5' 3"  (1.6 m)   Wt 147 lb (66.7 kg)   SpO2 97%   BMI 26.04 kg/m  Wt Readings from Last 3 Encounters:  05/02/21 147 lb (66.7 kg)  03/28/21 150 lb (68 kg)  02/20/21 150 lb (68 kg)    Health Maintenance Due  Topic Date Due   COVID-19 Vaccine (4 - Booster for Moderna series) 10/07/2020    There are no preventive care reminders to display for this patient.   Lab Results  Component Value Date   TSH 1.420 03/29/2021   Lab Results  Component Value Date   WBC 6.1 03/29/2021   HGB 13.3 03/29/2021   HCT 39.0 03/29/2021   MCV 92 03/29/2021   PLT 234 03/29/2021   Lab Results  Component Value Date   NA 141 03/29/2021   K 4.4 03/29/2021   CO2 25 03/29/2021   GLUCOSE 155 (H) 03/29/2021   BUN 12 03/29/2021   CREATININE 0.77 03/29/2021   BILITOT 0.5 03/29/2021   ALKPHOS 79 03/29/2021   AST 13 03/29/2021   ALT 6 03/29/2021   PROT 7.3 03/29/2021   ALBUMIN 4.5 03/29/2021   CALCIUM 10.0 03/29/2021   ANIONGAP 7 10/13/2018   EGFR 84 03/29/2021   Lab Results  Component Value Date   CHOL 239 (H) 03/29/2021   Lab Results  Component Value Date   HDL 58 03/29/2021   Lab Results  Component Value Date   LDLCALC 153 (H) 03/29/2021   Lab Results  Component  Value Date   TRIG 156 (H) 03/29/2021   Lab Results  Component Value Date   CHOLHDL 3.9 01/10/2020   Lab Results  Component Value Date   HGBA1C 6.9 (H) 03/29/2021       Assessment & Plan:   Problem List Items Addressed This Visit       Cardiovascular and Mediastinum   White coat syndrome with hypertension    -says she gets "terrified" in healthcare settings -she stopped propranolol d/t side effects- throat felt funny, but she states she was anxious after reading the side effects      Essential hypertension - Primary    BP Readings from Last 3 Encounters:  05/02/21 (!) 184/82  03/28/21 (!) 172/80  02/20/21 110/80  -BP more elevated today, but home BPs are excellent on her cuff -she has elevated reading on her cuff today that she took while in-office, but home readings are 120-130s/70-80s -has white coat syndrome      Relevant Orders   CBC with Differential/Platelet   CMP14+EGFR   Lipid Panel With LDL/HDL Ratio     Endocrine   Controlled type 2 diabetes mellitus without complication, without long-term current use of insulin (HCC)   Relevant Orders   CBC with Differential/Platelet   CMP14+EGFR   Lipid Panel With LDL/HDL Ratio   Hemoglobin A1c     Other   HLD (hyperlipidemia)    -was prescribed repatha at last OV -she states [facility] didn't have it -referral to pharmacist to help with this      Relevant Orders  AMB Referral to [facility] Coordinaton   CMP14+EGFR   Lipid Panel With LDL/HDL Ratio   Tremor    -present today -she stopped taking propranolol after 1 dose -she states it only occurs in healthcare settings d/t her anxiety and feeling "terrified".        No orders of the defined types were placed in this encounter.    Noreene Larsson, NP

## 2021-05-02 NOTE — Assessment & Plan Note (Signed)
-  says she gets "terrified" in healthcare settings -she stopped propranolol d/t side effects- throat felt funny, but she states she was anxious after reading the side effects

## 2021-05-06 ENCOUNTER — Telehealth: Payer: Self-pay | Admitting: *Deleted

## 2021-05-06 NOTE — Chronic Care Management (AMB) (Addendum)
  Chronic Care Management   Note  05/06/2021 Name: LATANJA LEHENBAUER MRN: [id number] DOB: 07-24-1951  MATTALYNN CRANDLE is a 70 y.o. year old female who is a primary care patient of Noreene Larsson, NP. I reached out to Seabron Spates by phone today in response to a referral sent by Ms. Lowella Petties Moone's PCP.  Ms. Corson was given information about Chronic Care Management services today including:  CCM service includes personalized support from designated clinical staff supervised by her physician, including individualized plan of care and coordination with other care providers 24/7 contact phone numbers for assistance for urgent and routine care needs. Service will only be billed when office clinical staff spend 20 minutes or more in a month to coordinate care. Only one practitioner may furnish and bill the service in a calendar month. The patient may stop CCM services at any time (effective at the end of the month) by phone call to the office staff. The patient is responsible for co-pay (up to 20% after annual deductible is met) if co-pay is required by the individual health plan.   Patient agreed to services and verbal consent obtained.   Follow up plan: Telephone appointment with care management team member scheduled for:05/28/21  [place] [facility]  Direct Dial: [phone]

## 2021-05-14 NOTE — Progress Notes (Signed)
[facility] - Clinic Note  05/15/2021     CHIEF COMPLAINT Patient presents for Retina Follow Up   HISTORY OF PRESENT ILLNESS: Carolyn Hunter is a 70 y.o. female who presents to the clinic today for:   HPI     Retina Follow Up   Patient presents with  Diabetic Retinopathy.  In both eyes.  This started 6 weeks ago.  I, the attending physician,  performed the HPI with the patient and updated documentation appropriately.        Comments   Patient here for 6 weeks retina follow up for NPDR OU. Patient states vision about the same. No eye pain.       Last edited by Rennis Chris, MD on 05/15/2021  5:15 PM.     Referring physician: Daisy Lazar, DO [address] [place],  [place] [address]  HISTORICAL INFORzMATION:   Selected notes from the MEDICAL RECORD NUMBER Referral from Dr. Fabienne Bruns for DM exam   CURRENT MEDICATIONS: Current Outpatient Medications (Ophthalmic Drugs)  Medication Sig   Aflibercept (EYLEA IO) Inject 1 Dose into the eye every 30 (thirty) days.   No current facility-administered medications for this visit. (Ophthalmic Drugs)   Current Outpatient Medications (Other)  Medication Sig   amLODipine (NORVASC) 5 MG tablet TAKE 1 TABLET BY MOUTH EVERY DAY   Cholecalciferol (VITAMIN D3) 50 MCG (2000 UT) TABS Take 2,000 Units by mouth daily.   Evolocumab (REPATHA) 140 MG/ML SOSY Inject 140 mg into the skin every 14 (fourteen) days. (Patient not taking: Reported on 05/02/2021)   Liniments (SALONPAS PAIN RELIEF PATCH EX) Place 1 patch onto the skin daily as needed (pain.).   metFORMIN (GLUCOPHAGE) 1000 MG tablet TAKE 1 TABLET BY MOUTH EVERY DAY WITH BREAKFAST   simvastatin (ZOCOR) 40 MG tablet TAKE 1 TABLET BY MOUTH EVERYDAY AT BEDTIME   No current facility-administered medications for this visit. (Other)   REVIEW OF SYSTEMS: ROS   Positive for: Neurological, Endocrine, Eyes Negative for: Constitutional, Gastrointestinal, Skin,  Genitourinary, Musculoskeletal, HENT, Cardiovascular, Respiratory, Psychiatric, Allergic/Imm, Heme/Lymph Last edited by Laddie Aquas, COA on 05/15/2021  2:27 PM.     ALLERGIES Allergies  Allergen Reactions   [place]ce Inhibitors Cough   PAST MEDICAL HISTORY Past Medical History:  Diagnosis Date   Anxiety    Cataract    OS   Diabetes mellitus without complication (HCC)    Diabetic retinopathy (HCC)    NPDR OU   Hyperlipidemia    Hypertension    Hypertensive retinopathy    OU   Tremors of nervous system    "just when I get in a nervous Situation".   Past Surgical History:  Procedure Laterality Date   CATARACT EXTRACTION W/PHACO Right 10/18/2018   Procedure: CATARACT EXTRACTION PHACO AND INTRAOCULAR LENS PLACEMENT (IOC);  Surgeon: Fabio Pierce, MD;  Location: [facility];  Service: Ophthalmology;  Laterality: Right;  CDE: 8.11   CATARACT EXTRACTION W/PHACO Left 01/17/2019   Procedure: CATARACT EXTRACTION PHACO AND INTRAOCULAR LENS PLACEMENT (IOC);  Surgeon: Fabio Pierce, MD;  Location: [facility];  Service: Ophthalmology;  Laterality: Left;  CDE: 6.38   CESAREAN SECTION     1988   COLONOSCOPY WITH PROPOFOL N/A 05/04/2020   Procedure: COLONOSCOPY WITH PROPOFOL;  Surgeon: Dolores Frame, MD;  Location: [facility];  Service: Gastroenterology;  Laterality: N/A;  830   EYE SURGERY Right    Cataract extraction OD   POLYPECTOMY  05/04/2020   Procedure: POLYPECTOMY;  Surgeon:  Marguerita Merles, Reuel Boom, MD;  Location: [facility];  Service: Gastroenterology;;   FAMILY HISTORY Family History  Problem Relation Age of Onset   Cancer Mother        breast and colon cancer   Breast cancer Mother    Diabetes Brother    Diabetes Son    Asthma Father        COPD   Diabetes Son    SOCIAL HISTORY Social History   Tobacco Use   Smoking status: Never   Smokeless tobacco: Never  Vaping Use   Vaping Use: Never used  Substance Use Topics   Alcohol use: No   Drug use: No        OPHTHALMIC EXAM:  Base Eye Exam     Visual Acuity (Snellen - Linear)       Right Left   Dist [place] 20/30 -2 20/20   Dist ph  20/25 -2          Tonometry (Tonopen, 2:24 PM)       Right Left   Pressure 15 17         Pupils       Dark Light Shape React APD   Right 2 1 Round Minimal None   Left 2 1 Round Minimal None         Visual Fields (Counting fingers)       Left Right    Full Full         Extraocular Movement       Right Left    Full, Ortho Full, Ortho         Neuro/Psych     Oriented x3: Yes   Mood/Affect: Normal         Dilation     Both eyes: 1.0% Mydriacyl, 2.5% Phenylephrine @ 2:24 PM           Slit Lamp and Fundus Exam     Slit Lamp Exam       Right Left   Lids/Lashes Dermatochalasis - upper lid, Meibomian gland dysfunction Dermatochalasis - upper lid, Meibomian gland dysfunction   Conjunctiva/Sclera White and quiet White and quiet   Cornea Mild Arcus, Well healed temporal cataract wounds, trace Punctate epithelial erosions Mild Arcus, EBMD, trace Punctate epithelial erosions, Well healed temporal cataract wounds, mild Debris in tear film   Anterior Chamber Deep, 1+cell/pigment Deep and quiet   Iris Round and dilated, No NVI, PPM nasally Round and dilated, No NVI   Lens PC IOL in good position PC IOL in good position, trace PCO   Vitreous Vitreous syneresis, mild vitreous condensations, Posterior vitreous detachment, old, white VH settled inferiorly Vitreous syneresis         Fundus Exam       Right Left   Disc mild pallor, Sharp rim mild pallor, Sharp rim   C/D Ratio 0.4 0.4   Macula Flat, good  foveal reflex, interval improvement in central edema, trace exudates and ERM temporal macula Flat, blunted foveal reflex, epiretinal membrane, scattered MA, RPE mottling and clumping, cluster of MA/exudate temporal macula with +central cystic changes   Vessels attenuated, Tortuous Vascular attenuation, Tortuous   Periphery  Attached, scattered MAs, DBH, exudates inferior to disc - resolved, inferior retinal partially obscured by vitreous condensations / old VH attached, scattered DBH greatest nasally and IN, focal exudates nasal midzone           IMAGING AND PROCEDURES  Imaging and Procedures for 12/10/17  OCT, Retina - OU -  Both Eyes       Right Eye Quality was good. Central Foveal Thickness: 311. Progression has improved. Findings include intraretinal hyper-reflective material, no SRF, outer retinal atrophy, abnormal foveal contour, intraretinal fluid (Interval improvement in central IRF and foveal contour, persistent cystic changes temporal macula).   Left Eye Quality was good. Central Foveal Thickness: 396. Progression has been stable. Findings include abnormal foveal contour, intraretinal hyper-reflective material, vitreomacular adhesion , epiretinal membrane, no IRF, no SRF (ERM with interval increase in central cystic changes).   Notes **Images taken, stored on drive**  Diagnosis / Impression:  DME OU (OD>OS) OD: Interval improvement in central IRF and foveal contour, persistent cystic changes temporal macula OS: ERM with interval increase in central cystic changes  Clinical management:  See below  Abbreviations: NFP - Normal foveal profile. CME - cystoid macular edema. PED - pigment epithelial detachment. IRF - intraretinal fluid. SRF - subretinal fluid. EZ - ellipsoid zone. ERM - epiretinal membrane. ORA - outer retinal atrophy. ORT - outer retinal tubulation. SRHM - subretinal hyper-reflective material       Intravitreal Injection, Pharmacologic Agent - OD - Right Eye       Time Out 05/15/2021. 3:08 PM. Confirmed correct patient, procedure, site, and patient consented.   Anesthesia Topical anesthesia was used. Anesthetic medications included Lidocaine 2%, Proparacaine 0.5%.   Procedure Preparation included 5% betadine to ocular surface, eyelid speculum. A (32g) needle was used.    Injection: 2 mg aflibercept 2 MG/0.05ML   Route: Intravitreal, Site: Right Eye   NDC: L6038910, Lot: [id number], Expiration date: 04/09/2022, Waste: 0.05 mL   Post-op Post injection exam found visual acuity of at least counting fingers. The patient tolerated the procedure well. There were no complications. The patient received written and verbal post procedure care education. Post injection medications were not given.            ASSESSMENT/PLAN:    ICD-10-CM   1. 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 Retinal edema  H35.81 OCT, Retina - OU - Both Eyes    3. Posterior vitreous detachment of right eye  H43.811     4. Epiretinal membrane (ERM) of left eye  H35.372     5. Essential hypertension  I10     6. Hypertensive retinopathy of both eyes  H35.033     7. Pseudophakia of both eyes  Z96.1      1,2. Moderate nonproliferative diabetic retinopathy, both eyes  - Diabetic macular edema, both eyes -- relatively mild  - S/P IVA OD #1 (12.17.18), #2 (01.28.19), #3 (02.27.19), #4 (04.03.19), #5 (05.01.19), #6 (05.27.20), #7 (07.08.20)  - in reviewing her case, there was minimal response to IVA in OCT or BCVA  - S/P IVTA OD #1 (05.29.19), #2 (07.24.19), #3 (09.04.19), #4 (12.04.19), #5 (02.26.20), # 6 (08.05.20), # 7(09.30.20), #8 (12.23.20)  - s/p IVE OD #1 (03.03.21), #2 (03.31.21), #3 (04.28.21), #4 (05.28.21), #5 (08.04.21), #6 (09.08.21), #7 (10.06.21), #8 (12.15.21), #9 (02.09.22), #10 (05.11.22), #11 (8.24.22)  - s/p IVE OS #1 (04.28.21), #2 (05.28.21), #3 (12.15.21), #4 (02.09.22)  - S/P focal laser OS (09.02.20)  - s/p focal laser OD (11.17.21)  - OCT shows OD: Interval improvement in central IRF and foveal contour, persistent cystic changes temporal macula; OS: ERM with  interval increase in central  cystic changes  - BCVA 20/25 OD (improved) 20/20 OS  - recommend IVE OD #12 today, 10.05.22  - pt wishes to proceed  - RBA of procedure discussed, questions answered - informed consent obtained and signed - see procedure note  - cont Bromfenac BID OU  - continue PF BID OU  - f/u 6 weeks -- DFE/OCT possible injection(s)  3. PVD / vitreous syneresis OD  - Discussed findings and prognosis  - No RT or RD on 360 scleral depressed exam  - Reviewed s/s of RT/RD  - Strict return precautions for any such RT/RD signs/symptoms   4. Epiretinal membrane, left eye  - mild ERM with VMA/VMT component - BCVA remains 20/20 - still asymptomatic, no metamorphopsia - no indication for surgery at this time - monitor for now  5,6. Hypertensive retinopathy OU  - discussed importance of tight BP control  - monitor  7. Pseudophakia OU  - s/p CE/IOL OU (Dr. June Leap, March/June 2020)  - beautiful surgeries, doing well  - monitor  Ophthalmic Meds Ordered this visit:  Meds ordered this encounter  Medications   aflibercept (EYLEA) SOLN 2 mg       Return in about 6 weeks (around 06/26/2021) for f/u NPDR OU, DFE, OCT.  There are no Patient Instructions on file for this visit.  This document serves as a record of services personally performed by Karie Chimera, MD, PhD. It was created on their behalf by Cristopher Estimable, COT an ophthalmic technician. The creation of this record is the provider's dictation and/or activities during the visit.    Electronically signed by: Cristopher Estimable, COT 10.4.22 @ 5:28 PM   This document serves as a record of services personally performed by Karie Chimera, MD, PhD. It was created on their behalf by Glee Arvin. Manson Passey, OA an ophthalmic technician. The creation of this record is the provider's dictation and/or activities during the visit.    Electronically signed by: Glee Arvin. Manson Passey, [place] 10.05.2022 5:28 PM   Karie Chimera, M.D.,  Ph.D. Diseases & Surgery of the Retina and Vitreous [facility]  I have reviewed the above documentation for accuracy and completeness, and I agree with the above. Karie Chimera, M.D., Ph.D. 05/15/21 5:28 PM  Abbreviations: M myopia (nearsighted); A astigmatism; H hyperopia (farsighted); P presbyopia; Mrx spectacle prescription;  CTL contact lenses; OD right eye; OS left eye; OU both eyes  XT exotropia; ET esotropia; PEK punctate epithelial keratitis; PEE punctate epithelial erosions; DES dry eye syndrome; MGD meibomian gland dysfunction; ATs artificial tears; PFAT's preservative free artificial tears; NSC nuclear sclerotic cataract; PSC posterior subcapsular cataract; ERM epi-retinal membrane; PVD posterior vitreous detachment; RD retinal detachment; DM diabetes mellitus; DR diabetic retinopathy; NPDR non-proliferative diabetic retinopathy; PDR proliferative diabetic retinopathy; CSME clinically significant macular edema; DME diabetic macular edema; dbh dot blot hemorrhages; CWS cotton wool spot; POAG primary open angle glaucoma; C/D cup-to-disc ratio; HVF humphrey visual field; GVF goldmann visual field; OCT optical coherence tomography; IOP intraocular pressure; BRVO Branch retinal vein occlusion; CRVO central retinal vein occlusion; CRAO central retinal artery occlusion; BRAO branch retinal artery occlusion; RT retinal tear; SB scleral buckle; PPV pars plana vitrectomy; VH Vitreous hemorrhage; PRP panretinal laser photocoagulation; IVK intravitreal kenalog; VMT vitreomacular traction; MH Macular hole;  NVD neovascularization of the disc; NVE neovascularization elsewhere; AREDS age related eye disease study; ARMD age related macular degeneration; POAG primary open angle glaucoma; EBMD epithelial/anterior basement membrane dystrophy; ACIOL anterior chamber intraocular lens; IOL  intraocular lens; PCIOL posterior chamber intraocular lens; Phaco/IOL phacoemulsification with intraocular  lens placement; PRK photorefractive keratectomy; LASIK laser assisted in situ keratomileusis; HTN hypertension; DM diabetes mellitus; COPD chronic obstructive pulmonary disease

## 2021-05-15 ENCOUNTER — Other Ambulatory Visit: Payer: Self-pay

## 2021-05-15 ENCOUNTER — Ambulatory Visit (INDEPENDENT_AMBULATORY_CARE_PROVIDER_SITE_OTHER): Payer: PPO | Admitting: Ophthalmology

## 2021-05-15 ENCOUNTER — Encounter (INDEPENDENT_AMBULATORY_CARE_PROVIDER_SITE_OTHER): Payer: Self-pay | Admitting: Ophthalmology

## 2021-05-15 DIAGNOSIS — Z961 Presence of intraocular lens: Secondary | ICD-10-CM | POA: Diagnosis not present

## 2021-05-15 DIAGNOSIS — E113313 Type 2 diabetes mellitus with moderate nonproliferative diabetic retinopathy with macular edema, bilateral: Secondary | ICD-10-CM

## 2021-05-15 DIAGNOSIS — H35033 Hypertensive retinopathy, bilateral: Secondary | ICD-10-CM

## 2021-05-15 DIAGNOSIS — H43811 Vitreous degeneration, right eye: Secondary | ICD-10-CM | POA: Diagnosis not present

## 2021-05-15 DIAGNOSIS — H35372 Puckering of macula, left eye: Secondary | ICD-10-CM | POA: Diagnosis not present

## 2021-05-15 DIAGNOSIS — I10 Essential (primary) hypertension: Secondary | ICD-10-CM | POA: Diagnosis not present

## 2021-05-15 DIAGNOSIS — H3581 Retinal edema: Secondary | ICD-10-CM

## 2021-05-15 MED ORDER — AFLIBERCEPT 2MG/0.05ML IZ SOLN FOR KALEIDOSCOPE
2.0000 mg | INTRAVITREAL | Status: AC | PRN
  Administered 2021-05-15: 2 mg via INTRAVITREAL

## 2021-05-17 ENCOUNTER — Other Ambulatory Visit: Payer: Self-pay | Admitting: Nurse Practitioner

## 2021-05-17 DIAGNOSIS — R251 Tremor, unspecified: Secondary | ICD-10-CM

## 2021-05-17 DIAGNOSIS — R000 Tachycardia, unspecified: Secondary | ICD-10-CM

## 2021-05-23 ENCOUNTER — Other Ambulatory Visit: Payer: Self-pay | Admitting: Nurse Practitioner

## 2021-05-23 DIAGNOSIS — R000 Tachycardia, unspecified: Secondary | ICD-10-CM

## 2021-05-23 DIAGNOSIS — R251 Tremor, unspecified: Secondary | ICD-10-CM

## 2021-05-28 ENCOUNTER — Ambulatory Visit (INDEPENDENT_AMBULATORY_CARE_PROVIDER_SITE_OTHER): Payer: PPO | Admitting: Pharmacist

## 2021-05-28 DIAGNOSIS — I10 Essential (primary) hypertension: Secondary | ICD-10-CM

## 2021-05-28 DIAGNOSIS — E782 Mixed hyperlipidemia: Secondary | ICD-10-CM

## 2021-05-28 DIAGNOSIS — E119 Type 2 diabetes mellitus without complications: Secondary | ICD-10-CM

## 2021-05-28 MED ORDER — EZETIMIBE 10 MG PO TABS: 10.0000 mg | ORAL_TABLET | Freq: Every day | ORAL | 1 refills | Status: DC

## 2021-05-28 NOTE — Chronic Care Management (AMB) (Signed)
Chronic Care Management Pharmacy Note  05/28/2021 Name:  Carolyn Hunter MRN:  [id number] DOB:  July 20, 1951  Summary:  Hyperlipidemia Patient was recently prescribed Repatha but prior authorization required by insurance company. Patient does not currently meet criteria for approval given use for primary prevention and patient is not currently on high intensity statin or ezetimibe.  Discussed increasing to high intensity statin with patient, but given prior issues with rosuvastatin 20 mg, patient prefers to continue current statin since she is tolerating it well.  Continue simvastatin 40 mg by mouth once daily Discussed with atending provider Dr. Posey Pronto. Will add ezetimibe 10 mg by mouth daily. Recheck lipid panel in 2 months at PCP visit. If LDL remains above goal at that time, will consider increase to high intensity statin vs PCSK9i. Will need prior authorization for PCSK9i.   Subjective: Carolyn Hunter is an 70 y.o. year old female who is a primary patient of Noreene Larsson, NP.  The CCM team was consulted for assistance with disease management and care coordination needs.    Engaged with patient by telephone for initial visit in response to provider referral for pharmacy case management and/or care coordination services.   Consent to Services:  The patient was given the following information about Chronic Care Management services today, agreed to services, and gave verbal consent: 1. CCM service includes personalized support from designated clinical staff supervised by the primary care provider, including individualized plan of care and coordination with other care providers 2. 24/7 contact phone numbers for assistance for urgent and routine care needs. 3. Service will only be billed when office clinical staff spend 20 minutes or more in a month to coordinate care. 4. Only one practitioner may furnish and bill the service in a calendar month. 5.The patient may stop CCM services at any time  (effective at the end of the month) by phone call to the office staff. 6. The patient will be responsible for cost sharing (co-pay) of up to 20% of the service fee (after annual deductible is met). Patient agreed to services and consent obtained.  Patient Care Team: Noreene Larsson, NP as PCP - General (Nurse Practitioner) Arnoldo Lenis, MD as PCP - Cardiology (Cardiology) Beryle Lathe, [facility] (Pharmacist)  Objective:  Lab Results  Component Value Date   CREATININE 0.77 03/29/2021   CREATININE 0.78 01/10/2020   CREATININE 0.76 10/13/2018    Lab Results  Component Value Date   HGBA1C 6.9 (H) 03/29/2021   Last diabetic Eye exam: No results found for: HMDIABEYEEXA  Last diabetic Foot exam: No results found for: HMDIABFOOTEX      Component Value Date/Time   CHOL 239 (H) 03/29/2021 0816   TRIG 156 (H) 03/29/2021 0816   HDL 58 03/29/2021 0816   CHOLHDL 3.9 01/10/2020 0803   LDLCALC 153 (H) 03/29/2021 0816   LDLCALC 121 (H) 01/10/2020 0803    Hepatic Function Latest Ref Rng & Units 03/29/2021 01/10/2020 12/28/2017  Total Protein 6.0 - 8.5 g/dL 7.3 7.1 7.3  Albumin 3.8 - 4.8 g/dL 4.5 - -  AST 0 - 40 IU/L 13 16 17   ALT 0 - 32 IU/L 6 9 9   Alk Phosphatase 44 - 121 IU/L 79 - -  Total Bilirubin 0.0 - 1.2 mg/dL 0.5 0.5 0.6  Bilirubin, Direct 0.0 - 0.2 mg/dL - - 0.1    Lab Results  Component Value Date/Time   TSH 1.420 03/29/2021 08:16 AM   TSH 1.36 01/10/2020 08:03 AM  CBC Latest Ref Rng & Units 03/29/2021 01/10/2020 04/17/2017  WBC 3.4 - 10.8 x10E3/uL 6.1 5.9 5.1  Hemoglobin 11.1 - 15.9 g/dL 13.3 12.9 13.8  Hematocrit 34.0 - 46.6 % 39.0 37.8 41.1  Platelets 150 - 450 x10E3/uL 234 210 224    No results found for: VD25OH  Clinical ASCVD: No  The 10-year ASCVD risk score (Arnett DK, et al., 2019) is: 50.5%   Values used to calculate the score:     Age: 34 years     Sex: Female     Is Non-Hispanic African American: Yes     Diabetic: Yes     Tobacco smoker: No      Systolic Blood Pressure: 381 mmHg     Is BP treated: Yes     HDL Cholesterol: 58 mg/dL     Total Cholesterol: 239 mg/dL    Social History   Tobacco Use  Smoking Status Never  Smokeless Tobacco Never   BP Readings from Last 3 Encounters:  05/02/21 (!) 184/82  03/28/21 (!) 172/80  02/20/21 110/80   Pulse Readings from Last 3 Encounters:  05/02/21 (!) 108  03/28/21 (!) 116  07/13/20 (!) 112   Wt Readings from Last 3 Encounters:  05/02/21 147 lb (66.7 kg)  03/28/21 150 lb (68 kg)  02/20/21 150 lb (68 kg)    Assessment: Review of patient past medical history, allergies, medications, health status, including review of consultants reports, laboratory and other test data, was performed as part of comprehensive evaluation and provision of chronic care management services.   SDOH:  (Social Determinants of Health) assessments and interventions performed:    CCM Care Plan  Allergies  Allergen Reactions   Ace Inhibitors Cough   Crestor [Rosuvastatin Calcium] Other (See Comments)    Knee and muscle pain; tolerating simvastatin    Medications Reviewed Today     Reviewed by Beryle Lathe, [facility] (Pharmacist) on 05/28/21 at 1052  Med List Status: <None>   Medication Order Taking? Sig Documenting Provider Last Dose Status Informant  Aflibercept (EYLEA IO) [id number]  Inject 1 Dose into the eye every 30 (thirty) days. [provider]  Active Self  amLODipine (NORVASC) 5 MG tablet [id number] Yes TAKE 1 TABLET BY MOUTH EVERY DAY Noreene Larsson, NP Taking Active   Cholecalciferol (VITAMIN D3) 50 MCG (2000 UT) TABS [id number] Yes Take 2,000 Units by mouth daily. [provider] Taking Active Self  [facility] PAIN RELIEF PATCH EX) [id number] Yes Place 1 patch onto the skin daily as needed (pain.). [provider] Taking Active Self  metFORMIN (GLUCOPHAGE) 1000 MG tablet [id number] Yes TAKE 1 TABLET BY MOUTH EVERY DAY WITH BREAKFAST Noreene Larsson, NP  Taking Active   propranolol ER (INDERAL LA) 60 MG 24 hr capsule [id number] Yes TAKE 1 CAPSULE BY MOUTH EVERY DAY Noreene Larsson, NP Taking Active   simvastatin (ZOCOR) 40 MG tablet [id number] Yes TAKE 1 TABLET BY MOUTH EVERYDAY AT BEDTIME Noreene Larsson, NP Taking Active             Patient Active Problem List   Diagnosis Date Noted   Tachycardia 03/28/2021   Onychomycosis of toenail 03/28/2021   Need for vaccination against Streptococcus pneumoniae using pneumococcal conjugate vaccine 13 01/06/2020   Nonspecific abnormal electrocardiogram (ECG) (EKG) 01/06/2020   Annual visit for general adult medical examination with abnormal findings 01/05/2020   Visit for screening mammogram 01/05/2020   Screening for osteoporosis 01/05/2020   Essential hypertension 01/05/2020  Encounter for screening colonoscopy 01/05/2020   Controlled type 2 diabetes mellitus without complication, without long-term current use of insulin ([place]) 04/17/2017   White coat syndrome with hypertension 04/17/2017   HLD (hyperlipidemia) 04/17/2017   Tremor 04/17/2017    Immunization History  Administered Date(s) Administered   Fluad Quad(high Dose 65+) 05/11/2020   Influenza,inj,Quad PF,6+ Mos 04/17/2017, 04/20/2018   Influenza-Unspecified 03/13/2021   Moderna SARS-COV2 Booster Vaccination 05/14/2021   Moderna Sars-Covid-2 Vaccination 10/06/2019, 11/04/2019, 06/06/2020   Pneumococcal Conjugate-13 04/17/2017, 01/05/2020   Pneumococcal Polysaccharide-23 01/05/2018    Conditions to be addressed/monitored: HTN, HLD, and DMII  Care Plan : Medication Management  Updates made by Beryle Lathe, [place] since 05/28/2021 12:00 AM     Problem: HLD, T2DM, HTN   Priority: High  Onset Date: 05/28/2021     Long-Range Goal: Disease Progression Prevention   Start Date: 05/28/2021  Expected End Date: 08/26/2021  This Visit's Progress: On track  Priority: High  Note:   Current Barriers:  Unable to independently  monitor therapeutic efficacy Unable to achieve control of hyperlipidemia  Pharmacist Clinical Goal(s):  Patient will achieve adherence to monitoring guidelines and medication adherence to achieve therapeutic efficacy achieve control of hyperlipidemia as evidenced by improved LDL through collaboration with PharmD and provider.   Interventions: 1:1 collaboration with Noreene Larsson, NP regarding development and update of comprehensive plan of care as evidenced by provider attestation and co-signature Inter-disciplinary care team collaboration (see longitudinal plan of care) Comprehensive medication review performed; medication list updated in electronic medical record  Type 2 Diabetes - New goal.: Controlled; Most recent A1c at goal of <7% per ADA guidelines Current medications: metformin 1,000 mg by mouth with breakfast Intolerances: none Taking medications as directed: yes Side effects thought to be attributed to current medication regimen: no Denies hypoglycemic symptoms (sweaty and shaky). Hypoglycemia prevention: not indicated at this time Current meal patterns:  patient reports cutting down on sweets and fried foods Current exercise: not discussed today On a statin: yes On aspirin 81 mg daily: no Last microalbumin: 10.8 (03/29/21); on an ACEi/ARB: no, unable to tolerate ACEi (lisinopril due to cough) Last eye exam: completed within last year Last foot exam: completed within last year Pneumonia vaccine: series complete Influenza vaccine: up to date Shingles: unknown Current glucose readings:  not discussed today. Will discuss more at future visits Continue metformin 1,000 mg by mouth with breakfast Continue to monitor A1c and blood glucose  Hypertension - New goal.: Blood pressure under good control. Blood pressure is at goal of <130/80 mmHg per 2017 AHA/ACC guidelines. Blood pressure high in office but blood pressure machine validated in office and home blood pressure within  normal limits Current medications: amlodipine 5 mg by mouth once daily and propranolol 60 mg by mouth once daily Intolerances:  lisinopril (cough) Taking medications as directed: yes Side effects thought to be attributed to current medication regimen: no Denies dizziness and lightheadedness. Current exercise: not discussed today Current home blood pressure: 104-137/69-83 mmHg Continue amlodipine 5 mg by mouth once daily and propranolol 60 mg by mouth once daily Encourage dietary sodium restriction/DASH diet Recommend regular aerobic exercise Recommend home blood pressure monitoring to discuss at next visit  Hyperlipidemia - New goal.: Uncontrolled. LDL above goal of <70 due to very high risk given diabetes + at least 1 additional major risk factor (elevated LDL cholesterol and hypertension) per 2020 AACE/ACE guidelines. Triglycerides above goal of <150 per 2020 AACE/ACE guidelines. Current medications: simvastatin 40 mg by mouth  once daily Patient was recently prescribed Repatha but prior authorization required by insurance company. Patient does not currently meet criteria for approval given use for primary prevention and patient is not currently on high intensity statin or ezetimibe.  Intolerances:  rosuvastatin (knee and muscle pain) Taking medications as directed: yes Side effects thought to be attributed to current medication regimen: no Encourage dietary reduction of high fat containing foods such as butter, nuts, bacon, egg yolks, etc. Recommend regular aerobic exercise Reviewed risks of hyperlipidemia, principles of treatment and consequences of untreated hyperlipidemia Discussed need for medication compliance Discussed increasing to high intensity statin with patient, but given prior issues with rosuvastatin 20 mg, patient prefers to continue current statin since she is tolerating it well.  Continue simvastatin 40 mg by mouth once daily Discussed with atending provider Dr. Posey Pronto.  Will add ezetimibe 10 mg by mouth daily. Recheck lipid panel in 2 months at PCP visit. If LDL remains above goal at that time, will consider increase to high intensity statin vs PCSK9i. Will need prior authorization for PCSK9i.   Patient Goals/Self-Care Activities Patient will:  Take medications as prescribed Check blood sugar once a day at the following times: fasting (at least 8 hours since last food consumption) and whenever patient experiences symptoms of hypo/hyperglycemia, document, and provide at future appointments Check blood pressure at least once daily, document, and provide at future appointments Engage in dietary modifications by less frequent dining out, decreased fat intake, and fewer sweetened foods & beverages  Follow Up Plan: Face to Face appointment with care management team member scheduled for: 08/01/21      Medication Assistance: None required.  Patient affirms current coverage meets needs.  Patient's preferred pharmacy is:  [id number] [place] [place]Phone: [phone]: [phone] Follow Up:  Patient agrees to Care Plan and Follow-up.  Plan: Face to Face appointment with care management team member scheduled for: 08/01/21  CKennon Holter PharmD Clinical Pharmacist [facility] [phone]

## 2021-05-28 NOTE — Patient Instructions (Signed)
Carolyn Hunter,  It was great to talk to you today!  Please call me with any questions or concerns.   Visit Information   PATIENT GOALS:   Goals Addressed             This Visit's Progress    Medication Management       Patient Goals/Self-Care Activities Patient will:  Take medications as prescribed Check blood sugar once a day at the following times: fasting (at least 8 hours since last food consumption) and whenever patient experiences symptoms of hypo/hyperglycemia, document, and provide at future appointments Check blood pressure at least once daily, document, and provide at future appointments Engage in dietary modifications by less frequent dining out, decreased fat intake, and fewer sweetened foods & beverages        Consent to CCM Services: Carolyn Hunter was given information about Chronic Care Management services including:  CCM service includes personalized support from designated clinical staff supervised by her physician, including individualized plan of care and coordination with other care providers 24/7 contact phone numbers for assistance for urgent and routine care needs. Service will only be billed when office clinical staff spend 20 minutes or more in a month to coordinate care. Only one practitioner may furnish and bill the service in a calendar month. The patient may stop CCM services at any time (effective at the end of the month) by phone call to the office staff. The patient will be responsible for cost sharing (co-pay) of up to 20% of the service fee (after annual deductible is met).  Patient agreed to services and verbal consent obtained.   Patient verbalizes understanding of instructions provided today and agrees to view in [place].   Face to Face appointment with care management team member scheduled for: 08/01/21  Carolyn Hunter, PharmD Clinical Pharmacist [facility] [phone]  CLINICAL CARE PLAN: Patient Care Plan:  Medication Management     Problem Identified: HLD, T2DM, HTN   Priority: High  Onset Date: 05/28/2021     Long-Range Goal: Disease Progression Prevention   Start Date: 05/28/2021  Expected End Date: 08/26/2021  This Visit's Progress: On track  Priority: High  Note:   Current Barriers:  Unable to independently monitor therapeutic efficacy Unable to achieve control of hyperlipidemia  Pharmacist Clinical Goal(s):  Patient will achieve adherence to monitoring guidelines and medication adherence to achieve therapeutic efficacy achieve control of hyperlipidemia as evidenced by improved LDL through collaboration with PharmD and provider.   Interventions: 1:1 collaboration with Noreene Larsson, NP regarding development and update of comprehensive plan of care as evidenced by provider attestation and co-signature Inter-disciplinary care team collaboration (see longitudinal plan of care) Comprehensive medication review performed; medication list updated in electronic medical record  Type 2 Diabetes - New goal.: Controlled; Most recent A1c at goal of <7% per ADA guidelines Current medications: metformin 1,000 mg by mouth with breakfast Intolerances: none Taking medications as directed: yes Side effects thought to be attributed to current medication regimen: no Denies hypoglycemic symptoms (sweaty and shaky). Hypoglycemia prevention: not indicated at this time Current meal patterns:  patient reports cutting down on sweets and fried foods Current exercise: not discussed today On a statin: yes On aspirin 81 mg daily: no Last microalbumin: 10.8 (03/29/21); on an ACEi/ARB: no, unable to tolerate ACEi (lisinopril due to cough) Last eye exam: completed within last year Last foot exam: completed within last year Pneumonia vaccine: series complete Influenza vaccine: up to date Shingles: unknown Current glucose  readings:  not discussed today. Will discuss more at future visits Continue  metformin 1,000 mg by mouth with breakfast Continue to monitor A1c and blood glucose  Hypertension - New goal.: Blood pressure under good control. Blood pressure is at goal of <130/80 mmHg per 2017 AHA/ACC guidelines. Blood pressure high in office but blood pressure machine validated in office and home blood pressure within normal limits Current medications: amlodipine 5 mg by mouth once daily and propranolol 60 mg by mouth once daily Intolerances:  lisinopril (cough) Taking medications as directed: yes Side effects thought to be attributed to current medication regimen: no Denies dizziness and lightheadedness. Current exercise: not discussed today Current home blood pressure: 104-137/69-83 mmHg Continue amlodipine 5 mg by mouth once daily and propranolol 60 mg by mouth once daily Encourage dietary sodium restriction/DASH diet Recommend regular aerobic exercise Recommend home blood pressure monitoring to discuss at next visit  Hyperlipidemia - New goal.: Uncontrolled. LDL above goal of <70 due to very high risk given diabetes + at least 1 additional major risk factor (elevated LDL cholesterol and hypertension) per 2020 AACE/ACE guidelines. Triglycerides above goal of <150 per 2020 AACE/ACE guidelines. Current medications: simvastatin 40 mg by mouth once daily Patient was recently prescribed Repatha but prior authorization required by insurance company. Patient does not currently meet criteria for approval given use for primary prevention and patient is not currently on high intensity statin or ezetimibe.  Intolerances:  rosuvastatin (knee and muscle pain) Taking medications as directed: yes Side effects thought to be attributed to current medication regimen: no Encourage dietary reduction of high fat containing foods such as butter, nuts, bacon, egg yolks, etc. Recommend regular aerobic exercise Reviewed risks of hyperlipidemia, principles of treatment and consequences of untreated  hyperlipidemia Discussed need for medication compliance Discussed increasing to high intensity statin with patient, but given prior issues with rosuvastatin 20 mg, patient prefers to continue current statin since she is tolerating it well.  Continue simvastatin 40 mg by mouth once daily Discussed with atending provider Dr. Posey Pronto. Will add ezetimibe 10 mg by mouth daily. Recheck lipid panel in 2 months at PCP visit. If LDL remains above goal at that time, will consider increase to high intensity statin vs PCSK9i. Will need prior authorization for PCSK9i.   Patient Goals/Self-Care Activities Patient will:  Take medications as prescribed Check blood sugar once a day at the following times: fasting (at least 8 hours since last food consumption) and whenever patient experiences symptoms of hypo/hyperglycemia, document, and provide at future appointments Check blood pressure at least once daily, document, and provide at future appointments Engage in dietary modifications by less frequent dining out, decreased fat intake, and fewer sweetened foods & beverages  Follow Up Plan: Face to Face appointment with care management team member scheduled for: 08/01/21

## 2021-06-10 DIAGNOSIS — I10 Essential (primary) hypertension: Secondary | ICD-10-CM | POA: Diagnosis not present

## 2021-06-10 DIAGNOSIS — E782 Mixed hyperlipidemia: Secondary | ICD-10-CM

## 2021-06-10 DIAGNOSIS — E119 Type 2 diabetes mellitus without complications: Secondary | ICD-10-CM | POA: Diagnosis not present

## 2021-06-26 ENCOUNTER — Encounter (INDEPENDENT_AMBULATORY_CARE_PROVIDER_SITE_OTHER): Payer: PPO | Admitting: Ophthalmology

## 2021-07-03 ENCOUNTER — Other Ambulatory Visit: Payer: Self-pay | Admitting: Nurse Practitioner

## 2021-07-03 DIAGNOSIS — E119 Type 2 diabetes mellitus without complications: Secondary | ICD-10-CM

## 2021-07-22 NOTE — Progress Notes (Signed)
[facility] Note  07/24/2021     CHIEF COMPLAINT Patient presents for Retina Follow Up   HISTORY OF PRESENT ILLNESS: Carolyn Hunter is a 70 y.o. female who presents to the clinic today for:   HPI     Retina Follow Up   Patient presents with  Diabetic Retinopathy.  In both eyes.  Severity is moderate.  Duration of 10 weeks.  Since onset it is stable.  I, the attending physician,  performed the HPI with the patient and updated documentation appropriately.        Comments   Pt here for 10 wk ret f/u NPDR OU. *4 wk late for 6 wk f/u* Pt states vision is about the same, no changes. Blood sugar this am was 120. Most recent A1C was around 6.9      Last edited by Bernarda Caffey, MD on 07/25/2021  3:19 PM.     Referring physician: Noreene Larsson, NP [address]  [place] [address] [place],   [address]  HISTORICAL INFORzMATION:   Selected notes from the MEDICAL RECORD NUMBER Referral from Dr. Jeb Levering for DM exam   CURRENT MEDICATIONS: Current Outpatient Medications (Ophthalmic Drugs)  Medication Sig   Aflibercept (EYLEA IO) Inject 1 Dose into the eye every 30 (thirty) days.   No current facility-administered medications for this visit. (Ophthalmic Drugs)   Current Outpatient Medications (Other)  Medication Sig   amLODipine (NORVASC) 5 MG tablet TAKE 1 TABLET BY MOUTH EVERY DAY   Cholecalciferol (VITAMIN D3) 50 MCG (2000 UT) TABS Take 2,000 Units by mouth daily.   ezetimibe (ZETIA) 10 MG tablet Take 1 tablet (10 mg total) by mouth daily.   Liniments (SALONPAS PAIN RELIEF PATCH EX) Place 1 patch onto the skin daily as needed (pain.).   metFORMIN (GLUCOPHAGE) 1000 MG tablet TAKE 1 TABLET BY MOUTH EVERY DAY WITH BREAKFAST   propranolol ER (INDERAL LA) 60 MG 24 hr capsule TAKE 1 CAPSULE BY MOUTH EVERY DAY   simvastatin (ZOCOR) 40 MG tablet TAKE 1 TABLET BY MOUTH EVERYDAY AT BEDTIME   No current facility-administered medications for this visit.  (Other)   REVIEW OF SYSTEMS: ROS   Positive for: Neurological, Endocrine, Eyes Negative for: Constitutional, Gastrointestinal, Skin, Genitourinary, Musculoskeletal, HENT, Cardiovascular, Respiratory, Psychiatric, Allergic/Imm, Heme/Lymph Last edited by Kingsley Spittle, COT on 07/24/2021  2:33 PM.     ALLERGIES Allergies  Allergen Reactions   Ace Inhibitors Cough   Crestor [Rosuvastatin Calcium] Other (See Comments)    Knee and muscle pain; tolerating simvastatin   PAST MEDICAL HISTORY Past Medical History:  Diagnosis Date   Anxiety    Cataract    OS   Diabetes mellitus without complication ([place])    Diabetic retinopathy ([place])    NPDR OU   Hyperlipidemia    Hypertension    Hypertensive retinopathy    OU   Tremors of nervous system    "just when I get in a nervous Situation".   Past Surgical History:  Procedure Laterality Date   CATARACT EXTRACTION W/PHACO Right 10/18/2018   Procedure: CATARACT EXTRACTION PHACO AND INTRAOCULAR LENS PLACEMENT (IOC);  Surgeon: Baruch Goldmann, MD;  Location: [facility];  Service: Ophthalmology;  Laterality: Right;  CDE: 8.11   CATARACT EXTRACTION W/PHACO Left 01/17/2019   Procedure: CATARACT EXTRACTION PHACO AND INTRAOCULAR LENS PLACEMENT (IOC);  Surgeon: Baruch Goldmann, MD;  Location: [facility];  Service: Ophthalmology;  Laterality: Left;  CDE: 6.38   CESAREAN SECTION  1988   COLONOSCOPY WITH PROPOFOL N/A 05/04/2020   Procedure: COLONOSCOPY WITH PROPOFOL;  Surgeon: Harvel Quale, MD;  Location: [facility];  Service: Gastroenterology;  Laterality: N/A;  830   EYE SURGERY Right    Cataract extraction OD   POLYPECTOMY  05/04/2020   Procedure: POLYPECTOMY;  Surgeon: Montez Morita, Quillian Quince, MD;  Location: [facility];  Service: Gastroenterology;;   FAMILY HISTORY Family History  Problem Relation Age of Onset   Cancer Mother        breast and colon cancer   Breast cancer Mother    Diabetes Brother    Diabetes Son    Asthma  Father        COPD   Diabetes Son    SOCIAL HISTORY Social History   Tobacco Use   Smoking status: Never   Smokeless tobacco: Never  Vaping Use   Vaping Use: Never used  Substance Use Topics   Alcohol use: No   Drug use: No       OPHTHALMIC EXAM:  Base Eye Exam     Visual Acuity (Snellen - Linear)       Right Left   Dist [place] 20/25 -1 20/20   Dist ph [place] 20/20 -2          Tonometry (Tonopen, 2:39 PM)       Right Left   Pressure 14 15         Pupils       Dark Light Shape React APD   Right 3 2 Round Minimal None   Left 3 2 Round Minimal None         Visual Fields (Counting fingers)       Left Right    Full Full         Extraocular Movement       Right Left    Full, Ortho Full, Ortho         Neuro/Psych     Oriented x3: Yes   Mood/Affect: Normal         Dilation     Both eyes: 1.0% Mydriacyl, 2.5% Phenylephrine @ 2:40 PM           Slit Lamp and Fundus Exam     Slit Lamp Exam       Right Left   Lids/Lashes Dermatochalasis - upper lid, Meibomian gland dysfunction Dermatochalasis - upper lid, Meibomian gland dysfunction   Conjunctiva/Sclera White and quiet White and quiet   Cornea Mild Arcus, Well healed temporal cataract wounds, trace Punctate epithelial erosions Mild Arcus, EBMD, trace Punctate epithelial erosions, Well healed temporal cataract wounds, mild Debris in tear film   Anterior Chamber Deep, 1+cell/pigment Deep and quiet   Iris Round and dilated, No NVI, PPM nasally Round and dilated, No NVI   Lens PC IOL in good position PC IOL in good position, trace PCO   Anterior Vitreous Vitreous syneresis, mild vitreous condensations, Posterior vitreous detachment, old, white VH settled inferiorly Vitreous syneresis         Fundus Exam       Right Left   Disc mild pallor, Sharp rim mild pallor, Sharp rim   C/D Ratio 0.4 0.4   Macula Flat, good foveal reflex, interval improvement in central edema, trace exudates and ERM  temporal macula, no heme Flat, blunted foveal reflex, epiretinal membrane, scattered MA, RPE mottling and clumping, cluster of MA/exudate temporal macula with +central cystic changes   Vessels attenuated, Tortuous Vascular attenuation, Tortuous  Periphery Attached, scattered MAs, DBH, exudates inferior to disc - resolved, inferior retinal partially obscured by vitreous condensations / old VH attached, scattered DBH greatest nasally and IN, focal exudates nasal midzone - improved           IMAGING AND PROCEDURES  Imaging and Procedures for 12/10/17  OCT, Retina - OU - Both Eyes       Right Eye Quality was good. Central Foveal Thickness: 308. Progression has improved. Findings include intraretinal hyper-reflective material, no SRF, outer retinal atrophy, abnormal foveal contour, intraretinal fluid (Interval improvement in IRF and IRHM, just cystic changes remain).   Left Eye Quality was good. Central Foveal Thickness: 376. Progression has improved. Findings include abnormal foveal contour, intraretinal hyper-reflective material, vitreomacular adhesion , epiretinal membrane, no IRF, no SRF, macular pucker (ERM with interval improvement in central cystic changes, mild IRHM temporal mac).   Notes **Images taken, stored on drive**  Diagnosis / Impression:  DME OU (OD>OS) OD: Interval improvement in IRF and IRHM, just cystic changes remain OS: ERM with interval improvement in central cystic changes, mild IRHM temporal mac  Clinical management:  See below  Abbreviations: NFP - Normal foveal profile. CME - cystoid macular edema. PED - pigment epithelial detachment. IRF - intraretinal fluid. SRF - subretinal fluid. EZ - ellipsoid zone. ERM - epiretinal membrane. ORA - outer retinal atrophy. ORT - outer retinal tubulation. SRHM - subretinal hyper-reflective material       Intravitreal Injection, Pharmacologic Agent - OD - Right Eye       Time Out 07/24/2021. 2:56 PM. Confirmed correct  patient, procedure, site, and patient consented.   Anesthesia Topical anesthesia was used. Anesthetic medications included Proparacaine 0.5%, Lidocaine 2%.   Procedure Preparation included 5% betadine to ocular surface, eyelid speculum. A (32g) needle was used.   Injection: 2 mg aflibercept 2 MG/0.05ML   Route: Intravitreal, Site: Right Eye   NDC: [id number], Lot: [id number], Expiration date: 06/10/2022, Waste: 0.05 mL   Post-op Post injection exam found visual acuity of at least counting fingers. The patient tolerated the procedure well. There were no complications. The patient received written and verbal post procedure care education.            ASSESSMENT/PLAN:   ICD-10-CM   1. 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 Posterior vitreous detachment of right eye  H43.811     3. Epiretinal membrane (ERM) of left eye  H35.372     4. Essential hypertension  I10     5. Hypertensive retinopathy of both eyes  H35.033     6. Pseudophakia of both eyes  Z96.1       1. Moderate nonproliferative diabetic retinopathy, both eyes  - pt is delayed to follow up from 4 weeks to 10 weeks  - Diabetic macular edema, both eyes -- relatively mild  - S/P IVA OD #1 (12.17.18), #2 (01.28.19), #3 (02.27.19), #4 (04.03.19), #5 (05.01.19), #6 (05.27.20), #7 (07.08.20)  - in reviewing her case, there was minimal response to IVA in OCT or BCVA  - S/P IVTA OD #1 (05.29.19), #2 (07.24.19), #3 (09.04.19), #4 (12.04.19), #5 (02.26.20), # 6 (08.05.20), # 7(09.30.20), #8 (12.23.20)  - s/p IVE OD #1 (03.03.21), #2 (03.31.21), #3 (04.28.21), #4 (05.28.21), #5 (08.04.21), #  6 (09.08.21), #7 (10.06.21), #8 (12.15.21), #9 (02.09.22), #10 (05.11.22), #11 (8.24.22), #12 (10.05.22)  - s/p  IVE OS #1 (04.28.21), #2 (05.28.21), #3 (12.15.21), #4 (02.09.22)  - S/P focal laser OS (09.02.20)  - s/p focal laser OD (11.17.21)  - OCT shows OD: Interval improvement in IRF and IRHM, just cystic changes remain; OS: ERM with interval improvement in central cystic changes, mild IRHM temporal mac at 10 weeks  - BCVA 20/20 OD (improved) 20/20 OS  - recommend IVE OD #13 today, 12.14.22  - pt wishes to proceed  - RBA of procedure discussed, questions answered - informed consent obtained and signed - see procedure note  - cont Bromfenac BID OU  - continue PF BID OU  - f/u 10 weeks -- DFE/OCT possible injection(s)  2. PVD / vitreous syneresis OD  - Discussed findings and prognosis  - No RT or RD on 360 scleral depressed exam  - Reviewed s/s of RT/RD  - Strict return precautions for any such RT/RD signs/symptoms   3. Epiretinal membrane, left eye  - mild ERM with VMA/VMT component - BCVA remains 20/20 - still asymptomatic, no metamorphopsia - no indication for surgery at this time - monitor for now  4,5. Hypertensive retinopathy OU  - discussed importance of tight BP control  - monitor  6. Pseudophakia OU  - s/p CE/IOL OU (Dr. Marisa Hua, [place])  - beautiful surgeries, doing well  - monitor  Ophthalmic Meds Ordered this visit:  Meds ordered this encounter  Medications   aflibercept (EYLEA) SOLN 2 mg     Return in about 10 weeks (around 10/02/2021) for f/u NPDR OU, DFE, OCT.  There are no Patient Instructions on file for this visit.  This document serves as a record of services personally performed by Gardiner Sleeper, MD, PhD. It was created on their behalf by Orvan Falconer, an ophthalmic technician. The creation of this record is the provider's dictation and/or activities during the visit.    Electronically signed by: Orvan Falconer, OA, 07/25/21  3:25 PM  This document serves as a record of services personally performed by Gardiner Sleeper, MD, PhD. It was  created on their behalf by San Jetty. Owens Shark, OA an ophthalmic technician. The creation of this record is the provider's dictation and/or activities during the visit.    Electronically signed by: San Jetty. Owens Shark, [place] 12.14.2022 3:25 PM   Gardiner Sleeper, M.D., Ph.D. Diseases & Surgery of the Retina and Vitreous [facility]  I have reviewed the above documentation for accuracy and completeness, and I agree with the above. Gardiner Sleeper, M.D., Ph.D. 07/25/21 3:25 PM   Abbreviations: M myopia (nearsighted); A astigmatism; H hyperopia (farsighted); P presbyopia; Mrx spectacle prescription;  CTL contact lenses; OD right eye; OS left eye; OU both eyes  XT exotropia; ET esotropia; PEK punctate epithelial keratitis; PEE punctate epithelial erosions; DES dry eye syndrome; MGD meibomian gland dysfunction; ATs artificial tears; PFAT's preservative free artificial tears; [place] nuclear sclerotic cataract; PSC posterior subcapsular cataract; ERM epi-retinal membrane; PVD posterior vitreous detachment; RD retinal detachment; DM diabetes mellitus; DR diabetic retinopathy; NPDR non-proliferative diabetic retinopathy; PDR proliferative diabetic retinopathy; CSME clinically significant macular edema; DME diabetic macular edema; dbh dot blot hemorrhages; CWS cotton wool spot; POAG primary open angle glaucoma; C/D cup-to-disc ratio; HVF humphrey visual field; GVF goldmann visual field; OCT optical coherence tomography; IOP intraocular pressure; BRVO Branch retinal vein occlusion; CRVO central retinal vein occlusion; CRAO central retinal artery  occlusion; BRAO branch retinal artery occlusion; RT retinal tear; SB scleral buckle; PPV pars plana vitrectomy; VH Vitreous hemorrhage; PRP panretinal laser photocoagulation; IVK intravitreal kenalog; VMT vitreomacular traction; MH Macular hole;  NVD neovascularization of the disc; NVE neovascularization elsewhere; AREDS age related eye disease study; ARMD age  related macular degeneration; POAG primary open angle glaucoma; EBMD epithelial/anterior basement membrane dystrophy; ACIOL anterior chamber intraocular lens; IOL intraocular lens; PCIOL posterior chamber intraocular lens; Phaco/IOL phacoemulsification with intraocular lens placement; [place] photorefractive keratectomy; LASIK laser assisted in situ keratomileusis; HTN hypertension; DM diabetes mellitus; COPD chronic obstructive pulmonary disease

## 2021-07-24 ENCOUNTER — Ambulatory Visit (INDEPENDENT_AMBULATORY_CARE_PROVIDER_SITE_OTHER): Payer: PPO | Admitting: Ophthalmology

## 2021-07-24 ENCOUNTER — Encounter (INDEPENDENT_AMBULATORY_CARE_PROVIDER_SITE_OTHER): Payer: Self-pay | Admitting: Ophthalmology

## 2021-07-24 ENCOUNTER — Other Ambulatory Visit: Payer: Self-pay

## 2021-07-24 DIAGNOSIS — I10 Essential (primary) hypertension: Secondary | ICD-10-CM

## 2021-07-24 DIAGNOSIS — H35372 Puckering of macula, left eye: Secondary | ICD-10-CM

## 2021-07-24 DIAGNOSIS — H43811 Vitreous degeneration, right eye: Secondary | ICD-10-CM

## 2021-07-24 DIAGNOSIS — H25813 Combined forms of age-related cataract, bilateral: Secondary | ICD-10-CM

## 2021-07-24 DIAGNOSIS — E113313 Type 2 diabetes mellitus with moderate nonproliferative diabetic retinopathy with macular edema, bilateral: Secondary | ICD-10-CM | POA: Diagnosis not present

## 2021-07-24 DIAGNOSIS — H35033 Hypertensive retinopathy, bilateral: Secondary | ICD-10-CM

## 2021-07-24 DIAGNOSIS — H25812 Combined forms of age-related cataract, left eye: Secondary | ICD-10-CM

## 2021-07-24 DIAGNOSIS — H3581 Retinal edema: Secondary | ICD-10-CM

## 2021-07-24 DIAGNOSIS — Z961 Presence of intraocular lens: Secondary | ICD-10-CM

## 2021-07-25 ENCOUNTER — Encounter (INDEPENDENT_AMBULATORY_CARE_PROVIDER_SITE_OTHER): Payer: Self-pay | Admitting: Ophthalmology

## 2021-07-25 DIAGNOSIS — Z961 Presence of intraocular lens: Secondary | ICD-10-CM | POA: Diagnosis not present

## 2021-07-25 DIAGNOSIS — H43811 Vitreous degeneration, right eye: Secondary | ICD-10-CM | POA: Diagnosis not present

## 2021-07-25 DIAGNOSIS — I10 Essential (primary) hypertension: Secondary | ICD-10-CM | POA: Diagnosis not present

## 2021-07-25 DIAGNOSIS — H35033 Hypertensive retinopathy, bilateral: Secondary | ICD-10-CM | POA: Diagnosis not present

## 2021-07-25 DIAGNOSIS — E113313 Type 2 diabetes mellitus with moderate nonproliferative diabetic retinopathy with macular edema, bilateral: Secondary | ICD-10-CM | POA: Diagnosis not present

## 2021-07-25 DIAGNOSIS — H35372 Puckering of macula, left eye: Secondary | ICD-10-CM | POA: Diagnosis not present

## 2021-07-25 MED ORDER — AFLIBERCEPT 2MG/0.05ML IZ SOLN FOR KALEIDOSCOPE
2.0000 mg | INTRAVITREAL | Status: AC | PRN
  Administered 2021-07-25: 2 mg via INTRAVITREAL

## 2021-07-30 DIAGNOSIS — E782 Mixed hyperlipidemia: Secondary | ICD-10-CM | POA: Diagnosis not present

## 2021-07-30 DIAGNOSIS — I10 Essential (primary) hypertension: Secondary | ICD-10-CM | POA: Diagnosis not present

## 2021-07-30 DIAGNOSIS — E119 Type 2 diabetes mellitus without complications: Secondary | ICD-10-CM | POA: Diagnosis not present

## 2021-07-31 LAB — CBC WITH DIFFERENTIAL/PLATELET
Basophils Absolute: 0 10*3/uL (ref 0.0–0.2)
Basos: 1 %
EOS (ABSOLUTE): 0.3 10*3/uL (ref 0.0–0.4)
Eos: 5 %
Hematocrit: 37.3 % (ref 34.0–46.6)
Hemoglobin: 13.1 g/dL (ref 11.1–15.9)
Immature Grans (Abs): 0 10*3/uL (ref 0.0–0.1)
Immature Granulocytes: 0 %
Lymphocytes Absolute: 1.8 10*3/uL (ref 0.7–3.1)
Lymphs: 34 %
MCH: 31.6 pg (ref 26.6–33.0)
MCHC: 35.1 g/dL (ref 31.5–35.7)
MCV: 90 fL (ref 79–97)
Monocytes Absolute: 0.6 10*3/uL (ref 0.1–0.9)
Monocytes: 12 %
Neutrophils Absolute: 2.6 10*3/uL (ref 1.4–7.0)
Neutrophils: 48 %
Platelets: 218 10*3/uL (ref 150–450)
RBC: 4.15 x10E6/uL (ref 3.77–5.28)
RDW: 11.2 % — ABNORMAL LOW (ref 11.7–15.4)
WBC: 5.4 10*3/uL (ref 3.4–10.8)

## 2021-07-31 LAB — CMP14+EGFR
ALT: 7 IU/L (ref 0–32)
AST: 15 IU/L (ref 0–40)
Albumin/Globulin Ratio: 1.9 (ref 1.2–2.2)
Albumin: 4.4 g/dL (ref 3.8–4.8)
Alkaline Phosphatase: 79 IU/L (ref 44–121)
BUN/Creatinine Ratio: 12 (ref 12–28)
BUN: 10 mg/dL (ref 8–27)
Bilirubin Total: 0.6 mg/dL (ref 0.0–1.2)
CO2: 27 mmol/L (ref 20–29)
Calcium: 10 mg/dL (ref 8.7–10.3)
Chloride: 102 mmol/L (ref 96–106)
Creatinine, Ser: 0.85 mg/dL (ref 0.57–1.00)
Globulin, Total: 2.3 g/dL (ref 1.5–4.5)
Glucose: 138 mg/dL — ABNORMAL HIGH (ref 70–99)
Potassium: 4.1 mmol/L (ref 3.5–5.2)
Sodium: 141 mmol/L (ref 134–144)
Total Protein: 6.7 g/dL (ref 6.0–8.5)
eGFR: 75 mL/min/{1.73_m2} (ref >59)

## 2021-07-31 LAB — LIPID PANEL WITH LDL/HDL RATIO
Cholesterol, Total: 207 mg/dL — ABNORMAL HIGH (ref 100–199)
HDL: 57 mg/dL (ref >39)
LDL Chol Calc (NIH): 129 mg/dL — ABNORMAL HIGH (ref 0–99)
LDL/HDL Ratio: 2.3 ratio (ref 0.0–3.2)
Triglycerides: 120 mg/dL (ref 0–149)
VLDL Cholesterol Cal: 21 mg/dL (ref 5–40)

## 2021-07-31 LAB — HEMOGLOBIN A1C
Est. average glucose Bld gHb Est-mCnc: 146 mg/dL
Hgb A1c MFr Bld: 6.7 % — ABNORMAL HIGH (ref 4.8–5.6)

## 2021-07-31 NOTE — Progress Notes (Signed)
Choelsterol is still elevated, but looks better than at the last lab draw. We will discuss this at her appt tomorrow.

## 2021-08-01 ENCOUNTER — Ambulatory Visit (INDEPENDENT_AMBULATORY_CARE_PROVIDER_SITE_OTHER): Payer: PPO | Admitting: Nurse Practitioner

## 2021-08-01 ENCOUNTER — Ambulatory Visit (INDEPENDENT_AMBULATORY_CARE_PROVIDER_SITE_OTHER): Payer: PPO | Admitting: Pharmacist

## 2021-08-01 ENCOUNTER — Other Ambulatory Visit: Payer: Self-pay

## 2021-08-01 ENCOUNTER — Encounter: Payer: Self-pay | Admitting: Nurse Practitioner

## 2021-08-01 VITALS — BP 178/84 | HR 102 | Ht 63.0 in | Wt 143.1 lb

## 2021-08-01 DIAGNOSIS — I10 Essential (primary) hypertension: Secondary | ICD-10-CM

## 2021-08-01 DIAGNOSIS — E782 Mixed hyperlipidemia: Secondary | ICD-10-CM

## 2021-08-01 DIAGNOSIS — E119 Type 2 diabetes mellitus without complications: Secondary | ICD-10-CM | POA: Diagnosis not present

## 2021-08-01 DIAGNOSIS — R000 Tachycardia, unspecified: Secondary | ICD-10-CM

## 2021-08-01 DIAGNOSIS — R251 Tremor, unspecified: Secondary | ICD-10-CM

## 2021-08-01 MED ORDER — REPATHA SURECLICK 140 MG/ML ~~LOC~~ SOAJ
140.0000 mg | SUBCUTANEOUS | 3 refills | Status: DC
Start: 2021-08-01 — End: 2021-08-29

## 2021-08-01 NOTE — Assessment & Plan Note (Signed)
-  she was unable to tolerate zetia -still taking max dose simvastatin -recommend praluent since her LDL is still elevated Lab Results  Component Value Date   CHOL 207 (H) 07/30/2021   HDL 57 07/30/2021   LDLCALC 129 (H) 07/30/2021   TRIG 120 07/30/2021   CHOLHDL 3.9 01/10/2020  -she will work with pharmacy; if praluent isn't an option, would consider nexletol, but not nexlizet d/t zetia intolerance

## 2021-08-01 NOTE — Progress Notes (Signed)
Acute Office Visit  Subjective:    Patient ID: Carolyn Hunter, female    DOB: 1951-02-21, 70 y.o.   MRN: [id number]  Chief Complaint  Patient presents with   Follow-up    HPI Patient is in today for follow-up for HTN and HLD.  She has documented white coat syndrome.  For HLD, she states that she couldn't tolerate zetia.  Past Medical History:  Diagnosis Date   Anxiety    Cataract    OS   Diabetes mellitus without complication (HCC)    Diabetic retinopathy ([place])    NPDR OU   Hyperlipidemia    Hypertension    Hypertensive retinopathy    OU   Tremors of nervous system    "just when I get in a nervous Situation".    Past Surgical History:  Procedure Laterality Date   CATARACT EXTRACTION W/PHACO Right 10/18/2018   Procedure: CATARACT EXTRACTION PHACO AND INTRAOCULAR LENS PLACEMENT (IOC);  Surgeon: Baruch Goldmann, MD;  Location: [facility];  Service: Ophthalmology;  Laterality: Right;  CDE: 8.11   CATARACT EXTRACTION W/PHACO Left 01/17/2019   Procedure: CATARACT EXTRACTION PHACO AND INTRAOCULAR LENS PLACEMENT (IOC);  Surgeon: Baruch Goldmann, MD;  Location: [facility];  Service: Ophthalmology;  Laterality: Left;  CDE: 6.38   CESAREAN SECTION     1988   COLONOSCOPY WITH PROPOFOL N/A 05/04/2020   Procedure: COLONOSCOPY WITH PROPOFOL;  Surgeon: Harvel Quale, MD;  [facility];  Service: Gastroenterology;  Laterality: N/A;  830   EYE SURGERY Right    Cataract extraction OD   POLYPECTOMY  05/04/2020   Procedure: POLYPECTOMY;  Surgeon: Montez Morita, Quillian Quince, MD;  [facility];  Service: Gastroenterology;;    Family History  Problem Relation Age of Onset   Cancer Mother        breast and colon cancer   Breast cancer Mother    Diabetes Brother    Diabetes Son    Asthma Father        COPD   Diabetes Son     Social History   Socioeconomic History   Marital status: Married    Spouse name: Donnie    Number of children: 4   Years of  education: Not on file   Highest education level: Associate degree: occupational, Hotel manager, or vocational program  Occupational History   Occupation: retired  Tobacco Use   Smoking status: Never   Smokeless tobacco: Never  Scientific laboratory technician Use: Never used  Substance and Sexual Activity   Alcohol use: No   Drug use: No   Sexual activity: Yes    Birth control/protection: Post-menopausal    Comment: 70 years old  Other Topics Concern   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       Eats all food groups. Exercises 30 min a day, four days a week.    Enjoys activities. Watch television. grandkids       Social Determinants of Radio broadcast assistant Strain: Low Risk    Difficulty of Paying Living Expenses: Not hard at all  Food Insecurity: No Food Insecurity   Worried About Charity fundraiser in the Last Year: Never true   Arboriculturist in the Last Year: Never true  Transportation Needs: No Transportation Needs   Lack of Transportation (Medical): No   Lack of Transportation (Non-Medical): No  Physical Activity: Insufficiently Active  Days of Exercise per Week: 3 days   Minutes of Exercise per Session: 30 min  Stress: No Stress Concern Present   Feeling of Stress : Not at all  Social Connections: Moderately Integrated   Frequency of Communication with Friends and Family: More than three times a week   Frequency of Social Gatherings with Friends and Family: More than three times a week   Attends Religious Services: 1 to 4 times per year   Active Member of [employer] or Organizations: No   Attends Archivist Meetings: Never   Marital Status: Married  Human resources officer Violence: Not At Risk   Fear of Current or Ex-Partner: No   Emotionally Abused: No   Physically Abused: No   Sexually Abused: No    Outpatient Medications Prior to Visit  Medication Sig Dispense Refill   Aflibercept (EYLEA IO) Inject 1  Dose into the eye every 30 (thirty) days.     amLODipine (NORVASC) 5 MG tablet TAKE 1 TABLET BY MOUTH EVERY DAY 90 tablet 0   Cholecalciferol (VITAMIN D3) 50 MCG (2000 UT) TABS Take 2,000 Units by mouth daily.     Liniments (SALONPAS PAIN RELIEF PATCH EX) Place 1 patch onto the skin daily as needed (pain.).     metFORMIN (GLUCOPHAGE) 1000 MG tablet TAKE 1 TABLET BY MOUTH EVERY DAY WITH BREAKFAST 90 tablet 1   propranolol ER (INDERAL LA) 60 MG 24 hr capsule TAKE 1 CAPSULE BY MOUTH EVERY DAY 30 capsule 1   simvastatin (ZOCOR) 40 MG tablet TAKE 1 TABLET BY MOUTH EVERYDAY AT BEDTIME 90 tablet 1   ezetimibe (ZETIA) 10 MG tablet Take 1 tablet (10 mg total) by mouth daily. 90 tablet 1   No facility-administered medications prior to visit.    Allergies  Allergen Reactions   Ace Inhibitors Cough   Crestor [Rosuvastatin Calcium] Other (See Comments)    Knee and muscle pain; tolerating simvastatin   Zetia [Ezetimibe] Swelling    Review of Systems  Constitutional: Negative.   Respiratory: Negative.    Cardiovascular: Negative.   Musculoskeletal: Negative.   Psychiatric/Behavioral: Negative.        Objective:    Physical Exam Constitutional:      Appearance: Normal appearance.  Cardiovascular:     Rate and Rhythm: Normal rate and regular rhythm.     Pulses: Normal pulses.     Heart sounds: Normal heart sounds.  Pulmonary:     Effort: Pulmonary effort is normal.     Breath sounds: Normal breath sounds.  Musculoskeletal:        General: Normal range of motion.  Neurological:     Mental Status: She is alert.  Psychiatric:        Mood and Affect: Mood normal.        Behavior: Behavior normal.        Thought Content: Thought content normal.        Judgment: Judgment normal.    BP (!) 178/84    Pulse (!) 102    Ht _0  (1.6 m)    Wt 143 lb 1.3 oz (64.9 kg)    SpO2 97%    BMI 25.35 kg/m  Wt Readings from Last 3 Encounters:  08/01/21 143 lb 1.3 oz (64.9 kg)  05/02/21 147 lb (66.7  kg)  03/28/21 150 lb (68 kg)    Health Maintenance Due  Topic Date Due   COVID-19 Vaccine (4 - Booster for Moderna series) 07/09/2021    There are no  preventive care reminders to display for this patient.   Lab Results  Component Value Date   TSH 1.420 03/29/2021   Lab Results  Component Value Date   WBC 5.4 07/30/2021   HGB 13.1 07/30/2021   HCT 37.3 07/30/2021   MCV 90 07/30/2021   PLT 218 07/30/2021   Lab Results  Component Value Date   NA 141 07/30/2021   K 4.1 07/30/2021   CO2 27 07/30/2021   GLUCOSE 138 (H) 07/30/2021   BUN 10 07/30/2021   CREATININE 0.85 07/30/2021   BILITOT 0.6 07/30/2021   ALKPHOS 79 07/30/2021   AST 15 07/30/2021   ALT 7 07/30/2021   PROT 6.7 07/30/2021   ALBUMIN 4.4 07/30/2021   CALCIUM 10.0 07/30/2021   ANIONGAP 7 10/13/2018   EGFR 75 07/30/2021   Lab Results  Component Value Date   CHOL 207 (H) 07/30/2021   Lab Results  Component Value Date   HDL 57 07/30/2021   Lab Results  Component Value Date   LDLCALC 129 (H) 07/30/2021   Lab Results  Component Value Date   TRIG 120 07/30/2021   Lab Results  Component Value Date   CHOLHDL 3.9 01/10/2020   Lab Results  Component Value Date   HGBA1C 6.7 (H) 07/30/2021       Assessment & Plan:   Problem List Items Addressed This Visit       Cardiovascular and Mediastinum   White coat syndrome with hypertension    BP Readings from Last 3 Encounters:  08/01/21 (!) 178/84  05/02/21 (!) 184/82  03/28/21 (!) 172/80  -home BP this AM was 128/75 -no med changes today since home BP has been good        Endocrine   Controlled type 2 diabetes mellitus without complication, without long-term current use of insulin (HCC) - Primary    Lab Results  Component Value Date   HGBA1C 6.7 (H) 07/30/2021  -well controlled -continue current meds      Relevant Orders   CMP14+EGFR   CBC with Differential/Platelet   Lipid Panel With LDL/HDL Ratio   Hemoglobin A1c     Other   HLD  (hyperlipidemia)    -she was unable to tolerate zetia -still taking max dose simvastatin -recommend praluent since her LDL is still elevated Lab Results  Component Value Date   CHOL 207 (H) 07/30/2021   HDL 57 07/30/2021   LDLCALC 129 (H) 07/30/2021   TRIG 120 07/30/2021   CHOLHDL 3.9 01/10/2020  -she will work with pharmacy; if praluent isn't an option, would consider nexletol, but not nexlizet d/t zetia intolerance       Relevant Orders   CBC with Differential/Platelet   Lipid Panel With LDL/HDL Ratio     No orders of the defined types were placed in this encounter.    Noreene Larsson, NP

## 2021-08-01 NOTE — Patient Instructions (Signed)
Please see our pharmacist before you leave today.  Please have fasting labs drawn 2-3 days prior to your appointment so we can discuss the results during your office visit.  I will be moving to [facility] located at [address], [place], [place] [address] effective Aug 11, 2021. If you would like to establish care with [facility] please call [phone].

## 2021-08-01 NOTE — Chronic Care Management (AMB) (Signed)
Chronic Care Management Pharmacy Note  08/01/2021 Name:  Carolyn Hunter MRN:  [id number] DOB:  08-16-1950  Summary: Hyperlipidemia Uncontrolled but improving. LDL 153>129. LDL above goal of <70 due to very high risk given diabetes + at least 1 additional major risk factor (elevated LDL cholesterol and hypertension) per 2020 AACE/ACE guidelines. Triglycerides above goal of <150 per 2020 AACE/ACE guidelines. Patient reports a sore throat with ezetimibe so she quit taking it Patient was shown the proper administration technique for Repatha autoinjector today in clinic Discussed options with PCP. Patient's insurance does not cover Nexletol and it likely would not reduce LDL to goal anyway. Patient has been unable to tolerate high intensity statin and was unable to tolerate ezetimibe. Will add Repatha since this is the only PCSK9i covered on her insurance formulary. Prior authorization required so I submitted prior authorization today via covermymeds (Key: [place]). Rx sent to mail order pharmacy for 90 day supply which should be $90 (total cost of therapy per year = $360). Patient aware and in agreement with plan despite initial reluctance to take an injectable medication. Should the patient have cost concerns if she enters the coverage gap and is unable to afford the co-pay then may need to assess for Praluent patient assistance program.  Continue simvastatin 40 mg by mouth once daily. Depending on LDL response after PCSK9i therapy, may be able to lower dose of simvastatin but will not discontinue given benefits of statins. Recheck lipid panel in 3 months prior to PCP visit. Will follow-up with patient in 4 weeks to assess tolerability of Repatha.   Subjective: Carolyn Hunter is an 70 y.o. year old female who is a primary patient of Noreene Larsson, NP.  The CCM team was consulted for assistance with disease management and care coordination needs.    Engaged with patient face to face for follow  up visit in response to provider referral for pharmacy case management and/or care coordination services.   Consent to Services:  The patient was given information about Chronic Care Management services, agreed to services, and gave verbal consent prior to initiation of services.  Please see initial visit note for detailed documentation.   Patient Care Team: Noreene Larsson, NP as PCP - General (Nurse Practitioner) Arnoldo Lenis, MD as PCP - Cardiology (Cardiology) Beryle Lathe, [facility] (Pharmacist)  Objective:  Lab Results  Component Value Date   CREATININE 0.85 07/30/2021   CREATININE 0.77 03/29/2021   CREATININE 0.78 01/10/2020    Lab Results  Component Value Date   HGBA1C 6.7 (H) 07/30/2021   Last diabetic Eye exam: No results found for: HMDIABEYEEXA  Last diabetic Foot exam: No results found for: HMDIABFOOTEX      Component Value Date/Time   CHOL 207 (H) 07/30/2021 0812   TRIG 120 07/30/2021 0812   HDL 57 07/30/2021 0812   CHOLHDL 3.9 01/10/2020 0803   LDLCALC 129 (H) 07/30/2021 0812   LDLCALC 121 (H) 01/10/2020 0803    Hepatic Function Latest Ref Rng & Units 07/30/2021 03/29/2021 01/10/2020  Total Protein 6.0 - 8.5 g/dL 6.7 7.3 7.1  Albumin 3.8 - 4.8 g/dL 4.4 4.5 -  AST 0 - 40 IU/L 15 13 16   ALT 0 - 32 IU/L 7 6 9   Alk Phosphatase 44 - 121 IU/L 79 79 -  Total Bilirubin 0.0 - 1.2 mg/dL 0.6 0.5 0.5  Bilirubin, Direct 0.0 - 0.2 mg/dL - - -    Lab Results  Component Value Date/Time  TSH 1.420 03/29/2021 08:16 AM   TSH 1.36 01/10/2020 08:03 AM    CBC Latest Ref Rng & Units 07/30/2021 03/29/2021 01/10/2020  WBC 3.4 - 10.8 x10E3/uL 5.4 6.1 5.9  Hemoglobin 11.1 - 15.9 g/dL 13.1 13.3 12.9  Hematocrit 34.0 - 46.6 % 37.3 39.0 37.8  Platelets 150 - 450 x10E3/uL 218 234 210    No results found for: VD25OH  Clinical ASCVD: No  The 10-year ASCVD risk score (Arnett DK, et al., 2019) is: 43.6%   Values used to calculate the score:     Age: 70 years     Sex:  Female     Is Non-Hispanic African American: Yes     Diabetic: Yes     Tobacco smoker: No     Systolic Blood Pressure: 001 mmHg     Is BP treated: Yes     HDL Cholesterol: 57 mg/dL     Total Cholesterol: 207 mg/dL    Social History   Tobacco Use  Smoking Status Never  Smokeless Tobacco Never   BP Readings from Last 3 Encounters:  08/01/21 (!) 178/84  05/02/21 (!) 184/82  03/28/21 (!) 172/80   Pulse Readings from Last 3 Encounters:  08/01/21 (!) 102  05/02/21 (!) 108  03/28/21 (!) 116   Wt Readings from Last 3 Encounters:  08/01/21 143 lb 1.3 oz (64.9 kg)  05/02/21 147 lb (66.7 kg)  03/28/21 150 lb (68 kg)    Assessment: Review of patient past medical history, allergies, medications, health status, including review of consultants reports, laboratory and other test data, was performed as part of comprehensive evaluation and provision of chronic care management services.   SDOH:  (Social Determinants of Health) assessments and interventions performed:    CCM Care Plan  Allergies  Allergen Reactions   Ace Inhibitors Cough   Crestor [Rosuvastatin Calcium] Other (See Comments)    Knee and muscle pain; tolerating simvastatin   Zetia [Ezetimibe] Swelling    Sore throat    Medications Reviewed Today     Reviewed by Beryle Lathe, [facility] (Pharmacist) on 08/01/21 at 1109  Med List Status: <None>   Medication Order Taking? Sig Documenting Provider Last Dose Status Informant  Aflibercept (EYLEA IO) [id number]  Inject 1 Dose into the eye every 30 (thirty) days. [provider]  Active Self  amLODipine (NORVASC) 5 MG tablet [id number] Yes TAKE 1 TABLET BY MOUTH EVERY DAY Noreene Larsson, NP Taking Active   Cholecalciferol (VITAMIN D3) 50 MCG (2000 UT) TABS [id number] Yes Take 2,000 Units by mouth daily. [provider] Taking Active Self  Liniments [facility] PAIN RELIEF PATCH EX) [id number] Yes Place 1 patch onto the skin daily as needed (pain.). [provider] Taking Active Self  metFORMIN (GLUCOPHAGE) 1000 MG tablet [id number] Yes TAKE 1 TABLET BY MOUTH EVERY DAY WITH BREAKFAST Fayrene Helper, MD Taking Active   propranolol ER (INDERAL LA) 60 MG 24 hr capsule [id number] Yes TAKE 1 CAPSULE BY MOUTH EVERY DAY Noreene Larsson, NP Taking Active   simvastatin (ZOCOR) 40 MG tablet [id number] Yes TAKE 1 TABLET BY MOUTH EVERYDAY AT BEDTIME Noreene Larsson, NP Taking Active             Patient Active Problem List   Diagnosis Date Noted   Tachycardia 03/28/2021   Onychomycosis of toenail 03/28/2021   Need for vaccination against Streptococcus pneumoniae using pneumococcal conjugate vaccine 13 01/06/2020   Nonspecific abnormal electrocardiogram (ECG) (EKG) 01/06/2020   Annual visit  for general adult medical examination with abnormal findings 01/05/2020   Visit for screening mammogram 01/05/2020   Screening for osteoporosis 01/05/2020   Essential hypertension 01/05/2020   Encounter for screening colonoscopy 01/05/2020   Controlled type 2 diabetes mellitus without complication, without long-term current use of insulin ([place]) 04/17/2017   White coat syndrome with hypertension 04/17/2017   HLD (hyperlipidemia) 04/17/2017   Tremor 04/17/2017    Immunization History  Administered Date(s) Administered   Fluad Quad(high Dose 65+) 05/11/2020   Influenza,inj,Quad PF,6+ Mos 04/17/2017, 04/20/2018   Influenza-Unspecified 03/13/2021   Moderna SARS-COV2 Booster Vaccination 05/14/2021   Moderna Sars-Covid-2 Vaccination 10/06/2019, 11/04/2019, 06/06/2020   Pneumococcal Conjugate-13 04/17/2017, 01/05/2020   Pneumococcal Polysaccharide-23 01/05/2018    Conditions to be addressed/monitored: HTN, HLD, and DMII  Care Plan : Medication Management  Updates made by Beryle Lathe, [place] since 08/01/2021 12:00 AM     Problem: HLD, T2DM, HTN   Priority: High  Onset Date: 05/28/2021     Long-Range Goal: Disease Progression Prevention    Start Date: 05/28/2021  Expected End Date: 08/26/2021  Recent Progress: On track  Priority: High  Note:   Current Barriers:  Unable to independently monitor therapeutic efficacy Unable to achieve control of hyperlipidemia  Pharmacist Clinical Goal(s):  Through collaboration with PharmD and provider, patient will: achieve adherence to monitoring guidelines and medication adherence to achieve therapeutic efficacy achieve control of hyperlipidemia as evidenced by improved LDL   Interventions: 1:1 collaboration with Noreene Larsson, NP regarding development and update of comprehensive plan of care as evidenced by provider attestation and co-signature Inter-disciplinary care team collaboration (see longitudinal plan of care) Comprehensive medication review performed; medication list updated in electronic medical record  Type 2 Diabetes - Condition stable. Not addressed this visit.: Controlled; Most recent A1c at goal of <7% per ADA guidelines Current medications: metformin 1,000 mg by mouth with breakfast Intolerances: none Taking medications as directed: yes Side effects thought to be attributed to current medication regimen: no Denies hypoglycemic symptoms (sweaty and shaky). Hypoglycemia prevention: not indicated at this time Current meal patterns:  patient reports cutting down on sweets and fried foods Current exercise: not discussed today On a statin: yes On aspirin 81 mg daily: no Last microalbumin: 10.8 (03/29/21); on an ACEi/ARB: no, unable to tolerate ACEi (lisinopril due to cough) Last eye exam: completed within last year Last foot exam: completed within last year Pneumonia vaccine: series complete Influenza vaccine: up to date Shingles: unknown Current glucose readings:  not discussed today. Will discuss more at future visits Continue metformin 1,000 mg by mouth with breakfast Continue to monitor A1c and blood glucose  Hypertension - Condition stable. Not addressed this  visit.: Blood pressure under good control. Blood pressure is at goal of <130/80 mmHg per 2017 AHA/ACC guidelines. Blood pressure high in office but blood pressure machine validated in office and home blood pressure within normal limits Current medications: amlodipine 5 mg by mouth once daily and propranolol 60 mg by mouth once daily Patient taking propranolol for tachycardia/tremor Intolerances:  lisinopril (cough) Taking medications as directed: yes Side effects thought to be attributed to current medication regimen: no Denies dizziness and lightheadedness. Current exercise: not discussed today Current home blood pressure: 104-137/69-83 mmHg Continue amlodipine 5 mg by mouth once daily and propranolol 60 mg by mouth once daily Encourage dietary sodium restriction/DASH diet Recommend regular aerobic exercise Recommend home blood pressure monitoring to discuss at next visit  Hyperlipidemia - Goal on Track (progressing): YES.: Uncontrolled but  improving. LDL 153>129. LDL above goal of <70 due to very high risk given diabetes + at least 1 additional major risk factor (elevated LDL cholesterol and hypertension) per 2020 AACE/ACE guidelines. Triglycerides above goal of <150 per 2020 AACE/ACE guidelines. Current medications: simvastatin 40 mg by mouth once daily and ezetimibe 10 mg by mouth once daily Intolerances:  rosuvastatin (knee and muscle pain); Patient reports a sore throat with ezetimibe so she quit taking it Taking medications as directed: no, patient reports a sore throat with ezetimibe so she quit taking it Side effects thought to be attributed to current medication regimen: no Encourage dietary reduction of high fat containing foods such as butter, nuts, bacon, egg yolks, etc. Recommend regular aerobic exercise Reviewed risks of hyperlipidemia, principles of treatment and consequences of untreated hyperlipidemia Discussed need for medication compliance Patient was shown the proper  administration technique for Repatha autoinjector today in clinic Discussed options with PCP. Patient's insurance does not cover Nexletol and it likely would not reduce LDL to goal anyway. Patient has been unable to tolerate high intensity statin and was unable to tolerate ezetimibe. Will add Repatha since this is the only PCSK9i covered on her insurance formulary. Prior authorization required so I submitted prior authorization today via covermymeds (Key: [place]). Rx sent to mail order pharmacy for 90 day supply which should be $90 (total cost of therapy per year = $360). Patient aware and in agreement with plan despite initial reluctance to take an injectable medication. Should the patient have cost concerns if she enters the coverage gap and is unable to afford the co-pay then may need to assess for Praluent patient assistance program.  Continue simvastatin 40 mg by mouth once daily. Depending on LDL response after PCSK9i therapy, may be able to lower dose of simvastatin but will not discontinue given benefits of statins. Recheck lipid panel in 3 months prior to PCP visit. Will follow-up with patient in 4 weeks to assess tolerability of Repatha.   Patient Goals/Self-Care Activities Patient will:  Take medications as prescribed Check blood sugar once a day at the following times: fasting (at least 8 hours since last food consumption) and whenever patient experiences symptoms of hypo/hyperglycemia, document, and provide at future appointments Check blood pressure at least once daily, document, and provide at future appointments Engage in dietary modifications by less frequent dining out, decreased fat intake, and fewer sweetened foods & beverages  Follow Up Plan: Telephone follow up appointment with care management team member scheduled for: 08/29/21     Medication Assistance: None required.  Patient affirms current coverage meets needs.  Patient's preferred pharmacy is:  [id number]  [place] [place]Phone: [phone]: [phone] [place]([facility] [place]Phone: [phone]: [phone] Follow Up:  Patient agrees to Care Plan and Follow-up.  Plan: Telephone follow up appointment with care management team member scheduled for:  08/29/21  CKennon Holter PharmD, [place] CIberiaClinical Pharmacist Practitioner [facility]arn[facility] [phone]

## 2021-08-01 NOTE — Assessment & Plan Note (Signed)
Lab Results  Component Value Date   HGBA1C 6.7 (H) 07/30/2021   -well controlled -continue current meds

## 2021-08-01 NOTE — Patient Instructions (Signed)
Moshe Salisbury,  It was great to talk to you today!  Please call me with any questions or concerns.   Visit Information  Following are the goals we discussed today:  Patient Goals/Self-Care Activities Patient will:  Take medications as prescribed Check blood sugar once a day at the following times: fasting (at least 8 hours since last food consumption) and whenever patient experiences symptoms of hypo/hyperglycemia, document, and provide at future appointments Check blood pressure at least once daily, document, and provide at future appointments Engage in dietary modifications by less frequent dining out, decreased fat intake, and fewer sweetened foods & beverages  Plan: Telephone follow up appointment with care management team member scheduled for:  08/29/21  Domenic Moras, PharmD, BCACP, CPP Clinical Pharmacist Practitioner [facility] [phone]  Please call the care guide team at [phone] if you need to cancel or reschedule your appointment.   Patient verbalizes understanding of instructions provided today and agrees to view in MyChart.

## 2021-08-01 NOTE — Assessment & Plan Note (Signed)
BP Readings from Last 3 Encounters:  08/01/21 (!) 178/84  05/02/21 (!) 184/82  03/28/21 (!) 172/80   -home BP this AM was 128/75 -no med changes today since home BP has been good

## 2021-08-10 DIAGNOSIS — I10 Essential (primary) hypertension: Secondary | ICD-10-CM

## 2021-08-10 DIAGNOSIS — E782 Mixed hyperlipidemia: Secondary | ICD-10-CM

## 2021-08-10 DIAGNOSIS — E119 Type 2 diabetes mellitus without complications: Secondary | ICD-10-CM

## 2021-08-29 ENCOUNTER — Ambulatory Visit (INDEPENDENT_AMBULATORY_CARE_PROVIDER_SITE_OTHER): Payer: PPO | Admitting: Pharmacist

## 2021-08-29 DIAGNOSIS — E119 Type 2 diabetes mellitus without complications: Secondary | ICD-10-CM

## 2021-08-29 DIAGNOSIS — I10 Essential (primary) hypertension: Secondary | ICD-10-CM

## 2021-08-29 DIAGNOSIS — E782 Mixed hyperlipidemia: Secondary | ICD-10-CM

## 2021-08-29 MED ORDER — ROSUVASTATIN CALCIUM 40 MG PO TABS: 40.0000 mg | ORAL_TABLET | Freq: Every day | ORAL | 3 refills | Status: DC

## 2021-08-29 NOTE — Chronic Care Management (AMB) (Signed)
Chronic Care Management Pharmacy Note  08/29/2021 Name:  Carolyn Hunter MRN:  [id number] DOB:  Dec 28, 1950  Summary: Hyperlipidemia Uncontrolled but improving. LDL 153>129. LDL above goal of <70 due to very high risk given diabetes + at least 1 additional major risk factor (elevated LDL cholesterol and hypertension) per 2020 AACE/ACE guidelines. Triglycerides above goal of <150 per 2020 AACE/ACE guidelines. Repatha PA completed at last visit and has been approved thru 08/01/22 Intolerances: rosuvastatin (knee and muscle pain 20+ years ago); ezetimibe (sore throat) Patient has not started Repatha and reports "I do not want to take an injection for the rest of my life. I would prefer a pill. I am willing to try rosuvastatin again since it has been over 20 years since I tried it." Discussed options with primary care provider and per patient's wishes, will switch from simvastatin 40 mg daily to rosuvastatin 40 mg daily to see if she can tolerate and if it is able to control LDL. If not, patient reports she is willing to try PCSK9i (Repatha) but only if oral options are exhausted. Unfortunately, patient was unable to tolerate ezetimibe. Nexletol was added to patient's formulary as of 08/11/21 (Tier 4) so could consider that as another option. If cost is a concern, can help patient get access to grant funding through [place] or [employer] if available. Will discontinue Repatha at this time Recheck lipid panel in 2 months prior to PCP visit. Will follow-up with patient in 4 weeks to assess tolerability of rosuvastatin.   Subjective: Carolyn Hunter is an 71 y.o. year old female who is a primary patient of Noreene Larsson, NP.  The CCM team was consulted for assistance with disease management and care coordination needs.    Engaged with patient by telephone for follow up visit in response to provider referral for pharmacy case management and/or care coordination services.   Consent to Services:   The patient was given information about Chronic Care Management services, agreed to services, and gave verbal consent prior to initiation of services.  Please see initial visit note for detailed documentation.   Patient Care Team: Noreene Larsson, NP as PCP - General (Nurse Practitioner) Arnoldo Lenis, MD as PCP - Cardiology (Cardiology) Beryle Lathe, [facility] (Pharmacist)  Objective:  Lab Results  Component Value Date   CREATININE 0.85 07/30/2021   CREATININE 0.77 03/29/2021   CREATININE 0.78 01/10/2020    Lab Results  Component Value Date   HGBA1C 6.7 (H) 07/30/2021   Last diabetic Eye exam: No results found for: HMDIABEYEEXA  Last diabetic Foot exam: No results found for: HMDIABFOOTEX      Component Value Date/Time   CHOL 207 (H) 07/30/2021 0812   TRIG 120 07/30/2021 0812   HDL 57 07/30/2021 0812   CHOLHDL 3.9 01/10/2020 0803   LDLCALC 129 (H) 07/30/2021 0812   LDLCALC 121 (H) 01/10/2020 0803    Hepatic Function Latest Ref Rng & Units 07/30/2021 03/29/2021 01/10/2020  Total Protein 6.0 - 8.5 g/dL 6.7 7.3 7.1  Albumin 3.8 - 4.8 g/dL 4.4 4.5 -  AST 0 - 40 IU/L 15 13 16   ALT 0 - 32 IU/L 7 6 9   Alk Phosphatase 44 - 121 IU/L 79 79 -  Total Bilirubin 0.0 - 1.2 mg/dL 0.6 0.5 0.5  Bilirubin, Direct 0.0 - 0.2 mg/dL - - -    Lab Results  Component Value Date/Time   TSH 1.420 03/29/2021 08:16 AM   TSH 1.36 01/10/2020 08:03  AM    CBC Latest Ref Rng & Units 07/30/2021 03/29/2021 01/10/2020  WBC 3.4 - 10.8 x10E3/uL 5.4 6.1 5.9  Hemoglobin 11.1 - 15.9 g/dL 13.1 13.3 12.9  Hematocrit 34.0 - 46.6 % 37.3 39.0 37.8  Platelets 150 - 450 x10E3/uL 218 234 210    No results found for: VD25OH  Clinical ASCVD: No  The 10-year ASCVD risk score (Arnett DK, et al., 2019) is: 43.6%   Values used to calculate the score:     Age: 42 years     Sex: Female     Is Non-Hispanic African American: Yes     Diabetic: Yes     Tobacco smoker: No     Systolic Blood Pressure: 709  mmHg     Is BP treated: Yes     HDL Cholesterol: 57 mg/dL     Total Cholesterol: 207 mg/dL    Social History   Tobacco Use  Smoking Status Never  Smokeless Tobacco Never   BP Readings from Last 3 Encounters:  08/01/21 (!) 178/84  05/02/21 (!) 184/82  03/28/21 (!) 172/80   Pulse Readings from Last 3 Encounters:  08/01/21 (!) 102  05/02/21 (!) 108  03/28/21 (!) 116   Wt Readings from Last 3 Encounters:  08/01/21 143 lb 1.3 oz (64.9 kg)  05/02/21 147 lb (66.7 kg)  03/28/21 150 lb (68 kg)    Assessment: Review of patient past medical history, allergies, medications, health status, including review of consultants reports, laboratory and other test data, was performed as part of comprehensive evaluation and provision of chronic care management services.   SDOH:  (Social Determinants of Health) assessments and interventions performed:    CCM Care Plan  Allergies  Allergen Reactions   Ace Inhibitors Cough   Crestor [Rosuvastatin Calcium] Other (See Comments)    Knee and muscle pain; tolerating simvastatin   Zetia [Ezetimibe] Swelling    Sore throat    Medications Reviewed Today     Reviewed by Beryle Lathe, [facility] (Pharmacist) on 08/29/21 at 1126  Med List Status: <None>   Medication Order Taking? Sig Documenting Provider Last Dose Status Informant  Aflibercept (EYLEA IO) [id number] No Inject 1 Dose into the eye every 30 (thirty) days. [provider] Unknown Active Self  amLODipine (NORVASC) 5 MG tablet [id number] Yes TAKE 1 TABLET BY MOUTH EVERY DAY Noreene Larsson, NP Taking Active   Cholecalciferol (VITAMIN D3) 50 MCG (2000 UT) TABS [id number] Yes Take 2,000 Units by mouth daily. [provider] Taking Active Self  Liniments [facility] PAIN RELIEF PATCH EX) [id number] Yes Place 1 patch onto the skin daily as needed (pain.). [provider] Taking Active Self  metFORMIN (GLUCOPHAGE) 1000 MG tablet [id number] Yes TAKE 1 TABLET BY MOUTH EVERY  DAY WITH BREAKFAST Fayrene Helper, MD Taking Active   propranolol ER (INDERAL LA) 60 MG 24 hr capsule [id number] Yes TAKE 1 CAPSULE BY MOUTH EVERY DAY Noreene Larsson, NP Taking Active   simvastatin (ZOCOR) 40 MG tablet [id number] Yes TAKE 1 TABLET BY MOUTH EVERYDAY AT BEDTIME Noreene Larsson, NP Taking Active             Patient Active Problem List   Diagnosis Date Noted   Tachycardia 03/28/2021   Onychomycosis of toenail 03/28/2021   Need for vaccination against Streptococcus pneumoniae using pneumococcal conjugate vaccine 13 01/06/2020   Nonspecific abnormal electrocardiogram (ECG) (EKG) 01/06/2020   Annual visit for general adult medical examination with abnormal findings 01/05/2020  Visit for screening mammogram 01/05/2020   Screening for osteoporosis 01/05/2020   Essential hypertension 01/05/2020   Encounter for screening colonoscopy 01/05/2020   Controlled type 2 diabetes mellitus without complication, without long-term current use of insulin ([place]) 04/17/2017   White coat syndrome with hypertension 04/17/2017   HLD (hyperlipidemia) 04/17/2017   Tremor 04/17/2017    Immunization History  Administered Date(s) Administered   Fluad Quad(high Dose 65+) 05/11/2020   Influenza,inj,Quad PF,6+ Mos 04/17/2017, 04/20/2018   Influenza-Unspecified 03/13/2021   Moderna SARS-COV2 Booster Vaccination 05/14/2021   Moderna Sars-Covid-2 Vaccination 10/06/2019, 11/04/2019, 06/06/2020   Pneumococcal Conjugate-13 04/17/2017, 01/05/2020   Pneumococcal Polysaccharide-23 01/05/2018    Conditions to be addressed/monitored: HTN, HLD, and DMII  Care Plan : Medication Management  Updates made by Beryle Lathe, [place] since 08/29/2021 12:00 AM     Problem: HLD, T2DM, HTN   Priority: High  Onset Date: 05/28/2021     Long-Range Goal: Disease Progression Prevention   Start Date: 05/28/2021  Expected End Date: 08/26/2021  Recent Progress: On track  Priority: High  Note:   Current  Barriers:  Unable to independently monitor therapeutic efficacy Unable to achieve control of hyperlipidemia  Pharmacist Clinical Goal(s):  Through collaboration with PharmD and provider, patient will: achieve adherence to monitoring guidelines and medication adherence to achieve therapeutic efficacy achieve control of hyperlipidemia as evidenced by improved LDL   Interventions: 1:1 collaboration with Vena Rua, NP regarding development and update of comprehensive plan of care as evidenced by provider attestation and co-signature Inter-disciplinary care team collaboration (see longitudinal plan of care) Comprehensive medication review performed; medication list updated in electronic medical record  Type 2 Diabetes - Condition stable. Not addressed this visit.: Controlled; Most recent A1c at goal of <7% per ADA guidelines Current medications: metformin 1,000 mg by mouth with breakfast Intolerances: none Taking medications as directed: yes Side effects thought to be attributed to current medication regimen: no Denies hypoglycemic symptoms (sweaty and shaky). Hypoglycemia prevention: not indicated at this time Current meal patterns:  patient reports cutting down on sweets and fried foods Current exercise: not discussed today On a statin: yes On aspirin 81 mg daily: no Last microalbumin: 10.8 (03/29/21); on an ACEi/ARB: no, unable to tolerate ACEi (lisinopril due to cough) Last eye exam: completed within last year Last foot exam: completed within last year Pneumonia vaccine: series complete Influenza vaccine: up to date Shingles: unknown Current glucose readings:  not discussed today. Will discuss more at future visits Continue metformin 1,000 mg by mouth with breakfast Continue to monitor A1c and blood glucose  Hypertension - Condition stable. Not addressed this visit.: Blood pressure under good control. Blood pressure is at goal of <130/80 mmHg per 2017 AHA/ACC guidelines.  Blood pressure high in office but blood pressure machine validated in office and home blood pressure within normal limits Current medications: amlodipine 5 mg by mouth once daily and propranolol 60 mg by mouth once daily Patient taking propranolol for tachycardia/tremor Intolerances:  lisinopril (cough) Taking medications as directed: yes Side effects thought to be attributed to current medication regimen: no Denies dizziness and lightheadedness. Current exercise: not discussed today Current home blood pressure: 104-137/69-83 mmHg Continue amlodipine 5 mg by mouth once daily and propranolol 60 mg by mouth once daily Encourage dietary sodium restriction/DASH diet Recommend regular aerobic exercise Recommend home blood pressure monitoring to discuss at next visit  Hyperlipidemia - Goal on Track (progressing): YES.: Uncontrolled but improving. LDL 153>129. LDL above goal of <70 due to very high  risk given diabetes + at least 1 additional major risk factor (elevated LDL cholesterol and hypertension) per 2020 AACE/ACE guidelines. Triglycerides above goal of <150 per 2020 AACE/ACE guidelines. Current medications: simvastatin 40 mg by mouth once daily and evolocumab (Repatha) 140 mg subcutaneously every 2 weeks Repatha PA completed at last visit and has been approved thru 08/01/22 Intolerances:  rosuvastatin (knee and muscle pain 20+ years ago); ezetimibe (sore throat) Taking medications as directed: no, patient has not started Repatha and reports "I do not want to take an injection for the rest of my life. I would prefer a pill. I am willing to try rosuvastatin again since it has been over 20 years since I tried it." Side effects thought to be attributed to current medication regimen: no Encourage dietary reduction of high fat containing foods such as butter, nuts, bacon, egg yolks, etc. Recommend regular aerobic exercise Reviewed risks of hyperlipidemia, principles of treatment and consequences of  untreated hyperlipidemia Discussed need for medication compliance Patient was shown the proper administration technique for Repatha autoinjector at prior clinic visit.  Discussed options with primary care provider and per patient's wishes, will switch from simvastatin 40 mg daily to rosuvastatin 40 mg daily to see if she can tolerate and if it is able to control LDL. If not, patient reports she is willing to try PCSK9i (Repatha) but only if oral options are exhausted. Unfortunately, patient was unable to tolerate ezetimibe. Nexletol was added to patient's formulary as of 08/11/21 (Tier 4) so could consider that as another option. If cost is a concern, can help patient get access to grant funding through [place] or [employer] if available. Will discontinue Repatha at this time Recheck lipid panel in 2 months prior to PCP visit. Will follow-up with patient in 4 weeks to assess tolerability of rosuvastatin.   Patient Goals/Self-Care Activities Patient will:  Take medications as prescribed Check blood sugar once a day at the following times: fasting (at least 8 hours since last food consumption) and whenever patient experiences symptoms of hypo/hyperglycemia, document, and provide at future appointments Check blood pressure at least once daily, document, and provide at future appointments Engage in dietary modifications by less frequent dining out, decreased fat intake, and fewer sweetened foods & beverages  Follow Up Plan: Telephone follow up appointment with care management team member scheduled for: 09/24/21     Medication Assistance: None required.  Patient affirms current coverage meets needs.  Patient's preferred pharmacy is:  [id number] [place] [place]Phone: [phone]: [phone] [place] [place] [place]Phone: [phone]: [phone] Follow Up:  Patient agrees to Care Plan and Follow-up.  Plan: Telephone follow up appointment with care management team member scheduled for:  09/24/21  CKennon Holter PharmD, BSidell CBuffaloClinical Pharmacist Practitioner [facility] [phone]

## 2021-08-29 NOTE — Patient Instructions (Signed)
Carolyn Hunter,  It was great to talk to you today!  Please call me with any questions or concerns.  Visit Information  Following are the goals we discussed today:   Goals Addressed             This Visit's Progress    Medication Management       Patient Goals/Self-Care Activities Patient will:  Take medications as prescribed Check blood sugar once a day at the following times: fasting (at least 8 hours since last food consumption) and whenever patient experiences symptoms of hypo/hyperglycemia, document, and provide at future appointments Check blood pressure at least once daily, document, and provide at future appointments Engage in dietary modifications by less frequent dining out, decreased fat intake, and fewer sweetened foods & beverages          Follow-up plan: Telephone follow up appointment with care management team member scheduled for:  09/24/21  Patient verbalizes understanding of instructions and care plan provided today and agrees to view in MyChart. Active MyChart status confirmed with patient.    Please call the care guide team at [phone] if you need to cancel or reschedule your appointment.   Domenic Moras, PharmD, Patsy [place], CPP Clinical Pharmacist Practitioner [facility] [phone]

## 2021-09-10 DIAGNOSIS — E782 Mixed hyperlipidemia: Secondary | ICD-10-CM | POA: Diagnosis not present

## 2021-09-10 DIAGNOSIS — I10 Essential (primary) hypertension: Secondary | ICD-10-CM | POA: Diagnosis not present

## 2021-09-10 DIAGNOSIS — E119 Type 2 diabetes mellitus without complications: Secondary | ICD-10-CM

## 2021-09-24 ENCOUNTER — Ambulatory Visit (INDEPENDENT_AMBULATORY_CARE_PROVIDER_SITE_OTHER): Payer: PPO | Admitting: Pharmacist

## 2021-09-24 DIAGNOSIS — E119 Type 2 diabetes mellitus without complications: Secondary | ICD-10-CM

## 2021-09-24 DIAGNOSIS — E782 Mixed hyperlipidemia: Secondary | ICD-10-CM

## 2021-09-24 DIAGNOSIS — I10 Essential (primary) hypertension: Secondary | ICD-10-CM

## 2021-09-24 NOTE — Patient Instructions (Signed)
Carolyn Hunter,  It was great to talk to you today!  Please call me with any questions or concerns.  Visit Information  Following are the goals we discussed today:   Goals Addressed             This Visit's Progress    Medication Management       Patient Goals/Self-Care Activities Patient will:  Take medications as prescribed Check blood sugar once a day at the following times: fasting (at least 8 hours since last food consumption) and whenever patient experiences symptoms of hypo/hyperglycemia, document, and provide at future appointments Check blood pressure at least once daily, document, and provide at future appointments Engage in dietary modifications by less frequent dining out, decreased fat intake, and fewer sweetened foods & beverages           Follow-up plan: Face to Face appointment with care management team member scheduled for: 10/30/21 Future Appointments  Date Time Provider Department Center  10/02/2021  1:30 PM Rennis Chris, MD [facility] None  10/30/2021 10:20 AM Donell Beers, FNP [facility]  10/30/2021 10:45 AM R[facility] PHARMACIST [facility]  02/21/2022  1:40 PM [facility] NURSE [facility] RPC    Patient verbalizes understanding of instructions and care plan provided today and agrees to view in MyChart. Active MyChart status confirmed with patient.    Please call the care guide team at [phone] if you need to cancel or reschedule your appointment.   Domenic Moras, PharmD, Patsy [place], CPP Clinical Pharmacist Practitioner [facility] [phone]

## 2021-09-24 NOTE — Chronic Care Management (AMB) (Signed)
Chronic Care Management Pharmacy Note  09/24/2021 Name:  Carolyn Hunter MRN:  [id number] DOB:  1951-07-26  Summary: Hyperlipidemia Uncontrolled but improving. LDL 153>129. LDL above goal of <70 due to very high risk given diabetes + at least 1 additional major risk factor (elevated LDL cholesterol and hypertension) per 2020 AACE/ACE guidelines. Triglycerides above goal of <150 per 2020 AACE/ACE guidelines. Recent change: switched from simvastatin 40 mg daily to rosuvastatin 40 mg daily Intolerances:  ezetimibe (sore throat); patient reports tolerating rosuvastatin 40 mg daily well over the last month (allergy removed from profile with patient's permission) Continue rosuvastatin 40 mg daily  Recheck lipid panel in 1 month prior to PCP visit  Subjective: Carolyn Hunter is an 71 y.o. year old female who is a primary patient of Carolyn Larsson, NP.  The CCM team was consulted for assistance with disease management and care coordination needs.    Engaged with patient by telephone for follow up visit in response to provider referral for pharmacy case management and/or care coordination services.   Consent to Services:  The patient was given information about Chronic Care Management services, agreed to services, and gave verbal consent prior to initiation of services.  Please see initial visit note for detailed documentation.   Patient Care Team: Carolyn Larsson, NP as PCP - General (Nurse Practitioner) Arnoldo Lenis, MD as PCP - Cardiology (Cardiology) Beryle Lathe, [facility] (Pharmacist)  Objective:  Lab Results  Component Value Date   CREATININE 0.85 07/30/2021   CREATININE 0.77 03/29/2021   CREATININE 0.78 01/10/2020    Lab Results  Component Value Date   HGBA1C 6.7 (H) 07/30/2021   Last diabetic Eye exam: No results found for: HMDIABEYEEXA  Last diabetic Foot exam: No results found for: HMDIABFOOTEX      Component Value Date/Time   CHOL 207 (H) 07/30/2021 0812    TRIG 120 07/30/2021 0812   HDL 57 07/30/2021 0812   CHOLHDL 3.9 01/10/2020 0803   LDLCALC 129 (H) 07/30/2021 0812   LDLCALC 121 (H) 01/10/2020 0803    Hepatic Function Latest Ref Rng & Units 07/30/2021 03/29/2021 01/10/2020  Total Protein 6.0 - 8.5 g/dL 6.7 7.3 7.1  Albumin 3.8 - 4.8 g/dL 4.4 4.5 -  AST 0 - 40 IU/L 15 13 16   ALT 0 - 32 IU/L 7 6 9   Alk Phosphatase 44 - 121 IU/L 79 79 -  Total Bilirubin 0.0 - 1.2 mg/dL 0.6 0.5 0.5  Bilirubin, Direct 0.0 - 0.2 mg/dL - - -    Lab Results  Component Value Date/Time   TSH 1.420 03/29/2021 08:16 AM   TSH 1.36 01/10/2020 08:03 AM    CBC Latest Ref Rng & Units 07/30/2021 03/29/2021 01/10/2020  WBC 3.4 - 10.8 x10E3/uL 5.4 6.1 5.9  Hemoglobin 11.1 - 15.9 g/dL 13.1 13.3 12.9  Hematocrit 34.0 - 46.6 % 37.3 39.0 37.8  Platelets 150 - 450 x10E3/uL 218 234 210    No results found for: VD25OH  Clinical ASCVD: No  The 10-year ASCVD risk score (Arnett DK, et al., 2019) is: 43.6%   Values used to calculate the score:     Age: 39 years     Sex: Female     Is Non-Hispanic African American: Yes     Diabetic: Yes     Tobacco smoker: No     Systolic Blood Pressure: 382 mmHg     Is BP treated: Yes     HDL Cholesterol: 57 mg/dL  Total Cholesterol: 207 mg/dL    Social History   Tobacco Use  Smoking Status Never  Smokeless Tobacco Never   BP Readings from Last 3 Encounters:  08/01/21 (!) 178/84  05/02/21 (!) 184/82  03/28/21 (!) 172/80   Pulse Readings from Last 3 Encounters:  08/01/21 (!) 102  05/02/21 (!) 108  03/28/21 (!) 116   Wt Readings from Last 3 Encounters:  08/01/21 143 lb 1.3 oz (64.9 kg)  05/02/21 147 lb (66.7 kg)  03/28/21 150 lb (68 kg)    Assessment: Review of patient past medical history, allergies, medications, health status, including review of consultants reports, laboratory and other test data, was performed as part of comprehensive evaluation and provision of chronic care management services.   SDOH:  (Social  Determinants of Health) assessments and interventions performed:    CCM Care Plan  Allergies  Allergen Reactions   Ace Inhibitors Cough   Zetia [Ezetimibe] Swelling    Sore throat    Medications Reviewed Today     Reviewed by Beryle Lathe, [facility] (Pharmacist) on 09/24/21 at 1125  Med List Status: <None>   Medication Order Taking? Sig Documenting Provider Last Dose Status Informant  Aflibercept (EYLEA IO) [id number] No Inject 1 Dose into the eye every 30 (thirty) days.  Patient not taking: Reported on 09/24/2021   [provider] Not Taking Active Self  amLODipine (NORVASC) 5 MG tablet [id number] Yes TAKE 1 TABLET BY MOUTH EVERY DAY Carolyn Larsson, NP Taking Active   Cholecalciferol (VITAMIN D3) 50 MCG (2000 UT) TABS [id number] Yes Take 2,000 Units by mouth daily. [provider] Taking Active Self  [facility] PAIN RELIEF PATCH EX) [id number] Yes Place 1 patch onto the skin daily as needed (pain.). [provider] Taking Active Self  metFORMIN (GLUCOPHAGE) 1000 MG tablet [id number] Yes TAKE 1 TABLET BY MOUTH EVERY DAY WITH BREAKFAST Fayrene Helper, MD Taking Active   propranolol ER (INDERAL LA) 60 MG 24 hr capsule [id number] Yes TAKE 1 CAPSULE BY MOUTH EVERY DAY Carolyn Larsson, NP Taking Active   rosuvastatin (CRESTOR) 40 MG tablet [id number] Yes Take 1 tablet (40 mg total) by mouth daily. Renee Rival, FNP Taking Active             Patient Active Problem List   Diagnosis Date Noted   Tachycardia 03/28/2021   Onychomycosis of toenail 03/28/2021   Need for vaccination against Streptococcus pneumoniae using pneumococcal conjugate vaccine 13 01/06/2020   Nonspecific abnormal electrocardiogram (ECG) (EKG) 01/06/2020   Annual visit for general adult medical examination with abnormal findings 01/05/2020   Visit for screening mammogram 01/05/2020   Screening for osteoporosis 01/05/2020   Essential hypertension 01/05/2020   Encounter  for screening colonoscopy 01/05/2020   Controlled type 2 diabetes mellitus without complication, without long-term current use of insulin ([place]) 04/17/2017   White coat syndrome with hypertension 04/17/2017   HLD (hyperlipidemia) 04/17/2017   Tremor 04/17/2017    Immunization History  Administered Date(s) Administered   Fluad Quad(high Dose 65+) 05/11/2020   Influenza,inj,Quad PF,6+ Mos 04/17/2017, 04/20/2018   Influenza-Unspecified 03/13/2021   Moderna SARS-COV2 Booster Vaccination 05/14/2021   Moderna Sars-Covid-2 Vaccination 10/06/2019, 11/04/2019, 06/06/2020   Pneumococcal Conjugate-13 04/17/2017, 01/05/2020   Pneumococcal Polysaccharide-23 01/05/2018    Conditions to be addressed/monitored: HTN, HLD, and DMII  Care Plan : Medication Management  Updates made by Beryle Lathe, [place] since 09/24/2021 12:00 AM     Problem: HLD, T2DM, HTN   Priority: High  Onset Date: 05/28/2021     Long-Range Goal: Disease Progression Prevention   Start Date: 05/28/2021  Expected End Date: 08/26/2021  Recent Progress: On track  Priority: High  Note:   Current Barriers:  Unable to independently monitor therapeutic efficacy Unable to achieve control of hyperlipidemia  Pharmacist Clinical Goal(s):  Through collaboration with PharmD and provider, patient will: Achieve adherence to monitoring guidelines and medication adherence to achieve therapeutic efficacy Achieve control of hyperlipidemia as evidenced by improved LDL   Interventions: 1:1 collaboration with Vena Rua, NP regarding development and update of comprehensive plan of care as evidenced by provider attestation and co-signature Inter-disciplinary care team collaboration (see longitudinal plan of care) Comprehensive medication review performed; medication list updated in electronic medical record  Type 2 Diabetes - Condition stable. Not addressed this visit.: Controlled; Most recent A1c at goal of <7% per ADA  guidelines Current medications: metformin 1,000 mg by mouth with breakfast Intolerances: none Taking medications as directed: yes Side effects thought to be attributed to current medication regimen: no Denies hypoglycemic symptoms (sweaty and shaky). Hypoglycemia prevention: not indicated at this time Current meal patterns:  patient reports cutting down on sweets and fried foods Current exercise: not discussed today On a statin: yes On aspirin 81 mg daily: no Last microalbumin: 10.8 (03/29/21); on an ACEi/ARB: no, unable to tolerate ACEi (lisinopril due to cough) Last eye exam: completed within last year Last foot exam: completed within last year Pneumonia vaccine: series complete Influenza vaccine: up to date Shingles: unknown Current glucose readings:  not discussed today. Will discuss more at future visits Continue metformin 1,000 mg by mouth with breakfast Continue to monitor A1c and blood glucose  Hypertension - Condition stable. Not addressed this visit.: Blood pressure under good control. Blood pressure is at goal of <130/80 mmHg per 2017 AHA/ACC guidelines. Blood pressure high in office but blood pressure machine validated in office and home blood pressure within normal limits Current medications: amlodipine 5 mg by mouth once daily and propranolol 60 mg by mouth once daily Patient taking propranolol for tachycardia/tremor Intolerances:  lisinopril (cough) Taking medications as directed: yes Side effects thought to be attributed to current medication regimen: no Denies dizziness and lightheadedness. Current exercise: not discussed today Current home blood pressure: not discussed today but previously <130/80 mmHg Continue amlodipine 5 mg by mouth once daily and propranolol 60 mg by mouth once daily Encourage dietary sodium restriction/DASH diet Recommend regular aerobic exercise Recommend home blood pressure monitoring to discuss at next visit  Hyperlipidemia - Goal on Track  (progressing): YES.: Uncontrolled but improving. LDL 153>129. LDL above goal of <70 due to very high risk given diabetes + at least 1 additional major risk factor (elevated LDL cholesterol and hypertension) per 2020 AACE/ACE guidelines. Triglycerides above goal of <150 per 2020 AACE/ACE guidelines. Current medications: rosuvastatin 40 mg by mouth once daily Recent change: switched from simvastatin 40 mg daily to rosuvastatin 40 mg daily Intolerances:  ezetimibe (sore throat); patient reports tolerating rosuvastatin 40 mg daily well over the last month (allergy removed from profile with patient's permission) Taking medications as directed: yes Side effects thought to be attributed to current medication regimen: no Encourage dietary reduction of high fat containing foods such as butter, nuts, bacon, egg yolks, etc. Recommend regular aerobic exercise Reviewed risks of hyperlipidemia, principles of treatment and consequences of untreated hyperlipidemia Discussed need for medication compliance Continue rosuvastatin 40 mg daily  Recheck lipid panel in 1 month prior to PCP visit  Patient Goals/Self-Care  Activities Patient will:  Take medications as prescribed Check blood sugar once a day at the following times: fasting (at least 8 hours since last food consumption) and whenever patient experiences symptoms of hypo/hyperglycemia, document, and provide at future appointments Check blood pressure at least once daily, document, and provide at future appointments Engage in dietary modifications by less frequent dining out, decreased fat intake, and fewer sweetened foods & beverages  Follow Up Plan: Face to Face appointment with care management team member scheduled for: 10/30/21     Medication Assistance: None required.  Patient affirms current coverage meets needs.  Patient's preferred pharmacy is:  [id number] [place] [place] [place]Phone: [phone]: [phone] [place]([facility] [place]Phone: [phone]: [phone] Follow Up:  Patient agrees to Care Plan and Follow-up.  Plan: Face to Face appointment with care management team member scheduled for: 10/30/21  CKennon Holter PharmD, BMahar[place] CPP Clinical Pharmacist Practitioner [facility] [phone]

## 2021-09-26 NOTE — Progress Notes (Signed)
[facility] Note  10/02/2021     CHIEF COMPLAINT Patient presents for Retina Follow Up    HISTORY OF PRESENT ILLNESS: Carolyn Hunter is a 71 y.o. female who presents to the clinic today for:   HPI     Retina Follow Up   Patient presents with  Diabetic Retinopathy.  In both eyes.  Duration of 10 weeks.  Since onset it is stable.  I, the attending physician,  performed the HPI with the patient and updated documentation appropriately.        Comments   10 week follow up NPDR OU-  Patient thinks eyes are doing good.   A1C 6.7, BS 130 this morning      Last edited by Bernarda Caffey, MD on 10/03/2021 10:40 PM.     Referring physician: Noreene Larsson, NP No address on file  HISTORICAL INFORzMATION:   Selected notes from the MEDICAL RECORD NUMBER Referral from Dr. Jeb Levering for DM exam   CURRENT MEDICATIONS: Current Outpatient Medications (Ophthalmic Drugs)  Medication Sig   Aflibercept (EYLEA IO) Inject 1 Dose into the eye every 30 (thirty) days. (Patient not taking: Reported on 09/24/2021)   No current facility-administered medications for this visit. (Ophthalmic Drugs)   Current Outpatient Medications (Other)  Medication Sig   amLODipine (NORVASC) 5 MG tablet TAKE 1 TABLET BY MOUTH EVERY DAY   Cholecalciferol (VITAMIN D3) 50 MCG (2000 UT) TABS Take 2,000 Units by mouth daily.   Liniments (SALONPAS PAIN RELIEF PATCH EX) Place 1 patch onto the skin daily as needed (pain.).   metFORMIN (GLUCOPHAGE) 1000 MG tablet TAKE 1 TABLET BY MOUTH EVERY DAY WITH BREAKFAST   propranolol ER (INDERAL LA) 60 MG 24 hr capsule TAKE 1 CAPSULE BY MOUTH EVERY DAY   rosuvastatin (CRESTOR) 40 MG tablet Take 1 tablet (40 mg total) by mouth daily.   No current facility-administered medications for this visit. (Other)   REVIEW OF SYSTEMS: ROS   Positive for: Neurological, Endocrine, Eyes Negative for: Constitutional, Gastrointestinal, Skin, Genitourinary,  Musculoskeletal, HENT, Cardiovascular, Respiratory, Psychiatric, Allergic/Imm, Heme/Lymph Last edited by Leonie Douglas, COA on 10/02/2021  1:41 PM.     ALLERGIES Allergies  Allergen Reactions   Ace Inhibitors Cough   Zetia [Ezetimibe] Swelling    Sore throat   PAST MEDICAL HISTORY Past Medical History:  Diagnosis Date   Anxiety    Cataract    OS   Diabetes mellitus without complication ([place])    Diabetic retinopathy ([place])    NPDR OU   Hyperlipidemia    Hypertension    Hypertensive retinopathy    OU   Tremors of nervous system    "just when I get in a nervous Situation".   Past Surgical History:  Procedure Laterality Date   CATARACT EXTRACTION W/PHACO Right 10/18/2018   Procedure: CATARACT EXTRACTION PHACO AND INTRAOCULAR LENS PLACEMENT (IOC);  Surgeon: Baruch Goldmann, MD;  Location: [facility];  Service: Ophthalmology;  Laterality: Right;  CDE: 8.11   CATARACT EXTRACTION W/PHACO Left 01/17/2019   Procedure: CATARACT EXTRACTION PHACO AND INTRAOCULAR LENS PLACEMENT (IOC);  Surgeon: Baruch Goldmann, MD;  Location: [facility];  Service: Ophthalmology;  Laterality: Left;  CDE: 6.38   CESAREAN SECTION     1988   COLONOSCOPY WITH PROPOFOL N/A 05/04/2020   Procedure: COLONOSCOPY WITH PROPOFOL;  Surgeon: Harvel Quale, MD;  Location: [facility];  Service: Gastroenterology;  Laterality: N/A;  830   EYE SURGERY Right  Cataract extraction OD   POLYPECTOMY  05/04/2020   Procedure: POLYPECTOMY;  Surgeon: Montez Morita, Quillian Quince, MD;  Location: [facility];  Service: Gastroenterology;;   FAMILY HISTORY Family History  Problem Relation Age of Onset   Cancer Mother        breast and colon cancer   Breast cancer Mother    Diabetes Brother    Diabetes Son    Asthma Father        COPD   Diabetes Son    SOCIAL HISTORY Social History   Tobacco Use   Smoking status: Never   Smokeless tobacco: Never  Vaping Use   Vaping Use: Never used  Substance Use Topics   Alcohol use:  No   Drug use: No       OPHTHALMIC EXAM:  Base Eye Exam     Visual Acuity (Snellen - Linear)       Right Left   Dist [place] 20/25 -2 20/20 -2   Dist ph [place] 20/20          Tonometry (Tonopen, 1:46 PM)       Right Left   Pressure 11 11         Pupils       Dark Light Shape React APD   Right 3 2 Round Minimal None   Left 3 2 Round Minimal None         Visual Fields (Counting fingers)       Left Right    Full Full         Extraocular Movement       Right Left    Full Full         Neuro/Psych     Oriented x3: Yes   Mood/Affect: Normal         Dilation     Both eyes: 1.0% Mydriacyl, 2.5% Phenylephrine @ 1:46 PM           Slit Lamp and Fundus Exam     Slit Lamp Exam       Right Left   Lids/Lashes Dermatochalasis - upper lid, Meibomian gland dysfunction Dermatochalasis - upper lid, Meibomian gland dysfunction   Conjunctiva/Sclera White and quiet White and quiet   Cornea Mild Arcus, Well healed temporal cataract wounds, trace Punctate epithelial erosions Mild Arcus, EBMD, trace Punctate epithelial erosions, Well healed temporal cataract wounds, mild Debris in tear film   Anterior Chamber Deep, 1+cell/pigment Deep and quiet   Iris Round and dilated, No NVI, PPM nasally Round and dilated, No NVI   Lens PC IOL in good position PC IOL in good position, trace PCO   Anterior Vitreous Vitreous syneresis, mild vitreous condensations, Posterior vitreous detachment, old, white VH settled inferiorly Vitreous syneresis         Fundus Exam       Right Left   Disc mild pallor, Sharp rim mild pallor, Sharp rim   C/D Ratio 0.4 0.4   Macula Flat, good foveal reflex, interval improvement in central edema, trace exudates and ERM temporal macula, no heme, trace persistent cystic changes Flat, blunted foveal reflex, epiretinal membrane, scattered MA, RPE mottling and clumping, cluster of MA/exudate temporal macula with +central cystic changes - improved    Vessels attenuated, Tortuous Vascular attenuation, Tortuous   Periphery Attached, scattered MAs, DBH, exudates inferior to disc - resolved, inferior retinal partially obscured by vitreous condensations / old VH attached, scattered DBH greatest nasally and IN, focal exudates nasal midzone - improved  IMAGING AND PROCEDURES  Imaging and Procedures for 12/10/17  OCT, Retina - OU - Both Eyes       Right Eye Quality was good. Central Foveal Thickness: 298. Progression has improved. Findings include intraretinal hyper-reflective material, no SRF, outer retinal atrophy, abnormal foveal contour, intraretinal fluid (Interval improvement in IRF/IRHM and cystic changes).   Left Eye Quality was good. Central Foveal Thickness: 367. Progression has improved. Findings include abnormal foveal contour, intraretinal hyper-reflective material, vitreomacular adhesion , epiretinal membrane, no SRF, macular pucker, intraretinal fluid (ERM with interval improvement in IRF and cystic changes temporal mac).   Notes **Images taken, stored on drive**  Diagnosis / Impression:  DME OU (OD>OS) OD: Interval improvement in IRF/IRHM and cystic changes OS: ERM with interval improvement in IRF and cystic changes temporal mac  Clinical management:  See below  Abbreviations: NFP - Normal foveal profile. CME - cystoid macular edema. PED - pigment epithelial detachment. IRF - intraretinal fluid. SRF - subretinal fluid. EZ - ellipsoid zone. ERM - epiretinal membrane. ORA - outer retinal atrophy. ORT - outer retinal tubulation. SRHM - subretinal hyper-reflective material       Intravitreal Injection, Pharmacologic Agent - OD - Right Eye       Time Out 10/02/2021. 2:28 PM. Confirmed correct patient, procedure, site, and patient consented.   Anesthesia Topical anesthesia was used. Anesthetic medications included Proparacaine 0.5%, Lidocaine 2%.   Procedure Preparation included 5% betadine to ocular  surface, eyelid speculum. A (32g) needle was used.   Injection: 2 mg aflibercept 2 MG/0.05ML   Route: Intravitreal, Site: Right Eye   NDC: [id number], Lot: [id number], Expiration date: 08/10/2022, Waste: 0.05 mL   Post-op Post injection exam found visual acuity of at least counting fingers. The patient tolerated the procedure well. There were no complications. The patient received written and verbal post procedure care education. Post injection medications were not given.            ASSESSMENT/PLAN:   ICD-10-CM   1. 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 Posterior vitreous detachment of right eye  H43.811     3. Epiretinal membrane (ERM) of left eye  H35.372     4. Essential hypertension  I10     5. Hypertensive retinopathy of both eyes  H35.033     6. Pseudophakia of both eyes  Z96.1      1. Moderate nonproliferative diabetic retinopathy, both eyes  - Diabetic macular edema, both eyes -- relatively mild  - S/P IVA OD #1 (12.17.18), #2 (01.28.19), #3 (02.27.19), #4 (04.03.19), #5 (05.01.19), #6 (05.27.20), #7 (07.08.20)  - in reviewing her case, there was minimal response to IVA in OCT or BCVA  - S/P IVTA OD #1 (05.29.19), #2 (07.24.19), #3 (09.04.19), #4 (12.04.19), #5 (02.26.20), # 6 (08.05.20), # 7(09.30.20), #8 (12.23.20)  - s/p IVE OD #1 (03.03.21), #2 (03.31.21), #3 (04.28.21), #4 (05.28.21), #5 (08.04.21), #6 (09.08.21), #7 (10.06.21), #8 (12.15.21), #9 (02.09.22), #10 (05.11.22), #11 (8.24.22), #12 (10.05.22), #13 (12.14.22)  - s/p IVE OS #1 (04.28.21), #2 (05.28.21), #3 (12.15.21), #4 (02.09.22)  - S/P focal laser OS (09.02.20)  - s/p focal laser OD (11.17.21)  - OCT shows OD: Interval improvement in IRF/IRHM and cystic changes; OS: ERM  with interval improvement  in IRF and cystic changes temporal mac at 10 weeks  - BCVA 20/20 OD (improved) 20/20 OS  - recommend IVE OD #14 today, 02.22.23 with extension to 12 weeks  - pt wishes to proceed  - RBA of procedure discussed, questions answered - informed consent obtained and signed - see procedure note  - cont Bromfenac BID OU  - continue PF BID OU  - f/u 12 weeks -- DFE/OCT possible injection(s)  2. PVD / vitreous syneresis OD  - Discussed findings and prognosis  - No RT or RD on 360 scleral depressed exam  - Reviewed s/s of RT/RD  - Strict return precautions for any such RT/RD signs/symptoms   3. Epiretinal membrane, left eye  - mild ERM with VMA/VMT component - BCVA remains 20/20 - still asymptomatic, no metamorphopsia - no indication for surgery at this time - monitor for now  4,5. Hypertensive retinopathy OU  - discussed importance of tight BP control  - monitor  6. Pseudophakia OU  - s/p CE/IOL OU (Dr. Marisa Hua, [place])  - beautiful surgeries, doing well  - monitor  Ophthalmic Meds Ordered this visit:  Meds ordered this encounter  Medications   aflibercept (EYLEA) SOLN 2 mg     Return in about 12 weeks (around 12/25/2021) for f/u NPDR OU, DFE, OCT.  There are no Patient Instructions on file for this visit.  This document serves as a record of services personally performed by Gardiner Sleeper, MD, PhD. It was created on their behalf by Orvan Falconer, an ophthalmic technician. The creation of this record is the provider's dictation and/or activities during the visit.    Electronically signed by: Orvan Falconer, OA, 10/03/21  10:46 PM  Gardiner Sleeper, M.D., Ph.D. Diseases & Surgery of the Retina and Vitreous [facility]  I have reviewed the above documentation for accuracy and completeness, and I agree with the above. Gardiner Sleeper, M.D., Ph.D. 07/25/21 10:46 PM   Abbreviations: M myopia (nearsighted); A  astigmatism; H hyperopia (farsighted); P presbyopia; Mrx spectacle prescription;  CTL contact lenses; OD right eye; OS left eye; OU both eyes  XT exotropia; ET esotropia; PEK punctate epithelial keratitis; PEE punctate epithelial erosions; DES dry eye syndrome; MGD meibomian gland dysfunction; ATs artificial tears; PFAT's preservative free artificial tears; [place] nuclear sclerotic cataract; PSC posterior subcapsular cataract; ERM epi-retinal membrane; PVD posterior vitreous detachment; RD retinal detachment; DM diabetes mellitus; DR diabetic retinopathy; NPDR non-proliferative diabetic retinopathy; PDR proliferative diabetic retinopathy; CSME clinically significant macular edema; DME diabetic macular edema; dbh dot blot hemorrhages; CWS cotton wool spot; POAG primary open angle glaucoma; C/D cup-to-disc ratio; HVF humphrey visual field; GVF goldmann visual field; OCT optical coherence tomography; IOP intraocular pressure; BRVO Branch retinal vein occlusion; CRVO central retinal vein occlusion; CRAO central retinal artery occlusion; BRAO branch retinal artery occlusion; RT retinal tear; SB scleral buckle; PPV pars plana vitrectomy; VH Vitreous hemorrhage; PRP panretinal laser photocoagulation; IVK intravitreal kenalog; VMT vitreomacular traction; MH Macular hole;  NVD neovascularization of the disc; NVE neovascularization elsewhere; AREDS age related eye disease study; ARMD age related macular degeneration; POAG primary open angle glaucoma; EBMD epithelial/anterior basement membrane dystrophy; ACIOL anterior chamber intraocular lens; IOL intraocular lens; PCIOL posterior chamber intraocular lens; Phaco/IOL phacoemulsification with intraocular lens placement; [place] photorefractive keratectomy; LASIK laser assisted in situ keratomileusis; HTN hypertension; DM diabetes mellitus; COPD chronic obstructive pulmonary disease

## 2021-10-02 ENCOUNTER — Encounter (INDEPENDENT_AMBULATORY_CARE_PROVIDER_SITE_OTHER): Payer: Self-pay | Admitting: Ophthalmology

## 2021-10-02 ENCOUNTER — Ambulatory Visit (INDEPENDENT_AMBULATORY_CARE_PROVIDER_SITE_OTHER): Payer: PPO | Admitting: Ophthalmology

## 2021-10-02 ENCOUNTER — Other Ambulatory Visit: Payer: Self-pay

## 2021-10-02 DIAGNOSIS — H35033 Hypertensive retinopathy, bilateral: Secondary | ICD-10-CM

## 2021-10-02 DIAGNOSIS — H43811 Vitreous degeneration, right eye: Secondary | ICD-10-CM | POA: Diagnosis not present

## 2021-10-02 DIAGNOSIS — Z961 Presence of intraocular lens: Secondary | ICD-10-CM

## 2021-10-02 DIAGNOSIS — H35372 Puckering of macula, left eye: Secondary | ICD-10-CM | POA: Diagnosis not present

## 2021-10-02 DIAGNOSIS — E113313 Type 2 diabetes mellitus with moderate nonproliferative diabetic retinopathy with macular edema, bilateral: Secondary | ICD-10-CM | POA: Diagnosis not present

## 2021-10-02 DIAGNOSIS — I10 Essential (primary) hypertension: Secondary | ICD-10-CM | POA: Diagnosis not present

## 2021-10-03 ENCOUNTER — Encounter (INDEPENDENT_AMBULATORY_CARE_PROVIDER_SITE_OTHER): Payer: Self-pay | Admitting: Ophthalmology

## 2021-10-03 MED ORDER — AFLIBERCEPT 2MG/0.05ML IZ SOLN FOR KALEIDOSCOPE
2.0000 mg | INTRAVITREAL | Status: AC | PRN
  Administered 2021-10-03: 2 mg via INTRAVITREAL

## 2021-10-08 DIAGNOSIS — E119 Type 2 diabetes mellitus without complications: Secondary | ICD-10-CM

## 2021-10-08 DIAGNOSIS — E782 Mixed hyperlipidemia: Secondary | ICD-10-CM

## 2021-10-08 DIAGNOSIS — I10 Essential (primary) hypertension: Secondary | ICD-10-CM

## 2021-10-14 DIAGNOSIS — E1165 Type 2 diabetes mellitus with hyperglycemia: Secondary | ICD-10-CM | POA: Diagnosis not present

## 2021-10-14 DIAGNOSIS — Z79899 Other long term (current) drug therapy: Secondary | ICD-10-CM | POA: Diagnosis not present

## 2021-10-14 DIAGNOSIS — E119 Type 2 diabetes mellitus without complications: Secondary | ICD-10-CM | POA: Diagnosis not present

## 2021-10-14 DIAGNOSIS — I10 Essential (primary) hypertension: Secondary | ICD-10-CM | POA: Diagnosis not present

## 2021-10-14 DIAGNOSIS — E7849 Other hyperlipidemia: Secondary | ICD-10-CM | POA: Diagnosis not present

## 2021-10-21 ENCOUNTER — Encounter (INDEPENDENT_AMBULATORY_CARE_PROVIDER_SITE_OTHER): Payer: PPO | Admitting: Ophthalmology

## 2021-10-30 ENCOUNTER — Ambulatory Visit: Payer: PPO | Admitting: Nurse Practitioner

## 2021-10-30 ENCOUNTER — Ambulatory Visit: Payer: PPO

## 2021-12-09 NOTE — Progress Notes (Signed)
?[facility] Note ? ?12/18/2021 ? ?  ? ?CHIEF COMPLAINT ?Patient presents for Retina Follow Up ? ? ? ?HISTORY OF PRESENT ILLNESS: ?Carolyn Hunter is a 71 y.o. female who presents to the clinic today for:  ? ?HPI   ? ? Retina Follow Up   ?Patient presents with  Diabetic Retinopathy.  In both eyes.  This started 1 week ago.  I, the attending physician,  performed the HPI with the patient and updated documentation appropriately. ? ?  ?  ? ? Comments   ?Patient here for 10 week retina follow up for NPDR OU. Patient states vision doing good. No eye pain.  ? ?  ?  ?Last edited by Bernarda Caffey, MD on 12/18/2021  8:25 PM.  ?  ?Pt states vision is stable ? ?Referring physician: ?No referring provider defined for this encounter. ? ?HISTORICAL INFORzMATION:  ? ?Selected notes from the [place] ?Referral from Dr. Jeb Levering for DM exam  ? ?CURRENT MEDICATIONS: ?Current Outpatient Medications (Ophthalmic Drugs)  ?Medication Sig  ? Aflibercept (EYLEA IO) Inject 1 Dose into the eye every 30 (thirty) days. (Patient not taking: Reported on 12/18/2021)  ? ?No current facility-administered medications for this visit. (Ophthalmic Drugs)  ? ?Current Outpatient Medications (Other)  ?Medication Sig  ? amLODipine (NORVASC) 5 MG tablet TAKE 1 TABLET BY MOUTH EVERY DAY  ? Cholecalciferol (VITAMIN D3) 50 MCG (2000 UT) TABS Take 2,000 Units by mouth daily.  ? Liniments (SALONPAS PAIN RELIEF PATCH EX) Place 1 patch onto the skin daily as needed (pain.).  ? metFORMIN (GLUCOPHAGE) 1000 MG tablet TAKE 1 TABLET BY MOUTH EVERY DAY WITH BREAKFAST  ? propranolol ER (INDERAL LA) 60 MG 24 hr capsule TAKE 1 CAPSULE BY MOUTH EVERY DAY  ? rosuvastatin (CRESTOR) 40 MG tablet Take 1 tablet (40 mg total) by mouth daily.  ? ?No current facility-administered medications for this visit. (Other)  ? ?REVIEW OF SYSTEMS: ?ROS   ?Positive for: Neurological, Endocrine, Eyes ?Negative for: Constitutional, Gastrointestinal, Skin,  Genitourinary, Musculoskeletal, HENT, Cardiovascular, Respiratory, Psychiatric, Allergic/Imm, Heme/Lymph ?Last edited by Theodore Demark, COA on 12/18/2021  1:57 PM.  ?  ? ?ALLERGIES ?Allergies  ?Allergen Reactions  ? Ace Inhibitors Cough  ? Zetia [Ezetimibe] Swelling  ?  Sore throat  ? ?PAST MEDICAL HISTORY ?Past Medical History:  ?Diagnosis Date  ? Anxiety   ? Cataract   ? OS  ? Diabetes mellitus without complication ([place])   ? Diabetic retinopathy ([place])   ? NPDR OU  ? Hyperlipidemia   ? Hypertension   ? Hypertensive retinopathy   ? OU  ? Tremors of nervous system   ? "just when I get in a nervous Situation".  ? ?Past Surgical History:  ?Procedure Laterality Date  ? CATARACT EXTRACTION W/PHACO Right 10/18/2018  ? Procedure: CATARACT EXTRACTION PHACO AND INTRAOCULAR LENS PLACEMENT (IOC);  Surgeon: Baruch Goldmann, MD;  Location: [facility];  Service: Ophthalmology;  Laterality: Right;  CDE: 8.11  ? CATARACT EXTRACTION W/PHACO Left 01/17/2019  ? Procedure: CATARACT EXTRACTION PHACO AND INTRAOCULAR LENS PLACEMENT (IOC);  Surgeon: Baruch Goldmann, MD;  Location: [facility];  Service: Ophthalmology;  Laterality: Left;  CDE: 6.38  ? CESAREAN SECTION    ? 1988  ? COLONOSCOPY WITH PROPOFOL N/A 05/04/2020  ? Procedure: COLONOSCOPY WITH PROPOFOL;  Surgeon: Harvel Quale, MD;  [facility];  Service: Gastroenterology;  Laterality: N/A;  830  ? EYE SURGERY Right   ?  Cataract extraction OD  ? POLYPECTOMY  05/04/2020  ? Procedure: POLYPECTOMY;  Surgeon: Harvel Quale, MD;  [facility];  Service: Gastroenterology;;  ? ?FAMILY HISTORY ?Family History  ?Problem Relation Age of Onset  ? Cancer Mother   ?     breast and colon cancer  ? Breast cancer Mother   ? Diabetes Brother   ? Diabetes Son   ? Asthma Father   ?     COPD  ? Diabetes Son   ? ?SOCIAL HISTORY ?Social History  ? ?Tobacco Use  ? Smoking status: Never  ? Smokeless tobacco: Never  ?Vaping Use  ? Vaping Use: Never used  ?Substance Use  Topics  ? Alcohol use: No  ? Drug use: No  ?  ? ?  ?OPHTHALMIC EXAM: ? ?Base Eye Exam   ? ? Visual Acuity (Snellen - Linear)   ? ?   Right Left  ? Dist [place] 20/25 -1 20/20  ? Dist ph [place] 20/20   ? ?  ?  ? ? Tonometry (Tonopen, 1:55 PM)   ? ?   Right Left  ? Pressure 17 14  ? ?  ?  ? ? Pupils   ? ?   Dark Light Shape React APD  ? Right 3 2 Round Brisk None  ? Left 3 2 Round Brisk None  ? ?  ?  ? ? Visual Fields (Counting fingers)   ? ?   Left Right  ?  Full Full  ? ?  ?  ? ? Extraocular Movement   ? ?   Right Left  ?  Full, Ortho Full, Ortho  ? ?  ?  ? ? Neuro/Psych   ? ? Oriented x3: Yes  ? Mood/Affect: Normal  ? ?  ?  ? ? Dilation   ? ? Both eyes: 1.0% Mydriacyl, 2.5% Phenylephrine @ 1:55 PM  ? ?  ?  ? ?  ? ?Slit Lamp and Fundus Exam   ? ? Slit Lamp Exam   ? ?   Right Left  ? Lids/Lashes Dermatochalasis - upper lid, Meibomian gland dysfunction Dermatochalasis - upper lid, Meibomian gland dysfunction  ? Conjunctiva/Sclera White and quiet White and quiet  ? Cornea Mild Arcus, Well healed temporal cataract wounds, trace Punctate epithelial erosions Mild Arcus, EBMD, trace Punctate epithelial erosions, Well healed temporal cataract wounds, mild Debris in tear film  ? Anterior Chamber Deep, 1+cell/pigment Deep and quiet  ? Iris Round and dilated, No NVI, PPM nasally Round and dilated, No NVI  ? Lens PC IOL in good position PC IOL in good position, trace PCO  ? Anterior Vitreous Vitreous syneresis, mild vitreous condensations, Posterior vitreous detachment, old, white VH settled inferiorly Vitreous syneresis  ? ?  ?  ? ? Fundus Exam   ? ?   Right Left  ? Disc mild pallor, Sharp rim mild pallor, Sharp rim  ? C/D Ratio 0.4 0.4  ? Macula Flat, good foveal reflex, interval improvement in central edema, no heme, exudates or cystic changes, ERM temporal macula Flat, blunted foveal reflex, epiretinal membrane, punctate exudates and edema temporal macula, minimal MA  ? Vessels attenuated, Tortuous attenuated, Tortuous  ? Periphery  Attached, scattered MAs, DBH, exudates inferior to disc - resolved, inferior retinal partially obscured by vitreous condensations / old VH attached, scattered DBH greatest nasally and IN, focal exudates nasal midzone - improved  ? ?  ?  ? ?  ? ?IMAGING AND PROCEDURES  ?Imaging  and Procedures for 12/10/17 ? ?OCT, Retina - OU - Both Eyes   ? ?   ?Right Eye ?Quality was good. Central Foveal Thickness: 295. Progression has improved. Findings include intraretinal hyper-reflective material, no SRF, outer retinal atrophy, abnormal foveal contour, no IRF (Interval improvement in IRF/IRHM -- just trace cystic changes).  ? ?Left Eye ?Quality was good. Central Foveal Thickness: 361. Progression has worsened. Findings include abnormal foveal contour, intraretinal hyper-reflective material, vitreomacular adhesion , epiretinal membrane, no SRF, macular pucker, intraretinal fluid (ERM; mild interval increase in IRF and cystic changes temporal mac).  ? ?Notes ?**Images taken, stored on drive** ? ?Diagnosis / Impression:  ?DME OU (OD>OS) ?OD: Interval improvement in IRF/IRHM -- just trace cystic changes remain ?OS: ERM; mild interval increase in IRF and cystic changes temporal mac ? ?Clinical management:  ?See below ? ?Abbreviations: NFP - Normal foveal profile. CME - cystoid macular edema. PED - pigment epithelial detachment. IRF - intraretinal fluid. SRF - subretinal fluid. EZ - ellipsoid zone. ERM - epiretinal membrane. ORA - outer retinal atrophy. ORT - outer retinal tubulation. SRHM - subretinal hyper-reflective material  ? ? ?  ? ?Intravitreal Injection, Pharmacologic Agent - OS - Left Eye   ? ?   ?Time Out ?12/18/2021. 2:42 PM. Confirmed correct patient, procedure, site, and patient consented.  ? ?Anesthesia ?Topical anesthesia was used. Anesthetic medications included Lidocaine 2%, Proparacaine 0.5%.  ? ?Procedure ?Preparation included eyelid speculum, 5% betadine to ocular surface. A (32g) needle was used.  ? ?Injection: ?2  mg aflibercept 2 MG/0.05ML ?  Route: Intravitreal, Site: Left Eye ?  [place]: [id number], Lot: [id number], Expiration date: 11/09/2022, Waste: 0 mL  ? ?Post-op ?Post injection exam found visual acuity of at lea

## 2021-12-18 ENCOUNTER — Encounter (INDEPENDENT_AMBULATORY_CARE_PROVIDER_SITE_OTHER): Payer: Self-pay | Admitting: Ophthalmology

## 2021-12-18 ENCOUNTER — Ambulatory Visit (INDEPENDENT_AMBULATORY_CARE_PROVIDER_SITE_OTHER): Payer: PPO | Admitting: Ophthalmology

## 2021-12-18 DIAGNOSIS — I10 Essential (primary) hypertension: Secondary | ICD-10-CM

## 2021-12-18 DIAGNOSIS — H35372 Puckering of macula, left eye: Secondary | ICD-10-CM

## 2021-12-18 DIAGNOSIS — E113313 Type 2 diabetes mellitus with moderate nonproliferative diabetic retinopathy with macular edema, bilateral: Secondary | ICD-10-CM

## 2021-12-18 DIAGNOSIS — H43811 Vitreous degeneration, right eye: Secondary | ICD-10-CM | POA: Diagnosis not present

## 2021-12-18 DIAGNOSIS — Z961 Presence of intraocular lens: Secondary | ICD-10-CM

## 2021-12-18 DIAGNOSIS — H35033 Hypertensive retinopathy, bilateral: Secondary | ICD-10-CM | POA: Diagnosis not present

## 2021-12-18 DIAGNOSIS — H25812 Combined forms of age-related cataract, left eye: Secondary | ICD-10-CM | POA: Diagnosis not present

## 2021-12-18 MED ORDER — AFLIBERCEPT 2MG/0.05ML IZ SOLN FOR KALEIDOSCOPE
2.0000 mg | INTRAVITREAL | Status: AC | PRN
  Administered 2021-12-18: 2 mg via INTRAVITREAL

## 2021-12-31 ENCOUNTER — Other Ambulatory Visit: Payer: Self-pay | Admitting: Family Medicine

## 2021-12-31 DIAGNOSIS — E119 Type 2 diabetes mellitus without complications: Secondary | ICD-10-CM

## 2022-01-20 DIAGNOSIS — E1143 Type 2 diabetes mellitus with diabetic autonomic (poly)neuropathy: Secondary | ICD-10-CM | POA: Diagnosis not present

## 2022-01-20 DIAGNOSIS — Z6824 Body mass index (BMI) 24.0-24.9, adult: Secondary | ICD-10-CM | POA: Diagnosis not present

## 2022-01-20 DIAGNOSIS — Z0000 Encounter for general adult medical examination without abnormal findings: Secondary | ICD-10-CM | POA: Diagnosis not present

## 2022-01-20 DIAGNOSIS — I10 Essential (primary) hypertension: Secondary | ICD-10-CM | POA: Diagnosis not present

## 2022-01-20 DIAGNOSIS — N1832 Chronic kidney disease, stage 3b: Secondary | ICD-10-CM | POA: Diagnosis not present

## 2022-01-20 DIAGNOSIS — E7849 Other hyperlipidemia: Secondary | ICD-10-CM | POA: Diagnosis not present

## 2022-02-21 NOTE — Progress Notes (Signed)
This encounter was created in error - please disregard.

## 2022-02-27 NOTE — Progress Notes (Signed)
[facility] Note  03/05/2022     CHIEF COMPLAINT Patient presents for Retina Follow Up    HISTORY OF PRESENT ILLNESS: Carolyn Hunter is a 71 y.o. female who presents to the clinic today for:   HPI     Retina Follow Up   Patient presents with  Diabetic Retinopathy.  In both eyes.  This started 1 week ago.  I, the attending physician,  performed the HPI with the patient and updated documentation appropriately.        Comments   Patient here for 1 weeks retina follow up for NPDR OU. Patient states vision doing ok. No eye pain.       Last edited by Bernarda Caffey, MD on 03/05/2022  5:07 PM.     Referring physician: Noreene Larsson, NP [address] [place],  [place] [address]  HISTORICAL INFORzMATION:   Selected notes from the MEDICAL RECORD NUMBER Referral from Dr. Jeb Levering for DM exam   CURRENT MEDICATIONS: Current Outpatient Medications (Ophthalmic Drugs)  Medication Sig   Aflibercept (EYLEA IO) Inject 1 Dose into the eye every 30 (thirty) days. (Patient not taking: Reported on 12/18/2021)   No current facility-administered medications for this visit. (Ophthalmic Drugs)   Current Outpatient Medications (Other)  Medication Sig   amLODipine (NORVASC) 5 MG tablet TAKE 1 TABLET BY MOUTH EVERY DAY   Cholecalciferol (VITAMIN D3) 50 MCG (2000 UT) TABS Take 2,000 Units by mouth daily.   Liniments (SALONPAS PAIN RELIEF PATCH EX) Place 1 patch onto the skin daily as needed (pain.).   metFORMIN (GLUCOPHAGE) 1000 MG tablet TAKE 1 TABLET BY MOUTH EVERY DAY WITH BREAKFAST   propranolol ER (INDERAL LA) 60 MG 24 hr capsule TAKE 1 CAPSULE BY MOUTH EVERY DAY   rosuvastatin (CRESTOR) 40 MG tablet Take 1 tablet (40 mg total) by mouth daily.   No current facility-administered medications for this visit. (Other)   REVIEW OF SYSTEMS: ROS   Positive for: Neurological, Endocrine, Eyes Negative for: Constitutional, Gastrointestinal, Skin, Genitourinary,  Musculoskeletal, HENT, Cardiovascular, Respiratory, Psychiatric, Allergic/Imm, Heme/Lymph Last edited by Theodore Demark, COA on 03/05/2022  1:43 PM.     ALLERGIES Allergies  Allergen Reactions   Ace Inhibitors Cough   Zetia [Ezetimibe] Swelling    Sore throat   PAST MEDICAL HISTORY Past Medical History:  Diagnosis Date   Anxiety    Cataract    OS   Diabetes mellitus without complication ([place])    Diabetic retinopathy ([place])    NPDR OU   Hyperlipidemia    Hypertension    Hypertensive retinopathy    OU   Tremors of nervous system    "just when I get in a nervous Situation".   Past Surgical History:  Procedure Laterality Date   CATARACT EXTRACTION W/PHACO Right 10/18/2018   Procedure: CATARACT EXTRACTION PHACO AND INTRAOCULAR LENS PLACEMENT (IOC);  Surgeon: Baruch Goldmann, MD;  Location: [facility];  Service: Ophthalmology;  Laterality: Right;  CDE: 8.11   CATARACT EXTRACTION W/PHACO Left 01/17/2019   Procedure: CATARACT EXTRACTION PHACO AND INTRAOCULAR LENS PLACEMENT (IOC);  Surgeon: Baruch Goldmann, MD;  Location: [facility];  Service: Ophthalmology;  Laterality: Left;  CDE: 6.38   CESAREAN SECTION     1988   COLONOSCOPY WITH PROPOFOL N/A 05/04/2020   Procedure: COLONOSCOPY WITH PROPOFOL;  Surgeon: Harvel Quale, MD;  Location: [facility];  Service: Gastroenterology;  Laterality: N/A;  830   EYE SURGERY Right    Cataract  extraction OD   POLYPECTOMY  05/04/2020   Procedure: POLYPECTOMY;  Surgeon: Montez Morita, Quillian Quince, MD;  Location: [facility];  Service: Gastroenterology;;   FAMILY HISTORY Family History  Problem Relation Age of Onset   Cancer Mother        breast and colon cancer   Breast cancer Mother    Diabetes Brother    Diabetes Son    Asthma Father        COPD   Diabetes Son    SOCIAL HISTORY Social History   Tobacco Use   Smoking status: Never   Smokeless tobacco: Never  Vaping Use   Vaping Use: Never used  Substance Use Topics   Alcohol  use: No   Drug use: No       OPHTHALMIC EXAM:  Base Eye Exam     Visual Acuity (Snellen - Linear)       Right Left   Dist [place] 20/30 20/20 -1   Dist ph [place] 20/25          Tonometry (Tonopen, 1:41 PM)       Right Left   Pressure 17 15         Pupils       Dark Light Shape React APD   Right 3 2 Round Brisk None   Left 3 2 Round Brisk None         Visual Fields (Counting fingers)       Left Right    Full Full         Extraocular Movement       Right Left    Full, Ortho Full, Ortho         Neuro/Psych     Oriented x3: Yes   Mood/Affect: Normal         Dilation     Both eyes: 1.0% Mydriacyl, 2.5% Phenylephrine @ 1:41 PM           Slit Lamp and Fundus Exam     Slit Lamp Exam       Right Left   Lids/Lashes Dermatochalasis - upper lid, Meibomian gland dysfunction Dermatochalasis - upper lid, Meibomian gland dysfunction   Conjunctiva/Sclera White and quiet White and quiet   Cornea Mild Arcus, Well healed temporal cataract wounds, trace Punctate epithelial erosions Mild Arcus, EBMD, trace Punctate epithelial erosions, Well healed temporal cataract wounds, mild Debris in tear film   Anterior Chamber Deep, 1+cell/pigment Deep and quiet   Iris Round and dilated, No NVI, PPM nasally Round and dilated, No NVI   Lens PC IOL in good position PC IOL in good position, trace PCO   Anterior Vitreous Vitreous syneresis, mild vitreous condensations, Posterior vitreous detachment, old, white VH settled inferiorly Vitreous syneresis         Fundus Exam       Right Left   Disc mild pallor, Sharp rim mild pallor, Sharp rim   C/D Ratio 0.4 0.4   Macula Flat, good foveal reflex, interval increase in central edema, focal MA temporal mac, exudates or cystic changes, ERM temporal macula Flat, good foveal reflex, epiretinal membrane, punctate exudates and edema temporal macula, minimal MA   Vessels attenuated, Tortuous attenuated, Tortuous   Periphery Attached,  scattered MAs, scattered MA's, inferior retinal partially obscured by vitreous condensations / old VH attached, scattered DBH greatest nasally and IN, focal exudates nasal midzone - improved           IMAGING AND PROCEDURES  Imaging and Procedures for 12/10/17  OCT, Retina - OU - Both Eyes       Right Eye Quality was good. Central Foveal Thickness: 383. Progression has worsened. Findings include no SRF, abnormal foveal contour, intraretinal hyper-reflective material, outer retinal atrophy (Interval redevelopment in central IRF/IRHM ).   Left Eye Quality was good. Central Foveal Thickness: 349. Progression has improved. Findings include no SRF, abnormal foveal contour, intraretinal hyper-reflective material, epiretinal membrane, intraretinal fluid, macular pucker, vitreomacular adhesion (ERM; mild interval improvement in IRF and cystic changes temporal mac).   Notes **Images taken, stored on drive**  Diagnosis / Impression:  DME OU (OD>OS) OD: Interval redevelopment in central IRF/IRHM  OS: ERM; mild interval improvment in IRF and cystic changes temporal mac  Clinical management:  See below  Abbreviations: NFP - Normal foveal profile. CME - cystoid macular edema. PED - pigment epithelial detachment. IRF - intraretinal fluid. SRF - subretinal fluid. EZ - ellipsoid zone. ERM - epiretinal membrane. ORA - outer retinal atrophy. ORT - outer retinal tubulation. SRHM - subretinal hyper-reflective material       Intravitreal Injection, Pharmacologic Agent - OD - Right Eye       Time Out 03/05/2022. 2:38 PM. Confirmed correct patient, procedure, site, and patient consented.   Anesthesia Topical anesthesia was used. Anesthetic medications included Lidocaine 2%, Proparacaine 0.5%.   Procedure Preparation included 5% betadine to ocular surface, eyelid speculum. A (32g) needle was used.   Injection: 2 mg aflibercept 2 MG/0.05ML   Route: Intravitreal, Site: Right Eye   NDC:  [id number], Lot: [id number], Expiration date: 12/09/2022, Waste: 0 mL   Post-op Post injection exam found visual acuity of at least counting fingers. The patient tolerated the procedure well. There were no complications. The patient received written and verbal post procedure care education. Post injection medications were not given.      Intravitreal Injection, Pharmacologic Agent - OS - Left Eye       Time Out 03/05/2022. 2:38 PM. Confirmed correct patient, procedure, site, and patient consented.   Anesthesia Topical anesthesia was used. Anesthetic medications included Lidocaine 2%, Proparacaine 0.5%.   Procedure Preparation included 5% betadine to ocular surface, eyelid speculum. A (32g) needle was used.   Injection: 2 mg aflibercept 2 MG/0.05ML   Route: Intravitreal, Site: Left Eye   NDC: [id number], Lot: [id number], Expiration date: 09/10/2022, Waste: 0 mL   Post-op Post injection exam found visual acuity of at least counting fingers. The patient tolerated the procedure well. There were no complications. The patient received written and verbal post procedure care education. Post injection medications were not given.            ASSESSMENT/PLAN:   ICD-10-CM   1. 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 Posterior vitreous detachment of right eye  H43.811     3. Essential hypertension  I10     4. Hypertensive retinopathy of both eyes  H35.033     5. Epiretinal membrane (ERM) of left eye  H35.372     6. Pseudophakia of both eyes  Z96.1      1. Moderate nonproliferative diabetic retinopathy, both eyes  - Diabetic macular edema, both eyes --  relatively mild  - S/P IVA OD #1 (12.17.18), #2 (01.28.19), #3 (02.27.19), #4 (04.03.19), #5 (05.01.19), #6 (05.27.20), #7 (07.08.20)  - in reviewing her case, there was minimal response to IVA in OCT or BCVA  - S/P IVTA OD #1 (05.29.19), #2 (07.24.19), #3 (09.04.19), #4 (12.04.19), #5 (02.26.20), # 6 (08.05.20), # 7(09.30.20), #8 (12.23.20)  - s/p IVE OD #1 (03.03.21), #2 (03.31.21), #3 (04.28.21), #4 (05.28.21), #5 (08.04.21), #6 (09.08.21), #7 (10.06.21), #8 (12.15.21), #9 (02.09.22), #10 (05.11.22), #11 (8.24.22), #12 (10.05.22), #13 (12.14.22), #14 (02.22.23)  - s/p IVE OS #1 (04.28.21), #2 (05.28.21), #3 (12.15.21), #4 (02.09.22), #5 (05.10.23)  - S/P focal laser OS (09.02.20)  - s/p focal laser OD (11.17.21)  - OCT shows OD: Interval redevelopment in central IRF/IRHM  OS: ERM; mild interval improvement in IRF and cystic changes temporal mac at 11 weeks  - BCVA OD 20/25 (from 20/20) OS 20/20   - recommend IVE OD #15 and OS #6 today, 07.26.23 with f/u in 10 weeks  - pt wishes to proceed  - informed consent obtained and signed (07.26.23)  - RBA of procedure discussed, questions answered - see procedure note  - no longer taking Bromfenac BID OU -- ok  - no longer taking  PF BID OU -- ok  - f/u 10 weeks -- DFE/OCT possible injection(s)  2. PVD / vitreous syneresis OD  - Discussed findings and prognosis  - No RT or RD on 360 scleral depressed exam  - Reviewed s/s of RT/RD  - Strict return precautions for any such RT/RD signs/symptoms   3. Epiretinal membrane, left eye  - mild ERM with VMA/VMT component - BCVA remains 20/20 - still asymptomatic, no metamorphopsia - no indication for surgery at this time - continue to monitor  4,5. Hypertensive retinopathy OU  - discussed importance of tight BP control  -continue to  monitor  6. Pseudophakia OU  - s/p CE/IOL OU (Dr. Marisa Hua, [place])  - beautiful surgeries, doing well  - continue to monitor  Ophthalmic Meds Ordered  this visit:  Meds ordered this encounter  Medications   aflibercept (EYLEA) SOLN 2 mg   aflibercept (EYLEA) SOLN 2 mg     Return in about 10 weeks (around 05/14/2022) for MOD NPDR, DFE, OCT, Possible, IVE, OU.  There are no Patient Instructions on file for this visit.  This document serves as a record of services personally performed by Gardiner Sleeper, MD, PhD. It was created on their behalf by Orvan Falconer, an ophthalmic technician. The creation of this record is the provider's dictation and/or activities during the visit.    Electronically signed by: Orvan Falconer, OA, 03/05/22  5:09 PM  This document serves as a record of services personally performed by Gardiner Sleeper, MD, PhD. It was created on their behalf by Renaldo Reel, COT an ophthalmic technician. The creation of this record is the provider's dictation and/or activities during the visit.    Electronically signed by:  Renaldo Reel, COT  03/05/22  5:09 PM  Gardiner Sleeper, M.D., Ph.D. Diseases & Surgery of the Retina and Vitreous [facility]  I have reviewed the above documentation for accuracy and completeness, and I agree with the above. Gardiner Sleeper, M.D., Ph.D. 03/05/22 5:10 PM   Abbreviations: M myopia (nearsighted); A astigmatism; H hyperopia (farsighted); P presbyopia; Mrx spectacle prescription;  CTL contact lenses; OD right eye; OS left eye; OU both eyes  XT exotropia; ET esotropia; PEK punctate  epithelial keratitis; PEE punctate epithelial erosions; DES dry eye syndrome; MGD meibomian gland dysfunction; ATs artificial tears; PFAT's preservative free artificial tears; [place] nuclear sclerotic cataract; PSC posterior subcapsular cataract; ERM epi-retinal membrane; PVD posterior vitreous detachment; RD retinal detachment; DM diabetes mellitus; DR diabetic retinopathy; NPDR non-proliferative diabetic retinopathy; PDR proliferative diabetic retinopathy; CSME clinically significant  macular edema; DME diabetic macular edema; dbh dot blot hemorrhages; CWS cotton wool spot; POAG primary open angle glaucoma; C/D cup-to-disc ratio; HVF humphrey visual field; GVF goldmann visual field; OCT optical coherence tomography; IOP intraocular pressure; BRVO Branch retinal vein occlusion; CRVO central retinal vein occlusion; CRAO central retinal artery occlusion; BRAO branch retinal artery occlusion; RT retinal tear; SB scleral buckle; PPV pars plana vitrectomy; VH Vitreous hemorrhage; PRP panretinal laser photocoagulation; IVK intravitreal kenalog; VMT vitreomacular traction; MH Macular hole;  NVD neovascularization of the disc; NVE neovascularization elsewhere; AREDS age related eye disease study; ARMD age related macular degeneration; POAG primary open angle glaucoma; EBMD epithelial/anterior basement membrane dystrophy; ACIOL anterior chamber intraocular lens; IOL intraocular lens; PCIOL posterior chamber intraocular lens; Phaco/IOL phacoemulsification with intraocular lens placement; [place] photorefractive keratectomy; LASIK laser assisted in situ keratomileusis; HTN hypertension; DM diabetes mellitus; COPD chronic obstructive pulmonary disease

## 2022-03-05 ENCOUNTER — Encounter (INDEPENDENT_AMBULATORY_CARE_PROVIDER_SITE_OTHER): Payer: Self-pay | Admitting: Ophthalmology

## 2022-03-05 ENCOUNTER — Ambulatory Visit (INDEPENDENT_AMBULATORY_CARE_PROVIDER_SITE_OTHER): Payer: PPO | Admitting: Ophthalmology

## 2022-03-05 DIAGNOSIS — E113313 Type 2 diabetes mellitus with moderate nonproliferative diabetic retinopathy with macular edema, bilateral: Secondary | ICD-10-CM | POA: Diagnosis not present

## 2022-03-05 DIAGNOSIS — I10 Essential (primary) hypertension: Secondary | ICD-10-CM | POA: Diagnosis not present

## 2022-03-05 DIAGNOSIS — Z961 Presence of intraocular lens: Secondary | ICD-10-CM | POA: Diagnosis not present

## 2022-03-05 DIAGNOSIS — H25812 Combined forms of age-related cataract, left eye: Secondary | ICD-10-CM

## 2022-03-05 DIAGNOSIS — H35372 Puckering of macula, left eye: Secondary | ICD-10-CM | POA: Diagnosis not present

## 2022-03-05 DIAGNOSIS — H43811 Vitreous degeneration, right eye: Secondary | ICD-10-CM

## 2022-03-05 DIAGNOSIS — H35033 Hypertensive retinopathy, bilateral: Secondary | ICD-10-CM | POA: Diagnosis not present

## 2022-03-05 MED ORDER — AFLIBERCEPT 2MG/0.05ML IZ SOLN FOR KALEIDOSCOPE
2.0000 mg | INTRAVITREAL | Status: AC | PRN
  Administered 2022-03-05: 2 mg via INTRAVITREAL

## 2022-04-28 DIAGNOSIS — Z6825 Body mass index (BMI) 25.0-25.9, adult: Secondary | ICD-10-CM | POA: Diagnosis not present

## 2022-04-28 DIAGNOSIS — N1832 Chronic kidney disease, stage 3b: Secondary | ICD-10-CM | POA: Diagnosis not present

## 2022-04-28 DIAGNOSIS — Z0000 Encounter for general adult medical examination without abnormal findings: Secondary | ICD-10-CM | POA: Diagnosis not present

## 2022-04-28 DIAGNOSIS — E1143 Type 2 diabetes mellitus with diabetic autonomic (poly)neuropathy: Secondary | ICD-10-CM | POA: Diagnosis not present

## 2022-04-28 DIAGNOSIS — E7849 Other hyperlipidemia: Secondary | ICD-10-CM | POA: Diagnosis not present

## 2022-04-28 DIAGNOSIS — S43421A Sprain of right rotator cuff capsule, initial encounter: Secondary | ICD-10-CM | POA: Diagnosis not present

## 2022-04-28 DIAGNOSIS — I10 Essential (primary) hypertension: Secondary | ICD-10-CM | POA: Diagnosis not present

## 2022-05-06 DIAGNOSIS — Z1231 Encounter for screening mammogram for malignant neoplasm of breast: Secondary | ICD-10-CM | POA: Diagnosis not present

## 2022-05-12 NOTE — Progress Notes (Signed)
[facility] Note  05/14/2022     CHIEF COMPLAINT Patient presents for Retina Follow Up    HISTORY OF PRESENT ILLNESS: Carolyn Hunter is a 71 y.o. female who presents to the clinic today for:   HPI     Retina Follow Up   Patient presents with  Diabetic Retinopathy.  In both eyes.  This started months ago.  Duration of 10 weeks.  Since onset it is stable.  I, the attending physician,  performed the HPI with the patient and updated documentation appropriately.        Comments   Patient feels that the vision is the same since her last visit. Her blood sugar was 140 and her A1C is 6.6.      Last edited by Bernarda Caffey, MD on 05/14/2022  4:55 PM.     Patient states vision the same OU  Referring physician: Neale Burly, MD [place],  [place] [address]  HISTORICAL INFORzMATION:   Selected notes from the MEDICAL RECORD NUMBER Referral from Dr. Jeb Levering for DM exam   CURRENT MEDICATIONS: Current Outpatient Medications (Ophthalmic Drugs)  Medication Sig   Aflibercept (EYLEA IO) Inject 1 Dose into the eye every 30 (thirty) days. (Patient not taking: Reported on 12/18/2021)   No current facility-administered medications for this visit. (Ophthalmic Drugs)   Current Outpatient Medications (Other)  Medication Sig   amLODipine (NORVASC) 5 MG tablet TAKE 1 TABLET BY MOUTH EVERY DAY   Cholecalciferol (VITAMIN D3) 50 MCG (2000 UT) TABS Take 2,000 Units by mouth daily.   Liniments (SALONPAS PAIN RELIEF PATCH EX) Place 1 patch onto the skin daily as needed (pain.).   metFORMIN (GLUCOPHAGE) 1000 MG tablet TAKE 1 TABLET BY MOUTH EVERY DAY WITH BREAKFAST   propranolol ER (INDERAL LA) 60 MG 24 hr capsule TAKE 1 CAPSULE BY MOUTH EVERY DAY   rosuvastatin (CRESTOR) 40 MG tablet Take 1 tablet (40 mg total) by mouth daily.   No current facility-administered medications for this visit. (Other)   REVIEW OF SYSTEMS: ROS   Positive for:  Neurological, Endocrine, Eyes Negative for: Constitutional, Gastrointestinal, Skin, Genitourinary, Musculoskeletal, HENT, Cardiovascular, Respiratory, Psychiatric, Allergic/Imm, Heme/Lymph Last edited by Annie Paras, COT on 05/14/2022  1:11 PM.     ALLERGIES Allergies  Allergen Reactions   Ace Inhibitors Cough   Zetia [Ezetimibe] Swelling    Sore throat   PAST MEDICAL HISTORY Past Medical History:  Diagnosis Date   Anxiety    Cataract    OS   Diabetes mellitus without complication ([place])    Diabetic retinopathy ([place])    NPDR OU   Hyperlipidemia    Hypertension    Hypertensive retinopathy    OU   Tremors of nervous system    "just when I get in a nervous Situation".   Past Surgical History:  Procedure Laterality Date   CATARACT EXTRACTION W/PHACO Right 10/18/2018   Procedure: CATARACT EXTRACTION PHACO AND INTRAOCULAR LENS PLACEMENT (IOC);  Surgeon: Baruch Goldmann, MD;  Location: [facility];  Service: Ophthalmology;  Laterality: Right;  CDE: 8.11   CATARACT EXTRACTION W/PHACO Left 01/17/2019   Procedure: CATARACT EXTRACTION PHACO AND INTRAOCULAR LENS PLACEMENT (IOC);  Surgeon: Baruch Goldmann, MD;  Location: [facility];  Service: Ophthalmology;  Laterality: Left;  CDE: 6.38   CESAREAN SECTION     1988   COLONOSCOPY WITH PROPOFOL N/A 05/04/2020   Procedure: COLONOSCOPY WITH PROPOFOL;  Surgeon: Harvel Quale, MD;  Location:  [facility];  Service: Gastroenterology;  Laterality: N/A;  830   EYE SURGERY Right    Cataract extraction OD   POLYPECTOMY  05/04/2020   Procedure: POLYPECTOMY;  Surgeon: Montez Morita, Quillian Quince, MD;  Location: [facility];  Service: Gastroenterology;;   FAMILY HISTORY Family History  Problem Relation Age of Onset   Cancer Mother        breast and colon cancer   Breast cancer Mother    Diabetes Brother    Diabetes Son    Asthma Father        COPD   Diabetes Son    SOCIAL HISTORY Social History   Tobacco Use   Smoking status: Never    Smokeless tobacco: Never  Vaping Use   Vaping Use: Never used  Substance Use Topics   Alcohol use: No   Drug use: No       OPHTHALMIC EXAM:  Base Eye Exam     Visual Acuity (Snellen - Linear)       Right Left   Dist [place] 20/25 +2 20/20 +1         Tonometry (Tonopen, 1:14 PM)       Right Left   Pressure 10 12         Pupils       Dark Light Shape React APD   Right 3 2 Round Brisk None   Left              Visual Fields       Left Right    Full Full         Extraocular Movement       Right Left    Full, Ortho Full, Ortho         Neuro/Psych     Oriented x3: Yes   Mood/Affect: Normal         Dilation     Both eyes: 1.0% Mydriacyl, 2.5% Phenylephrine @ 1:11 PM           Slit Lamp and Fundus Exam     Slit Lamp Exam       Right Left   Lids/Lashes Dermatochalasis - upper lid, Meibomian gland dysfunction Dermatochalasis - upper lid, Meibomian gland dysfunction   Conjunctiva/Sclera White and quiet White and quiet   Cornea Mild Arcus, Well healed temporal cataract wounds, trace Punctate epithelial erosions Mild Arcus, EBMD, trace Punctate epithelial erosions, Well healed temporal cataract wounds, mild Debris in tear film   Anterior Chamber Deep, 1+cell/pigment Deep and quiet   Iris Round and dilated, No NVI, PPM nasally Round and dilated, No NVI   Lens PC IOL in good position PC IOL in good position, trace PCO   Anterior Vitreous Vitreous syneresis, mild vitreous condensations, Posterior vitreous detachment, old, white VH settled inferiorly Vitreous syneresis         Fundus Exam       Right Left   Disc mild pallor, Sharp rim mild pallor, Sharp rim   C/D Ratio 0.4 0.4   Macula Flat, good foveal reflex, interval improvement in central edema, focal MA  and exudates temporal mac, ERM temporal macula Flat, good foveal reflex, epiretinal membrane, punctate exudates and edema temporal macula--improved, minimal MA   Vessels attenuated,  Tortuous attenuated, Tortuous   Periphery Attached, scattered MAs, scattered MA's, inferior retina partially obscured by vitreous condensations / old VH attached, scattered DBH greatest nasally and IN, focal exudates nasal midzone - improved  IMAGING AND PROCEDURES  Imaging and Procedures for 12/10/17  OCT, Retina - OU - Both Eyes       Right Eye Quality was good. Central Foveal Thickness: 295. Progression has improved. Findings include no SRF, abnormal foveal contour, intraretinal hyper-reflective material, intraretinal fluid, outer retinal atrophy (Interval improvement in central IRF/IRHM---just trace cystic changes remain IT macula).   Left Eye Quality was good. Central Foveal Thickness: 346. Progression has improved. Findings include no SRF, abnormal foveal contour, intraretinal hyper-reflective material, epiretinal membrane, intraretinal fluid, macular pucker, vitreomacular adhesion (ERM; mild interval improvement in IRF and cystic changes temporal mac).   Notes **Images taken, stored on drive**  Diagnosis / Impression:  DME OU (OD>OS) OD: Interval improvement in central IRF/IRHM---just trace cystic changes remain IT macula OS: ERM; mild interval improvment in IRF and cystic changes temporal mac  Clinical management:  See below  Abbreviations: NFP - Normal foveal profile. CME - cystoid macular edema. PED - pigment epithelial detachment. IRF - intraretinal fluid. SRF - subretinal fluid. EZ - ellipsoid zone. ERM - epiretinal membrane. ORA - outer retinal atrophy. ORT - outer retinal tubulation. SRHM - subretinal hyper-reflective material       Intravitreal Injection, Pharmacologic Agent - OS - Left Eye       Time Out 05/14/2022. 1:57 PM. Confirmed correct patient, procedure, site, and patient consented.   Anesthesia Topical anesthesia was used. Anesthetic medications included Lidocaine 2%, Proparacaine 0.5%.   Procedure Preparation included 5% betadine to  ocular surface, eyelid speculum. A (32g) needle was used.   Injection: 2 mg aflibercept 2 MG/0.05ML   Route: Intravitreal, Site: Left Eye   NDC: A3590391, Lot: [id number], Expiration date: 06/11/2023, Waste: 0 mL   Post-op Post injection exam found visual acuity of at least counting fingers. The patient tolerated the procedure well. There were no complications. The patient received written and verbal post procedure care education. Post injection medications were not given.      Intravitreal Injection, Pharmacologic Agent - OD - Right Eye       Time Out 05/14/2022. 2:03 PM. Confirmed correct patient, procedure, site, and patient consented.   Anesthesia Topical anesthesia was used. Anesthetic medications included Lidocaine 2%, Proparacaine 0.5%.   Procedure Preparation included 5% betadine to ocular surface, eyelid speculum. A (32g) needle was used.   Injection: 2 mg aflibercept 2 MG/0.05ML   Route: Intravitreal, Site: Right Eye   NDC: A3590391, Lot: [id number], Expiration date: 01/08/2023, Waste: 0 mL   Post-op Post injection exam found visual acuity of at least counting fingers. The patient tolerated the procedure well. There were no complications. The patient received written and verbal post procedure care education. Post injection medications were not given.            ASSESSMENT/PLAN:   ICD-10-CM   1. 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 Posterior vitreous detachment of right eye  H43.811     3. Essential hypertension  I10     4. Hypertensive retinopathy of both eyes  H35.033     5. Epiretinal membrane (ERM) of left eye  H35.372      6. Pseudophakia of  both eyes  Z96.1       1. Moderate nonproliferative diabetic retinopathy, both eyes  - Diabetic macular edema, both eyes -- relatively mild  - S/P IVA OD #1 (12.17.18), #2 (01.28.19), #3 (02.27.19), #4 (04.03.19), #5 (05.01.19), #6 (05.27.20), #7 (07.08.20)  - in reviewing her case, there was minimal response to IVA in OCT or BCVA  - S/P IVTA OD #1 (05.29.19), #2 (07.24.19), #3 (09.04.19), #4 (12.04.19), #5 (02.26.20), # 6 (08.05.20), # 7(09.30.20), #8 (12.23.20)  - s/p IVE OD #1 (03.03.21), #2 (03.31.21), #3 (04.28.21), #4 (05.28.21), #5 (08.04.21), #6 (09.08.21), #7 (10.06.21), #8 (12.15.21), #9 (02.09.22), #10 (05.11.22), #11 (8.24.22), #12 (10.05.22), #13 (12.14.22), #14 (02.22.23), #15 (07.26.23)  - s/p IVE OS #1 (04.28.21), #2 (05.28.21), #3 (12.15.21), #4 (02.09.22), #5 (05.10.23), #6 (07.26.23)  - S/P focal laser OS (09.02.20)  - s/p focal laser OD (11.17.21)  - OCT shows OD: Interval improvement in central IRF/IRHM---just trace cystic changes remain IT macula; OS: ERM; mild interval improvement in IRF and cystic changes temporal mac at 10 weeks  - BCVA OD 20/25  OS 20/20---stable OU  - recommend IVE OD #16 and IVE OS #7 today, 07.26.23 with f/u in 11 weeks  - pt wishes to proceed  - informed consent obtained and signed (07.26.23)  - RBA of procedure discussed, questions answered - see procedure note   - no longer taking Bromfenac BID OU -- ok  - no longer taking  PF BID OU -- ok  - f/u 11 weeks -- DFE/OCT possible injection(s)  2. PVD / vitreous syneresis OD  - Discussed findings and prognosis  - No RT or RD on 360 scleral depressed exam  - Reviewed signs/symptoms of RT/RD  - Strict return precautions for any such RT/RD signs/symptoms   3. Epiretinal membrane, left eye  - mild ERM with VMA/VMT component - BCVA remains 20/20 - still asymptomatic, no metamorphopsia - no indication for surgery at this time - continue to monitor  4,5. Hypertensive  retinopathy OU  - discussed importance of tight BP control  -continue to monitor  6. Pseudophakia OU  - s/p CE/IOL OU (Dr. Marisa Hua, [place])  - beautiful surgeries, doing well  - continue to monitor  Ophthalmic Meds Ordered this visit:  Meds ordered this encounter  Medications   aflibercept (EYLEA) SOLN 2 mg   aflibercept (EYLEA) SOLN 2 mg     Return in about 11 weeks (around 07/30/2022) for DME OU, Dilated Exam, OCT, Possible Injxn.  There are no Patient Instructions on file for this visit.  This document serves as a record of services personally performed by Gardiner Sleeper, MD, PhD. It was created on their behalf by Roselee Nova, COMT. The creation of this record is the provider's dictation and/or activities during the visit.  Electronically signed by: Roselee Nova, COMT 05/16/22 1:10 AM  Gardiner Sleeper, M.D., Ph.D. Diseases & Surgery of the Retina and Vitreous [facility]  I have reviewed the above documentation for accuracy and completeness, and I agree with the above. Gardiner Sleeper, M.D., Ph.D. 05/16/22 1:10 AM  Abbreviations: M myopia (nearsighted); A astigmatism; H hyperopia (farsighted); P presbyopia; Mrx spectacle prescription;  CTL contact lenses; OD right eye; OS left eye; OU both eyes  XT exotropia; ET esotropia; PEK punctate epithelial keratitis; PEE punctate epithelial erosions; DES dry eye syndrome; MGD meibomian gland dysfunction; ATs artificial tears; PFAT's preservative free artificial tears; [place] nuclear sclerotic cataract; PSC posterior subcapsular cataract; ERM  epi-retinal membrane; PVD posterior vitreous detachment; RD retinal detachment; DM diabetes mellitus; DR diabetic retinopathy; NPDR non-proliferative diabetic retinopathy; PDR proliferative diabetic retinopathy; CSME clinically significant macular edema; DME diabetic macular edema; dbh dot blot hemorrhages; CWS cotton wool spot; POAG primary open angle glaucoma; C/D  cup-to-disc ratio; HVF humphrey visual field; GVF goldmann visual field; OCT optical coherence tomography; IOP intraocular pressure; BRVO Branch retinal vein occlusion; CRVO central retinal vein occlusion; CRAO central retinal artery occlusion; BRAO branch retinal artery occlusion; RT retinal tear; SB scleral buckle; PPV pars plana vitrectomy; VH Vitreous hemorrhage; PRP panretinal laser photocoagulation; IVK intravitreal kenalog; VMT vitreomacular traction; MH Macular hole;  NVD neovascularization of the disc; NVE neovascularization elsewhere; AREDS age related eye disease study; ARMD age related macular degeneration; POAG primary open angle glaucoma; EBMD epithelial/anterior basement membrane dystrophy; ACIOL anterior chamber intraocular lens; IOL intraocular lens; PCIOL posterior chamber intraocular lens; Phaco/IOL phacoemulsification with intraocular lens placement; [place] photorefractive keratectomy; LASIK laser assisted in situ keratomileusis; HTN hypertension; DM diabetes mellitus; COPD chronic obstructive pulmonary disease

## 2022-05-14 ENCOUNTER — Encounter (INDEPENDENT_AMBULATORY_CARE_PROVIDER_SITE_OTHER): Payer: Self-pay | Admitting: Ophthalmology

## 2022-05-14 ENCOUNTER — Ambulatory Visit (INDEPENDENT_AMBULATORY_CARE_PROVIDER_SITE_OTHER): Payer: PPO | Admitting: Ophthalmology

## 2022-05-14 DIAGNOSIS — H25812 Combined forms of age-related cataract, left eye: Secondary | ICD-10-CM

## 2022-05-14 DIAGNOSIS — H35372 Puckering of macula, left eye: Secondary | ICD-10-CM

## 2022-05-14 DIAGNOSIS — H43811 Vitreous degeneration, right eye: Secondary | ICD-10-CM

## 2022-05-14 DIAGNOSIS — E113313 Type 2 diabetes mellitus with moderate nonproliferative diabetic retinopathy with macular edema, bilateral: Secondary | ICD-10-CM | POA: Diagnosis not present

## 2022-05-14 DIAGNOSIS — I10 Essential (primary) hypertension: Secondary | ICD-10-CM

## 2022-05-14 DIAGNOSIS — Z961 Presence of intraocular lens: Secondary | ICD-10-CM | POA: Diagnosis not present

## 2022-05-14 DIAGNOSIS — H35033 Hypertensive retinopathy, bilateral: Secondary | ICD-10-CM

## 2022-05-14 DIAGNOSIS — H25813 Combined forms of age-related cataract, bilateral: Secondary | ICD-10-CM

## 2022-05-14 MED ORDER — AFLIBERCEPT 2MG/0.05ML IZ SOLN FOR KALEIDOSCOPE
2.0000 mg | INTRAVITREAL | Status: AC | PRN
  Administered 2022-05-14: 2 mg via INTRAVITREAL

## 2022-06-29 ENCOUNTER — Other Ambulatory Visit: Payer: Self-pay | Admitting: Family Medicine

## 2022-06-29 DIAGNOSIS — E119 Type 2 diabetes mellitus without complications: Secondary | ICD-10-CM

## 2022-07-24 NOTE — Progress Notes (Signed)
[facility] Note  07/30/2022     CHIEF COMPLAINT Patient presents for Retina Follow Up    HISTORY OF PRESENT ILLNESS: Carolyn Hunter is a 71 y.o. female who presents to the clinic today for:   HPI     Retina Follow Up   Patient presents with  Diabetic Retinopathy.  In both eyes.  This started 11 weeks ago.  Duration of 11 weeks.  Since onset it is stable.  I, the attending physician,  performed the HPI with the patient and updated documentation appropriately.        Comments   11 week retina follow up NPDR OU and IVE OU pt is reporting no vision changes noticed       Last edited by Bernarda Caffey, MD on 08/01/2022  1:32 AM.       Referring physician: Neale Burly, MD [place],  [place] [id number]  HISTORICAL INFORzMATION:   Selected notes from the MEDICAL RECORD NUMBER Referral from Dr. Jeb Levering for DM exam   CURRENT MEDICATIONS: Current Outpatient Medications (Ophthalmic Drugs)  Medication Sig   Aflibercept (EYLEA IO) Inject 1 Dose into the eye every 30 (thirty) days. (Patient not taking: Reported on 12/18/2021)   No current facility-administered medications for this visit. (Ophthalmic Drugs)   Current Outpatient Medications (Other)  Medication Sig   amLODipine (NORVASC) 5 MG tablet TAKE 1 TABLET BY MOUTH EVERY DAY   Cholecalciferol (VITAMIN D3) 50 MCG (2000 UT) TABS Take 2,000 Units by mouth daily.   Liniments (SALONPAS PAIN RELIEF PATCH EX) Place 1 patch onto the skin daily as needed (pain.).   metFORMIN (GLUCOPHAGE) 1000 MG tablet TAKE 1 TABLET BY MOUTH EVERY DAY WITH BREAKFAST   propranolol ER (INDERAL LA) 60 MG 24 hr capsule TAKE 1 CAPSULE BY MOUTH EVERY DAY   rosuvastatin (CRESTOR) 40 MG tablet Take 1 tablet (40 mg total) by mouth daily.   No current facility-administered medications for this visit. (Other)   REVIEW OF SYSTEMS:   ALLERGIES Allergies  Allergen Reactions   Ace Inhibitors Cough   Zetia  [Ezetimibe] Swelling    Sore throat   PAST MEDICAL HISTORY Past Medical History:  Diagnosis Date   Anxiety    Cataract    OS   Diabetes mellitus without complication (HCC)    Diabetic retinopathy ([place])    NPDR OU   Hyperlipidemia    Hypertension    Hypertensive retinopathy    OU   Tremors of nervous system    "just when I get in a nervous Situation".   Past Surgical History:  Procedure Laterality Date   CATARACT EXTRACTION W/PHACO Right 10/18/2018   Procedure: CATARACT EXTRACTION PHACO AND INTRAOCULAR LENS PLACEMENT (IOC);  Surgeon: Baruch Goldmann, MD;  Location: [facility];  Service: Ophthalmology;  Laterality: Right;  CDE: 8.11   CATARACT EXTRACTION W/PHACO Left 01/17/2019   Procedure: CATARACT EXTRACTION PHACO AND INTRAOCULAR LENS PLACEMENT (IOC);  Surgeon: Baruch Goldmann, MD;  Location: [facility];  Service: Ophthalmology;  Laterality: Left;  CDE: 6.38   CESAREAN SECTION     1988   COLONOSCOPY WITH PROPOFOL N/A 05/04/2020   Procedure: COLONOSCOPY WITH PROPOFOL;  Surgeon: Harvel Quale, MD;  Location: [facility];  Service: Gastroenterology;  Laterality: N/A;  830   EYE SURGERY Right    Cataract extraction OD   POLYPECTOMY  05/04/2020   Procedure: POLYPECTOMY;  Surgeon: Harvel Quale, MD;  Location: [facility];  Service: Gastroenterology;;   FAMILY HISTORY Family History  Problem Relation Age of Onset   Cancer Mother        breast and colon cancer   Breast cancer Mother    Diabetes Brother    Diabetes Son    Asthma Father        COPD   Diabetes Son    SOCIAL HISTORY Social History   Tobacco Use   Smoking status: Never   Smokeless tobacco: Never  Vaping Use   Vaping Use: Never used  Substance Use Topics   Alcohol use: No   Drug use: No       OPHTHALMIC EXAM:  Base Eye Exam     Visual Acuity (Snellen - Linear)       Right Left   Dist [place] 20/20 -2 20/25 -1         Tonometry (Tonopen, 1:37 PM)       Right Left   Pressure 15 13          Pupils       Pupils Dark Light Shape React APD   Right PERRL 3 2 Round Minimal None   Left PERRL 3 2 Round Minimal None         Visual Fields       Left Right    Full Full         Extraocular Movement       Right Left    Full, Ortho Full, Ortho         Neuro/Psych     Oriented x3: Yes   Mood/Affect: Normal         Dilation     Both eyes: 2.5% Phenylephrine @ 1:38 PM           Slit Lamp and Fundus Exam     Slit Lamp Exam       Right Left   Lids/Lashes Dermatochalasis - upper lid, Meibomian gland dysfunction Dermatochalasis - upper lid, Meibomian gland dysfunction   Conjunctiva/Sclera White and quiet White and quiet   Cornea Mild Arcus, Well healed temporal cataract wounds, trace Punctate epithelial erosions Mild Arcus, EBMD, trace Punctate epithelial erosions, Well healed temporal cataract wounds, mild Debris in tear film   Anterior Chamber Deep, 1+cell/pigment Deep and quiet   Iris Round and dilated, No NVI, PPM nasally Round and dilated, No NVI   Lens PC IOL in good position PC IOL in good position, trace PCO   Anterior Vitreous Vitreous syneresis, mild vitreous condensations, Posterior vitreous detachment, old, white VH settled inferiorly Vitreous syneresis         Fundus Exam       Right Left   Disc mild pallor, Sharp rim mild pallor, Sharp rim   C/D Ratio 0.4 0.4   Macula Flat, good foveal reflex, stable improvement in central edema, focal exudates and cystic changes temporal mac -- persistent, ERM temporal macula Flat, good foveal reflex, epiretinal membrane, punctate exudates and edema temporal macula -- improved, minimal MA   Vessels attenuated, Tortuous attenuated, Tortuous   Periphery Attached, scattered MA's, inferior retina partially obscured by vitreous condensations / old VH attached, scattered DBH greatest nasally and IN, focal exudates nasal midzone - improved           IMAGING AND PROCEDURES  Imaging and Procedures  for 12/10/17  OCT, Retina - OU - Both Eyes       Right Eye Quality was good. Central Foveal Thickness: 295. Progression has been stable. Findings include  no SRF, abnormal foveal contour, intraretinal hyper-reflective material, intraretinal fluid, outer retinal atrophy (Stable improvement in central IRF/IRHM---just trace cystic changes remain IT macula).   Left Eye Quality was good. Central Foveal Thickness: 342. Progression has improved. Findings include no SRF, abnormal foveal contour, intraretinal hyper-reflective material, epiretinal membrane, intraretinal fluid, macular pucker, vitreomacular adhesion (ERM; mild interval improvement in [facility] and cystic changes temporal mac).   Notes **Images taken, stored on drive**  Diagnosis / Impression:  DME OU (OD>OS) OD: Stable improvement in central IRF/IRHM---just trace cystic changes remain IT macula OS: ERM; mild interval improvment in IRF and cystic changes temporal mac  Clinical management:  See below  Abbreviations: NFP - Normal foveal profile. CME - cystoid macular edema. PED - pigment epithelial detachment. IRF - intraretinal fluid. SRF - subretinal fluid. EZ - ellipsoid zone. ERM - epiretinal membrane. ORA - outer retinal atrophy. ORT - outer retinal tubulation. SRHM - subretinal hyper-reflective material       Intravitreal Injection, Pharmacologic Agent - OD - Right Eye       Time Out 07/30/2022. 2:27 PM. Confirmed correct patient, procedure, site, and patient consented.   Anesthesia Topical anesthesia was used. Anesthetic medications included Lidocaine 2%, Proparacaine 0.5%.   Procedure Preparation included 5% betadine to ocular surface, eyelid speculum. A (32g) needle was used.   Injection: 2 mg aflibercept 2 MG/0.05ML   Route: Intravitreal, Site: Right Eye   NDC: A3590391, Lot: [id number], Expiration date: 10/09/2023, Waste: 0 mL   Post-op Post injection exam found visual acuity of at least counting fingers. The  patient tolerated the procedure well. There were no complications. The patient received written and verbal post procedure care education. Post injection medications were not given.      Intravitreal Injection, Pharmacologic Agent - OS - Left Eye       Time Out 07/30/2022. 2:27 PM. Confirmed correct patient, procedure, site, and patient consented.   Anesthesia Topical anesthesia was used. Anesthetic medications included Lidocaine 2%, Proparacaine 0.5%.   Procedure Preparation included 5% betadine to ocular surface, eyelid speculum. A (32g) needle was used.   Injection: 2 mg aflibercept 2 MG/0.05ML   Route: Intravitreal, Site: Left Eye   NDC: A3590391, Lot: [id number], Expiration date: 10/09/2023, Waste: 0 mL   Post-op Post injection exam found visual acuity of at least counting fingers. The patient tolerated the procedure well. There were no complications. The patient received written and verbal post procedure care education. Post injection medications were not given.            ASSESSMENT/PLAN:   ICD-10-CM   1. 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 Posterior vitreous detachment of right eye  H43.811     3. Essential hypertension  I10     4. Hypertensive retinopathy of both eyes  H35.033     5. Epiretinal membrane (ERM) of left eye  H35.372     6. Pseudophakia of both eyes  Z96.1     7. Pseudophakia of right eye  Z96.1     8. Combined forms of age-related cataract of left eye  H25.812        1.  Moderate nonproliferative diabetic retinopathy, both eyes  - Diabetic macular edema, both eyes -- relatively mild  - S/P IVA OD #1 (12.17.18), #2  (01.28.19), #3 (02.27.19), #4 (04.03.19), #5 (05.01.19), #6 (05.27.20), #7 (07.08.20)  - in reviewing her case, there was minimal response to IVA in OCT or BCVA  - S/P IVTA OD #1 (05.29.19), #2 (07.24.19), #3 (09.04.19), #4 (12.04.19), #5 (02.26.20), # 6 (08.05.20), # 7(09.30.20), #8 (12.23.20)  - s/p IVE OD #1 (03.03.21), #2 (03.31.21), #3 (04.28.21), #4 (05.28.21), #5 (08.04.21), #6 (09.08.21), #7 (10.06.21), #8 (12.15.21), #9 (02.09.22), #10 (05.11.22), #11 (8.24.22), #12 (10.05.22), #13 (12.14.22), #14 (02.22.23), #15 (07.26.23), #16 (07.26.23), #17 (10.04.23)  - s/p IVE OS #1 (04.28.21), #2 (05.28.21), #3 (12.15.21), #4 (02.09.22), #5 (05.10.23), #6 (07.26.23), #7 (07.26.23), #8 (10.04.23)  - S/P focal laser OS (09.02.20)  - s/p focal laser OD (11.17.21)  - OCT shows OD: Stable improvement in central IRF/IRHM---just trace cystic changes remain IT macula; OS: ERM; mild interval improvment in IRF and cystic changes temporal mac  - BCVA OD 20/25  OS 20/20---stable OU  - recommend IVE OD #18 and IVE OS #9 today, 12.20.23 with f/u in 11 weeks  - pt wishes to proceed  - RBA of procedure discussed, questions answered - IVE informed consent obtained and re-signed (07.26.23) - see procedure note   - f/u 11 weeks -- DFE/OCT possible injection(s)  2. PVD / vitreous syneresis OD  - Discussed findings and prognosis  - No RT or RD on 360 scleral depressed exam  - Reviewed signs/symptoms of RT/RD  - Strict return precautions for any such RT/RD signs/symptoms   3. Epiretinal membrane, left eye  - mild ERM with VMA/VMT component - BCVA remains 20/20 - still asymptomatic, no metamorphopsia - no indication for surgery at this time - continue to monitor  4,5. Hypertensive retinopathy OU  - discussed importance of tight BP control  -continue to monitor  6. Pseudophakia OU  - s/p CE/IOL OU (Dr. Marisa Hua, [place])  - beautiful surgeries, doing well  - continue to monitor  Ophthalmic Meds  Ordered this visit:  Meds ordered this encounter  Medications   aflibercept (EYLEA) SOLN 2 mg   aflibercept (EYLEA) SOLN 2 mg     Return in about 11 weeks (around 10/15/2022) for f/u NPDR OU, DFE, OCT.  There are no Patient Instructions on file for this visit.  This document serves as a record of services personally performed by Gardiner Sleeper, MD, PhD. It was created on their behalf by Orvan Falconer, an ophthalmic technician. The creation of this record is the provider's dictation and/or activities during the visit.    Electronically signed by: Orvan Falconer, OA, 08/01/22  1:35 AM  This document serves as a record of services personally performed by Gardiner Sleeper, MD, PhD. It was created on their behalf by San Jetty. Owens Shark, OA an ophthalmic technician. The creation of this record is the provider's dictation and/or activities during the visit.    Electronically signed by: San Jetty. Owens Shark, [place] 12.20.2023 1:35 AM  Gardiner Sleeper, M.D., Ph.D. Diseases & Surgery of the Retina and Vitreous [facility]  I have reviewed the above documentation for accuracy and completeness, and I agree with the above. Gardiner Sleeper, M.D., Ph.D. 08/01/22 1:37 AM  Abbreviations: M myopia (nearsighted); A astigmatism; H hyperopia (farsighted); P presbyopia; Mrx spectacle prescription;  CTL contact lenses; OD right eye; OS left eye; OU both eyes  XT exotropia;  ET esotropia; PEK punctate epithelial keratitis; PEE punctate epithelial erosions; DES dry eye syndrome; MGD meibomian gland dysfunction; ATs artificial tears; PFAT's preservative free artificial tears; [place] nuclear sclerotic cataract; PSC posterior subcapsular cataract; ERM epi-retinal membrane; PVD posterior vitreous detachment; RD retinal detachment; DM diabetes mellitus; DR diabetic retinopathy; NPDR non-proliferative diabetic retinopathy; PDR proliferative diabetic retinopathy; CSME clinically significant macular edema; DME  diabetic macular edema; dbh dot blot hemorrhages; CWS cotton wool spot; POAG primary open angle glaucoma; C/D cup-to-disc ratio; HVF humphrey visual field; GVF goldmann visual field; OCT optical coherence tomography; IOP intraocular pressure; BRVO Branch retinal vein occlusion; CRVO central retinal vein occlusion; CRAO central retinal artery occlusion; BRAO branch retinal artery occlusion; RT retinal tear; SB scleral buckle; PPV pars plana vitrectomy; VH Vitreous hemorrhage; PRP panretinal laser photocoagulation; IVK intravitreal kenalog; VMT vitreomacular traction; MH Macular hole;  NVD neovascularization of the disc; NVE neovascularization elsewhere; AREDS age related eye disease study; ARMD age related macular degeneration; POAG primary open angle glaucoma; EBMD epithelial/anterior basement membrane dystrophy; ACIOL anterior chamber intraocular lens; IOL intraocular lens; PCIOL posterior chamber intraocular lens; Phaco/IOL phacoemulsification with intraocular lens placement; [place] photorefractive keratectomy; LASIK laser assisted in situ keratomileusis; HTN hypertension; DM diabetes mellitus; COPD chronic obstructive pulmonary disease

## 2022-07-30 ENCOUNTER — Ambulatory Visit (INDEPENDENT_AMBULATORY_CARE_PROVIDER_SITE_OTHER): Payer: PPO | Admitting: Ophthalmology

## 2022-07-30 ENCOUNTER — Encounter (INDEPENDENT_AMBULATORY_CARE_PROVIDER_SITE_OTHER): Payer: Self-pay | Admitting: Ophthalmology

## 2022-07-30 DIAGNOSIS — I10 Essential (primary) hypertension: Secondary | ICD-10-CM | POA: Diagnosis not present

## 2022-07-30 DIAGNOSIS — H35372 Puckering of macula, left eye: Secondary | ICD-10-CM

## 2022-07-30 DIAGNOSIS — H35033 Hypertensive retinopathy, bilateral: Secondary | ICD-10-CM | POA: Diagnosis not present

## 2022-07-30 DIAGNOSIS — E113313 Type 2 diabetes mellitus with moderate nonproliferative diabetic retinopathy with macular edema, bilateral: Secondary | ICD-10-CM

## 2022-07-30 DIAGNOSIS — H43811 Vitreous degeneration, right eye: Secondary | ICD-10-CM

## 2022-07-30 DIAGNOSIS — Z961 Presence of intraocular lens: Secondary | ICD-10-CM

## 2022-07-30 DIAGNOSIS — H25812 Combined forms of age-related cataract, left eye: Secondary | ICD-10-CM

## 2022-07-30 MED ORDER — AFLIBERCEPT 2MG/0.05ML IZ SOLN FOR KALEIDOSCOPE
2.0000 mg | INTRAVITREAL | Status: AC | PRN
  Administered 2022-07-30: 2 mg via INTRAVITREAL

## 2022-10-13 NOTE — Progress Notes (Signed)
[facility] Note  10/20/2022     CHIEF COMPLAINT Patient presents for Retina Follow Up    HISTORY OF PRESENT ILLNESS: Carolyn Hunter is a 72 y.o. female who presents to the clinic today for:   HPI     Retina Follow Up   Patient presents with  Diabetic Retinopathy.  In both eyes.  Severity is moderate.  Duration of 12 weeks.  Since onset it is stable.  I, the attending physician,  performed the HPI with the patient and updated documentation appropriately.        Comments   Pt here for 12 wk ret f/u NPDR OU. Pt feels VA is the same, no changes noted.       Last edited by Bernarda Caffey, MD on 10/20/2022  2:48 PM.    Pt states vision is stable    Referring physician: Neale Burly, MD [place],  [place] [id number]  HISTORICAL INFORMATION:   Selected notes from the MEDICAL RECORD NUMBER Referral from Dr. Jeb Levering for DM exam   CURRENT MEDICATIONS: Current Outpatient Medications (Ophthalmic Drugs)  Medication Sig   Aflibercept (EYLEA IO) Inject 1 Dose into the eye every 30 (thirty) days. Injectable med given @ retina office q 12 wks.   No current facility-administered medications for this visit. (Ophthalmic Drugs)   Current Outpatient Medications (Other)  Medication Sig   amLODipine (NORVASC) 5 MG tablet TAKE 1 TABLET BY MOUTH EVERY DAY   Cholecalciferol (VITAMIN D3) 50 MCG (2000 UT) TABS Take 2,000 Units by mouth daily.   Liniments (SALONPAS PAIN RELIEF PATCH EX) Place 1 patch onto the skin daily as needed (pain.).   metFORMIN (GLUCOPHAGE) 1000 MG tablet TAKE 1 TABLET BY MOUTH EVERY DAY WITH BREAKFAST   propranolol ER (INDERAL LA) 60 MG 24 hr capsule TAKE 1 CAPSULE BY MOUTH EVERY DAY   rosuvastatin (CRESTOR) 40 MG tablet Take 1 tablet (40 mg total) by mouth daily.   No current facility-administered medications for this visit. (Other)   REVIEW OF SYSTEMS: ROS   Positive for: Neurological, Endocrine, Eyes Negative for:  Constitutional, Gastrointestinal, Skin, Genitourinary, Musculoskeletal, HENT, Cardiovascular, Respiratory, Psychiatric, Allergic/Imm, Heme/Lymph Last edited by Kingsley Spittle, COT on 10/20/2022  1:25 PM.     ALLERGIES Allergies  Allergen Reactions   Ace Inhibitors Cough   Zetia [Ezetimibe] Swelling    Sore throat   PAST MEDICAL HISTORY Past Medical History:  Diagnosis Date   Anxiety    Cataract    OS   Diabetes mellitus without complication ([place])    Diabetic retinopathy ([place])    NPDR OU   Hyperlipidemia    Hypertension    Hypertensive retinopathy    OU   Tremors of nervous system    "just when I get in a nervous Situation".   Past Surgical History:  Procedure Laterality Date   CATARACT EXTRACTION W/PHACO Right 10/18/2018   Procedure: CATARACT EXTRACTION PHACO AND INTRAOCULAR LENS PLACEMENT (IOC);  Surgeon: Baruch Goldmann, MD;  Location: [facility];  Service: Ophthalmology;  Laterality: Right;  CDE: 8.11   CATARACT EXTRACTION W/PHACO Left 01/17/2019   Procedure: CATARACT EXTRACTION PHACO AND INTRAOCULAR LENS PLACEMENT (IOC);  Surgeon: Baruch Goldmann, MD;  Location: [facility];  Service: Ophthalmology;  Laterality: Left;  CDE: 6.38   CESAREAN SECTION     1988   COLONOSCOPY WITH PROPOFOL N/A 05/04/2020   Procedure: COLONOSCOPY WITH PROPOFOL;  Surgeon: Harvel Quale, MD;  Location: [facility]  END[facility];  Service: Gastroenterology;  Laterality: N/A;  830   EYE SURGERY Right    Cataract extraction OD   POLYPECTOMY  05/04/2020   Procedure: POLYPECTOMY;  Surgeon: Montez Morita, Quillian Quince, MD;  Location: [facility];  Service: Gastroenterology;;   FAMILY HISTORY Family History  Problem Relation Age of Onset   Cancer Mother        breast and colon cancer   Breast cancer Mother    Diabetes Brother    Diabetes Son    Asthma Father        COPD   Diabetes Son    SOCIAL HISTORY Social History   Tobacco Use   Smoking status: Never   Smokeless tobacco: Never  Vaping Use    Vaping Use: Never used  Substance Use Topics   Alcohol use: No   Drug use: No       OPHTHALMIC EXAM:  Base Eye Exam     Visual Acuity (Snellen - Linear)       Right Left   Dist [place] 20/20 -2 20/25         Tonometry (Tonopen, 1:31 PM)       Right Left   Pressure 13 13         Pupils       Pupils Dark Light Shape React APD   Right PERRL 3 2 Round Minimal None   Left PERRL 3 2 Round Minimal None         Visual Fields (Counting fingers)       Left Right    Full Full         Extraocular Movement       Right Left    Full, Ortho Full, Ortho         Neuro/Psych     Oriented x3: Yes   Mood/Affect: Normal         Dilation     Both eyes: 1.0% Mydriacyl, 2.5% Phenylephrine @ 1:31 PM           Slit Lamp and Fundus Exam     Slit Lamp Exam       Right Left   Lids/Lashes Dermatochalasis - upper lid, Meibomian gland dysfunction Dermatochalasis - upper lid, Meibomian gland dysfunction   Conjunctiva/Sclera White and quiet White and quiet   Cornea Mild Arcus, Well healed temporal cataract wounds, trace Punctate epithelial erosions Mild Arcus, EBMD, trace Punctate epithelial erosions, Well healed temporal cataract wounds, mild Debris in tear film   Anterior Chamber Deep, 1+cell/pigment Deep and quiet   Iris Round and dilated, No NVI, PPM nasally Round and dilated, No NVI   Lens PC IOL in good position PC IOL in good position, trace PCO   Anterior Vitreous Vitreous syneresis, mild vitreous condensations, Posterior vitreous detachment, old, white VH settled inferiorly Vitreous syneresis         Fundus Exam       Right Left   Disc mild pallor, Sharp rim mild pallor, Sharp rim   C/D Ratio 0.4 0.4   Macula Flat, good foveal reflex, stable improvement in central edema, focal exudates and cystic changes temporal mac -- slightly improved, ERM temporal macula Flat, good foveal reflex, epiretinal membrane, punctate exudates and edema temporal macula --  improved, minimal MA   Vessels attenuated, Tortuous attenuated, Tortuous   Periphery Attached, scattered MA's, inferior retina partially obscured by vitreous condensations / old VH attached, scattered DBH greatest nasally and IN, focal exudates nasal midzone - improved  IMAGING AND PROCEDURES  Imaging and Procedures for 12/10/17  OCT, Retina - OU - Both Eyes       Right Eye Quality was good. Central Foveal Thickness: 288. Progression has improved. Findings include no SRF, abnormal foveal contour, intraretinal hyper-reflective material, intraretinal fluid, outer retinal atrophy (Stable improvement in central IRF/IRHM---just trace cystic changes remain IT macula (improved)).   Left Eye Quality was good. Central Foveal Thickness: 341. Progression has been stable. Findings include no SRF, abnormal foveal contour, intraretinal hyper-reflective material, epiretinal membrane, intraretinal fluid, macular pucker, vitreomacular adhesion (ERM; stable improvement in [facility] and cystic changes temporal mac).   Notes **Images taken, stored on drive**  Diagnosis / Impression:  DME OU (OD>OS) OD: Stable improvement in central IRF/IRHM---just trace cystic changes remain IT macula (improved) OS: ERM; stable improvment in IRF and cystic changes temporal mac  Clinical management:  See below  Abbreviations: NFP - Normal foveal profile. CME - cystoid macular edema. PED - pigment epithelial detachment. IRF - intraretinal fluid. SRF - subretinal fluid. EZ - ellipsoid zone. ERM - epiretinal membrane. ORA - outer retinal atrophy. ORT - outer retinal tubulation. SRHM - subretinal hyper-reflective material       Intravitreal Injection, Pharmacologic Agent - OD - Right Eye       Time Out 10/20/2022. 2:52 PM. Confirmed correct patient, procedure, site, and patient consented.   Anesthesia Topical anesthesia was used. Anesthetic medications included Lidocaine 2%, Proparacaine 0.5%.    Procedure Preparation included 5% betadine to ocular surface, eyelid speculum. A (32g) needle was used.   Injection: 2 mg aflibercept 2 MG/0.05ML   Route: Intravitreal, Site: Right Eye   NDC: A3590391, Lot: [id number], Expiration date: 10/09/2023, Waste: 0 mL   Post-op Post injection exam found visual acuity of at least counting fingers. The patient tolerated the procedure well. There were no complications. The patient received written and verbal post procedure care education. Post injection medications were not given.      Intravitreal Injection, Pharmacologic Agent - OS - Left Eye       Time Out 10/20/2022. 2:52 PM. Confirmed correct patient, procedure, site, and patient consented.   Anesthesia Topical anesthesia was used. Anesthetic medications included Lidocaine 2%, Proparacaine 0.5%.   Procedure Preparation included 5% betadine to ocular surface, eyelid speculum. A (32g) needle was used.   Injection: 2 mg aflibercept 2 MG/0.05ML   Route: Intravitreal, Site: Left Eye   NDC: A3590391, Lot: [id number], Expiration date: 06/11/2023, Waste: 0 mL   Post-op Post injection exam found visual acuity of at least counting fingers. The patient tolerated the procedure well. There were no complications. The patient received written and verbal post procedure care education. Post injection medications were not given.            ASSESSMENT/PLAN:   ICD-10-CM   1. 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 Posterior vitreous detachment of right eye  H43.811     3. Essential hypertension  I10     4. Hypertensive retinopathy of both eyes  H35.033     5.  Epiretinal membrane (ERM) of left eye  H35.372     6. Pseudophakia  of both eyes  Z96.1       1. Moderate nonproliferative diabetic retinopathy, both eyes  - Diabetic macular edema, both eyes -- relatively mild  - S/P IVA OD #1 (12.17.18), #2 (01.28.19), #3 (02.27.19), #4 (04.03.19), #5 (05.01.19), #6 (05.27.20), #7 (07.08.20)  - in reviewing her case, there was minimal response to IVA in OCT or BCVA  - S/P IVTA OD #1 (05.29.19), #2 (07.24.19), #3 (09.04.19), #4 (12.04.19), #5 (02.26.20), # 6 (08.05.20), # 7(09.30.20), #8 (12.23.20)  - s/p IVE OD #1 (03.03.21), #2 (03.31.21), #3 (04.28.21), #4 (05.28.21), #5 (08.04.21), #6 (09.08.21), #7 (10.06.21), #8 (12.15.21), #9 (02.09.22), #10 (05.11.22), #11 (8.24.22), #12 (10.05.22), #13 (12.14.22), #14 (02.22.23), #15 (07.26.23), #16 (07.26.23), #17 (10.04.23), #18 (12.20.23)  - s/p IVE OS #1 (04.28.21), #2 (05.28.21), #3 (12.15.21), #4 (02.09.22), #5 (05.10.23), #6 (07.26.23), #7 (07.26.23), #8 (10.04.23), #9 (12.20.23)  - S/P focal laser OS (09.02.20)  - s/p focal laser OD (11.17.21)  - OCT shows OD: Stable improvement in central IRF/IRHM---just trace cystic changes remain IT macula (improved); OS: ERM; stable improvment in IRF and cystic changes temporal mac  - BCVA OD 20/20  OS 20/25---stable OU at 12 weeks  - recommend IVE OD #19 and IVE OS #10 today, 03.11.24 with ext f/u to 14 weeks  - pt wishes to proceed  - RBA of procedure discussed, questions answered - IVE informed consent obtained and re-signed (07.26.23) - see procedure note   - f/u 14 weeks -- DFE/OCT possible injection(s)  2. PVD / vitreous syneresis OD  - Discussed findings and prognosis  - No RT or RD on 360 scleral depressed exam  - Reviewed signs/symptoms of RT/RD  - Strict return precautions for any such RT/RD signs/symptoms   3. Epiretinal membrane, left eye  - mild ERM with VMA/VMT component - BCVA remains 20/20 - still asymptomatic, no metamorphopsia - no indication  for surgery at this time - continue to monitor  4,5. Hypertensive retinopathy OU  - discussed importance of tight BP control  -continue to monitor  6. Pseudophakia OU  - s/p CE/IOL OU (Dr. Marisa Hua, [place])  - beautiful surgeries, doing well  - continue to monitor  Ophthalmic Meds Ordered this visit:  Meds ordered this encounter  Medications   aflibercept (EYLEA) SOLN 2 mg   aflibercept (EYLEA) SOLN 2 mg     Return in about 14 weeks (around 01/26/2023) for f/u NPDR OU, DFE, OCT.  There are no Patient Instructions on file for this visit.  This document serves as a record of services personally performed by Gardiner Sleeper, MD, PhD. It was created on their behalf by Orvan Falconer, an ophthalmic technician. The creation of this record is the provider's dictation and/or activities during the visit.    Electronically signed by: Orvan Falconer, OA, 10/20/22  10:14 PM  Gardiner Sleeper, M.D., Ph.D. Diseases & Surgery of the Retina and Vitreous [facility]  I have reviewed the above documentation for accuracy and completeness, and I agree with the above. Gardiner Sleeper, M.D., Ph.D. 10/20/22 10:17 PM   Abbreviations: M myopia (nearsighted); A astigmatism; H hyperopia (farsighted); P presbyopia; Mrx spectacle prescription;  CTL contact lenses; OD right eye; OS left eye; OU both eyes  XT exotropia; ET esotropia; PEK punctate epithelial keratitis; PEE punctate epithelial erosions; DES dry eye syndrome; MGD meibomian gland dysfunction; ATs artificial tears; PFAT's preservative free artificial tears; [place] nuclear sclerotic cataract; PSC posterior subcapsular cataract; ERM epi-retinal membrane; PVD  posterior vitreous detachment; RD retinal detachment; DM diabetes mellitus; DR diabetic retinopathy; NPDR non-proliferative diabetic retinopathy; PDR proliferative diabetic retinopathy; CSME clinically significant macular edema; DME diabetic macular edema; dbh dot blot  hemorrhages; CWS cotton wool spot; POAG primary open angle glaucoma; C/D cup-to-disc ratio; HVF humphrey visual field; GVF goldmann visual field; OCT optical coherence tomography; IOP intraocular pressure; BRVO Branch retinal vein occlusion; CRVO central retinal vein occlusion; CRAO central retinal artery occlusion; BRAO branch retinal artery occlusion; RT retinal tear; SB scleral buckle; PPV pars plana vitrectomy; VH Vitreous hemorrhage; PRP panretinal laser photocoagulation; IVK intravitreal kenalog; VMT vitreomacular traction; MH Macular hole;  NVD neovascularization of the disc; NVE neovascularization elsewhere; AREDS age related eye disease study; ARMD age related macular degeneration; POAG primary open angle glaucoma; EBMD epithelial/anterior basement membrane dystrophy; ACIOL anterior chamber intraocular lens; IOL intraocular lens; PCIOL posterior chamber intraocular lens; Phaco/IOL phacoemulsification with intraocular lens placement; [place] photorefractive keratectomy; LASIK laser assisted in situ keratomileusis; HTN hypertension; DM diabetes mellitus; COPD chronic obstructive pulmonary disease

## 2022-10-20 ENCOUNTER — Ambulatory Visit (INDEPENDENT_AMBULATORY_CARE_PROVIDER_SITE_OTHER): Payer: PPO | Admitting: Ophthalmology

## 2022-10-20 ENCOUNTER — Encounter (INDEPENDENT_AMBULATORY_CARE_PROVIDER_SITE_OTHER): Payer: Self-pay | Admitting: Ophthalmology

## 2022-10-20 DIAGNOSIS — H43811 Vitreous degeneration, right eye: Secondary | ICD-10-CM

## 2022-10-20 DIAGNOSIS — Z961 Presence of intraocular lens: Secondary | ICD-10-CM | POA: Diagnosis not present

## 2022-10-20 DIAGNOSIS — E113313 Type 2 diabetes mellitus with moderate nonproliferative diabetic retinopathy with macular edema, bilateral: Secondary | ICD-10-CM | POA: Diagnosis not present

## 2022-10-20 DIAGNOSIS — H35372 Puckering of macula, left eye: Secondary | ICD-10-CM

## 2022-10-20 DIAGNOSIS — I10 Essential (primary) hypertension: Secondary | ICD-10-CM

## 2022-10-20 DIAGNOSIS — H35033 Hypertensive retinopathy, bilateral: Secondary | ICD-10-CM | POA: Diagnosis not present

## 2022-10-20 MED ORDER — AFLIBERCEPT 2MG/0.05ML IZ SOLN FOR KALEIDOSCOPE
2.0000 mg | INTRAVITREAL | Status: AC | PRN
  Administered 2022-10-20: 2 mg via INTRAVITREAL

## 2022-11-24 DIAGNOSIS — M8589 Other specified disorders of bone density and structure, multiple sites: Secondary | ICD-10-CM | POA: Diagnosis not present

## 2022-11-24 DIAGNOSIS — M810 Age-related osteoporosis without current pathological fracture: Secondary | ICD-10-CM | POA: Diagnosis not present

## 2022-12-30 DIAGNOSIS — E7801 Familial hypercholesterolemia: Secondary | ICD-10-CM | POA: Diagnosis not present

## 2022-12-30 DIAGNOSIS — E1169 Type 2 diabetes mellitus with other specified complication: Secondary | ICD-10-CM | POA: Diagnosis not present

## 2022-12-30 DIAGNOSIS — Z6826 Body mass index (BMI) 26.0-26.9, adult: Secondary | ICD-10-CM | POA: Diagnosis not present

## 2022-12-30 DIAGNOSIS — I10 Essential (primary) hypertension: Secondary | ICD-10-CM | POA: Diagnosis not present

## 2022-12-30 DIAGNOSIS — R251 Tremor, unspecified: Secondary | ICD-10-CM | POA: Diagnosis not present

## 2023-01-21 NOTE — Progress Notes (Signed)
[facility] - Clinic Note  01/27/2023     CHIEF COMPLAINT Patient presents for Retina Follow Up    HISTORY OF PRESENT ILLNESS: Carolyn Hunter is a 72 y.o. female who presents to the clinic today for:   HPI     Retina Follow Up   Patient presents with  Diabetic Retinopathy.  In both eyes.  This started 14 weeks ago.  Duration of 14 weeks.  Since onset it is stable.  I, the attending physician,  performed the HPI with the patient and updated documentation appropriately.        Comments   14 week retina follow up and I'VE OU pt is reporting no vision changes noticed she denies any flashes or floaters her last reading was 140 this morning       Last edited by Rennis Chris, MD on 01/27/2023  9:09 PM.    Pt states vision is stable    Referring physician: Toma Deiters, MD [address] [place],  [place] [address]  HISTORICAL INFORMATION:   Selected notes from the MEDICAL RECORD NUMBER Referral from Dr. Fabienne Bruns for DM exam   CURRENT MEDICATIONS: Current Outpatient Medications (Ophthalmic Drugs)  Medication Sig   Aflibercept (EYLEA IO) Inject 1 Dose into the eye every 30 (thirty) days. Injectable med given @ retina office q 12 wks.   No current facility-administered medications for this visit. (Ophthalmic Drugs)   Current Outpatient Medications (Other)  Medication Sig   amLODipine (NORVASC) 5 MG tablet TAKE 1 TABLET BY MOUTH EVERY DAY   Cholecalciferol (VITAMIN D3) 50 MCG (2000 UT) TABS Take 2,000 Units by mouth daily.   Liniments (SALONPAS PAIN RELIEF PATCH EX) Place 1 patch onto the skin daily as needed (pain.).   metFORMIN (GLUCOPHAGE) 1000 MG tablet TAKE 1 TABLET BY MOUTH EVERY DAY WITH BREAKFAST   propranolol ER (INDERAL LA) 60 MG 24 hr capsule TAKE 1 CAPSULE BY MOUTH EVERY DAY   rosuvastatin (CRESTOR) 40 MG tablet Take 1 tablet (40 mg total) by mouth daily.   No current facility-administered medications for this visit. (Other)   REVIEW  OF SYSTEMS: ROS   Positive for: Neurological, Endocrine, Eyes Negative for: Constitutional, Gastrointestinal, Skin, Genitourinary, Musculoskeletal, HENT, Cardiovascular, Respiratory, Psychiatric, Allergic/Imm, Heme/Lymph Last edited by Etheleen Mayhew, COT on 01/27/2023  1:19 PM.     ALLERGIES Allergies  Allergen Reactions   Ace Inhibitors Cough   Zetia [Ezetimibe] Swelling    Sore throat   PAST MEDICAL HISTORY Past Medical History:  Diagnosis Date   Anxiety    Cataract    OS   Diabetes mellitus without complication (HCC)    Diabetic retinopathy (HCC)    NPDR OU   Hyperlipidemia    Hypertension    Hypertensive retinopathy    OU   Tremors of nervous system    "just when I get in a nervous Situation".   Past Surgical History:  Procedure Laterality Date   CATARACT EXTRACTION W/PHACO Right 10/18/2018   Procedure: CATARACT EXTRACTION PHACO AND INTRAOCULAR LENS PLACEMENT (IOC);  Surgeon: Fabio Pierce, MD;  Location: [facility];  Service: Ophthalmology;  Laterality: Right;  CDE: 8.11   CATARACT EXTRACTION W/PHACO Left 01/17/2019   Procedure: CATARACT EXTRACTION PHACO AND INTRAOCULAR LENS PLACEMENT (IOC);  Surgeon: Fabio Pierce, MD;  Location: [facility];  Service: Ophthalmology;  Laterality: Left;  CDE: 6.38   CESAREAN SECTION     1988   COLONOSCOPY WITH PROPOFOL N/A 05/04/2020   Procedure:  COLONOSCOPY WITH PROPOFOL;  Surgeon: Marguerita Merles, Reuel Boom, MD;  Location: [facility];  Service: Gastroenterology;  Laterality: N/A;  830   EYE SURGERY Right    Cataract extraction OD   POLYPECTOMY  05/04/2020   Procedure: POLYPECTOMY;  Surgeon: Marguerita Merles, Reuel Boom, MD;  Location: [facility];  Service: Gastroenterology;;   FAMILY HISTORY Family History  Problem Relation Age of Onset   Cancer Mother        breast and colon cancer   Breast cancer Mother    Diabetes Brother    Diabetes Son    Asthma Father        COPD   Diabetes Son    SOCIAL HISTORY Social History    Tobacco Use   Smoking status: Never   Smokeless tobacco: Never  Vaping Use   Vaping Use: Never used  Substance Use Topics   Alcohol use: No   Drug use: No       OPHTHALMIC EXAM:  Base Eye Exam     Visual Acuity (Snellen - Linear)       Right Left   Dist [place] 20/20 -1 20/25         Tonometry (Tonopen, 1:23 PM)       Right Left   Pressure 10 11         Pupils       Pupils Dark Light Shape React APD   Right PERRL 3 2 Round Brisk None   Left PERRL 3 2 Round Brisk None         Visual Fields       Left Right    Full Full         Extraocular Movement       Right Left    Full, Ortho Full, Ortho         Neuro/Psych     Oriented x3: Yes   Mood/Affect: Normal         Dilation     Both eyes: 2.5% Phenylephrine @ 1:23 PM           Slit Lamp and Fundus Exam     Slit Lamp Exam       Right Left   Lids/Lashes Dermatochalasis - upper lid, Meibomian gland dysfunction Dermatochalasis - upper lid, Meibomian gland dysfunction   Conjunctiva/Sclera White and quiet White and quiet   Cornea Mild Arcus, Well healed temporal cataract wounds, trace Punctate epithelial erosions Mild Arcus, EBMD, trace Punctate epithelial erosions, Well healed temporal cataract wounds, mild Debris in tear film   Anterior Chamber Deep, 1+cell/pigment Deep and quiet   Iris Round and dilated, No NVI, PPM nasally Round and dilated, No NVI   Lens PC IOL in good position PC IOL in good position, trace PCO   Anterior Vitreous Vitreous syneresis, mild vitreous condensations, Posterior vitreous detachment, old, white VH settled inferiorly Vitreous syneresis         Fundus Exam       Right Left   Disc mild pallor, Sharp rim mild pallor, Sharp rim   C/D Ratio 0.4 0.4   Macula Flat, good foveal reflex, stable improvement in central edema, focal exudates and cystic changes temporal mac -- slightly improved, ERM temporal macula Flat, good foveal reflex, epiretinal membrane, punctate  exudates and edema temporal macula -- improved, minimal MA   Vessels attenuated, Tortuous attenuated, Tortuous   Periphery Attached, scattered MA's, inferior retina partially obscured by vitreous condensations / old VH attached, scattered DBH greatest nasally and IN,  focal exudates nasal midzone - improved           IMAGING AND PROCEDURES  Imaging and Procedures for 12/10/17  OCT, Retina - OU - Both Eyes       Right Eye Quality was good. Central Foveal Thickness: 295. Progression has been stable. Findings include no SRF, abnormal foveal contour, intraretinal hyper-reflective material, intraretinal fluid, outer retinal atrophy (Stable improvement in central IRF/IRHM -- just trace cystic changes remain IT macula).   Left Eye Quality was good. Central Foveal Thickness: 343. Progression has been stable. Findings include no SRF, abnormal foveal contour, intraretinal hyper-reflective material, epiretinal membrane, intraretinal fluid, macular pucker, vitreomacular adhesion (ERM; stable improvement in [facility] and cystic changes temporal mac).   Notes **Images taken, stored on drive**  Diagnosis / Impression:  DME OU (OD>OS) OD: Stable improvement in central IRF/IRHM -- just trace cystic changes remain IT macula  OS: ERM; stable improvment in IRF and cystic changes temporal mac  Clinical management:  See below  Abbreviations: NFP - Normal foveal profile. CME - cystoid macular edema. PED - pigment epithelial detachment. IRF - intraretinal fluid. SRF - subretinal fluid. EZ - ellipsoid zone. ERM - epiretinal membrane. ORA - outer retinal atrophy. ORT - outer retinal tubulation. SRHM - subretinal hyper-reflective material             ASSESSMENT/PLAN:   ICD-10-CM   1. Moderate nonproliferative diabetic retinopathy of both eyes with macular edema associated with type 2 diabetes mellitus (HCC)  E11.3313 OCT, Retina - OU - Both Eyes    2. Posterior vitreous detachment of right eye  H43.811      3. Essential hypertension  I10     4. Hypertensive retinopathy of both eyes  H35.033     5. Epiretinal membrane (ERM) of left eye  H35.372     6. Pseudophakia of both eyes  Z96.1      1. Moderate nonproliferative diabetic retinopathy, both eyes  - Diabetic macular edema, both eyes -- relatively mild  - S/P IVA OD #1 (12.17.18), #2 (01.28.19), #3 (02.27.19), #4 (04.03.19), #5 (05.01.19), #6 (05.27.20), #7 (07.08.20)  - in reviewing her case, there was minimal response to IVA in OCT or BCVA ===================================================================================  - S/P IVTA OD #1 (05.29.19), #2 (07.24.19), #3 (09.04.19), #4 (12.04.19), #5 (02.26.20), # 6 (08.05.20), # 7(09.30.20), #8 (12.23.20) ===================================================================================  - s/p IVE OD #1 (03.03.21), #2 (03.31.21), #3 (04.28.21), #4 (05.28.21), #5 (08.04.21), #6 (09.08.21), #7 (10.06.21), #8 (12.15.21), #9 (02.09.22), #10 (05.11.22), #11 (8.24.22), #12 (10.05.22), #13 (12.14.22), #14 (02.22.23), #15 (07.26.23), #16 (07.26.23), #17 (10.04.23), #18 (12.20.23), #19 (3.11.24)  - s/p IVE OS #1 (04.28.21), #2 (05.28.21), #3 (12.15.21), #4 (02.09.22), #5 (05.10.23), #6 (07.26.23), #7 (07.26.23), #8 (10.04.23), #9 (12.20.23), #10 (03.11.24)  - S/P focal laser OS (09.02.20)  - s/p focal laser OD (11.17.21)  - OCT shows OD: Stable improvement in central IRF/IRHM---just trace cystic changes remain IT macula; OS: ERM; stable improvment in IRF and cystic changes temporal mac at 14 wks  - BCVA OD 20/20  OS 20/25---stable OU at 14 weeks  - recommend holding injections today  - pt in agreement - IVE informed consent obtained and re-signed (07.26.23)  - f/u 3 months -- DFE/OCT possible injection(s)  2. PVD / vitreous syneresis OD  - Discussed findings and prognosis  - No RT or RD on 360 scleral depressed exam  - Reviewed signs/symptoms of RT/RD  - Strict return precautions for any such  RT/RD signs/symptoms   3. Epiretinal  membrane, left eye  - mild ERM with VMA/VMT component - BCVA remains 20/20 - still asymptomatic, no metamorphopsia - no indication for surgery at this time - continue to monitor  4,5. Hypertensive retinopathy OU  - discussed importance of tight BP control  -continue to monitor  6. Pseudophakia OU  - s/p CE/IOL OU (Dr. June Leap, March/June 2020)  - beautiful surgeries, doing well  - continue to monitor  Ophthalmic Meds Ordered this visit:  No orders of the defined types were placed in this encounter.    Return in about 3 months (around 04/29/2023) for f/u NPDR OU, DFE, OCT.  There are no Patient Instructions on file for this visit.  This document serves as a record of services personally performed by Karie Chimera, MD, PhD. It was created on their behalf by Gerilyn Nestle, COT an ophthalmic technician. The creation of this record is the provider's dictation and/or activities during the visit.    Electronically signed by:  Charlette Caffey, COT  01/27/23 9:09 PM  This document serves as a record of services personally performed by Karie Chimera, MD, PhD. It was created on their behalf by Glee Arvin. Manson Passey, OA an ophthalmic technician. The creation of this record is the provider's dictation and/or activities during the visit.    Electronically signed by: Glee Arvin. Manson Passey, [place] 06.18.2024 9:09 PM  Karie Chimera, M.D., Ph.D. Diseases & Surgery of the Retina and Vitreous [facility]  I have reviewed the above documentation for accuracy and completeness, and I agree with the above. Karie Chimera, M.D., Ph.D. 01/27/23 9:11 PM   Abbreviations: M myopia (nearsighted); A astigmatism; H hyperopia (farsighted); P presbyopia; Mrx spectacle prescription;  CTL contact lenses; OD right eye; OS left eye; OU both eyes  XT exotropia; ET esotropia; PEK punctate epithelial keratitis; PEE punctate epithelial erosions; DES dry eye  syndrome; MGD meibomian gland dysfunction; ATs artificial tears; PFAT's preservative free artificial tears; NSC nuclear sclerotic cataract; PSC posterior subcapsular cataract; ERM epi-retinal membrane; PVD posterior vitreous detachment; RD retinal detachment; DM diabetes mellitus; DR diabetic retinopathy; NPDR non-proliferative diabetic retinopathy; PDR proliferative diabetic retinopathy; CSME clinically significant macular edema; DME diabetic macular edema; dbh dot blot hemorrhages; CWS cotton wool spot; POAG primary open angle glaucoma; C/D cup-to-disc ratio; HVF humphrey visual field; GVF goldmann visual field; OCT optical coherence tomography; IOP intraocular pressure; BRVO Branch retinal vein occlusion; CRVO central retinal vein occlusion; CRAO central retinal artery occlusion; BRAO branch retinal artery occlusion; RT retinal tear; SB scleral buckle; PPV pars plana vitrectomy; VH Vitreous hemorrhage; PRP panretinal laser photocoagulation; IVK intravitreal kenalog; VMT vitreomacular traction; MH Macular hole;  NVD neovascularization of the disc; NVE neovascularization elsewhere; AREDS age related eye disease study; ARMD age related macular degeneration; POAG primary open angle glaucoma; EBMD epithelial/anterior basement membrane dystrophy; ACIOL anterior chamber intraocular lens; IOL intraocular lens; PCIOL posterior chamber intraocular lens; Phaco/IOL phacoemulsification with intraocular lens placement; PRK photorefractive keratectomy; LASIK laser assisted in situ keratomileusis; HTN hypertension; DM diabetes mellitus; COPD chronic obstructive pulmonary disease

## 2023-01-27 ENCOUNTER — Ambulatory Visit (INDEPENDENT_AMBULATORY_CARE_PROVIDER_SITE_OTHER): Payer: PPO | Admitting: Ophthalmology

## 2023-01-27 ENCOUNTER — Encounter (INDEPENDENT_AMBULATORY_CARE_PROVIDER_SITE_OTHER): Payer: Self-pay | Admitting: Ophthalmology

## 2023-01-27 DIAGNOSIS — E113313 Type 2 diabetes mellitus with moderate nonproliferative diabetic retinopathy with macular edema, bilateral: Secondary | ICD-10-CM

## 2023-01-27 DIAGNOSIS — Z961 Presence of intraocular lens: Secondary | ICD-10-CM

## 2023-01-27 DIAGNOSIS — H35033 Hypertensive retinopathy, bilateral: Secondary | ICD-10-CM | POA: Diagnosis not present

## 2023-01-27 DIAGNOSIS — I10 Essential (primary) hypertension: Secondary | ICD-10-CM

## 2023-01-27 DIAGNOSIS — H43811 Vitreous degeneration, right eye: Secondary | ICD-10-CM

## 2023-01-27 DIAGNOSIS — H35372 Puckering of macula, left eye: Secondary | ICD-10-CM | POA: Diagnosis not present

## 2023-01-30 DIAGNOSIS — Z0001 Encounter for general adult medical examination with abnormal findings: Secondary | ICD-10-CM | POA: Diagnosis not present

## 2023-01-30 DIAGNOSIS — R251 Tremor, unspecified: Secondary | ICD-10-CM | POA: Diagnosis not present

## 2023-01-30 DIAGNOSIS — I10 Essential (primary) hypertension: Secondary | ICD-10-CM | POA: Diagnosis not present

## 2023-01-30 DIAGNOSIS — E7801 Familial hypercholesterolemia: Secondary | ICD-10-CM | POA: Diagnosis not present

## 2023-01-30 DIAGNOSIS — E1169 Type 2 diabetes mellitus with other specified complication: Secondary | ICD-10-CM | POA: Diagnosis not present

## 2023-01-30 DIAGNOSIS — E7800 Pure hypercholesterolemia, unspecified: Secondary | ICD-10-CM | POA: Diagnosis not present

## 2023-02-06 DIAGNOSIS — Z23 Encounter for immunization: Secondary | ICD-10-CM | POA: Diagnosis not present

## 2023-02-06 DIAGNOSIS — Z6827 Body mass index (BMI) 27.0-27.9, adult: Secondary | ICD-10-CM | POA: Diagnosis not present

## 2023-02-06 DIAGNOSIS — I10 Essential (primary) hypertension: Secondary | ICD-10-CM | POA: Diagnosis not present

## 2023-02-06 DIAGNOSIS — E7801 Familial hypercholesterolemia: Secondary | ICD-10-CM | POA: Diagnosis not present

## 2023-02-06 DIAGNOSIS — Z0001 Encounter for general adult medical examination with abnormal findings: Secondary | ICD-10-CM | POA: Diagnosis not present

## 2023-02-06 DIAGNOSIS — E1169 Type 2 diabetes mellitus with other specified complication: Secondary | ICD-10-CM | POA: Diagnosis not present

## 2023-02-06 DIAGNOSIS — R251 Tremor, unspecified: Secondary | ICD-10-CM | POA: Diagnosis not present

## 2023-04-28 DIAGNOSIS — E1169 Type 2 diabetes mellitus with other specified complication: Secondary | ICD-10-CM | POA: Diagnosis not present

## 2023-04-28 DIAGNOSIS — E7801 Familial hypercholesterolemia: Secondary | ICD-10-CM | POA: Diagnosis not present

## 2023-04-28 DIAGNOSIS — I10 Essential (primary) hypertension: Secondary | ICD-10-CM | POA: Diagnosis not present

## 2023-04-28 NOTE — Progress Notes (Signed)
[facility] - Clinic Note  04/29/2023     CHIEF COMPLAINT Patient presents for Retina Follow Up    HISTORY OF PRESENT ILLNESS: Carolyn Hunter is a 72 y.o. female who presents to the clinic today for:   HPI     Retina Follow Up   Patient presents with  Diabetic Retinopathy.  In both eyes.  This started 3 months ago.  Duration of 3 months.  Since onset it is stable.  I, the attending physician,  performed the HPI with the patient and updated documentation appropriately.        Comments   3 month retina follow up pt is reporting no vision changes noticed she denies flashes floaters her last reading was 120 this am her last A1C 7.0 3 months ago       Last edited by Rennis Chris, MD on 04/29/2023  5:09 PM.    Pt states vision is the same, she states her blood sugar was a little high last time it was checked, but her BP has been okay  Referring physician: Toma Deiters, MD [address] [place],  [place] [address]  HISTORICAL INFORMATION:   Selected notes from the MEDICAL RECORD NUMBER Referral from Dr. Fabienne Bruns for DM exam   CURRENT MEDICATIONS: Current Outpatient Medications (Ophthalmic Drugs)  Medication Sig   Aflibercept (EYLEA IO) Inject 1 Dose into the eye every 30 (thirty) days. Injectable med given @ retina office q 12 wks.   No current facility-administered medications for this visit. (Ophthalmic Drugs)   Current Outpatient Medications (Other)  Medication Sig   amLODipine (NORVASC) 5 MG tablet TAKE 1 TABLET BY MOUTH EVERY DAY   Cholecalciferol (VITAMIN D3) 50 MCG (2000 UT) TABS Take 2,000 Units by mouth daily.   Liniments (SALONPAS PAIN RELIEF PATCH EX) Place 1 patch onto the skin daily as needed (pain.).   metFORMIN (GLUCOPHAGE) 1000 MG tablet TAKE 1 TABLET BY MOUTH EVERY DAY WITH BREAKFAST   propranolol ER (INDERAL LA) 60 MG 24 hr capsule TAKE 1 CAPSULE BY MOUTH EVERY DAY   rosuvastatin (CRESTOR) 40 MG tablet Take 1 tablet (40 mg total)  by mouth daily.   No current facility-administered medications for this visit. (Other)   REVIEW OF SYSTEMS: ROS   Positive for: Neurological, Endocrine, Eyes Negative for: Constitutional, Gastrointestinal, Skin, Genitourinary, Musculoskeletal, HENT, Cardiovascular, Respiratory, Psychiatric, Allergic/Imm, Heme/Lymph Last edited by Etheleen Mayhew, COT on 04/29/2023  1:27 PM.      ALLERGIES Allergies  Allergen Reactions   Ace Inhibitors Cough   Zetia [Ezetimibe] Swelling    Sore throat   PAST MEDICAL HISTORY Past Medical History:  Diagnosis Date   Anxiety    Cataract    OS   Diabetes mellitus without complication (HCC)    Diabetic retinopathy (HCC)    NPDR OU   Hyperlipidemia    Hypertension    Hypertensive retinopathy    OU   Tremors of nervous system    "just when I get in a nervous Situation".   Past Surgical History:  Procedure Laterality Date   CATARACT EXTRACTION W/PHACO Right 10/18/2018   Procedure: CATARACT EXTRACTION PHACO AND INTRAOCULAR LENS PLACEMENT (IOC);  Surgeon: Fabio Pierce, MD;  Location: [facility];  Service: Ophthalmology;  Laterality: Right;  CDE: 8.11   CATARACT EXTRACTION W/PHACO Left 01/17/2019   Procedure: CATARACT EXTRACTION PHACO AND INTRAOCULAR LENS PLACEMENT (IOC);  Surgeon: Fabio Pierce, MD;  Location: [facility];  Service: Ophthalmology;  Laterality: Left;  CDE: 6.38   CESAREAN SECTION     1988   COLONOSCOPY WITH PROPOFOL N/A 05/04/2020   Procedure: COLONOSCOPY WITH PROPOFOL;  Surgeon: Dolores Frame, MD;  Location: [facility];  Service: Gastroenterology;  Laterality: N/A;  830   EYE SURGERY Right    Cataract extraction OD   POLYPECTOMY  05/04/2020   Procedure: POLYPECTOMY;  Surgeon: Marguerita Merles, Reuel Boom, MD;  Location: [facility];  Service: Gastroenterology;;   FAMILY HISTORY Family History  Problem Relation Age of Onset   Cancer Mother        breast and colon cancer   Breast cancer Mother    Diabetes Brother     Diabetes Son    Asthma Father        COPD   Diabetes Son    SOCIAL HISTORY Social History   Tobacco Use   Smoking status: Never   Smokeless tobacco: Never  Vaping Use   Vaping status: Never Used  Substance Use Topics   Alcohol use: No   Drug use: No       OPHTHALMIC EXAM:  Base Eye Exam     Visual Acuity (Snellen - Linear)       Right Left   Dist [place] 20/20 -1 20/20 -1         Tonometry (Tonopen, 1:31 PM)       Right Left   Pressure 13 14         Pupils       Pupils Dark Light Shape React APD   Right PERRL 3 2 Round Brisk None   Left PERRL 3 2 Round Brisk None         Visual Fields       Left Right    Full Full         Extraocular Movement       Right Left    Full, Ortho Full, Ortho         Neuro/Psych     Oriented x3: Yes   Mood/Affect: Normal         Dilation     Both eyes: 2.5% Phenylephrine @ 1:31 PM           Slit Lamp and Fundus Exam     Slit Lamp Exam       Right Left   Lids/Lashes Dermatochalasis - upper lid, Meibomian gland dysfunction Dermatochalasis - upper lid, Meibomian gland dysfunction   Conjunctiva/Sclera White and quiet White and quiet   Cornea Mild Arcus, Well healed temporal cataract wounds, trace Punctate epithelial erosions Mild Arcus, EBMD, trace Punctate epithelial erosions, Well healed temporal cataract wounds, mild tear film debris   Anterior Chamber Deep, 1+cell/pigment Deep and quiet   Iris Round and dilated, No NVI, PPM nasally Round and dilated, No NVI   Lens PC IOL in good position PC IOL in good position, trace PCO   Anterior Vitreous Vitreous syneresis, mild vitreous condensations, Posterior vitreous detachment, old, white VH settled inferiorly Vitreous syneresis         Fundus Exam       Right Left   Disc mild pallor, Sharp rim mild pallor, Sharp rim   C/D Ratio 0.4 0.4   Macula Flat, good foveal reflex, stable improvement in central edema, punctate IRH, focal exudates and cystic  changes temporal mac -- slightly improved, ERM temporal macula Flat, good foveal reflex, epiretinal membrane, punctate exudates and edema temporal macula -- improved, minimal MA, no frank edema   Vessels attenuated,  Tortuous attenuated, Tortuous   Periphery Attached, scattered MA's, inferior retina partially obscured by vitreous condensations / old VH attached, scattered DBH greatest nasally and IN, focal exudates nasal midzone - improved           IMAGING AND PROCEDURES  Imaging and Procedures for 12/10/17  OCT, Retina - OU - Both Eyes       Right Eye Quality was good. Central Foveal Thickness: 314. Progression has been stable. Findings include normal foveal contour, no IRF, no SRF, intraretinal hyper-reflective material (Stable improvement in central IRF/IRHM -- just trace cystic changes remain IT macula).   Left Eye Quality was good. Central Foveal Thickness: 343. Progression has been stable. Findings include no SRF, abnormal foveal contour, intraretinal hyper-reflective material, epiretinal membrane, intraretinal fluid, macular pucker, vitreomacular adhesion (ERM with blunting of foveal contour; stable improvement in [facility] and cystic changes temporal mac).   Notes **Images taken, stored on drive**  Diagnosis / Impression:  DME OU (OD>OS) OD: Stable improvement in central IRF/IRHM -- just trace cystic changes remain IT macula  OS: ERM with blunting of foveal contour; stable improvement in [facility] and cystic changes temporal mac  Clinical management:  See below  Abbreviations: NFP - Normal foveal profile. CME - cystoid macular edema. PED - pigment epithelial detachment. IRF - intraretinal fluid. SRF - subretinal fluid. EZ - ellipsoid zone. ERM - epiretinal membrane. ORA - outer retinal atrophy. ORT - outer retinal tubulation. SRHM - subretinal hyper-reflective material             ASSESSMENT/PLAN:   ICD-10-CM   1. 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 Posterior vitreous detachment of right eye  H43.811     3. Essential hypertension  I10     4. Hypertensive retinopathy of both eyes  H35.033     5. Epiretinal membrane (ERM) of left eye  H35.372     6. Pseudophakia of both eyes  Z96.1      1. Moderate nonproliferative diabetic retinopathy, both eyes  - Diabetic macular edema, both eyes -- relatively mild  - S/P IVA OD #1 (12.17.18), #2 (01.28.19), #3 (02.27.19), #4 (04.03.19), #5 (05.01.19), #6 (05.27.20), #7 (07.08.20)  - in reviewing her case, there was minimal response to IVA in OCT or BCVA ===================================================================================  - S/P IVTA OD #1 (05.29.19), #2 (07.24.19), #3 (09.04.19), #4 (12.04.19), #5 (02.26.20), # 6 (08.05.20), # 7(09.30.20), #8 (12.23.20) ===================================================================================  - s/p IVE OD #1 (03.03.21), #2 (03.31.21), #3 (04.28.21), #4 (05.28.21), #5 (08.04.21), #6 (09.08.21), #7 (10.06.21), #8 (12.15.21), #9 (02.09.22), #10 (05.11.22), #11 (8.24.22), #12 (10.05.22), #13 (12.14.22), #14 (02.22.23), #15 (07.26.23), #16 (07.26.23), #17 (10.04.23), #18 (12.20.23), #19 (3.11.24)  - s/p IVE OS #1 (04.28.21), #2 (05.28.21), #3 (12.15.21), #4 (02.09.22), #5 (05.10.23), #6 (07.26.23), #7 (07.26.23), #8 (10.04.23), #9 (12.20.23), #10 (03.11.24)  - S/P focal laser OS (09.02.20)  - s/p focal laser OD (11.17.21)  - OCT shows OD: Stable improvement in central IRF/IRHM -- just trace cystic changes remain IT macula; OS: ERM with blunting of foveal contour; stable improvement in [facility] and cystic changes temporal mac at 6 months since last injections  - BCVA 20/20 OU -- stable  - recommend holding injections again today  - pt in agreement - IVE informed consent obtained and re-signed (07.26.23)  - f/u 3 months, sooner prn -- DFE/OCT  possible injection(s)  2. PVD /  vitreous syneresis OD  - Discussed findings and prognosis  - No RT or RD on 360 scleral depressed exam  - Reviewed signs/symptoms of RT/RD  - Strict return precautions for any such RT/RD signs/symptoms   3. Epiretinal membrane, left eye  - mild ERM with VMA/VMT component - BCVA remains 20/20 - still asymptomatic, no metamorphopsia - no indication for surgery at this time - continue to monitor  4,5. Hypertensive retinopathy OU  - discussed importance of tight BP control  - continue to monitor  6. Pseudophakia OU  - s/p CE/IOL OU (Dr. June Leap, March/June 2020)  - beautiful surgeries, doing well  - continue to monitor  Ophthalmic Meds Ordered this visit:  No orders of the defined types were placed in this encounter.    Return in about 3 months (around 07/29/2023) for f/u NPDR OU, DFE, OCT.  There are no Patient Instructions on file for this visit.  This document serves as a record of services personally performed by Karie Chimera, MD, PhD. It was created on their behalf by Glee Arvin. Manson Passey, OA an ophthalmic technician. The creation of this record is the provider's dictation and/or activities during the visit.    Electronically signed by: Glee Arvin. Manson Passey, OA 04/29/23 5:10 PM  Karie Chimera, M.D., Ph.D. Diseases & Surgery of the Retina and Vitreous [facility]  I have reviewed the above documentation for accuracy and completeness, and I agree with the above. Karie Chimera, M.D., Ph.D. 04/29/23 5:12 PM  Abbreviations: M myopia (nearsighted); A astigmatism; H hyperopia (farsighted); P presbyopia; Mrx spectacle prescription;  CTL contact lenses; OD right eye; OS left eye; OU both eyes  XT exotropia; ET esotropia; PEK punctate epithelial keratitis; PEE punctate epithelial erosions; DES dry eye syndrome; MGD meibomian gland dysfunction; ATs artificial tears; PFAT's preservative free artificial tears; NSC nuclear sclerotic  cataract; PSC posterior subcapsular cataract; ERM epi-retinal membrane; PVD posterior vitreous detachment; RD retinal detachment; DM diabetes mellitus; DR diabetic retinopathy; NPDR non-proliferative diabetic retinopathy; PDR proliferative diabetic retinopathy; CSME clinically significant macular edema; DME diabetic macular edema; dbh dot blot hemorrhages; CWS cotton wool spot; POAG primary open angle glaucoma; C/D cup-to-disc ratio; HVF humphrey visual field; GVF goldmann visual field; OCT optical coherence tomography; IOP intraocular pressure; BRVO Branch retinal vein occlusion; CRVO central retinal vein occlusion; CRAO central retinal artery occlusion; BRAO branch retinal artery occlusion; RT retinal tear; SB scleral buckle; PPV pars plana vitrectomy; VH Vitreous hemorrhage; PRP panretinal laser photocoagulation; IVK intravitreal kenalog; VMT vitreomacular traction; MH Macular hole;  NVD neovascularization of the disc; NVE neovascularization elsewhere; AREDS age related eye disease study; ARMD age related macular degeneration; POAG primary open angle glaucoma; EBMD epithelial/anterior basement membrane dystrophy; ACIOL anterior chamber intraocular lens; IOL intraocular lens; PCIOL posterior chamber intraocular lens; Phaco/IOL phacoemulsification with intraocular lens placement; PRK photorefractive keratectomy; LASIK laser assisted in situ keratomileusis; HTN hypertension; DM diabetes mellitus; COPD chronic obstructive pulmonary disease

## 2023-04-29 ENCOUNTER — Ambulatory Visit (INDEPENDENT_AMBULATORY_CARE_PROVIDER_SITE_OTHER): Payer: PPO | Admitting: Ophthalmology

## 2023-04-29 ENCOUNTER — Encounter (INDEPENDENT_AMBULATORY_CARE_PROVIDER_SITE_OTHER): Payer: Self-pay | Admitting: Ophthalmology

## 2023-04-29 DIAGNOSIS — E113313 Type 2 diabetes mellitus with moderate nonproliferative diabetic retinopathy with macular edema, bilateral: Secondary | ICD-10-CM

## 2023-04-29 DIAGNOSIS — H35372 Puckering of macula, left eye: Secondary | ICD-10-CM | POA: Diagnosis not present

## 2023-04-29 DIAGNOSIS — Z961 Presence of intraocular lens: Secondary | ICD-10-CM | POA: Diagnosis not present

## 2023-04-29 DIAGNOSIS — H35033 Hypertensive retinopathy, bilateral: Secondary | ICD-10-CM | POA: Diagnosis not present

## 2023-04-29 DIAGNOSIS — H43811 Vitreous degeneration, right eye: Secondary | ICD-10-CM

## 2023-04-29 DIAGNOSIS — I10 Essential (primary) hypertension: Secondary | ICD-10-CM

## 2023-05-05 DIAGNOSIS — E7801 Familial hypercholesterolemia: Secondary | ICD-10-CM | POA: Diagnosis not present

## 2023-05-05 DIAGNOSIS — Z6827 Body mass index (BMI) 27.0-27.9, adult: Secondary | ICD-10-CM | POA: Diagnosis not present

## 2023-05-05 DIAGNOSIS — E1169 Type 2 diabetes mellitus with other specified complication: Secondary | ICD-10-CM | POA: Diagnosis not present

## 2023-05-05 DIAGNOSIS — R251 Tremor, unspecified: Secondary | ICD-10-CM | POA: Diagnosis not present

## 2023-05-05 DIAGNOSIS — I10 Essential (primary) hypertension: Secondary | ICD-10-CM | POA: Diagnosis not present

## 2023-06-11 DIAGNOSIS — Z1231 Encounter for screening mammogram for malignant neoplasm of breast: Secondary | ICD-10-CM | POA: Diagnosis not present

## 2023-07-06 ENCOUNTER — Other Ambulatory Visit: Payer: Self-pay | Admitting: Family Medicine

## 2023-07-06 DIAGNOSIS — E119 Type 2 diabetes mellitus without complications: Secondary | ICD-10-CM

## 2023-07-23 NOTE — Progress Notes (Addendum)
[facility] - Clinic Note  07/29/2023     CHIEF COMPLAINT Patient presents for Retina Follow Up    HISTORY OF PRESENT ILLNESS: Carolyn Hunter is a 72 y.o. female who presents to the clinic today for:   HPI     Retina Follow Up   Patient presents with  Diabetic Retinopathy.  In both eyes.  This started 3 months ago.  Duration of 3 months.  Since onset it is stable.  I, the attending physician,  performed the HPI with the patient and updated documentation appropriately.        Comments   3 month retina follow up NPDR OU pt is reporting no vision changes noticed she denies any flashes or floaters her last reading was 140 this am her last A1C was 7 she goes on Friday       Last edited by Rennis Chris, MD on 07/29/2023  4:22 PM.     Referring physician: Toma Deiters, MD [address] [place],  [place] [address]  HISTORICAL INFORMATION:   Selected notes from the MEDICAL RECORD NUMBER Referral from Dr. Fabienne Bruns for DM exam   CURRENT MEDICATIONS: Current Outpatient Medications (Ophthalmic Drugs)  Medication Sig   Aflibercept (EYLEA IO) Inject 1 Dose into the eye every 30 (thirty) days. Injectable med given @ retina office q 12 wks.   No current facility-administered medications for this visit. (Ophthalmic Drugs)   Current Outpatient Medications (Other)  Medication Sig   amLODipine (NORVASC) 5 MG tablet TAKE 1 TABLET BY MOUTH EVERY DAY   Cholecalciferol (VITAMIN D3) 50 MCG (2000 UT) TABS Take 2,000 Units by mouth daily.   Liniments (SALONPAS PAIN RELIEF PATCH EX) Place 1 patch onto the skin daily as needed (pain.).   metFORMIN (GLUCOPHAGE) 1000 MG tablet TAKE 1 TABLET BY MOUTH EVERY DAY WITH BREAKFAST   propranolol ER (INDERAL LA) 60 MG 24 hr capsule TAKE 1 CAPSULE BY MOUTH EVERY DAY   rosuvastatin (CRESTOR) 40 MG tablet Take 1 tablet (40 mg total) by mouth daily.   No current facility-administered medications for this visit. (Other)   REVIEW OF  SYSTEMS: ROS   Positive for: Neurological, Endocrine, Eyes Negative for: Constitutional, Gastrointestinal, Skin, Genitourinary, Musculoskeletal, HENT, Cardiovascular, Respiratory, Psychiatric, Allergic/Imm, Heme/Lymph Last edited by Etheleen Mayhew, COT on 07/29/2023  1:24 PM.     ALLERGIES Allergies  Allergen Reactions   Ace Inhibitors Cough   Zetia [Ezetimibe] Swelling    Sore throat   PAST MEDICAL HISTORY Past Medical History:  Diagnosis Date   Anxiety    Cataract    OS   Diabetes mellitus without complication (HCC)    Diabetic retinopathy (HCC)    NPDR OU   Hyperlipidemia    Hypertension    Hypertensive retinopathy    OU   Tremors of nervous system    "just when I get in a nervous Situation".   Past Surgical History:  Procedure Laterality Date   CATARACT EXTRACTION W/PHACO Right 10/18/2018   Procedure: CATARACT EXTRACTION PHACO AND INTRAOCULAR LENS PLACEMENT (IOC);  Surgeon: Fabio Pierce, MD;  Location: [facility];  Service: Ophthalmology;  Laterality: Right;  CDE: 8.11   CATARACT EXTRACTION W/PHACO Left 01/17/2019   Procedure: CATARACT EXTRACTION PHACO AND INTRAOCULAR LENS PLACEMENT (IOC);  Surgeon: Fabio Pierce, MD;  Location: [facility];  Service: Ophthalmology;  Laterality: Left;  CDE: 6.38   CESAREAN SECTION     1988   COLONOSCOPY WITH PROPOFOL N/A 05/04/2020  Procedure: COLONOSCOPY WITH PROPOFOL;  Surgeon: Dolores Frame, MD;  Location: [facility];  Service: Gastroenterology;  Laterality: N/A;  830   EYE SURGERY Right    Cataract extraction OD   POLYPECTOMY  05/04/2020   Procedure: POLYPECTOMY;  Surgeon: Marguerita Merles, Reuel Boom, MD;  Location: [facility];  Service: Gastroenterology;;   FAMILY HISTORY Family History  Problem Relation Age of Onset   Cancer Mother        breast and colon cancer   Breast cancer Mother    Diabetes Brother    Diabetes Son    Asthma Father        COPD   Diabetes Son    SOCIAL HISTORY Social History    Tobacco Use   Smoking status: Never   Smokeless tobacco: Never  Vaping Use   Vaping status: Never Used  Substance Use Topics   Alcohol use: No   Drug use: No       OPHTHALMIC EXAM:  Base Eye Exam     Visual Acuity (Snellen - Linear)       Right Left   Dist [place] 20/25 +1 20/20   Dist ph [place] NI NI         Tonometry (Tonopen, 1:28 PM)       Right Left   Pressure 12 12         Pupils       Pupils Dark Light Shape React APD   Right PERRL 3 2 Round Brisk None   Left PERRL 3 2 Round Brisk None         Visual Fields       Left Right    Full Full         Extraocular Movement       Right Left    Full, Ortho Full, Ortho         Neuro/Psych     Oriented x3: Yes   Mood/Affect: Normal         Dilation     Both eyes: 2.5% Phenylephrine @ 1:25 PM           Slit Lamp and Fundus Exam     Slit Lamp Exam       Right Left   Lids/Lashes Dermatochalasis - upper lid, Meibomian gland dysfunction Dermatochalasis - upper lid, Meibomian gland dysfunction   Conjunctiva/Sclera White and quiet White and quiet   Cornea Mild Arcus, Well healed temporal cataract wounds, trace Punctate epithelial erosions Mild Arcus, EBMD, trace Punctate epithelial erosions, Well healed temporal cataract wounds, mild tear film debris   Anterior Chamber Deep, 1+cell/pigment Deep and quiet   Iris Round and dilated, No NVI, PPM nasally Round and dilated, No NVI   Lens PC IOL in good position PC IOL in good position, trace PCO   Anterior Vitreous Vitreous syneresis, mild vitreous condensations, Posterior vitreous detachment, old, white VH settled inferiorly Vitreous syneresis         Fundus Exam       Right Left   Disc mild pallor, Sharp rim mild pallor, Sharp rim   C/D Ratio 0.4 0.4   Macula Flat, good foveal reflex, Mild interval increase in IRF inferotemporal fovea and mac, punctate IRH, focal exudates and cystic changes temporal mac, ERM temporal macula Flat, good foveal  reflex, epiretinal membrane, punctate exudates and edema temporal macula -- improved, minimal MA, no frank edema   Vessels attenuated, Tortuous attenuated, Tortuous   Periphery Attached, scattered MA's, old VH attached, scattered  DBH greatest nasally and IN, focal exudates nasal midzone - improved           IMAGING AND PROCEDURES  Imaging and Procedures for 12/10/17  OCT, Retina - OU - Both Eyes       Right Eye Quality was good. Central Foveal Thickness: 314. Progression has worsened. Findings include normal foveal contour, no IRF, no SRF, intraretinal hyper-reflective material (Mild interval increase in IRF inferotemporal fovea and mac).   Left Eye Quality was good. Central Foveal Thickness: 343. Progression has been stable. Findings include no SRF, abnormal foveal contour, intraretinal hyper-reflective material, epiretinal membrane, intraretinal fluid, macular pucker, vitreomacular adhesion (ERM with blunting of foveal contour; stable improvement in [facility] and cystic changes temporal mac).   Notes **Images taken, stored on drive**  Diagnosis / Impression:  DME OU (OD>OS) OD: Mild interval increase in IRF inferotemporal fovea and mac OS: ERM with blunting of foveal contour; stable improvement in [facility] and cystic changes temporal mac  Clinical management:  See below  Abbreviations: NFP - Normal foveal profile. CME - cystoid macular edema. PED - pigment epithelial detachment. IRF - intraretinal fluid. SRF - subretinal fluid. EZ - ellipsoid zone. ERM - epiretinal membrane. ORA - outer retinal atrophy. ORT - outer retinal tubulation. SRHM - subretinal hyper-reflective material       Intravitreal Injection, Pharmacologic Agent - OD - Right Eye       Time Out 07/29/2023. 2:02 PM. Confirmed correct patient, procedure, site, and patient consented.   Anesthesia Topical anesthesia was used. Anesthetic medications included Lidocaine 2%, Proparacaine 0.5%.   Procedure Preparation  included 5% betadine to ocular surface, eyelid speculum. A (32g) needle was used.   Injection: 2 mg aflibercept 2 MG/0.05ML   Route: Intravitreal, Site: Right Eye   NDC: L6038910, Lot: [id number], Expiration date: 01/09/2024, Waste: 0 mL   Post-op Post injection exam found visual acuity of at least counting fingers. The patient tolerated the procedure well. There were no complications. The patient received written and verbal post procedure care education. Post injection medications were not given.            ASSESSMENT/PLAN:   ICD-10-CM   1. 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 Posterior vitreous detachment of right eye  H43.811     3. Essential hypertension  I10     4. Hypertensive retinopathy of both eyes  H35.033     5. Epiretinal membrane (ERM) of left eye  H35.372     6. Pseudophakia of both eyes  Z96.1       1. Moderate nonproliferative diabetic retinopathy, both eyes  - Diabetic macular edema, both eyes -- relatively mild  - S/P IVA OD #1 (12.17.18), #2 (01.28.19), #3 (02.27.19), #4 (04.03.19), #5 (05.01.19), #6 (05.27.20), #7 (07.08.20)  - in reviewing her case, there was minimal response to IVA in OCT or BCVA ===================================================================================  - S/P IVTA OD #1 (05.29.19), #2 (07.24.19), #3 (09.04.19), #4 (12.04.19), #5 (02.26.20), # 6 (08.05.20), # 7(09.30.20), #8 (12.23.20) ===================================================================================  - s/p IVE OD #1 (03.03.21), #2 (03.31.21), #3 (04.28.21), #4 (05.28.21), #5 (08.04.21), #6 (09.08.21), #7 (10.06.21), #8 (12.15.21), #9 (02.09.22), #10 (05.11.22), #11 (8.24.22), #12 (10.05.22), #13 (12.14.22), #14 (02.22.23), #15  (07.26.23), #16 (07.26.23), #17 (10.04.23), #18 (12.20.23), #19 (  3.11.24)  - s/p IVE OS #1 (04.28.21), #2 (05.28.21), #3 (12.15.21), #4 (02.09.22), #5 (05.10.23), #6 (07.26.23), #7 (07.26.23), #8 (10.04.23), #9 (12.20.23), #10 (03.11.24)  - S/P focal laser OS (09.02.20)  - s/p focal laser OD (11.17.21)  - OCT shows OD: Mild interval increase in IRF inferotemporal fovea and mac; OS: ERM with blunting of foveal contour; stable improvement in [facility] and cystic changes temporal mac at 9 months since last injections  - BCVA 20/20 OU -- stable  - recommend IVE OD #20 today, 12.18.24 w/ f/u in 3 mos   - pt in agreement - RBA of procedure discussed, questions answered - IVE informed consent obtained and re-signed (12.18.24) - see procedure note   - f/u 3 months, sooner prn -- DFE/OCT possible injection(s)  2. PVD / vitreous syneresis OD  - Discussed findings and prognosis  - No RT or RD on 360 scleral depressed exam  - Reviewed signs/symptoms of RT/RD  - Strict return precautions for any such RT/RD signs/symptoms   3. Epiretinal membrane, left eye  - mild ERM with VMA/VMT component - BCVA remains 20/20 - still asymptomatic, no metamorphopsia - no indication for surgery at this time - continue to monitor  4,5. Hypertensive retinopathy OU  - discussed importance of tight BP control  - continue to monitor  6. Pseudophakia OU  - s/p CE/IOL OU (Dr. June Leap, March/June 2020)  - beautiful surgeries, doing well  - continue to monitor  Ophthalmic Meds Ordered this visit:  Meds ordered this encounter  Medications   aflibercept (EYLEA) SOLN 2 mg     Return in about 3 months (around 10/27/2023) for NPDR OU, DFE, OCT, poss inj.  There are no Patient Instructions on file for this visit.  This document serves as a record of services personally performed by Karie Chimera, MD, PhD. It was created on their behalf by De Blanch, an ophthalmic technician. The creation of this record is the  provider's dictation and/or activities during the visit.    Electronically signed by: De Blanch, OA, 07/29/23  4:25 PM  Karie Chimera, M.D., Ph.D. Diseases & Surgery of the Retina and Vitreous [facility]  I have reviewed the above documentation for accuracy and completeness, and I agree with the above. Karie Chimera, M.D., Ph.D. 07/29/23 4:25 PM   Abbreviations: M myopia (nearsighted); A astigmatism; H hyperopia (farsighted); P presbyopia; Mrx spectacle prescription;  CTL contact lenses; OD right eye; OS left eye; OU both eyes  XT exotropia; ET esotropia; PEK punctate epithelial keratitis; PEE punctate epithelial erosions; DES dry eye syndrome; MGD meibomian gland dysfunction; ATs artificial tears; PFAT's preservative free artificial tears; NSC nuclear sclerotic cataract; PSC posterior subcapsular cataract; ERM epi-retinal membrane; PVD posterior vitreous detachment; RD retinal detachment; DM diabetes mellitus; DR diabetic retinopathy; NPDR non-proliferative diabetic retinopathy; PDR proliferative diabetic retinopathy; CSME clinically significant macular edema; DME diabetic macular edema; dbh dot blot hemorrhages; CWS cotton wool spot; POAG primary open angle glaucoma; C/D cup-to-disc ratio; HVF humphrey visual field; GVF goldmann visual field; OCT optical coherence tomography; IOP intraocular pressure; BRVO Branch retinal vein occlusion; CRVO central retinal vein occlusion; CRAO central retinal artery occlusion; BRAO branch retinal artery occlusion; RT retinal tear; SB scleral buckle; PPV pars plana vitrectomy; VH Vitreous hemorrhage; PRP panretinal laser photocoagulation; IVK intravitreal kenalog; VMT vitreomacular traction; MH Macular hole;  NVD neovascularization of the disc; NVE neovascularization elsewhere; AREDS age related eye disease study; ARMD age related macular degeneration; POAG primary open  angle glaucoma; EBMD epithelial/anterior basement membrane  dystrophy; ACIOL anterior chamber intraocular lens; IOL intraocular lens; PCIOL posterior chamber intraocular lens; Phaco/IOL phacoemulsification with intraocular lens placement; PRK photorefractive keratectomy; LASIK laser assisted in situ keratomileusis; HTN hypertension; DM diabetes mellitus; COPD chronic obstructive pulmonary disease

## 2023-07-27 DIAGNOSIS — I10 Essential (primary) hypertension: Secondary | ICD-10-CM | POA: Diagnosis not present

## 2023-07-27 DIAGNOSIS — E1169 Type 2 diabetes mellitus with other specified complication: Secondary | ICD-10-CM | POA: Diagnosis not present

## 2023-07-27 DIAGNOSIS — E7801 Familial hypercholesterolemia: Secondary | ICD-10-CM | POA: Diagnosis not present

## 2023-07-29 ENCOUNTER — Ambulatory Visit (INDEPENDENT_AMBULATORY_CARE_PROVIDER_SITE_OTHER): Payer: PPO | Admitting: Ophthalmology

## 2023-07-29 ENCOUNTER — Encounter (INDEPENDENT_AMBULATORY_CARE_PROVIDER_SITE_OTHER): Payer: Self-pay | Admitting: Ophthalmology

## 2023-07-29 DIAGNOSIS — E113313 Type 2 diabetes mellitus with moderate nonproliferative diabetic retinopathy with macular edema, bilateral: Secondary | ICD-10-CM | POA: Diagnosis not present

## 2023-07-29 DIAGNOSIS — H35033 Hypertensive retinopathy, bilateral: Secondary | ICD-10-CM

## 2023-07-29 DIAGNOSIS — I10 Essential (primary) hypertension: Secondary | ICD-10-CM

## 2023-07-29 DIAGNOSIS — H35372 Puckering of macula, left eye: Secondary | ICD-10-CM

## 2023-07-29 DIAGNOSIS — H43811 Vitreous degeneration, right eye: Secondary | ICD-10-CM | POA: Diagnosis not present

## 2023-07-29 DIAGNOSIS — Z961 Presence of intraocular lens: Secondary | ICD-10-CM | POA: Diagnosis not present

## 2023-07-29 MED ORDER — AFLIBERCEPT 2MG/0.05ML IZ SOLN FOR KALEIDOSCOPE
2.0000 mg | INTRAVITREAL | Status: AC | PRN
  Administered 2023-07-29: 2 mg via INTRAVITREAL

## 2023-07-31 DIAGNOSIS — E1169 Type 2 diabetes mellitus with other specified complication: Secondary | ICD-10-CM | POA: Diagnosis not present

## 2023-07-31 DIAGNOSIS — E7801 Familial hypercholesterolemia: Secondary | ICD-10-CM | POA: Diagnosis not present

## 2023-07-31 DIAGNOSIS — Z6827 Body mass index (BMI) 27.0-27.9, adult: Secondary | ICD-10-CM | POA: Diagnosis not present

## 2023-07-31 DIAGNOSIS — I10 Essential (primary) hypertension: Secondary | ICD-10-CM | POA: Diagnosis not present

## 2023-07-31 DIAGNOSIS — R251 Tremor, unspecified: Secondary | ICD-10-CM | POA: Diagnosis not present

## 2023-10-15 NOTE — Progress Notes (Signed)
 [facility] - Clinic Note  10/28/2023     CHIEF COMPLAINT Patient presents for Retina Follow Up    HISTORY OF PRESENT ILLNESS: Carolyn Hunter is a 73 y.o. female who presents to the clinic today for:   HPI     Retina Follow Up   Patient presents with  Diabetic Retinopathy.  In both eyes.  Severity is moderate.  Duration of 3 months.  Since onset it is stable.  I, the attending physician,  performed the HPI with the patient and updated documentation appropriately.        Comments   3 month retina follow up NPDR OU. Pt states no changes in vision OU. Pt denies any discomfort. Pt denies any flashes or floaters. BS was 124 this morning. A1c is 8 taken in January and she has her next visit tomorrow. Pt does not use any type of drops.      Last edited by Rennis Chris, MD on 10/28/2023  2:26 PM.    Pt states vision is good  Referring physician: Toma Deiters, MD [address] [place],  [place] [address]  HISTORICAL INFORMATION:   Selected notes from the MEDICAL RECORD NUMBER Referral from Dr. Fabienne Bruns for DM exam   CURRENT MEDICATIONS: Current Outpatient Medications (Ophthalmic Drugs)  Medication Sig   Aflibercept (EYLEA IO) Inject 1 Dose into the eye every 30 (thirty) days. Injectable med given @ retina office q 12 wks.   No current facility-administered medications for this visit. (Ophthalmic Drugs)   Current Outpatient Medications (Other)  Medication Sig   amLODipine (NORVASC) 5 MG tablet TAKE 1 TABLET BY MOUTH EVERY DAY   Cholecalciferol (VITAMIN D3) 50 MCG (2000 UT) TABS Take 2,000 Units by mouth daily.   Liniments (SALONPAS PAIN RELIEF PATCH EX) Place 1 patch onto the skin daily as needed (pain.).   metFORMIN (GLUCOPHAGE) 1000 MG tablet TAKE 1 TABLET BY MOUTH EVERY DAY WITH BREAKFAST   propranolol ER (INDERAL LA) 60 MG 24 hr capsule TAKE 1 CAPSULE BY MOUTH EVERY DAY   rosuvastatin (CRESTOR) 40 MG tablet Take 1 tablet (40 mg total) by mouth  daily.   No current facility-administered medications for this visit. (Other)   REVIEW OF SYSTEMS: ROS   Positive for: Neurological, Endocrine, Eyes Negative for: Constitutional, Gastrointestinal, Skin, Genitourinary, Musculoskeletal, HENT, Cardiovascular, Respiratory, Psychiatric, Allergic/Imm, Heme/Lymph Last edited by Elicia Lamp, COT on 10/28/2023  1:48 PM.      ALLERGIES Allergies  Allergen Reactions   Ace Inhibitors Cough   Zetia [Ezetimibe] Swelling    Sore throat   PAST MEDICAL HISTORY Past Medical History:  Diagnosis Date   Anxiety    Cataract    OS   Diabetes mellitus without complication (HCC)    Diabetic retinopathy (HCC)    NPDR OU   Hyperlipidemia    Hypertension    Hypertensive retinopathy    OU   Tremors of nervous system    "just when I get in a nervous Situation".   Past Surgical History:  Procedure Laterality Date   CATARACT EXTRACTION W/PHACO Right 10/18/2018   Procedure: CATARACT EXTRACTION PHACO AND INTRAOCULAR LENS PLACEMENT (IOC);  Surgeon: Fabio Pierce, MD;  Location: [facility];  Service: Ophthalmology;  Laterality: Right;  CDE: 8.11   CATARACT EXTRACTION W/PHACO Left 01/17/2019   Procedure: CATARACT EXTRACTION PHACO AND INTRAOCULAR LENS PLACEMENT (IOC);  Surgeon: Fabio Pierce, MD;  Location: [facility];  Service: Ophthalmology;  Laterality: Left;  CDE: 6.38  CESAREAN SECTION     1988   COLONOSCOPY WITH PROPOFOL N/A 05/04/2020   Procedure: COLONOSCOPY WITH PROPOFOL;  Surgeon: Dolores Frame, MD;  Location: [facility];  Service: Gastroenterology;  Laterality: N/A;  830   EYE SURGERY Right    Cataract extraction OD   POLYPECTOMY  05/04/2020   Procedure: POLYPECTOMY;  Surgeon: Marguerita Merles, Reuel Boom, MD;  Location: [facility];  Service: Gastroenterology;;   FAMILY HISTORY Family History  Problem Relation Age of Onset   Cancer Mother        breast and colon cancer   Breast cancer Mother    Diabetes Brother    Diabetes Son     Asthma Father        COPD   Diabetes Son    SOCIAL HISTORY Social History   Tobacco Use   Smoking status: Never   Smokeless tobacco: Never  Vaping Use   Vaping status: Never Used  Substance Use Topics   Alcohol use: No   Drug use: No       OPHTHALMIC EXAM:  Base Eye Exam     Visual Acuity (Snellen - Linear)       Right Left   Dist [place] 20/25 -2 20/20 -2   Dist ph [place] 20/20 -2          Tonometry (Tonopen, 1:52 PM)       Right Left   Pressure 16 14         Pupils       Dark Light Shape React APD   Right 3 2 Round Brisk None   Left 3 2 Round Brisk None         Visual Fields       Left Right    Full Full         Extraocular Movement       Right Left    Full, Ortho Full, Ortho         Neuro/Psych     Oriented x3: Yes   Mood/Affect: Normal         Dilation     Both eyes: 1.0% Mydriacyl, 2.5% Phenylephrine @ 1:54 PM           Slit Lamp and Fundus Exam     Slit Lamp Exam       Right Left   Lids/Lashes Dermatochalasis - upper lid, Meibomian gland dysfunction Dermatochalasis - upper lid, Meibomian gland dysfunction   Conjunctiva/Sclera White and quiet White and quiet   Cornea Mild Arcus, Well healed temporal cataract wounds, mild tear film debris Mild Arcus, EBMD, trace Punctate epithelial erosions, Well healed temporal cataract wounds, mild tear film debris   Anterior Chamber Deep, 1+cell/pigment Deep and quiet   Iris Round and moderately dilated, No NVI, PPM nasally Round and dilated, No NVI   Lens PC IOL in good position, trace Posterior capsular opacification PC IOL in good position, trace PCO   Anterior Vitreous Vitreous syneresis, mild vitreous condensations, Posterior vitreous detachment, old, white VH settled inferiorly Vitreous syneresis         Fundus Exam       Right Left   Disc mild pallor, Sharp rim mild pallor, Sharp rim   C/D Ratio 0.4 0.4   Macula Flat, good foveal reflex, interval improvement in cystic  changes / edema inferotemporal fovea and mac, punctate MA and exudates IT mac, ERM temporal macula Flat, good foveal reflex, epiretinal membrane, punctate exudates and edema temporal macula -- stably improved,  minimal MA, no frank edema   Vessels attenuated, Tortuous attenuated, Tortuous   Periphery Attached, scattered MA's, old VH attached, scattered DBH greatest nasally and IN, focal exudates nasal midzone - improved           IMAGING AND PROCEDURES  Imaging and Procedures for 12/10/17  OCT, Retina - OU - Both Eyes       Right Eye Quality was good. Central Foveal Thickness: 298. Progression has improved. Findings include normal foveal contour, no SRF, intraretinal hyper-reflective material, intraretinal fluid (interval improvement in IRF inferotemporal fovea and mac).   Left Eye Quality was good. Central Foveal Thickness: 343. Progression has been stable. Findings include no SRF, abnormal foveal contour, intraretinal hyper-reflective material, epiretinal membrane, intraretinal fluid, macular pucker, vitreomacular adhesion (ERM with blunting of foveal contour; stable improvement in [facility] and cystic changes temporal mac).   Notes **Images taken, stored on drive**  Diagnosis / Impression:  DME OU (OD>OS) OD: interval improvement in  IRF inferotemporal fovea and mac OS: ERM with blunting of foveal contour; stable improvement in [facility] and cystic changes temporal mac  Clinical management:  See below  Abbreviations: NFP - Normal foveal profile. CME - cystoid macular edema. PED - pigment epithelial detachment. IRF - intraretinal fluid. SRF - subretinal fluid. EZ - ellipsoid zone. ERM - epiretinal membrane. ORA - outer retinal atrophy. ORT - outer retinal tubulation. SRHM - subretinal hyper-reflective material             ASSESSMENT/PLAN:   ICD-10-CM   1. 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 Posterior vitreous detachment of right eye  H43.811     3. Essential hypertension  I10     4. Hypertensive retinopathy of both eyes  H35.033     5. Epiretinal membrane (ERM) of left eye  H35.372     6. Pseudophakia of both eyes  Z96.1      1. Moderate nonproliferative diabetic retinopathy, both eyes  - Diabetic macular edema, both eyes -- relatively mild  - S/P IVA OD #1 (12.17.18), #2 (01.28.19), #3 (02.27.19), #4 (04.03.19), #5 (05.01.19), #6 (05.27.20), #7 (07.08.20)  - in reviewing her case, there was minimal response to IVA in OCT or BCVA ===================================================================================  - S/P IVTA OD #1 (05.29.19), #2 (07.24.19), #3 (09.04.19), #4 (12.04.19), #5 (02.26.20), # 6 (08.05.20), # 7(09.30.20), #8 (12.23.20) ===================================================================================  - s/p IVE OD #1 (03.03.21), #2 (03.31.21), #3 (04.28.21), #4 (05.28.21), #5 (08.04.21), #6 (09.08.21), #7 (10.06.21), #8 (12.15.21), #9 (02.09.22), #10 (05.11.22), #11 (8.24.22), #12 (10.05.22), #13 (12.14.22), #14 (02.22.23), #15 (07.26.23), #16 (07.26.23), #17 (10.04.23), #18 (12.20.23), #19 (03.11.24), #20 (12.18.24)  - s/p IVE OS #1 (04.28.21), #2 (05.28.21), #3 (12.15.21), #4 (02.09.22), #5 (05.10.23), #6 (07.26.23), #7 (07.26.23), #8 (10.04.23), #9 (12.20.23), #10 (03.11.24)  - S/P focal laser OS (09.02.20)  - s/p focal laser OD (11.17.21)  - OCT shows OD: interval improvement in IRF inferotemporal fovea and mac in 3 mos since last IVE; OS: ERM with blunting of foveal contour; stable improvement in [facility] and cystic changes temporal mac at 12 months since last IVE OS  - BCVA 20/20 OU --  - recommend holding injections today w/ f/u in 3 mos  -- pt in agreement - IVE informed consent obtained and re-signed (12.18.24)  - f/u 3 months,  sooner prn -- DFE/OCT possible injection(s)  2. PVD / vitreous syneresis OD  - Discussed findings and prognosis  - No RT or RD on 360 scleral depressed exam  - Reviewed signs/symptoms of RT/RD  - Strict return precautions for any such RT/RD signs/symptoms   3. Epiretinal membrane, left eye  - mild ERM with VMA/VMT component - BCVA remains 20/20 - still asymptomatic, no metamorphopsia - no indication for surgery at this time - continue to monitor  4,5. Hypertensive retinopathy OU  - discussed importance of tight BP control  - continue to monitor  6. Pseudophakia OU  - s/p CE/IOL OU (Dr. June Leap, March/June 2020)  - beautiful surgeries, doing well  - continue to monitor  Ophthalmic Meds Ordered this visit:  No orders of the defined types were placed in this encounter.    Return in about 3 months (around 01/28/2024) for f/u NPDR OU, DFE, OCT.  There are no Patient Instructions on file for this visit.  This document serves as a record of services personally performed by Karie Chimera, MD, PhD. It was created on their behalf by Glee Arvin. Manson Passey, OA an ophthalmic technician. The creation of this record is the provider's dictation and/or activities during the visit.    Electronically signed by: Glee Arvin. Manson Passey, OA 10/31/23 12:28 AM  Karie Chimera, M.D., Ph.D. Diseases & Surgery of the Retina and Vitreous [facility]  I have reviewed the above documentation for accuracy and completeness, and I agree with the above. Karie Chimera, M.D., Ph.D. 10/31/23 12:33 AM   Abbreviations: M myopia (nearsighted); A astigmatism; H hyperopia (farsighted); P presbyopia; Mrx spectacle prescription;  CTL contact lenses; OD right eye; OS left eye; OU both eyes  XT exotropia; ET esotropia; PEK punctate epithelial keratitis; PEE punctate epithelial erosions; DES dry eye syndrome; MGD meibomian gland dysfunction; ATs artificial tears; PFAT's preservative free artificial tears;  NSC nuclear sclerotic cataract; PSC posterior subcapsular cataract; ERM epi-retinal membrane; PVD posterior vitreous detachment; RD retinal detachment; DM diabetes mellitus; DR diabetic retinopathy; NPDR non-proliferative diabetic retinopathy; PDR proliferative diabetic retinopathy; CSME clinically significant macular edema; DME diabetic macular edema; dbh dot blot hemorrhages; CWS cotton wool spot; POAG primary open angle glaucoma; C/D cup-to-disc ratio; HVF humphrey visual field; GVF goldmann visual field; OCT optical coherence tomography; IOP intraocular pressure; BRVO Branch retinal vein occlusion; CRVO central retinal vein occlusion; CRAO central retinal artery occlusion; BRAO branch retinal artery occlusion; RT retinal tear; SB scleral buckle; PPV pars plana vitrectomy; VH Vitreous hemorrhage; PRP panretinal laser photocoagulation; IVK intravitreal kenalog; VMT vitreomacular traction; MH Macular hole;  NVD neovascularization of the disc; NVE neovascularization elsewhere; AREDS age related eye disease study; ARMD age related macular degeneration; POAG primary open angle glaucoma; EBMD epithelial/anterior basement membrane dystrophy; ACIOL anterior chamber intraocular lens; IOL intraocular lens; PCIOL posterior chamber intraocular lens; Phaco/IOL phacoemulsification with intraocular lens placement; PRK photorefractive keratectomy; LASIK laser assisted in situ keratomileusis; HTN hypertension; DM diabetes mellitus; COPD chronic obstructive pulmonary disease

## 2023-10-22 DIAGNOSIS — I10 Essential (primary) hypertension: Secondary | ICD-10-CM | POA: Diagnosis not present

## 2023-10-22 DIAGNOSIS — E7801 Familial hypercholesterolemia: Secondary | ICD-10-CM | POA: Diagnosis not present

## 2023-10-22 DIAGNOSIS — E1169 Type 2 diabetes mellitus with other specified complication: Secondary | ICD-10-CM | POA: Diagnosis not present

## 2023-10-28 ENCOUNTER — Encounter (INDEPENDENT_AMBULATORY_CARE_PROVIDER_SITE_OTHER): Payer: Self-pay | Admitting: Ophthalmology

## 2023-10-28 ENCOUNTER — Ambulatory Visit (INDEPENDENT_AMBULATORY_CARE_PROVIDER_SITE_OTHER): Payer: PPO | Admitting: Ophthalmology

## 2023-10-28 DIAGNOSIS — Z961 Presence of intraocular lens: Secondary | ICD-10-CM

## 2023-10-28 DIAGNOSIS — H35372 Puckering of macula, left eye: Secondary | ICD-10-CM | POA: Diagnosis not present

## 2023-10-28 DIAGNOSIS — H35033 Hypertensive retinopathy, bilateral: Secondary | ICD-10-CM

## 2023-10-28 DIAGNOSIS — H43811 Vitreous degeneration, right eye: Secondary | ICD-10-CM

## 2023-10-28 DIAGNOSIS — I10 Essential (primary) hypertension: Secondary | ICD-10-CM | POA: Diagnosis not present

## 2023-10-28 DIAGNOSIS — E113313 Type 2 diabetes mellitus with moderate nonproliferative diabetic retinopathy with macular edema, bilateral: Secondary | ICD-10-CM | POA: Diagnosis not present

## 2023-10-29 DIAGNOSIS — I10 Essential (primary) hypertension: Secondary | ICD-10-CM | POA: Diagnosis not present

## 2023-10-29 DIAGNOSIS — Z6827 Body mass index (BMI) 27.0-27.9, adult: Secondary | ICD-10-CM | POA: Diagnosis not present

## 2023-10-29 DIAGNOSIS — E1169 Type 2 diabetes mellitus with other specified complication: Secondary | ICD-10-CM | POA: Diagnosis not present

## 2023-10-29 DIAGNOSIS — R251 Tremor, unspecified: Secondary | ICD-10-CM | POA: Diagnosis not present

## 2023-10-29 DIAGNOSIS — E7800 Pure hypercholesterolemia, unspecified: Secondary | ICD-10-CM | POA: Diagnosis not present

## 2024-01-19 NOTE — Progress Notes (Signed)
 [facility] - Clinic Note  01/27/2024     CHIEF COMPLAINT Patient presents for Retina Follow Up    HISTORY OF PRESENT ILLNESS: Carolyn Hunter is a 73 y.o. female who presents to the clinic today for:   HPI     Retina Follow Up   Patient presents with  Diabetic Retinopathy.  In both eyes.  Severity is moderate.  Duration of 3 months.  Since onset it is stable.  I, the attending physician,  performed the HPI with the patient and updated documentation appropriately.        Comments   PT here for 3 mo ret f/u NPDR OU. Pt states VA is stable, no changes. Pt had bloodwork yesterday, hasn't received results.       Last edited by Ronelle Coffee, MD on 01/27/2024  5:07 PM.     Pt states vision is good  Referring physician: Veda Gerald, MD [address] [place],  [place] [address]  HISTORICAL INFORMATION:   Selected notes from the MEDICAL RECORD NUMBER Referral from Dr. Bobie Burton for DM exam   CURRENT MEDICATIONS: Current Outpatient Medications (Ophthalmic Drugs)  Medication Sig   Aflibercept  (EYLEA  IO) Inject 1 Dose into the eye every 30 (thirty) days. Injectable med given @ retina office q 12 wks.   No current facility-administered medications for this visit. (Ophthalmic Drugs)   Current Outpatient Medications (Other)  Medication Sig   amLODipine  (NORVASC ) 5 MG tablet TAKE 1 TABLET BY MOUTH EVERY DAY   Cholecalciferol (VITAMIN D3) 50 MCG (2000 UT) TABS Take 2,000 Units by mouth daily.   Liniments (SALONPAS PAIN RELIEF PATCH EX) Place 1 patch onto the skin daily as needed (pain.).   metFORMIN  (GLUCOPHAGE ) 1000 MG tablet TAKE 1 TABLET BY MOUTH EVERY DAY WITH BREAKFAST   propranolol  ER (INDERAL  LA) 60 MG 24 hr capsule TAKE 1 CAPSULE BY MOUTH EVERY DAY   rosuvastatin  (CRESTOR ) 40 MG tablet Take 1 tablet (40 mg total) by mouth daily.   No current facility-administered medications for this visit. (Other)   REVIEW OF SYSTEMS: ROS   Positive for:  Neurological, Endocrine, Eyes Negative for: Constitutional, Gastrointestinal, Skin, Genitourinary, Musculoskeletal, HENT, Cardiovascular, Respiratory, Psychiatric, Allergic/Imm, Heme/Lymph Last edited by Anthony Bateman, COT on 01/27/2024  1:30 PM.     ALLERGIES Allergies  Allergen Reactions   Ace Inhibitors Cough   Zetia  [Ezetimibe ] Swelling    Sore throat   PAST MEDICAL HISTORY Past Medical History:  Diagnosis Date   Anxiety    Cataract    OS   Diabetes mellitus without complication (HCC)    Diabetic retinopathy (HCC)    NPDR OU   Hyperlipidemia    Hypertension    Hypertensive retinopathy    OU   Tremors of nervous system    just when I get in a nervous Situation.   Past Surgical History:  Procedure Laterality Date   CATARACT EXTRACTION W/PHACO Right 10/18/2018   Procedure: CATARACT EXTRACTION PHACO AND INTRAOCULAR LENS PLACEMENT (IOC);  Surgeon: Tarri Farm, MD;  Location: [facility];  Service: Ophthalmology;  Laterality: Right;  CDE: 8.11   CATARACT EXTRACTION W/PHACO Left 01/17/2019   Procedure: CATARACT EXTRACTION PHACO AND INTRAOCULAR LENS PLACEMENT (IOC);  Surgeon: Tarri Farm, MD;  Location: [facility];  Service: Ophthalmology;  Laterality: Left;  CDE: 6.38   CESAREAN SECTION     1988   COLONOSCOPY WITH PROPOFOL  N/A 05/04/2020   Procedure: COLONOSCOPY WITH PROPOFOL ;  Surgeon: Urban Garden,  MD;  Location: [facility];  Service: Gastroenterology;  Laterality: N/A;  830   EYE SURGERY Right    Cataract extraction OD   POLYPECTOMY  05/04/2020   Procedure: POLYPECTOMY;  Surgeon: Umberto Ganong, Bearl Limes, MD;  Location: [facility];  Service: Gastroenterology;;   FAMILY HISTORY Family History  Problem Relation Age of Onset   Cancer Mother        breast and colon cancer   Breast cancer Mother    Diabetes Brother    Diabetes Son    Asthma Father        COPD   Diabetes Son    SOCIAL HISTORY Social History   Tobacco Use   Smoking status: Never    Smokeless tobacco: Never  Vaping Use   Vaping status: Never Used  Substance Use Topics   Alcohol  use: No   Drug use: No       OPHTHALMIC EXAM:  Base Eye Exam     Visual Acuity (Snellen - Linear)       Right Left   Dist [place] 20/25 20/20   Dist ph [place] [facility]     Correction: Glasses         Tonometry (Tonopen, 1:35 PM)       Right Left   Pressure 15 12         Pupils       Dark Light Shape React APD   Right 3 2 Round Brisk None   Left 3 2 Round Brisk None         Visual Fields (Counting fingers)       Left Right    Full Full         Extraocular Movement       Right Left    Full, Ortho Full, Ortho         Neuro/Psych     Oriented x3: Yes   Mood/Affect: Normal         Dilation     Both eyes: 1.0% Mydriacyl, 2.5% Phenylephrine  @ 1:36 PM           Slit Lamp and Fundus Exam     Slit Lamp Exam       Right Left   Lids/Lashes Dermatochalasis - upper lid, Meibomian gland dysfunction Dermatochalasis - upper lid, Meibomian gland dysfunction   Conjunctiva/Sclera White and quiet White and quiet   Cornea Mild Arcus, Well healed temporal cataract wounds, mild tear film debris Mild Arcus, EBMD, trace Punctate epithelial erosions, Well healed temporal cataract wounds, mild tear film debris   Anterior Chamber Deep, 1+cell/pigment Deep and quiet   Iris Round and moderately dilated, No NVI, PPM nasally Round and dilated, No NVI   Lens PC IOL in good position, trace Posterior capsular opacification PC IOL in good position, trace PCO   Anterior Vitreous Vitreous syneresis, mild vitreous condensations, Posterior vitreous detachment, old, white VH settled inferiorly Vitreous syneresis         Fundus Exam       Right Left   Disc mild pallor, Sharp rim mild pallor, Sharp rim   C/D Ratio 0.4 0.4   Macula Flat, good foveal reflex, interval increase in cystic changes / edema inferotemporal fovea and mac, punctate MA and exudates IT mac, ERM temporal macula  Flat, good foveal reflex, epiretinal membrane, punctate exudates and edema temporal macula -- stably improved, minimal MA, no frank edema   Vessels attenuated, Tortuous attenuated, Tortuous   Periphery Attached, scattered MA's, old VH attached,  scattered DBH greatest nasally and IN, focal exudates nasal midzone - improved           IMAGING AND PROCEDURES  Imaging and Procedures for 12/10/17  OCT, Retina - OU - Both Eyes       Right Eye Quality was good. Central Foveal Thickness: 428. Progression has worsened. Findings include normal foveal contour, no SRF, intraretinal hyper-reflective material, intraretinal fluid (interval re-development of IRF inferotemporal fovea and mac).   Left Eye Quality was good. Central Foveal Thickness: 341. Progression has been stable. Findings include no SRF, abnormal foveal contour, intraretinal hyper-reflective material, epiretinal membrane, intraretinal fluid, macular pucker, vitreomacular adhesion (ERM with blunting of foveal contour; stable improvement in [facility] and cystic changes temporal mac).   Notes **Images taken, stored on drive**  Diagnosis / Impression:  DME OU (OD>OS) OD: interval re-development of  IRF inferotemporal fovea and mac OS: ERM with blunting of foveal contour; stable improvement in [facility] and cystic changes temporal mac  Clinical management:  See below  Abbreviations: NFP - Normal foveal profile. CME - cystoid macular edema. PED - pigment epithelial detachment. IRF - intraretinal fluid. SRF - subretinal fluid. EZ - ellipsoid zone. ERM - epiretinal membrane. ORA - outer retinal atrophy. ORT - outer retinal tubulation. SRHM - subretinal hyper-reflective material       Intravitreal Injection, Pharmacologic Agent - OD - Right Eye       Time Out 01/27/2024. 2:20 PM. Confirmed correct patient, procedure, site, and patient consented.   Anesthesia Topical anesthesia was used. Anesthetic medications included Lidocaine  2%,  Proparacaine 0.5%.   Procedure Preparation included 5% betadine  to ocular surface, eyelid speculum. A (32g) needle was used.   Injection: 1.25 mg Bevacizumab  1.25mg /0.47ml   Route: Intravitreal, Site: Right Eye   NDC: [id number], Lot: [id number], Expiration date: 04/14/2024   Post-op Post injection exam found visual acuity of at least counting fingers. The patient tolerated the procedure well. There were no complications. The patient received written and verbal post procedure care education. Post injection medications were not given.             ASSESSMENT/PLAN:   ICD-10-CM   1. 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 Posterior vitreous detachment of right eye  H43.811     3. Essential hypertension  I10     4. Hypertensive retinopathy of both eyes  H35.033     5. Epiretinal membrane (ERM) of left eye  H35.372     6. Pseudophakia of both eyes  Z96.1       1. Moderate nonproliferative diabetic retinopathy, both eyes  - Diabetic macular edema, both eyes -- relatively mild  - S/P IVA OD #1 (12.17.18), #2 (01.28.19), #3 (02.27.19), #4 (04.03.19), #5 (05.01.19), #6 (05.27.20), #7 (07.08.20)  - in reviewing her case, there was minimal response to IVA in OCT or BCVA  ===========================  - S/P IVTA OD #1 (05.29.19), #2 (07.24.19), #3 (09.04.19), #4 (12.04.19), #5 (02.26.20), # 6 (08.05.20), # 7(09.30.20), #8 (12.23.20)  ==========================  - s/p IVE OD #1 (03.03.21), #2 (03.31.21), #3 (04.28.21), #4 (05.28.21), #5 (08.04.21), #6 (09.08.21), #7 (10.06.21), #8 (12.15.21), #9 (02.09.22), #10 (05.11.22), #11 (8.24.22), #12 (10.05.22), #13 (12.14.22), #14 (02.22.23), #15 (07.26.23), #16 (07.26.23), #17 (10.04.23), #18 (12.20.23), #19 (03.11.24),  #  20 (12.18.24)  - s/p IVE OS #1 (04.28.21), #2 (05.28.21), #3 (12.15.21), #4 (02.09.22), #5 (05.10.23), #6 (07.26.23), #7 (07.26.23), #8 (10.04.23), #9 (12.20.23), #10 (03.11.24)  - S/P focal laser OS (09.02.20)  - s/p focal laser OD (11.17.21)  - OCT shows OD: interval re-development of IRF inferotemporal fovea and macula -- 6 mos since last IVE; OS: ERM with blunting of foveal contour; stable improvement in [facility] and cystic changes temporal mac at 15 months since last IVE OS  - BCVA OD: 20/25 -- decreased; OS stable at 20/20  - recommend IVA OD #8 today, 06.18.25 -- due to no funding with [employer]  - pt wishes to proceed with injection  - RBA of procedure discussed, questions answered - informed consent obtained and signed - see procedure note - IVA informed consent obtained and re-signed (06.18.25)  - f/u 6-8 weeks, sooner prn -- DFE/OCT possible injection(s)  2. PVD / vitreous syneresis OD  - Discussed findings and prognosis  - No RT or RD on 360 scleral depressed exam  - Reviewed signs/symptoms of RT/RD  - Strict return precautions for any such RT/RD signs/symptoms   3. Epiretinal membrane, left eye  - mild ERM with VMA/VMT component - BCVA remains 20/20 - still asymptomatic, no metamorphopsia - no indication for surgery at this time - continue to monitor  4,5. Hypertensive retinopathy OU  - discussed importance of tight BP control  - continue to monitor  6. Pseudophakia OU  - s/p CE/IOL OU (Dr. Adora Hoover, March/June 2020)  - beautiful surgeries, doing well  - continue to monitor  Ophthalmic Meds Ordered this visit:  Meds ordered this encounter  Medications   Bevacizumab  (AVASTIN ) SOLN 1.25 mg     Return for f/u 6-8 weeks, NPDR OU, DFE, OCT, Possible Injxn.  There are no Patient Instructions on file for this visit.  This document serves as a record of services personally performed by Jeanice Millard, MD, PhD. It was created on their behalf by Angelia Kelp, an  ophthalmic technician. The creation of this record is the provider's dictation and/or activities during the visit.    Electronically signed by: Angelia Kelp, OA, 01/27/24  5:08 PM  This document serves as a record of services personally performed by Jeanice Millard, MD, PhD. It was created on their behalf by Morley Arabia. Bevin Bucks, OA an ophthalmic technician. The creation of this record is the provider's dictation and/or activities during the visit.    Electronically signed by: Morley Arabia. Bevin Bucks, OA 01/27/24 5:08 PM  Jeanice Millard, M.D., Ph.D. Diseases & Surgery of the Retina and Vitreous [facility]  I have reviewed the above documentation for accuracy and completeness, and I agree with the above. Jeanice Millard, M.D., Ph.D. 01/27/24 5:08 PM   Abbreviations: M myopia (nearsighted); A astigmatism; H hyperopia (farsighted); P presbyopia; Mrx spectacle prescription;  CTL contact lenses; OD right eye; OS left eye; OU both eyes  XT exotropia; ET esotropia; PEK punctate epithelial keratitis; PEE punctate epithelial erosions; DES dry eye syndrome; MGD meibomian gland dysfunction; ATs artificial tears; PFAT's preservative free artificial tears; NSC nuclear sclerotic cataract; PSC posterior subcapsular cataract; ERM epi-retinal membrane; PVD posterior vitreous detachment; RD retinal detachment; DM diabetes mellitus; DR diabetic retinopathy; NPDR non-proliferative diabetic retinopathy; PDR proliferative diabetic retinopathy; CSME clinically significant macular edema; DME diabetic macular edema; dbh dot blot hemorrhages; CWS cotton wool spot; POAG primary open angle glaucoma; C/D cup-to-disc ratio; HVF humphrey visual field; GVF goldmann visual  field; OCT optical coherence tomography; IOP intraocular pressure; BRVO Branch retinal vein occlusion; CRVO central retinal vein occlusion; CRAO central retinal artery occlusion; BRAO branch retinal artery occlusion; RT retinal tear; SB scleral  buckle; PPV pars plana vitrectomy; VH Vitreous hemorrhage; PRP panretinal laser photocoagulation; IVK intravitreal kenalog ; VMT vitreomacular traction; MH Macular hole;  NVD neovascularization of the disc; NVE neovascularization elsewhere; AREDS age related eye disease study; ARMD age related macular degeneration; POAG primary open angle glaucoma; EBMD epithelial/anterior basement membrane dystrophy; ACIOL anterior chamber intraocular lens; IOL intraocular lens; PCIOL posterior chamber intraocular lens; Phaco/IOL phacoemulsification with intraocular lens placement; PRK photorefractive keratectomy; LASIK laser assisted in situ keratomileusis; HTN hypertension; DM diabetes mellitus; COPD chronic obstructive pulmonary disease

## 2024-01-26 DIAGNOSIS — E7800 Pure hypercholesterolemia, unspecified: Secondary | ICD-10-CM | POA: Diagnosis not present

## 2024-01-26 DIAGNOSIS — I10 Essential (primary) hypertension: Secondary | ICD-10-CM | POA: Diagnosis not present

## 2024-01-26 DIAGNOSIS — E1169 Type 2 diabetes mellitus with other specified complication: Secondary | ICD-10-CM | POA: Diagnosis not present

## 2024-01-26 DIAGNOSIS — Z13220 Encounter for screening for lipoid disorders: Secondary | ICD-10-CM | POA: Diagnosis not present

## 2024-01-27 ENCOUNTER — Encounter (INDEPENDENT_AMBULATORY_CARE_PROVIDER_SITE_OTHER): Payer: Self-pay | Admitting: Ophthalmology

## 2024-01-27 ENCOUNTER — Ambulatory Visit (INDEPENDENT_AMBULATORY_CARE_PROVIDER_SITE_OTHER): Admitting: Ophthalmology

## 2024-01-27 DIAGNOSIS — I10 Essential (primary) hypertension: Secondary | ICD-10-CM

## 2024-01-27 DIAGNOSIS — Z961 Presence of intraocular lens: Secondary | ICD-10-CM | POA: Diagnosis not present

## 2024-01-27 DIAGNOSIS — H35372 Puckering of macula, left eye: Secondary | ICD-10-CM

## 2024-01-27 DIAGNOSIS — H35033 Hypertensive retinopathy, bilateral: Secondary | ICD-10-CM

## 2024-01-27 DIAGNOSIS — E113313 Type 2 diabetes mellitus with moderate nonproliferative diabetic retinopathy with macular edema, bilateral: Secondary | ICD-10-CM

## 2024-01-27 DIAGNOSIS — H43811 Vitreous degeneration, right eye: Secondary | ICD-10-CM

## 2024-01-27 MED ORDER — BEVACIZUMAB CHEMO INJECTION 1.25MG/0.05ML SYRINGE FOR KALEIDOSCOPE
1.2500 mg | INTRAVITREAL | Status: AC | PRN
  Administered 2024-01-27: 1.25 mg via INTRAVITREAL

## 2024-02-02 DIAGNOSIS — Z6826 Body mass index (BMI) 26.0-26.9, adult: Secondary | ICD-10-CM | POA: Diagnosis not present

## 2024-02-02 DIAGNOSIS — Z23 Encounter for immunization: Secondary | ICD-10-CM | POA: Diagnosis not present

## 2024-02-02 DIAGNOSIS — R251 Tremor, unspecified: Secondary | ICD-10-CM | POA: Diagnosis not present

## 2024-02-02 DIAGNOSIS — Z1331 Encounter for screening for depression: Secondary | ICD-10-CM | POA: Diagnosis not present

## 2024-02-02 DIAGNOSIS — Z1389 Encounter for screening for other disorder: Secondary | ICD-10-CM | POA: Diagnosis not present

## 2024-02-02 DIAGNOSIS — I10 Essential (primary) hypertension: Secondary | ICD-10-CM | POA: Diagnosis not present

## 2024-02-02 DIAGNOSIS — E1169 Type 2 diabetes mellitus with other specified complication: Secondary | ICD-10-CM | POA: Diagnosis not present

## 2024-02-02 DIAGNOSIS — E7800 Pure hypercholesterolemia, unspecified: Secondary | ICD-10-CM | POA: Diagnosis not present

## 2024-02-02 DIAGNOSIS — Z0001 Encounter for general adult medical examination with abnormal findings: Secondary | ICD-10-CM | POA: Diagnosis not present

## 2024-03-09 NOTE — Progress Notes (Signed)
 [facility] - Clinic Note  03/23/2024     CHIEF COMPLAINT Patient presents for Retina Follow Up    HISTORY OF PRESENT ILLNESS: Carolyn Hunter is a 73 y.o. female who presents to the clinic today for:   HPI     Retina Follow Up   Patient presents with  Diabetic Retinopathy.  In both eyes.  This started 8 weeks ago.  I, the attending physician,  performed the HPI with the patient and updated documentation appropriately.        Comments   Patient here for 8 weeks retina follow up for NPDR OU. Patient states vision about the same. No eye pain.      Last edited by Valdemar Rogue, MD on 03/23/2024  3:05 PM.      Pt states she 'needs an injection'. Felt it was improved until she read the eye chart and struggled.  Referring physician: Orpha Yancey LABOR, MD [address] [place],  [place] [address]  HISTORICAL INFORMATION:   Selected notes from the MEDICAL RECORD NUMBER Referral from Dr. EMERSON Kawasaki for DM exam   CURRENT MEDICATIONS: Current Outpatient Medications (Ophthalmic Drugs)  Medication Sig   Aflibercept  (EYLEA  IO) Inject 1 Dose into the eye every 30 (thirty) days. Injectable med given @ retina office q 12 wks.   No current facility-administered medications for this visit. (Ophthalmic Drugs)   Current Outpatient Medications (Other)  Medication Sig   amLODipine  (NORVASC ) 5 MG tablet TAKE 1 TABLET BY MOUTH EVERY DAY   Cholecalciferol (VITAMIN D3) 50 MCG (2000 UT) TABS Take 2,000 Units by mouth daily.   Liniments (SALONPAS PAIN RELIEF PATCH EX) Place 1 patch onto the skin daily as needed (pain.).   metFORMIN  (GLUCOPHAGE ) 1000 MG tablet TAKE 1 TABLET BY MOUTH EVERY DAY WITH BREAKFAST   propranolol  ER (INDERAL  LA) 60 MG 24 hr capsule TAKE 1 CAPSULE BY MOUTH EVERY DAY   rosuvastatin  (CRESTOR ) 40 MG tablet Take 1 tablet (40 mg total) by mouth daily.   No current facility-administered medications for this visit. (Other)   REVIEW OF SYSTEMS: ROS    Positive for: Neurological, Endocrine, Eyes Negative for: Constitutional, Gastrointestinal, Skin, Genitourinary, Musculoskeletal, HENT, Cardiovascular, Respiratory, Psychiatric, Allergic/Imm, Heme/Lymph Last edited by Orval Asberry RAMAN, COA on 03/23/2024  1:51 PM.      ALLERGIES Allergies  Allergen Reactions   Ace Inhibitors Cough   Zetia  [Ezetimibe ] Swelling    Sore throat   PAST MEDICAL HISTORY Past Medical History:  Diagnosis Date   Anxiety    Cataract    OS   Diabetes mellitus without complication (HCC)    Diabetic retinopathy (HCC)    NPDR OU   Hyperlipidemia    Hypertension    Hypertensive retinopathy    OU   Tremors of nervous system    just when I get in a nervous Situation.   Past Surgical History:  Procedure Laterality Date   CATARACT EXTRACTION W/PHACO Right 10/18/2018   Procedure: CATARACT EXTRACTION PHACO AND INTRAOCULAR LENS PLACEMENT (IOC);  Surgeon: Harrie Agent, MD;  Location: [facility];  Service: Ophthalmology;  Laterality: Right;  CDE: 8.11   CATARACT EXTRACTION W/PHACO Left 01/17/2019   Procedure: CATARACT EXTRACTION PHACO AND INTRAOCULAR LENS PLACEMENT (IOC);  Surgeon: Harrie Agent, MD;  Location: [facility];  Service: Ophthalmology;  Laterality: Left;  CDE: 6.38   CESAREAN SECTION     1988   COLONOSCOPY WITH PROPOFOL  N/A 05/04/2020   Procedure: COLONOSCOPY WITH PROPOFOL ;  Surgeon: Eartha  Angelia Sieving, MD;  Location: [facility];  Service: Gastroenterology;  Laterality: N/A;  830   EYE SURGERY Right    Cataract extraction OD   POLYPECTOMY  05/04/2020   Procedure: POLYPECTOMY;  Surgeon: Eartha Angelia, Sieving, MD;  Location: [facility];  Service: Gastroenterology;;   FAMILY HISTORY Family History  Problem Relation Age of Onset   Cancer Mother        breast and colon cancer   Breast cancer Mother    Diabetes Brother    Diabetes Son    Asthma Father        COPD   Diabetes Son    SOCIAL HISTORY Social History   Tobacco Use   Smoking  status: Never   Smokeless tobacco: Never  Vaping Use   Vaping status: Never Used  Substance Use Topics   Alcohol  use: No   Drug use: No       OPHTHALMIC EXAM:  Base Eye Exam     Visual Acuity (Snellen - Linear)       Right Left   Dist [place] 20/30 -1 20/20 -2   Dist ph [place] 20/25 -2          Tonometry (Tonopen, 1:49 PM)       Right Left   Pressure 16 15         Pupils       Dark Light Shape React APD   Right 3 2 Round Brisk None   Left 3 2 Round Brisk None         Visual Fields (Counting fingers)       Left Right    Full Full         Extraocular Movement       Right Left    Full, Ortho Full, Ortho         Neuro/Psych     Oriented x3: Yes   Mood/Affect: Normal         Dilation     Both eyes: 1.0% Mydriacyl, 2.5% Phenylephrine  @ 1:49 PM           Slit Lamp and Fundus Exam     Slit Lamp Exam       Right Left   Lids/Lashes Dermatochalasis - upper lid, Meibomian gland dysfunction Dermatochalasis - upper lid, Meibomian gland dysfunction   Conjunctiva/Sclera White and quiet White and quiet   Cornea Mild Arcus, Well healed temporal cataract wounds, mild tear film debris Mild Arcus, EBMD, trace Punctate epithelial erosions, Well healed temporal cataract wounds, mild tear film debris   Anterior Chamber Deep, 1+cell/pigment Deep and quiet   Iris Round and moderately dilated, No NVI, PPM nasally Round and dilated, No NVI   Lens PC IOL in good position, trace Posterior capsular opacification PC IOL in good position, trace PCO   Anterior Vitreous Vitreous syneresis, mild vitreous condensations, Posterior vitreous detachment, old, white VH settled inferiorly Vitreous syneresis         Fundus Exam       Right Left   Disc mild pallor, Sharp rim mild pallor, Sharp rim   C/D Ratio 0.4 0.4   Macula Flat, good foveal reflex, interval improvement in cystic changes / edema inferotemporal fovea and mac, punctate MA and exudates IT mac, ERM temporal  macula Flat, good foveal reflex, epiretinal membrane, punctate exudates and edema temporal macula -- stably improved, minimal MA, no frank edema   Vessels attenuated, Tortuous attenuated, Tortuous   Periphery Attached, scattered MA's, old VH attached,  scattered DBH greatest nasally and IN, focal exudates nasal midzone - improved           IMAGING AND PROCEDURES  Imaging and Procedures for 12/10/17  OCT, Retina - OU - Both Eyes       Right Eye Quality was good. Central Foveal Thickness: 355. Progression has improved. Findings include normal foveal contour, no SRF, intraretinal hyper-reflective material, intraretinal fluid (Interval improvement in IRF inferotemporal fovea and mac).   Left Eye Quality was good. Central Foveal Thickness: 340. Progression has been stable. Findings include no SRF, abnormal foveal contour, intraretinal hyper-reflective material, epiretinal membrane, intraretinal fluid, macular pucker, vitreomacular adhesion (ERM with blunting of foveal contour; stable improvement in [facility] and cystic changes temporal mac).   Notes **Images taken, stored on drive**  Diagnosis / Impression:  DME OU (OD>OS) OD: Interval improvement in IRF inferotemporal fovea and mac OS: ERM with blunting of foveal contour; stable improvement in [facility] and cystic changes temporal mac  Clinical management:  See below  Abbreviations: NFP - Normal foveal profile. CME - cystoid macular edema. PED - pigment epithelial detachment. IRF - intraretinal fluid. SRF - subretinal fluid. EZ - ellipsoid zone. ERM - epiretinal membrane. ORA - outer retinal atrophy. ORT - outer retinal tubulation. SRHM - subretinal hyper-reflective material       Intravitreal Injection, Pharmacologic Agent - OD - Right Eye       Time Out 03/23/2024. 2:56 PM. Confirmed correct patient, procedure, site, and patient consented.   Anesthesia Topical anesthesia was used. Anesthetic medications included Lidocaine  2%,  Proparacaine 0.5%.   Procedure Preparation included 5% betadine  to ocular surface, eyelid speculum. A supplied needle was used.   Injection: 1.25 mg Bevacizumab  1.25mg /0.52ml   Route: Intravitreal, Site: Right Eye   NDC: [id number], Lot: [id number], Expiration date: 04/04/2024   Post-op Post injection exam found visual acuity of at least counting fingers. The patient tolerated the procedure well. There were no complications. The patient received written and verbal post procedure care education.            ASSESSMENT/PLAN:   ICD-10-CM   1. 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 Posterior vitreous detachment of right eye  H43.811     3. Essential hypertension  I10     4. Hypertensive retinopathy of both eyes  H35.033     5. Epiretinal membrane (ERM) of left eye  H35.372     6. Pseudophakia of both eyes  Z96.1      1. Moderate nonproliferative diabetic retinopathy, both eyes  - Diabetic macular edema, both eyes -- relatively mild  - S/P IVA OD #1 (12.17.18), #2 (01.28.19), #3 (02.27.19), #4 (04.03.19), #5 (05.01.19), #6 (05.27.20), #7 (07.08.20), #8 (06.18.25)  ==========  - in reviewing her case, there was minimal response to IVA in OCT or BCVA   - S/P IVTA OD #1 (05.29.19), #2 (07.24.19), #3 (09.04.19), #4 (12.04.19), #5 (02.26.20), # 6 (08.05.20), # 7(09.30.20), #8 (12.23.20)  ==========================  - s/p IVE OD #1 (03.03.21), #2 (03.31.21), #3 (04.28.21), #4 (05.28.21), #5 (08.04.21), #6 (09.08.21), #7 (10.06.21), #8 (12.15.21), #9 (02.09.22), #10 (05.11.22), #11 (8.24.22), #12 (10.05.22), #13 (12.14.22), #14 (02.22.23), #15 (07.26.23), #16 (07.26.23), #17 (10.04.23), #18 (12.20.23), #19 (03.11.24), #20 (12.18.24)  - s/p IVE OS #  1 (04.28.21),  #2 (05.28.21), #3 (12.15.21), #4 (02.09.22), #5 (05.10.23), #6 (07.26.23), #7 (07.26.23), #8 (10.04.23), #9 (12.20.23), #10 (03.11.24)  - S/P focal laser OS (09.02.20)  - s/p focal laser OD (11.17.21)  - OCT shows OD: Interval improvement in IRF inferotemporal fovea and mac ; OS: ERM with blunting of foveal contour; stable improvement in [facility] and cystic changes temporal mac at 8 weeks  - BCVA OD: 20/25- stable; OS stable at 20/20  - recommend IVA OD #9 today (08.13.25) w/ f/u in 6-8 wks ([employer] funding unavailable)  - pt wishes to proceed with injection  - RBA of procedure discussed, questions answered - informed consent obtained and signed - see procedure note - IVA informed consent obtained and re-signed (06.18.25)  - f/u 6-8 weeks, sooner prn -- DFE/OCT possible injection(s)  2. PVD / vitreous syneresis OD  - Discussed findings and prognosis  - No RT or RD on 360 scleral depressed exam  - Reviewed signs/symptoms of RT/RD  - Strict return precautions for any such RT/RD signs/symptoms   3. Epiretinal membrane, left eye  - mild ERM with VMA/VMT component - BCVA remains 20/20 - still asymptomatic, no metamorphopsia - no indication for surgery at this time - continue to monitor  4,5. Hypertensive retinopathy OU  - discussed importance of tight BP control  - continue to monitor  6. Pseudophakia OU  - s/p CE/IOL OU (Dr. Harrie, March/June 2020)  - beautiful surgeries, doing well  - continue to monitor  Ophthalmic Meds Ordered this visit:  Meds ordered this encounter  Medications   Bevacizumab  (AVASTIN ) SOLN 1.25 mg     Return for 6-8weeks NPDR OU, DFE, OCT, Possible Injxn.  There are no Patient Instructions on file for this visit.  This document serves as a record of services personally performed by Redell JUDITHANN Hans, MD, PhD. It was created on their behalf by Avelina Pereyra, COA an ophthalmic technician. The creation of this record is the provider's dictation and/or  activities during the visit.   Electronically signed by: Avelina GORMAN Pereyra, COT  03/27/24  1:42 PM   This document serves as a record of services personally performed by Redell JUDITHANN Hans, MD, PhD. It was created on their behalf by Almetta Pesa, an ophthalmic technician. The creation of this record is the provider's dictation and/or activities during the visit.    Electronically signed by: Almetta Pesa, OA, 03/27/24  1:42 PM  Redell JUDITHANN Hans, M.D., Ph.D. Diseases & Surgery of the Retina and Vitreous [facility]  I have reviewed the above documentation for accuracy and completeness, and I agree with the above. Redell JUDITHANN Hans, M.D., Ph.D. 03/27/24 1:50 PM   Abbreviations: M myopia (nearsighted); A astigmatism; H hyperopia (farsighted); P presbyopia; Mrx spectacle prescription;  CTL contact lenses; OD right eye; OS left eye; OU both eyes  XT exotropia; ET esotropia; PEK punctate epithelial keratitis; PEE punctate epithelial erosions; DES dry eye syndrome; MGD meibomian gland dysfunction; ATs artificial tears; PFAT's preservative free artificial tears; NSC nuclear sclerotic cataract; PSC posterior subcapsular cataract; ERM epi-retinal membrane; PVD posterior vitreous detachment; RD retinal detachment; DM diabetes mellitus; DR diabetic retinopathy; NPDR non-proliferative diabetic retinopathy; PDR proliferative diabetic retinopathy; CSME clinically significant macular edema; DME diabetic macular edema; dbh dot blot hemorrhages; CWS cotton wool spot; POAG primary open angle glaucoma; C/D cup-to-disc ratio; HVF humphrey visual field; GVF goldmann visual field; OCT optical coherence tomography; IOP intraocular pressure; BRVO Branch retinal vein occlusion; CRVO central retinal  vein occlusion; CRAO central retinal artery occlusion; BRAO branch retinal artery occlusion; RT retinal tear; SB scleral buckle; PPV pars plana vitrectomy; VH Vitreous hemorrhage; PRP panretinal laser  photocoagulation; IVK intravitreal kenalog ; VMT vitreomacular traction; MH Macular hole;  NVD neovascularization of the disc; NVE neovascularization elsewhere; AREDS age related eye disease study; ARMD age related macular degeneration; POAG primary open angle glaucoma; EBMD epithelial/anterior basement membrane dystrophy; ACIOL anterior chamber intraocular lens; IOL intraocular lens; PCIOL posterior chamber intraocular lens; Phaco/IOL phacoemulsification with intraocular lens placement; PRK photorefractive keratectomy; LASIK laser assisted in situ keratomileusis; HTN hypertension; DM diabetes mellitus; COPD chronic obstructive pulmonary disease

## 2024-03-23 ENCOUNTER — Encounter (INDEPENDENT_AMBULATORY_CARE_PROVIDER_SITE_OTHER): Payer: Self-pay | Admitting: Ophthalmology

## 2024-03-23 ENCOUNTER — Ambulatory Visit (INDEPENDENT_AMBULATORY_CARE_PROVIDER_SITE_OTHER): Admitting: Ophthalmology

## 2024-03-23 DIAGNOSIS — I10 Essential (primary) hypertension: Secondary | ICD-10-CM | POA: Diagnosis not present

## 2024-03-23 DIAGNOSIS — Z961 Presence of intraocular lens: Secondary | ICD-10-CM | POA: Diagnosis not present

## 2024-03-23 DIAGNOSIS — H43811 Vitreous degeneration, right eye: Secondary | ICD-10-CM

## 2024-03-23 DIAGNOSIS — H35033 Hypertensive retinopathy, bilateral: Secondary | ICD-10-CM

## 2024-03-23 DIAGNOSIS — H35372 Puckering of macula, left eye: Secondary | ICD-10-CM | POA: Diagnosis not present

## 2024-03-23 DIAGNOSIS — E113313 Type 2 diabetes mellitus with moderate nonproliferative diabetic retinopathy with macular edema, bilateral: Secondary | ICD-10-CM | POA: Diagnosis not present

## 2024-03-23 MED ORDER — BEVACIZUMAB CHEMO INJECTION 1.25MG/0.05ML SYRINGE FOR KALEIDOSCOPE
1.2500 mg | INTRAVITREAL | Status: AC | PRN
  Administered 2024-03-23 (×2): 1.25 mg via INTRAVITREAL

## 2024-05-10 NOTE — Progress Notes (Signed)
 [facility] - Clinic Note  05/18/2024     CHIEF COMPLAINT Patient presents for Retina Follow Up    HISTORY OF PRESENT ILLNESS: Carolyn Hunter is a 73 y.o. female who presents to the clinic today for:   HPI     Retina Follow Up   Patient presents with  Diabetic Retinopathy.  In both eyes.  This started 8 weeks ago.  I, the attending physician,  performed the HPI with the patient and updated documentation appropriately.        Comments   Patient here for 8 weeks retina follow up for NPDR OU. Patient states vision about the same as last time. No eye pain. Doesn't use drops.      Last edited by Valdemar Rogue, MD on 05/18/2024  5:16 PM.     Pt states the vision is about the same.   Referring physician: Orpha Yancey LABOR, MD [id number] [place],  [place] [address]  HISTORICAL INFORMATION:   Selected notes from the MEDICAL RECORD NUMBER Referral from Dr. EMERSON Kawasaki for DM exam   CURRENT MEDICATIONS: Current Outpatient Medications (Ophthalmic Drugs)  Medication Sig   Aflibercept  (EYLEA  IO) Inject 1 Dose into the eye every 30 (thirty) days. Injectable med given @ retina office q 12 wks.   No current facility-administered medications for this visit. (Ophthalmic Drugs)   Current Outpatient Medications (Other)  Medication Sig   amLODipine  (NORVASC ) 5 MG tablet TAKE 1 TABLET BY MOUTH EVERY DAY   Cholecalciferol (VITAMIN D3) 50 MCG (2000 UT) TABS Take 2,000 Units by mouth daily.   Liniments (SALONPAS PAIN RELIEF PATCH EX) Place 1 patch onto the skin daily as needed (pain.).   metFORMIN  (GLUCOPHAGE ) 1000 MG tablet TAKE 1 TABLET BY MOUTH EVERY DAY WITH BREAKFAST   propranolol  ER (INDERAL  LA) 60 MG 24 hr capsule TAKE 1 CAPSULE BY MOUTH EVERY DAY   rosuvastatin  (CRESTOR ) 40 MG tablet Take 1 tablet (40 mg total) by mouth daily.   No current facility-administered medications for this visit. (Other)   REVIEW OF SYSTEMS: ROS   Positive for: Neurological,  Endocrine, Eyes Negative for: Constitutional, Gastrointestinal, Skin, Genitourinary, Musculoskeletal, HENT, Cardiovascular, Respiratory, Psychiatric, Allergic/Imm, Heme/Lymph Last edited by Orval Asberry RAMAN, COA on 05/18/2024  1:03 PM.     ALLERGIES Allergies  Allergen Reactions   Ace Inhibitors Cough   Zetia  [Ezetimibe ] Swelling    Sore throat   PAST MEDICAL HISTORY Past Medical History:  Diagnosis Date   Anxiety    Cataract    OS   Diabetes mellitus without complication (HCC)    Diabetic retinopathy (HCC)    NPDR OU   Hyperlipidemia    Hypertension    Hypertensive retinopathy    OU   Tremors of nervous system    just when I get in a nervous Situation.   Past Surgical History:  Procedure Laterality Date   CATARACT EXTRACTION W/PHACO Right 10/18/2018   Procedure: CATARACT EXTRACTION PHACO AND INTRAOCULAR LENS PLACEMENT (IOC);  Surgeon: Harrie Agent, MD;  Location: [facility];  Service: Ophthalmology;  Laterality: Right;  CDE: 8.11   CATARACT EXTRACTION W/PHACO Left 01/17/2019   Procedure: CATARACT EXTRACTION PHACO AND INTRAOCULAR LENS PLACEMENT (IOC);  Surgeon: Harrie Agent, MD;  Location: [facility];  Service: Ophthalmology;  Laterality: Left;  CDE: 6.38   CESAREAN SECTION     1988   COLONOSCOPY WITH PROPOFOL  N/A 05/04/2020   Procedure: COLONOSCOPY WITH PROPOFOL ;  Surgeon: Eartha Angelia Sieving, MD;  Location:  [facility];  Service: Gastroenterology;  Laterality: N/A;  830   EYE SURGERY Right    Cataract extraction OD   POLYPECTOMY  05/04/2020   Procedure: POLYPECTOMY;  Surgeon: Eartha Flavors, Toribio, MD;  Location: [facility];  Service: Gastroenterology;;   FAMILY HISTORY Family History  Problem Relation Age of Onset   Cancer Mother        breast and colon cancer   Breast cancer Mother    Diabetes Brother    Diabetes Son    Asthma Father        COPD   Diabetes Son    SOCIAL HISTORY Social History   Tobacco Use   Smoking status: Never   Smokeless  tobacco: Never  Vaping Use   Vaping status: Never Used  Substance Use Topics   Alcohol  use: No   Drug use: No       OPHTHALMIC EXAM:  Base Eye Exam     Visual Acuity (Snellen - Linear)       Right Left   Dist [place] 20/40 -2 20/20   Dist ph [place] 20/25 -1          Tonometry (Tonopen, 1:01 PM)       Right Left   Pressure 15 14         Pupils       Dark Light Shape React APD   Right 3 2 Round Brisk None   Left 3 2 Round Brisk None         Visual Fields (Counting fingers)       Left Right    Full Full         Extraocular Movement       Right Left    Full, Ortho Full, Ortho         Neuro/Psych     Oriented x3: Yes   Mood/Affect: Normal         Dilation     Both eyes: 1.0% Mydriacyl, 2.5% Phenylephrine  @ 1:00 PM           Slit Lamp and Fundus Exam     Slit Lamp Exam       Right Left   Lids/Lashes Dermatochalasis - upper lid, Meibomian gland dysfunction Dermatochalasis - upper lid, Meibomian gland dysfunction   Conjunctiva/Sclera White and quiet White and quiet   Cornea Mild Arcus, Well healed temporal cataract wounds, mild tear film debris Mild Arcus, EBMD, trace Punctate epithelial erosions, Well healed temporal cataract wounds, mild tear film debris   Anterior Chamber Deep, 1+cell/pigment Deep and quiet   Iris Round and moderately dilated, No NVI, PPM nasally Round and dilated, No NVI   Lens PC IOL in good position, trace Posterior capsular opacification PC IOL in good position, trace PCO   Anterior Vitreous Vitreous syneresis, mild vitreous condensations, Posterior vitreous detachment, old, white VH settled inferiorly Vitreous syneresis         Fundus Exam       Right Left   Disc mild pallor, Sharp rim mild pallor, Sharp rim   C/D Ratio 0.4 0.4   Macula Flat, good foveal reflex, persistent cystic changes / edema inferotemporal fovea and mac, punctate MA and exudates IT mac, ERM temporal macula Flat, good foveal reflex, epiretinal  membrane, punctate exudates and edema temporal macula -- stably improved, minimal MA, no frank edema   Vessels attenuated, Tortuous attenuated, Tortuous   Periphery Attached, scattered MA's, old VH attached, scattered DBH greatest nasally and IN, focal exudates  nasal midzone - improved           IMAGING AND PROCEDURES  Imaging and Procedures for 12/10/17  OCT, Retina - OU - Both Eyes       Right Eye Quality was good. Central Foveal Thickness: 370. Progression has worsened. Findings include normal foveal contour, no SRF, intraretinal hyper-reflective material, intraretinal fluid (Persistent IRF inferotemporal fovea and mac-- slightly increased).   Left Eye Quality was good. Central Foveal Thickness: 344. Progression has been stable. Findings include no SRF, abnormal foveal contour, intraretinal hyper-reflective material, epiretinal membrane, intraretinal fluid, macular pucker, vitreomacular adhesion (ERM with blunting of foveal contour; stable improvement in [facility] and cystic changes temporal mac).   Notes **Images taken, stored on drive**  Diagnosis / Impression:  DME OU (OD>OS) OD: Persistent IRF inferotemporal fovea and mac-- slightly increased OS: ERM with blunting of foveal contour; stable improvement in [facility] and cystic changes temporal mac  Clinical management:  See below  Abbreviations: NFP - Normal foveal profile. CME - cystoid macular edema. PED - pigment epithelial detachment. IRF - intraretinal fluid. SRF - subretinal fluid. EZ - ellipsoid zone. ERM - epiretinal membrane. ORA - outer retinal atrophy. ORT - outer retinal tubulation. SRHM - subretinal hyper-reflective material       Intravitreal Injection, Pharmacologic Agent - OD - Right Eye       Time Out 05/18/2024. 1:27 PM. Confirmed correct patient, procedure, site, and patient consented.   Anesthesia Topical anesthesia was used. Anesthetic medications included Lidocaine  2%, Proparacaine 0.5%.    Procedure Preparation included 5% betadine  to ocular surface, eyelid speculum. A supplied needle was used.   Injection: 6 mg faricimab-svoa 6 MG/0.05ML (Patient supplied)   Route: Intravitreal, Site: Right Eye   NDC: 49757-903-98, Lot: [id number], Expiration date: 05/10/2026, Waste: 0 mL   Post-op Post injection exam found visual acuity of at least counting fingers. The patient tolerated the procedure well. There were no complications. The patient received written and verbal post procedure care education.   Notes **SAMPLE MEDICATION ADMINISTERED**           ASSESSMENT/PLAN:   ICD-10-CM   1. 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 Posterior vitreous detachment of right eye  H43.811     3. Essential hypertension  I10     4. Hypertensive retinopathy of both eyes  H35.033     5. Epiretinal membrane (ERM) of left eye  H35.372     6. Pseudophakia of both eyes  Z96.1      1. Moderate nonproliferative diabetic retinopathy, both eyes  - Diabetic macular edema, both eyes -- relatively mild - S/P IVA OD #1 (12.17.18), #2 (01.28.19), #3 (02.27.19), #4 (04.03.19), #5 (05.01.19), #6 (05.27.20), #7 (07.08.20), #8 (06.18.25), #9 (08.13.25)  ========== - in reviewing her case, there was minimal response to IVA in OCT or BCVA  - S/P IVTA OD #1 (05.29.19), #2 (07.24.19), #3 (09.04.19), #4 (12.04.19), #5 (02.26.20), # 6 (08.05.20), # 7(09.30.20), #8 (12.23.20)   ========================== - s/p IVE OD #1 (03.03.21), #2 (03.31.21), #3 (04.28.21), #4 (05.28.21), #5 (08.04.21), #6 (09.08.21), #7 (10.06.21), #8 (12.15.21), #9 (02.09.22), #10 (05.11.22), #11 (8.24.22), #12 (10.05.22), #13 (12.14.22), #14 (02.22.23), #15 (07.26.23), #16  (07.26.23), #17 (10.04.23), #18 (12.20.23), #19 (03.11.24), #20 (12.18.24) - s/p  IVE OS #1 (04.28.21), #2 (05.28.21), #3 (12.15.21), #4 (02.09.22), #5 (05.10.23), #6 (07.26.23), #7 (07.26.23), #8 (10.04.23), #9 (12.20.23), #10 (03.11.24)  - S/P focal laser OS (09.02.20)  - s/p focal laser OD (11.17.21) - OCT shows OD: Persistent IRF inferotemporal fovea and mac-- slightly increased; OS: ERM with blunting of foveal contour; stable improvement in [facility] and cystic changes temporal mac at 8 weeks  - BCVA OD: 20/25- stable; OS stable at 20/20 - recommend IVV OD #01 [sample] today 10.08.25 w/ f/u in 8 wks ([employer] funding unavailable)  - pt wishes to proceed with injection  - RBA of procedure discussed, questions answered - informed consent obtained and signed - see procedure note - IVA informed consent obtained and re-signed (06.18.25) - IVV informed consent obtained and signed (10.08.25) OD  - f/u 8 weeks, sooner prn -- DFE/OCT possible injection(s)  2. PVD / vitreous syneresis OD  - Discussed findings and prognosis  - No RT or RD on 360 scleral depressed exam  - Reviewed signs/symptoms of RT/RD  - Strict return precautions for any such RT/RD signs/symptoms   3. Epiretinal membrane, left eye  - mild ERM with VMA/VMT component - BCVA remains 20/20 - still asymptomatic, no metamorphopsia - no indication for surgery at this time - continue to monitor  4,5. Hypertensive retinopathy OU  - discussed importance of tight BP control  - continue to monitor  6. Pseudophakia OU  - s/p CE/IOL OU (Dr. Harrie, March/June 2020)  - beautiful surgeries, doing well  - continue to monitor  Ophthalmic Meds Ordered this visit:  Meds ordered this encounter  Medications   faricimab-svoa (VABYSMO) 6mg /0.21mL intravitreal injection     Return in about 8 weeks (around 07/13/2024) for f/u, NPDR, DFE, OCT, Possible, IVV, OD.  There are no Patient Instructions on file for this visit.   This document  serves as a record of services personally performed by Redell JUDITHANN Hans, MD, PhD. It was created on their behalf by Almetta Pesa, an ophthalmic technician. The creation of this record is the provider's dictation and/or activities during the visit.    Electronically signed by: Almetta Pesa, OA, 05/22/24  3:59 PM  This document serves as a record of services personally performed by Redell JUDITHANN Hans, MD, PhD. It was created on their behalf by Wanda GEANNIE Keens, COT an ophthalmic technician. The creation of this record is the provider's dictation and/or activities during the visit.    Electronically signed by:  Wanda GEANNIE Keens, COT  05/22/24 3:59 PM  Redell JUDITHANN Hans, M.D., Ph.D. Diseases & Surgery of the Retina and Vitreous [facility]  I have reviewed the above documentation for accuracy and completeness, and I agree with the above. Redell JUDITHANN Hans, M.D., Ph.D. 05/22/24 4:01 PM   Abbreviations: M myopia (nearsighted); A astigmatism; H hyperopia (farsighted); P presbyopia; Mrx spectacle prescription;  CTL contact lenses; OD right eye; OS left eye; OU both eyes  XT exotropia; ET esotropia; PEK punctate epithelial keratitis; PEE punctate epithelial erosions; DES dry eye syndrome; MGD meibomian gland dysfunction; ATs artificial tears; PFAT's preservative free artificial tears; NSC nuclear sclerotic cataract; PSC posterior subcapsular cataract; ERM epi-retinal membrane; PVD posterior vitreous detachment; RD retinal detachment; DM diabetes mellitus; DR diabetic retinopathy; NPDR non-proliferative diabetic retinopathy; PDR proliferative diabetic retinopathy; CSME clinically significant macular edema; DME diabetic macular edema; dbh dot blot hemorrhages; CWS cotton wool spot; POAG primary open angle glaucoma; C/D cup-to-disc ratio; HVF humphrey visual field; GVF goldmann visual  field; OCT optical coherence tomography; IOP intraocular pressure; BRVO Branch retinal vein  occlusion; CRVO central retinal vein occlusion; CRAO central retinal artery occlusion; BRAO branch retinal artery occlusion; RT retinal tear; SB scleral buckle; PPV pars plana vitrectomy; VH Vitreous hemorrhage; PRP panretinal laser photocoagulation; IVK intravitreal kenalog ; VMT vitreomacular traction; MH Macular hole;  NVD neovascularization of the disc; NVE neovascularization elsewhere; AREDS age related eye disease study; ARMD age related macular degeneration; POAG primary open angle glaucoma; EBMD epithelial/anterior basement membrane dystrophy; ACIOL anterior chamber intraocular lens; IOL intraocular lens; PCIOL posterior chamber intraocular lens; Phaco/IOL phacoemulsification with intraocular lens placement; PRK photorefractive keratectomy; LASIK laser assisted in situ keratomileusis; HTN hypertension; DM diabetes mellitus; COPD chronic obstructive pulmonary disease

## 2024-05-18 ENCOUNTER — Ambulatory Visit (INDEPENDENT_AMBULATORY_CARE_PROVIDER_SITE_OTHER): Admitting: Ophthalmology

## 2024-05-18 ENCOUNTER — Encounter (INDEPENDENT_AMBULATORY_CARE_PROVIDER_SITE_OTHER): Payer: Self-pay | Admitting: Ophthalmology

## 2024-05-18 DIAGNOSIS — H35372 Puckering of macula, left eye: Secondary | ICD-10-CM

## 2024-05-18 DIAGNOSIS — H43811 Vitreous degeneration, right eye: Secondary | ICD-10-CM | POA: Diagnosis not present

## 2024-05-18 DIAGNOSIS — H35033 Hypertensive retinopathy, bilateral: Secondary | ICD-10-CM

## 2024-05-18 DIAGNOSIS — E113313 Type 2 diabetes mellitus with moderate nonproliferative diabetic retinopathy with macular edema, bilateral: Secondary | ICD-10-CM | POA: Diagnosis not present

## 2024-05-18 DIAGNOSIS — Z961 Presence of intraocular lens: Secondary | ICD-10-CM

## 2024-05-18 DIAGNOSIS — I10 Essential (primary) hypertension: Secondary | ICD-10-CM

## 2024-05-18 MED ORDER — FARICIMAB-SVOA 6 MG/0.05ML IZ SOLN
6.0000 mg | INTRAVITREAL | Status: AC | PRN
  Administered 2024-05-18: 6 mg via INTRAVITREAL

## 2024-07-06 NOTE — Progress Notes (Signed)
 [facility] - Clinic Note  07/13/2024     CHIEF COMPLAINT Patient presents for Retina Follow Up    HISTORY OF PRESENT ILLNESS: Carolyn Hunter is a 73 y.o. female who presents to the clinic today for:   HPI     Retina Follow Up   Patient presents with  Diabetic Retinopathy.  In both eyes.  This started 8 weeks ago.  I, the attending physician,  performed the HPI with the patient and updated documentation appropriately.        Comments   Patient here for 8 weeks retina follow up for NPDR OU. Patient states vision about the same. No eye pain.       Last edited by Valdemar Rogue, MD on 07/13/2024  5:27 PM.    Pt states the vision is about the same.   Referring physician: Orpha Yancey LABOR, MD [address] [place],  [place] [address]  HISTORICAL INFORMATION:   Selected notes from the MEDICAL RECORD NUMBER Referral from Dr. EMERSON Kawasaki for DM exam   CURRENT MEDICATIONS: Current Outpatient Medications (Ophthalmic Drugs)  Medication Sig   Aflibercept  (EYLEA  IO) Inject 1 Dose into the eye every 30 (thirty) days. Injectable med given @ retina office q 12 wks.   No current facility-administered medications for this visit. (Ophthalmic Drugs)   Current Outpatient Medications (Other)  Medication Sig   amLODipine  (NORVASC ) 5 MG tablet TAKE 1 TABLET BY MOUTH EVERY DAY   Cholecalciferol (VITAMIN D3) 50 MCG (2000 UT) TABS Take 2,000 Units by mouth daily.   Liniments (SALONPAS PAIN RELIEF PATCH EX) Place 1 patch onto the skin daily as needed (pain.).   metFORMIN  (GLUCOPHAGE ) 1000 MG tablet TAKE 1 TABLET BY MOUTH EVERY DAY WITH BREAKFAST   propranolol  ER (INDERAL  LA) 60 MG 24 hr capsule TAKE 1 CAPSULE BY MOUTH EVERY DAY   rosuvastatin  (CRESTOR ) 40 MG tablet Take 1 tablet (40 mg total) by mouth daily.   No current facility-administered medications for this visit. (Other)   REVIEW OF SYSTEMS: ROS   Positive for: Neurological, Endocrine, Eyes Negative for:  Constitutional, Gastrointestinal, Skin, Genitourinary, Musculoskeletal, HENT, Cardiovascular, Respiratory, Psychiatric, Allergic/Imm, Heme/Lymph Last edited by Orval Asberry RAMAN, COA on 07/13/2024  1:07 PM.      ALLERGIES Allergies  Allergen Reactions   Ace Inhibitors Cough   Zetia  [Ezetimibe ] Swelling    Sore throat   PAST MEDICAL HISTORY Past Medical History:  Diagnosis Date   Anxiety    Cataract    OS   Diabetes mellitus without complication (HCC)    Diabetic retinopathy (HCC)    NPDR OU   Hyperlipidemia    Hypertension    Hypertensive retinopathy    OU   Tremors of nervous system    just when I get in a nervous Situation.   Past Surgical History:  Procedure Laterality Date   CATARACT EXTRACTION W/PHACO Right 10/18/2018   Procedure: CATARACT EXTRACTION PHACO AND INTRAOCULAR LENS PLACEMENT (IOC);  Surgeon: Harrie Agent, MD;  Location: [facility];  Service: Ophthalmology;  Laterality: Right;  CDE: 8.11   CATARACT EXTRACTION W/PHACO Left 01/17/2019   Procedure: CATARACT EXTRACTION PHACO AND INTRAOCULAR LENS PLACEMENT (IOC);  Surgeon: Harrie Agent, MD;  Location: [facility];  Service: Ophthalmology;  Laterality: Left;  CDE: 6.38   CESAREAN SECTION     1988   COLONOSCOPY WITH PROPOFOL  N/A 05/04/2020   Procedure: COLONOSCOPY WITH PROPOFOL ;  Surgeon: Eartha Angelia Sieving, MD;  Location: [facility];  Service:  Gastroenterology;  Laterality: N/A;  830   EYE SURGERY Right    Cataract extraction OD   POLYPECTOMY  05/04/2020   Procedure: POLYPECTOMY;  Surgeon: Eartha Flavors, Toribio, MD;  Location: [facility];  Service: Gastroenterology;;   FAMILY HISTORY Family History  Problem Relation Age of Onset   Cancer Mother        breast and colon cancer   Breast cancer Mother    Diabetes Brother    Diabetes Son    Asthma Father        COPD   Diabetes Son    SOCIAL HISTORY Social History   Tobacco Use   Smoking status: Never   Smokeless tobacco: Never  Vaping Use    Vaping status: Never Used  Substance Use Topics   Alcohol  use: No   Drug use: No       OPHTHALMIC EXAM:  Base Eye Exam     Visual Acuity (Snellen - Linear)       Right Left   Dist [place] 20/30 20/20   Dist ph [place] 20/25 -1          Tonometry (Tonopen, 1:05 PM)       Right Left   Pressure 13 12         Pupils       Dark Light Shape React APD   Right 3 2 Round Brisk None   Left 3 2 Round Brisk None         Visual Fields (Counting fingers)       Left Right    Full Full         Extraocular Movement       Right Left    Full, Ortho Full, Ortho         Neuro/Psych     Oriented x3: Yes   Mood/Affect: Normal         Dilation     Both eyes: 1.0% Mydriacyl, 2.5% Phenylephrine  @ 1:05 PM           Slit Lamp and Fundus Exam     Slit Lamp Exam       Right Left   Lids/Lashes Dermatochalasis - upper lid, Meibomian gland dysfunction Dermatochalasis - upper lid, Meibomian gland dysfunction   Conjunctiva/Sclera White and quiet White and quiet   Cornea Mild Arcus, Well healed temporal cataract wounds, mild tear film debris Mild Arcus, EBMD, trace Punctate epithelial erosions, Well healed temporal cataract wounds, mild tear film debris   Anterior Chamber Deep, 1+cell/pigment Deep and quiet   Iris Round and moderately dilated, No NVI, PPM nasally Round and dilated, No NVI   Lens PC IOL in good position, trace Posterior capsular opacification PC IOL in good position, trace PCO   Anterior Vitreous Vitreous syneresis, mild vitreous condensations, Posterior vitreous detachment, old, white VH settled inferiorly Vitreous syneresis         Fundus Exam       Right Left   Disc mild pallor, Sharp rim mild pallor, Sharp rim   C/D Ratio 0.4 0.4   Macula Flat, good foveal reflex, persistent cystic changes / edema inferotemporal fovea and mac, punctate MA and exudates IT mac, ERM temporal macula Flat, good foveal reflex, epiretinal membrane, punctate exudates and edema  temporal macula -- stably improved, minimal MA, no frank edema   Vessels attenuated, Tortuous attenuated, Tortuous   Periphery Attached, scattered MA's, old VH attached, scattered DBH greatest nasally and IN, focal exudates nasal midzone - improved  IMAGING AND PROCEDURES  Imaging and Procedures for 12/10/17  OCT, Retina - OU - Both Eyes       Right Eye Quality was good. Central Foveal Thickness: 370. Progression has improved. Findings include normal foveal contour, no SRF, intraretinal hyper-reflective material, intraretinal fluid (Persistent IRF inferotemporal fovea and mac-- slightly improved).   Left Eye Quality was good. Central Foveal Thickness: 344. Progression has been stable. Findings include no SRF, abnormal foveal contour, intraretinal hyper-reflective material, epiretinal membrane, intraretinal fluid, macular pucker, vitreomacular adhesion (ERM with blunting of foveal contour; stable improvement in [facility] and cystic changes temporal mac).   Notes **Images taken, stored on drive**  Diagnosis / Impression:  DME OU (OD>OS) OD: Persistent IRF inferotemporal fovea and mac-- slightly improved OS: ERM with blunting of foveal contour; stable improvement in [facility] and cystic changes temporal mac  Clinical management:  See below  Abbreviations: NFP - Normal foveal profile. CME - cystoid macular edema. PED - pigment epithelial detachment. IRF - intraretinal fluid. SRF - subretinal fluid. EZ - ellipsoid zone. ERM - epiretinal membrane. ORA - outer retinal atrophy. ORT - outer retinal tubulation. SRHM - subretinal hyper-reflective material       Intravitreal Injection, Pharmacologic Agent - OD - Right Eye       Time Out 07/13/2024. 1:50 PM. Confirmed correct patient, procedure, site, and patient consented.   Anesthesia Topical anesthesia was used. Anesthetic medications included Lidocaine  2%, Proparacaine 0.5%.   Procedure Preparation included 5% betadine  to ocular  surface, eyelid speculum. A supplied needle was used.   Injection: 6 mg faricimab -svoa 6 MG/0.05ML (Patient supplied)   Route: Intravitreal, Site: Right Eye   NDC: 49757-903-98, Lot: [id number], Expiration date: 05/10/2026, Waste: 0 mL   Post-op Post injection exam found visual acuity of at least counting fingers. The patient tolerated the procedure well. There were no complications. The patient received written and verbal post procedure care education.   Notes **SAMPLE MEDICATION ADMINISTERED**           ASSESSMENT/PLAN:   ICD-10-CM   1. Moderate nonproliferative diabetic retinopathy of both eyes with macular edema associated with type 2 diabetes mellitus (HCC)  E11.3313 OCT, Retina - OU - Both Eyes    Intravitreal Injection, Pharmacologic Agent - OD - Right Eye    faricimab -svoa (VABYSMO ) 6mg /0.64mL intravitreal injection    2. Posterior vitreous detachment of right eye  H43.811     3. Essential hypertension  I10     4. Hypertensive retinopathy of both eyes  H35.033     5. Epiretinal membrane (ERM) of left eye  H35.372     6. Pseudophakia of both eyes  Z96.1      1. Moderate nonproliferative diabetic retinopathy, both eyes  - Diabetic macular edema, both eyes -- relatively mild - S/P IVA OD #1 (12.17.18), #2 (01.28.19), #3 (02.27.19), #4 (04.03.19), #5 (05.01.19), #6 (05.27.20), #7 (07.08.20), #8 (06.18.25), #9 (08.13.25) - in reviewing her case, there was minimal response to IVA in OCT or BCVA  - S/P IVTA OD #1 (05.29.19), #2 (07.24.19), #3 (09.04.19), #4 (12.04.19), #5 (02.26.20), # 6 (08.05.20), # 7(09.30.20), #8 (12.23.20)   ========================== - s/p IVE OD #1 (03.03.21), #2 (03.31.21), #3 (04.28.21), #4 (05.28.21), #5 (08.04.21), #6 (09.08.21), #7 (10.06.21), #8 (12.15.21), #9 (02.09.22), #10 (05.11.22), #11 (8.24.22), #12 (10.05.22), #13 (12.14.22), #14 (02.22.23), #15 (07.26.23), #16 (07.26.23), #17 (10.04.23), #18 (12.20.23), #19 (03.11.24), #20 (12.18.24) -  s/p IVE OS #1 (04.28.21), #2 (05.28.21), #3 (12.15.21), #4 (02.09.22), #5 (05.10.23), #6 (07.26.23), #7 (07.26.23), #  8 (10.04.23), #9 (12.20.23), #10 (03.11.24) ========================== - s/p IVV OD #1 (sample--10.08.25)  - S/P focal laser OS (09.02.20)  - s/p focal laser OD (11.17.21) - OCT shows OD: Persistent IRF inferotemporal fovea and mac-- slightly improved; OS: ERM with blunting of foveal contour; stable improvement in [facility] and cystic changes temporal mac at 8 weeks  - BCVA OD: 20/25- stable; OS stable at 20/20 - recommend IVV OD #2 [sample] today 12.03.25 w/ f/u dec to 6 wks ([employer] funding unavailable)  - pt wishes to proceed with injection  - RBA of procedure discussed, questions answered - informed consent obtained and signed - see procedure note - IVA informed consent obtained and re-signed (06.18.25) - IVV informed consent obtained and signed (10.08.25) OD  - f/u 6 weeks, sooner prn -- DFE/OCT possible injection(s)  2. PVD / vitreous syneresis OD  - Discussed findings and prognosis  - No RT or RD on 360 scleral depressed exam  - Reviewed signs/symptoms of RT/RD  - Strict return precautions for any such RT/RD signs/symptoms   3. Epiretinal membrane, left eye  - mild ERM with VMA/VMT component - BCVA remains 20/20 - still asymptomatic, no metamorphopsia - no indication for surgery at this time - continue to monitor  4,5. Hypertensive retinopathy OU  - discussed importance of tight BP control  - continue to monitor  6. Pseudophakia OU  - s/p CE/IOL OU (Dr. Harrie, March/June 2020)  - beautiful surgeries, doing well  - continue to monitor  Ophthalmic Meds Ordered this visit:  Meds ordered this encounter  Medications   faricimab -svoa (VABYSMO ) 6mg /0.13mL intravitreal injection     Return in about 6 weeks (around 08/24/2024) for f/u, NPDR, DFE, OCT, Possible, IVV, OD.  There are no Patient Instructions on file for this visit.   This document serves as a  record of services personally performed by Redell JUDITHANN Hans, MD, PhD. It was created on their behalf by Almetta Pesa, an ophthalmic technician. The creation of this record is the provider's dictation and/or activities during the visit.    Electronically signed by: Almetta Pesa, OA, 07/17/24  4:15 PM  This document serves as a record of services personally performed by Redell JUDITHANN Hans, MD, PhD. It was created on their behalf by Wanda GEANNIE Keens, COT an ophthalmic technician. The creation of this record is the provider's dictation and/or activities during the visit.    Electronically signed by:  Wanda GEANNIE Keens, COT  07/17/24 4:15 PM  Redell JUDITHANN Hans, M.D., Ph.D. Diseases & Surgery of the Retina and Vitreous [facility]  I have reviewed the above documentation for accuracy and completeness, and I agree with the above. Redell JUDITHANN Hans, M.D., Ph.D. 07/17/24 4:28 PM   Abbreviations: M myopia (nearsighted); A astigmatism; H hyperopia (farsighted); P presbyopia; Mrx spectacle prescription;  CTL contact lenses; OD right eye; OS left eye; OU both eyes  XT exotropia; ET esotropia; PEK punctate epithelial keratitis; PEE punctate epithelial erosions; DES dry eye syndrome; MGD meibomian gland dysfunction; ATs artificial tears; PFAT's preservative free artificial tears; NSC nuclear sclerotic cataract; PSC posterior subcapsular cataract; ERM epi-retinal membrane; PVD posterior vitreous detachment; RD retinal detachment; DM diabetes mellitus; DR diabetic retinopathy; NPDR non-proliferative diabetic retinopathy; PDR proliferative diabetic retinopathy; CSME clinically significant macular edema; DME diabetic macular edema; dbh dot blot hemorrhages; CWS cotton wool spot; POAG primary open angle glaucoma; C/D cup-to-disc ratio; HVF humphrey visual field; GVF goldmann visual field; OCT optical coherence tomography; IOP intraocular pressure;  BRVO Branch retinal vein occlusion; CRVO  central retinal vein occlusion; CRAO central retinal artery occlusion; BRAO branch retinal artery occlusion; RT retinal tear; SB scleral buckle; PPV pars plana vitrectomy; VH Vitreous hemorrhage; PRP panretinal laser photocoagulation; IVK intravitreal kenalog ; VMT vitreomacular traction; MH Macular hole;  NVD neovascularization of the disc; NVE neovascularization elsewhere; AREDS age related eye disease study; ARMD age related macular degeneration; POAG primary open angle glaucoma; EBMD epithelial/anterior basement membrane dystrophy; ACIOL anterior chamber intraocular lens; IOL intraocular lens; PCIOL posterior chamber intraocular lens; Phaco/IOL phacoemulsification with intraocular lens placement; PRK photorefractive keratectomy; LASIK laser assisted in situ keratomileusis; HTN hypertension; DM diabetes mellitus; COPD chronic obstructive pulmonary disease

## 2024-07-13 ENCOUNTER — Encounter (INDEPENDENT_AMBULATORY_CARE_PROVIDER_SITE_OTHER): Payer: Self-pay | Admitting: Ophthalmology

## 2024-07-13 ENCOUNTER — Ambulatory Visit (INDEPENDENT_AMBULATORY_CARE_PROVIDER_SITE_OTHER): Admitting: Ophthalmology

## 2024-07-13 DIAGNOSIS — E113313 Type 2 diabetes mellitus with moderate nonproliferative diabetic retinopathy with macular edema, bilateral: Secondary | ICD-10-CM

## 2024-07-13 DIAGNOSIS — H35372 Puckering of macula, left eye: Secondary | ICD-10-CM

## 2024-07-13 DIAGNOSIS — Z961 Presence of intraocular lens: Secondary | ICD-10-CM

## 2024-07-13 DIAGNOSIS — I10 Essential (primary) hypertension: Secondary | ICD-10-CM | POA: Diagnosis not present

## 2024-07-13 DIAGNOSIS — H35033 Hypertensive retinopathy, bilateral: Secondary | ICD-10-CM

## 2024-07-13 DIAGNOSIS — H43811 Vitreous degeneration, right eye: Secondary | ICD-10-CM | POA: Diagnosis not present

## 2024-07-13 MED ORDER — FARICIMAB-SVOA 6 MG/0.05ML IZ SOLN
6.0000 mg | INTRAVITREAL | Status: AC | PRN
  Administered 2024-07-13: 6 mg via INTRAVITREAL

## 2024-07-25 ENCOUNTER — Emergency Department (HOSPITAL_COMMUNITY)

## 2024-07-25 ENCOUNTER — Inpatient Hospital Stay (HOSPITAL_COMMUNITY)

## 2024-07-25 ENCOUNTER — Encounter (HOSPITAL_COMMUNITY): Payer: Self-pay | Admitting: *Deleted

## 2024-07-25 ENCOUNTER — Inpatient Hospital Stay (HOSPITAL_COMMUNITY)
Admission: EM | Admit: 2024-07-25 | Discharge: 2024-08-02 | DRG: 871 | Disposition: A | Discharge status: Rehabilitation Facility | Attending: Family Medicine | Admitting: Family Medicine

## 2024-07-25 ENCOUNTER — Other Ambulatory Visit: Payer: Self-pay

## 2024-07-25 DIAGNOSIS — R569 Unspecified convulsions: Secondary | ICD-10-CM | POA: Diagnosis present

## 2024-07-25 DIAGNOSIS — S0511XA Contusion of eyeball and orbital tissues, right eye, initial encounter: Secondary | ICD-10-CM | POA: Diagnosis present

## 2024-07-25 DIAGNOSIS — D649 Anemia, unspecified: Secondary | ICD-10-CM | POA: Diagnosis present

## 2024-07-25 DIAGNOSIS — K219 Gastro-esophageal reflux disease without esophagitis: Secondary | ICD-10-CM | POA: Diagnosis present

## 2024-07-25 DIAGNOSIS — G4089 Other seizures: Secondary | ICD-10-CM | POA: Diagnosis not present

## 2024-07-25 DIAGNOSIS — Z833 Family history of diabetes mellitus: Secondary | ICD-10-CM

## 2024-07-25 DIAGNOSIS — G9341 Metabolic encephalopathy: Secondary | ICD-10-CM

## 2024-07-25 DIAGNOSIS — R29725 NIHSS score 25: Secondary | ICD-10-CM | POA: Diagnosis present

## 2024-07-25 DIAGNOSIS — E871 Hypo-osmolality and hyponatremia: Secondary | ICD-10-CM | POA: Diagnosis present

## 2024-07-25 DIAGNOSIS — Z7984 Long term (current) use of oral hypoglycemic drugs: Secondary | ICD-10-CM | POA: Diagnosis not present

## 2024-07-25 DIAGNOSIS — G8191 Hemiplegia, unspecified affecting right dominant side: Secondary | ICD-10-CM | POA: Diagnosis present

## 2024-07-25 DIAGNOSIS — I4891 Unspecified atrial fibrillation: Secondary | ICD-10-CM | POA: Diagnosis present

## 2024-07-25 DIAGNOSIS — M6282 Rhabdomyolysis: Secondary | ICD-10-CM

## 2024-07-25 DIAGNOSIS — H35039 Hypertensive retinopathy, unspecified eye: Secondary | ICD-10-CM | POA: Diagnosis present

## 2024-07-25 DIAGNOSIS — I10 Essential (primary) hypertension: Secondary | ICD-10-CM | POA: Diagnosis present

## 2024-07-25 DIAGNOSIS — R4020 Unspecified coma: Secondary | ICD-10-CM | POA: Diagnosis present

## 2024-07-25 DIAGNOSIS — E113213 Type 2 diabetes mellitus with mild nonproliferative diabetic retinopathy with macular edema, bilateral: Secondary | ICD-10-CM | POA: Diagnosis present

## 2024-07-25 DIAGNOSIS — W19XXXA Unspecified fall, initial encounter: Secondary | ICD-10-CM | POA: Diagnosis present

## 2024-07-25 DIAGNOSIS — R233 Spontaneous ecchymoses: Secondary | ICD-10-CM | POA: Diagnosis not present

## 2024-07-25 DIAGNOSIS — J9811 Atelectasis: Secondary | ICD-10-CM | POA: Diagnosis present

## 2024-07-25 DIAGNOSIS — E1165 Type 2 diabetes mellitus with hyperglycemia: Secondary | ICD-10-CM | POA: Diagnosis present

## 2024-07-25 DIAGNOSIS — I69359 Hemiplegia and hemiparesis following cerebral infarction affecting unspecified side: Secondary | ICD-10-CM | POA: Diagnosis not present

## 2024-07-25 DIAGNOSIS — E1151 Type 2 diabetes mellitus with diabetic peripheral angiopathy without gangrene: Secondary | ICD-10-CM | POA: Diagnosis not present

## 2024-07-25 DIAGNOSIS — R1310 Dysphagia, unspecified: Secondary | ICD-10-CM | POA: Diagnosis present

## 2024-07-25 DIAGNOSIS — I6389 Other cerebral infarction: Secondary | ICD-10-CM | POA: Diagnosis not present

## 2024-07-25 DIAGNOSIS — G8384 Todd's paralysis (postepileptic): Secondary | ICD-10-CM | POA: Diagnosis not present

## 2024-07-25 DIAGNOSIS — E113211 Type 2 diabetes mellitus with mild nonproliferative diabetic retinopathy with macular edema, right eye: Secondary | ICD-10-CM | POA: Diagnosis not present

## 2024-07-25 DIAGNOSIS — G9340 Encephalopathy, unspecified: Secondary | ICD-10-CM | POA: Diagnosis not present

## 2024-07-25 DIAGNOSIS — N390 Urinary tract infection, site not specified: Secondary | ICD-10-CM | POA: Diagnosis present

## 2024-07-25 DIAGNOSIS — Z794 Long term (current) use of insulin: Secondary | ICD-10-CM | POA: Diagnosis not present

## 2024-07-25 DIAGNOSIS — F1910 Other psychoactive substance abuse, uncomplicated: Secondary | ICD-10-CM | POA: Diagnosis present

## 2024-07-25 DIAGNOSIS — J9601 Acute respiratory failure with hypoxia: Secondary | ICD-10-CM | POA: Diagnosis present

## 2024-07-25 DIAGNOSIS — I639 Cerebral infarction, unspecified: Secondary | ICD-10-CM

## 2024-07-25 DIAGNOSIS — I63512 Cerebral infarction due to unspecified occlusion or stenosis of left middle cerebral artery: Secondary | ICD-10-CM | POA: Diagnosis not present

## 2024-07-25 DIAGNOSIS — Z7982 Long term (current) use of aspirin: Secondary | ICD-10-CM

## 2024-07-25 DIAGNOSIS — F419 Anxiety disorder, unspecified: Secondary | ICD-10-CM | POA: Diagnosis present

## 2024-07-25 DIAGNOSIS — G40909 Epilepsy, unspecified, not intractable, without status epilepticus: Secondary | ICD-10-CM | POA: Diagnosis not present

## 2024-07-25 DIAGNOSIS — I69351 Hemiplegia and hemiparesis following cerebral infarction affecting right dominant side: Secondary | ICD-10-CM | POA: Diagnosis not present

## 2024-07-25 DIAGNOSIS — I63312 Cerebral infarction due to thrombosis of left middle cerebral artery: Secondary | ICD-10-CM | POA: Diagnosis not present

## 2024-07-25 DIAGNOSIS — E1169 Type 2 diabetes mellitus with other specified complication: Secondary | ICD-10-CM | POA: Diagnosis not present

## 2024-07-25 DIAGNOSIS — M7989 Other specified soft tissue disorders: Secondary | ICD-10-CM | POA: Diagnosis present

## 2024-07-25 DIAGNOSIS — Z79899 Other long term (current) drug therapy: Secondary | ICD-10-CM

## 2024-07-25 DIAGNOSIS — Z800 Family history of malignant neoplasm of digestive organs: Secondary | ICD-10-CM

## 2024-07-25 DIAGNOSIS — T3695XA Adverse effect of unspecified systemic antibiotic, initial encounter: Secondary | ICD-10-CM | POA: Diagnosis not present

## 2024-07-25 DIAGNOSIS — Z888 Allergy status to other drugs, medicaments and biological substances status: Secondary | ICD-10-CM

## 2024-07-25 DIAGNOSIS — E119 Type 2 diabetes mellitus without complications: Secondary | ICD-10-CM | POA: Diagnosis not present

## 2024-07-25 DIAGNOSIS — R8271 Bacteriuria: Secondary | ICD-10-CM | POA: Diagnosis not present

## 2024-07-25 DIAGNOSIS — R4182 Altered mental status, unspecified: Secondary | ICD-10-CM | POA: Diagnosis not present

## 2024-07-25 DIAGNOSIS — S0011XA Contusion of right eyelid and periocular area, initial encounter: Secondary | ICD-10-CM | POA: Diagnosis not present

## 2024-07-25 DIAGNOSIS — J690 Pneumonitis due to inhalation of food and vomit: Secondary | ICD-10-CM | POA: Diagnosis present

## 2024-07-25 DIAGNOSIS — R6521 Severe sepsis with septic shock: Secondary | ICD-10-CM | POA: Diagnosis present

## 2024-07-25 DIAGNOSIS — I6622 Occlusion and stenosis of left posterior cerebral artery: Secondary | ICD-10-CM | POA: Diagnosis present

## 2024-07-25 DIAGNOSIS — R197 Diarrhea, unspecified: Secondary | ICD-10-CM | POA: Diagnosis not present

## 2024-07-25 DIAGNOSIS — A419 Sepsis, unspecified organism: Principal | ICD-10-CM | POA: Diagnosis present

## 2024-07-25 DIAGNOSIS — M792 Neuralgia and neuritis, unspecified: Secondary | ICD-10-CM | POA: Diagnosis not present

## 2024-07-25 DIAGNOSIS — E785 Hyperlipidemia, unspecified: Secondary | ICD-10-CM | POA: Diagnosis present

## 2024-07-25 DIAGNOSIS — Z825 Family history of asthma and other chronic lower respiratory diseases: Secondary | ICD-10-CM

## 2024-07-25 DIAGNOSIS — H518 Other specified disorders of binocular movement: Secondary | ICD-10-CM | POA: Diagnosis present

## 2024-07-25 DIAGNOSIS — I6350 Cerebral infarction due to unspecified occlusion or stenosis of unspecified cerebral artery: Secondary | ICD-10-CM | POA: Diagnosis not present

## 2024-07-25 DIAGNOSIS — T474X5A Adverse effect of other laxatives, initial encounter: Secondary | ICD-10-CM | POA: Diagnosis not present

## 2024-07-25 DIAGNOSIS — I69391 Dysphagia following cerebral infarction: Secondary | ICD-10-CM | POA: Diagnosis not present

## 2024-07-25 DIAGNOSIS — Z803 Family history of malignant neoplasm of breast: Secondary | ICD-10-CM

## 2024-07-25 LAB — CBC WITH DIFFERENTIAL/PLATELET
Abs Immature Granulocytes: 0.12 K/uL — ABNORMAL HIGH (ref 0.00–0.07)
Basophils Absolute: 0 K/uL (ref 0.0–0.1)
Basophils Relative: 0 %
Eosinophils Absolute: 0 K/uL (ref 0.0–0.5)
Eosinophils Relative: 0 %
HCT: 39.9 % (ref 36.0–46.0)
Hemoglobin: 13.7 g/dL (ref 12.0–15.0)
Immature Granulocytes: 1 %
Lymphocytes Relative: 5 %
Lymphs Abs: 0.7 K/uL (ref 0.7–4.0)
MCH: 32.3 pg (ref 26.0–34.0)
MCHC: 34.3 g/dL (ref 30.0–36.0)
MCV: 94.1 fL (ref 80.0–100.0)
Monocytes Absolute: 1.5 K/uL — ABNORMAL HIGH (ref 0.1–1.0)
Monocytes Relative: 11 %
Neutro Abs: 11.2 K/uL — ABNORMAL HIGH (ref 1.7–7.7)
Neutrophils Relative %: 83 %
Platelets: 239 K/uL (ref 150–400)
RBC: 4.24 MIL/uL (ref 3.87–5.11)
RDW: 11.8 % (ref 11.5–15.5)
WBC: 13.5 K/uL — ABNORMAL HIGH (ref 4.0–10.5)
nRBC: 0 % (ref 0.0–0.2)

## 2024-07-25 LAB — TSH: TSH: 0.96 u[IU]/mL (ref 0.350–4.500)

## 2024-07-25 LAB — GLUCOSE, CAPILLARY
Glucose-Capillary: 233 mg/dL — ABNORMAL HIGH (ref 70–99)
Glucose-Capillary: 201 mg/dL — ABNORMAL HIGH (ref 70–99)
Glucose-Capillary: 225 mg/dL — ABNORMAL HIGH (ref 70–99)

## 2024-07-25 LAB — HEMOGLOBIN A1C
Hgb A1c MFr Bld: 6.4 % — ABNORMAL HIGH (ref 4.8–5.6)
Mean Plasma Glucose: 136.98 mg/dL

## 2024-07-25 LAB — BETA-HYDROXYBUTYRIC ACID: Beta-Hydroxybutyric Acid: 0.27 mmol/L (ref 0.05–0.27)

## 2024-07-25 LAB — I-STAT CHEM 8, ED
BUN: 10 mg/dL (ref 8–23)
Calcium, Ion: 1.1 mmol/L — ABNORMAL LOW (ref 1.15–1.40)
Chloride: 102 mmol/L (ref 98–111)
Creatinine, Ser: 0.8 mg/dL (ref 0.44–1.00)
Glucose, Bld: 284 mg/dL — ABNORMAL HIGH (ref 70–99)
HCT: 42 % (ref 36.0–46.0)
Hemoglobin: 14.3 g/dL (ref 12.0–15.0)
Potassium: 4.3 mmol/L (ref 3.5–5.1)
Sodium: 135 mmol/L (ref 135–145)
TCO2: 23 mmol/L (ref 22–32)

## 2024-07-25 LAB — COMPREHENSIVE METABOLIC PANEL WITH GFR
ALT: 19 U/L (ref 0–44)
AST: 48 U/L — ABNORMAL HIGH (ref 15–41)
Albumin: 3.7 g/dL (ref 3.5–5.0)
Alkaline Phosphatase: 70 U/L (ref 38–126)
Anion gap: 11 (ref 5–15)
BUN: 9 mg/dL (ref 8–23)
CO2: 23 mmol/L (ref 22–32)
Calcium: 9 mg/dL (ref 8.9–10.3)
Chloride: 103 mmol/L (ref 98–111)
Creatinine, Ser: 0.87 mg/dL (ref 0.44–1.00)
GFR, Estimated: >60 mL/min (ref >60)
Glucose, Bld: 284 mg/dL — ABNORMAL HIGH (ref 70–99)
Potassium: 4.2 mmol/L (ref 3.5–5.1)
Sodium: 137 mmol/L (ref 135–145)
Total Bilirubin: 1.2 mg/dL (ref 0.0–1.2)
Total Protein: 7.5 g/dL (ref 6.5–8.1)

## 2024-07-25 LAB — URINALYSIS, ROUTINE W REFLEX MICROSCOPIC
Bilirubin Urine: NEGATIVE
Glucose, UA: >=500 mg/dL — AB
Ketones, ur: 20 mg/dL — AB
Leukocytes,Ua: NEGATIVE
Nitrite: NEGATIVE
Protein, ur: >=300 mg/dL — AB
Specific Gravity, Urine: 1.031 — ABNORMAL HIGH (ref 1.005–1.030)
pH: 7 (ref 5.0–8.0)

## 2024-07-25 LAB — TYPE AND SCREEN
ABO/RH(D): O POS
Antibody Screen: NEGATIVE

## 2024-07-25 LAB — ABO/RH: ABO/RH(D): O POS

## 2024-07-25 LAB — I-STAT ARTERIAL BLOOD GAS, ED
Acid-base deficit: 1 mmol/L (ref 0.0–2.0)
Bicarbonate: 23 mmol/L (ref 20.0–28.0)
Calcium, Ion: 1.21 mmol/L (ref 1.15–1.40)
HCT: 33 % — ABNORMAL LOW (ref 36.0–46.0)
Hemoglobin: 11.2 g/dL — ABNORMAL LOW (ref 12.0–15.0)
O2 Saturation: 99 %
Potassium: 3.5 mmol/L (ref 3.5–5.1)
Sodium: 133 mmol/L — ABNORMAL LOW (ref 135–145)
TCO2: 24 mmol/L (ref 22–32)
pCO2 arterial: 34.7 mmHg (ref 32–48)
pH, Arterial: 7.43 (ref 7.35–7.45)
pO2, Arterial: 154 mmHg — ABNORMAL HIGH (ref 83–108)

## 2024-07-25 LAB — MRSA NEXT GEN BY PCR, NASAL: MRSA by PCR Next Gen: NOT DETECTED

## 2024-07-25 LAB — I-STAT VENOUS BLOOD GAS, ED
Acid-base deficit: 1 mmol/L (ref 0.0–2.0)
Bicarbonate: 23.6 mmol/L (ref 20.0–28.0)
Calcium, Ion: 1.1 mmol/L — ABNORMAL LOW (ref 1.15–1.40)
HCT: 41 % (ref 36.0–46.0)
Hemoglobin: 13.9 g/dL (ref 12.0–15.0)
O2 Saturation: 64 %
Potassium: 4.3 mmol/L (ref 3.5–5.1)
Sodium: 135 mmol/L (ref 135–145)
TCO2: 25 mmol/L (ref 22–32)
pCO2, Ven: 37.9 mmHg — ABNORMAL LOW (ref 44–60)
pH, Ven: 7.401 (ref 7.25–7.43)
pO2, Ven: 33 mmHg (ref 32–45)

## 2024-07-25 LAB — CK TOTAL AND CKMB (NOT AT ARMC)
CK, MB: 31.8 ng/mL — ABNORMAL HIGH (ref 0.5–5.0)
Total CK: 4216 U/L — ABNORMAL HIGH (ref 38–234)

## 2024-07-25 LAB — CG4 I-STAT (LACTIC ACID): Lactic Acid, Venous: 2.8 mmol/L (ref 0.5–1.9)

## 2024-07-25 LAB — I-STAT CG4 LACTIC ACID, ED: Lactic Acid, Venous: 4.7 mmol/L (ref 0.5–1.9)

## 2024-07-25 LAB — PROTIME-INR
INR: 1.1 (ref 0.8–1.2)
Prothrombin Time: 14.5 s (ref 11.4–15.2)

## 2024-07-25 LAB — ETHANOL: Alcohol, Ethyl (B): <15 mg/dL (ref <15)

## 2024-07-25 LAB — AMMONIA: Ammonia: 31 umol/L (ref 9–35)

## 2024-07-25 MED ORDER — VANCOMYCIN HCL IN DEXTROSE 1-5 GM/200ML-% IV SOLN: 1000.0000 mg | Freq: Once | INTRAVENOUS | Status: DC

## 2024-07-25 MED ORDER — LACTATED RINGERS IV BOLUS (SEPSIS)
1000.0000 mL | Freq: Once | INTRAVENOUS | Status: AC
  Administered 2024-07-25: 15:00:00 1000 mL via INTRAVENOUS

## 2024-07-25 MED ORDER — INSULIN ASPART 100 UNIT/ML IJ SOLN
0.0000 [IU] | INTRAMUSCULAR | Status: DC
  Administered 2024-07-25 (×2): 3 [IU] via SUBCUTANEOUS
  Administered 2024-07-26: 04:00:00 5 [IU] via SUBCUTANEOUS
  Administered 2024-07-26 (×2): 3 [IU] via SUBCUTANEOUS
  Filled 2024-07-25: qty 3
  Filled 2024-07-25: qty 5
  Filled 2024-07-25 (×3): qty 3

## 2024-07-25 MED ORDER — STROKE: EARLY STAGES OF RECOVERY BOOK
Freq: Once | Status: AC
  Filled 2024-07-25 (×2): qty 1

## 2024-07-25 MED ORDER — IOHEXOL 350 MG/ML SOLN
75.0000 mL | Freq: Once | INTRAVENOUS | Status: AC | PRN
  Administered 2024-07-25: 14:00:00 75 mL via INTRAVENOUS

## 2024-07-25 MED ORDER — LACTATED RINGERS IV BOLUS
1000.0000 mL | Freq: Once | INTRAVENOUS | Status: AC
  Administered 2024-07-25: 14:00:00 1000 mL via INTRAVENOUS

## 2024-07-25 MED ORDER — CHLORHEXIDINE GLUCONATE CLOTH 2 % EX PADS
6.0000 | MEDICATED_PAD | Freq: Every day | CUTANEOUS | Status: DC
  Administered 2024-07-25: 18:00:00 6 via TOPICAL

## 2024-07-25 MED ORDER — ORAL CARE MOUTH RINSE
15.0000 mL | OROMUCOSAL | Status: DC
  Administered 2024-07-26 – 2024-07-29 (×42): 15 mL via OROMUCOSAL

## 2024-07-25 MED ORDER — LABETALOL HCL 5 MG/ML IV SOLN: 10.0000 mg | INTRAVENOUS | Status: DC | PRN

## 2024-07-25 MED ORDER — FENTANYL 2500MCG IN NS 250ML (10MCG/ML) PREMIX INFUSION
0.0000 ug/h | INTRAVENOUS | Status: DC
  Administered 2024-07-25 (×2): 100 ug/h via INTRAVENOUS
  Administered 2024-07-27: 03:00:00 25 ug/h via INTRAVENOUS
  Administered 2024-07-28: 03:00:00 100 ug/h via INTRAVENOUS
  Administered 2024-07-28: 22:00:00 25 ug/h via INTRAVENOUS
  Filled 2024-07-25 (×3): qty 250

## 2024-07-25 MED ORDER — IOHEXOL 350 MG/ML SOLN
100.0000 mL | Freq: Once | INTRAVENOUS | Status: AC | PRN
  Administered 2024-07-25: 16:00:00 100 mL via INTRAVENOUS

## 2024-07-25 MED ORDER — POLYETHYLENE GLYCOL 3350 17 G PO PACK: 17.0000 g | PACK | Freq: Every day | ORAL | Status: DC | PRN

## 2024-07-25 MED ORDER — DOCUSATE SODIUM 100 MG PO CAPS: 100.0000 mg | ORAL_CAPSULE | Freq: Two times a day (BID) | ORAL | Status: DC | PRN

## 2024-07-25 MED ORDER — SODIUM CHLORIDE 0.9 % IV SOLN
2.0000 g | Freq: Once | INTRAVENOUS | Status: AC
  Administered 2024-07-25: 14:00:00 2 g via INTRAVENOUS
  Filled 2024-07-25: qty 12.5

## 2024-07-25 MED ORDER — FAMOTIDINE 20 MG PO TABS
20.0000 mg | ORAL_TABLET | Freq: Two times a day (BID) | ORAL | Status: DC
  Administered 2024-07-25: 23:00:00 20 mg
  Filled 2024-07-25: qty 1

## 2024-07-25 MED ORDER — ORAL CARE MOUTH RINSE: 15.0000 mL | OROMUCOSAL | Status: DC | PRN

## 2024-07-25 MED ORDER — LACTATED RINGERS IV SOLN: INTRAVENOUS | Status: DC

## 2024-07-25 MED ORDER — VANCOMYCIN HCL 1250 MG/250ML IV SOLN
1250.0000 mg | Freq: Once | INTRAVENOUS | Status: AC
  Administered 2024-07-25: 16:00:00 1250 mg via INTRAVENOUS
  Filled 2024-07-25: qty 250

## 2024-07-25 MED ORDER — PIPERACILLIN-TAZOBACTAM 3.375 G IVPB
3.3750 g | Freq: Three times a day (TID) | INTRAVENOUS | Status: DC
  Administered 2024-07-25 – 2024-07-26 (×2): 3.375 g via INTRAVENOUS
  Filled 2024-07-25 (×2): qty 50

## 2024-07-25 MED ORDER — NOREPINEPHRINE 4 MG/250ML-% IV SOLN
0.0000 ug/min | INTRAVENOUS | Status: DC
  Administered 2024-07-25: 21:00:00 1 ug/min via INTRAVENOUS

## 2024-07-25 MED ORDER — DOCUSATE SODIUM 50 MG/5ML PO LIQD: 100.0000 mg | Freq: Two times a day (BID) | ORAL | Status: DC | PRN

## 2024-07-25 MED ORDER — SODIUM CHLORIDE 0.9 % IV SOLN
250.0000 mL | INTRAVENOUS | Status: AC
  Administered 2024-07-26: 03:00:00 250 mL via INTRAVENOUS

## 2024-07-25 MED ORDER — NOREPINEPHRINE 4 MG/250ML-% IV SOLN
INTRAVENOUS | Status: AC
  Filled 2024-07-25: qty 250

## 2024-07-25 MED ORDER — LACTATED RINGERS IV BOLUS
1000.0000 mL | Freq: Once | INTRAVENOUS | Status: AC
  Administered 2024-07-25: 18:00:00 1000 mL via INTRAVENOUS

## 2024-07-25 MED ORDER — LABETALOL HCL 5 MG/ML IV SOLN
10.0000 mg | INTRAVENOUS | Status: DC | PRN
  Administered 2024-07-30: 10 mg via INTRAVENOUS
  Filled 2024-07-25: qty 4

## 2024-07-25 MED ORDER — ETOMIDATE 2 MG/ML IV SOLN
INTRAVENOUS | Status: AC | PRN
  Administered 2024-07-25: 16:00:00 20 mg via INTRAVENOUS

## 2024-07-25 MED ORDER — ROSUVASTATIN CALCIUM 20 MG PO TABS
40.0000 mg | ORAL_TABLET | Freq: Every day | ORAL | Status: DC
  Administered 2024-07-25 – 2024-08-02 (×9): 40 mg
  Filled 2024-07-25 (×9): qty 2

## 2024-07-25 MED ORDER — ROCURONIUM BROMIDE 10 MG/ML (PF) SYRINGE
PREFILLED_SYRINGE | INTRAVENOUS | Status: AC | PRN
  Administered 2024-07-25: 16:00:00 80 mg via INTRAVENOUS

## 2024-07-25 MED ORDER — FENTANYL CITRATE (PF) 50 MCG/ML IJ SOSY
25.0000 ug | PREFILLED_SYRINGE | Freq: Once | INTRAMUSCULAR | Status: AC
  Administered 2024-07-25: 18:00:00 50 ug via INTRAVENOUS

## 2024-07-25 MED ORDER — FENTANYL BOLUS VIA INFUSION
25.0000 ug | INTRAVENOUS | Status: DC | PRN
  Administered 2024-07-25: 21:00:00 50 ug via INTRAVENOUS
  Administered 2024-07-26 (×2): 25 ug via INTRAVENOUS
  Administered 2024-07-27: 19:00:00 100 ug via INTRAVENOUS
  Administered 2024-07-27: 03:00:00 25 ug via INTRAVENOUS
  Administered 2024-07-27 (×7): 100 ug via INTRAVENOUS
  Administered 2024-07-27: 08:00:00 50 ug via INTRAVENOUS
  Administered 2024-07-27 – 2024-07-28 (×11): 100 ug via INTRAVENOUS

## 2024-07-25 MED ORDER — LACTATED RINGERS IV BOLUS (SEPSIS)
1000.0000 mL | Freq: Once | INTRAVENOUS | Status: AC
  Administered 2024-07-25: 16:00:00 1000 mL via INTRAVENOUS

## 2024-07-25 MED ORDER — PROPOFOL 1000 MG/100ML IV EMUL
0.0000 ug/kg/min | INTRAVENOUS | Status: DC
  Administered 2024-07-25: 16:00:00 20 ug/kg/min via INTRAVENOUS
  Administered 2024-07-25: 23:00:00 25 ug/kg/min via INTRAVENOUS
  Filled 2024-07-25: qty 100

## 2024-07-25 MED ORDER — METRONIDAZOLE 500 MG/100ML IV SOLN
500.0000 mg | Freq: Once | INTRAVENOUS | Status: AC
  Administered 2024-07-25: 15:00:00 500 mg via INTRAVENOUS
  Filled 2024-07-25: qty 100

## 2024-07-25 NOTE — ED Notes (Signed)
 EDP at [facility]

## 2024-07-25 NOTE — Consult Note (Signed)
 NEUROLOGY CONSULT NOTE   Date of service: July 25, 2024 Patient Name: Carolyn Hunter MRN:  [id number] DOB:  1951-03-27 Chief Complaint: Right-sided weakness Requesting Provider: Claudene Toribio BROCKS, MD  History of Present Illness  Carolyn Hunter is a 73 y.o. female with hx of diabetes, hypertension, hyperlipidemia who presents with right-sided weakness.  She was last in her normal state of health sometime yesterday, and then sometime either yesterday or overnight she fell and her husband was unable to get her up.  It is unclear how long she was down on the ground for, but it was long enough for her CK to rise.  LKW: yesterday sometime Modified rankin score: 0-Completely asymptomatic and back to baseline post- stroke IV Thrombolysis: no, out of window EVT: no, no LVO  NIHSS components Score: Comment  1a Level of Conscious 0[]  1[]  2[x]  3[]      1b LOC Questions 0[]  1[]  2[x]       1c LOC Commands 0[]  1[]  2[x]       2 Best Gaze 0[]  1[x]  2[]       3 Visual 0[]  1[]  2[]  3[x]      4 Facial Palsy 0[]  1[x]  2[]  3[]      5a Motor Arm - left 0[x]  1[]  2[]  3[]  4[]  UN[]    5b Motor Arm - Right 0[]  1[]  2[]  3[]  4[x]  UN[]    6a Motor Leg - Left 0[]  1[]  2[x]  3[]  4[]  UN[]    6b Motor Leg - Right 0[]  1[]  2[]  3[x]  4[]  UN[]    7 Limb Ataxia 0[x]  1[]  2[]  UN[]      8 Sensory 0[x]  1[]  2[]  UN[]      9 Best Language 0[]  1[]  2[]  3[x]      10 Dysarthria 0[]  1[]  2[x]  UN[]      11 Extinct. and Inattention 0[x]  1[]  2[]       TOTAL: 25       Past History   Past Medical History:  Diagnosis Date   Anxiety    Cataract    OS   Diabetes mellitus without complication (HCC)    Diabetic retinopathy (HCC)    NPDR OU   Hyperlipidemia    Hypertension    Hypertensive retinopathy    OU   Tremors of nervous system    just when I get in a nervous Situation.    Past Surgical History:  Procedure Laterality Date   CATARACT EXTRACTION W/PHACO Right 10/18/2018   Procedure: CATARACT EXTRACTION PHACO AND INTRAOCULAR LENS  PLACEMENT (IOC);  Surgeon: Harrie Agent, MD;  Location: [facility];  Service: Ophthalmology;  Laterality: Right;  CDE: 8.11   CATARACT EXTRACTION W/PHACO Left 01/17/2019   Procedure: CATARACT EXTRACTION PHACO AND INTRAOCULAR LENS PLACEMENT (IOC);  Surgeon: Harrie Agent, MD;  Location: [facility];  Service: Ophthalmology;  Laterality: Left;  CDE: 6.38   CESAREAN SECTION     1988   COLONOSCOPY WITH PROPOFOL  N/A 05/04/2020   Procedure: COLONOSCOPY WITH PROPOFOL ;  Surgeon: Eartha Angelia Toribio, MD;  [facility];  Service: Gastroenterology;  Laterality: N/A;  830   EYE SURGERY Right    Cataract extraction OD   POLYPECTOMY  05/04/2020   Procedure: POLYPECTOMY;  Surgeon: Eartha Angelia Toribio, MD;  [facility]: [facility];  Service: Gastroenterology;;    Family History: Family History  Problem Relation Age of Onset   Cancer Mother        breast and colon cancer   Breast cancer Mother    Diabetes Brother    Diabetes Son    Asthma Father  COPD   Diabetes Son     Social History  reports that she has never smoked. She has never used smokeless tobacco. She reports that she does not drink alcohol  and does not use drugs.  Allergies[1]  Medications  Current Medications[2]  Vitals   Vitals:   08-01-24 1608 2024-08-01 1614 2024-08-01 1619 August 01, 2024 1645  BP: (!) 148/87 (!) 164/103  (!) 152/79  Pulse: (!) 110 (!) 135  (!) 117  Resp: 19 19  18   Temp:    100 F (37.8 C)  TempSrc:      SpO2: 100% 100% 100% 100%  Weight:      Height:   5' 3 (1.6 m)     Body mass index is 25.33 kg/m.   Physical Exam   Constitutional: Appears well-developed and well-nourished.  Neurologic Examination    Neuro: Mental Status: Patient is lethargic, but with stimul,ation does arouse and push me away with left arm, does not speak or follow commands.  Cranial Nerves: II: She does not blink to threat from either side. Pupils are reactive bilaterally III,IV, VI: She appears to have a  left gaze preference though I do see her cross midline to the right at least once V: Facial sensation is symmetric to temperature VII: Facial movement is symmetric.  VIII: hearing is intact to voice X: Uvula elevates symmetrically XII: tongue is midline without atrophy or fasciculations.  Motor: Tone is decreased on the right, no movement in the right arm, she does wiggle her toes in the right leg but does not try and move it against gravity, she was her left arm and leg well, but does not cooperate with formal testing Sensory: She does respond to noxious stimulation bilaterally Cerebellar: Does not perform       Labs/Imaging/Neurodiagnostic studies   CBC:  Recent Labs  Lab 08-01-24 1324 Aug 01, 2024 1336 August 01, 2024 1712  WBC 13.5*  --   --   NEUTROABS 11.2*  --   --   HGB 13.7 13.9  14.3 11.2*  HCT 39.9 41.0  42.0 33.0*  MCV 94.1  --   --   PLT 239  --   --    Basic Metabolic Panel:  Lab Results  Component Value Date   NA 133 (L) 08-01-24   K 3.5 2024/08/01   CO2 23 2024-08-01   GLUCOSE 284 (H) 08-01-24   BUN 10 08-01-24   CREATININE 0.80 August 01, 2024   CALCIUM  9.0 08/01/2024   GFRNONAA >60 2024/08/01   GFRAA 92 01/10/2020   Lipid Panel:  Lab Results  Component Value Date   LDLCALC 129 (H) 07/30/2021   HgbA1c:  Lab Results  Component Value Date   HGBA1C 6.7 (H) 07/30/2021   Urine Drug Screen: No results found for: LABOPIA, COCAINSCRNUR, LABBENZ, AMPHETMU, THCU, LABBARB  Alcohol  Level     Component Value Date/Time   [facility] <15 Aug 01, 2024 1324   INR  Lab Results  Component Value Date   INR 1.1 08-01-2024    CT Head without contrast(Personally reviewed): Negative  CT angio Head and Neck with contrast(Personally reviewed): Negative  CT perfusion is motion limited  ASSESSMENT   Carolyn Hunter is a 73 y.o. female found down with right-sided weakness.  Her exam is quite concerning for an acute ischemic stroke, however I would expect  this to be present on CT given the duration of time.  I will get an EEG as well, but suspect that we ultimately will find infarct on MRI.  There is  a chance that her encephalopathy is mainly due to being down, and that the weakness is something separate and so I will get an MRI of her cervical spine as well, though my suspicion for this is fairly low.  Finally if she was on her right side for a prolonged period it is possible that this is weakness due to simply being on her right side as opposed to a primarily neurological culprit.  RECOMMENDATIONS  MRI brain, will also get C-spine EEG Supportive care per CCM Further recommendations following the above.  ______________________________________________________________________    Signed, Aisha Seals, MD [facility]    [1]  Allergies Allergen Reactions   Ace Inhibitors Cough   Zetia  [Ezetimibe ] Swelling    Sore throat  [2]  Current Facility-Administered Medications:    Chlorhexidine  Gluconate Cloth 2 % PADS 6 each, 6 each, Topical, Daily, Claudene Toribio BROCKS, MD, 6 each at 07/25/24 1741   docusate (COLACE) 50 MG/5ML liquid 100 mg, 100 mg, Per Tube, BID PRN, Claudene Toribio BROCKS, MD   famotidine  (PEPCID ) tablet 20 mg, 20 mg, Per Tube, BID, Claudene Toribio BROCKS, MD   fentaNYL  (SUBLIMAZE ) bolus via infusion 25-100 mcg, 25-100 mcg, Intravenous, Q15 min PRN, Young, Travis J, DO   fentaNYL  in NS (17mcg/ml) infusion-PREMIX, 0-400 mcg/hr, Intravenous, Continuous, Young, Travis J, DO, Last Rate: 10 mL/hr at 07/25/24 1621, 100 mcg/hr at 07/25/24 1621   insulin  aspart (novoLOG ) injection 0-9 Units, 0-9 Units, Subcutaneous, Q4H, Payne, John D, PA-C   labetalol  (NORMODYNE ) injection 10 mg, 10 mg, Intravenous, Q2H PRN, Claudene Toribio BROCKS, MD   lactated ringers  infusion, , Intravenous, Continuous, Young, Travis J, DO   piperacillin -tazobactam (ZOSYN ) IVPB 3.375 g, 3.375 g, Intravenous, Q8H, Gaines Carrier, RPH   polyethylene glycol  (MIRALAX  / GLYCOLAX ) packet 17 g, 17 g, Per Tube, Daily PRN, Claudene Toribio BROCKS, MD   propofol  (DIPRIVAN ) 1000 MG/100ML infusion, 0-80 mcg/kg/min, Intravenous, Continuous, Young, Travis J, DO, Last Rate: 7.79 mL/hr at 07/25/24 1616, 20 mcg/kg/min at 07/25/24 1616   rosuvastatin  (CRESTOR ) tablet 40 mg, 40 mg, Per Tube, Daily, Payne, John D, PA-C

## 2024-07-25 NOTE — Code Documentation (Addendum)
 Carolyn Hunter is a 73 yr old female arriving to [facility] on 07/25/2024 with an unknown PMH after having been found down. She was last known well sometime yesterday per her family. Today she was found down, not moving or talking. No known use of anticoagulants.    SRN called to bedside by MD to expedite advanced imaging. NIHSS 26. Pt stuporous, mute, and plegic on right. Please see documentation for NIHSS details and timeline.     Pt returned to CT with team. The following imaging was obtained: CT, CTA and P. Per Dr. Michaela, There was no hemorrhage on CTNC, and no LVO on advanced imaging.  Pt intubated by EDP upon return to ED room 17.  Care plan:   No acute treatment/TIA alert: q2h x 12 hours NIHSS & VS, then q4h  NPO until stroke swallow screen done.   Bedside handoff with Jenna ED RN complete.

## 2024-07-25 NOTE — ED Provider Notes (Signed)
 [place] EMERGENCY DEPARTMENT AT [facility] Provider Note   CSN: [id number] Arrival date & time: 07/25/24  1320     Patient presents with: Carolyn Hunter is a 73 y.o. female.   This is a 73 year old female presenting emergency department after being found down on the ground.  Fell yesterday, found sometime today.  Unclear when last known well was.  Sometime yesterday afternoon.  Apparently alert and oriented x 3 fully independent with ADLs.  Husband with dementia.  Was 90% on room air with EMS, placed on nonrebreather.  Patient GCS of 8 on arrival.  Has bruising to her right periorbital area on her eye and swelling to her right hand and forearm.  No other obvious injuries or overt infections.   Fall       Prior to Admission medications  Medication Sig Start Date End Date Taking? Authorizing Provider  Aflibercept  (EYLEA  IO) Inject 1 Dose into the eye every 30 (thirty) days. Injectable med given @ retina office q 12 wks.    [provider]  amLODipine  (NORVASC ) 5 MG tablet TAKE 1 TABLET BY MOUTH EVERY DAY 04/17/21   Elnor Fairy HERO, NP  Cholecalciferol (VITAMIN D3) 50 MCG (2000 UT) TABS Take 2,000 Units by mouth daily.    [provider]  Liniments (SALONPAS PAIN RELIEF PATCH EX) Place 1 patch onto the skin daily as needed (pain.).    [provider]  metFORMIN  (GLUCOPHAGE ) 1000 MG tablet TAKE 1 TABLET BY MOUTH EVERY DAY WITH BREAKFAST 07/06/23   Antonetta Rollene BRAVO, MD  propranolol  ER (INDERAL  LA) 60 MG 24 hr capsule TAKE 1 CAPSULE BY MOUTH EVERY DAY 05/17/21   Elnor Fairy HERO, NP  rosuvastatin  (CRESTOR ) 40 MG tablet Take 1 tablet (40 mg total) by mouth daily. 08/29/21   Paseda, Folashade R, FNP    Allergies: Ace inhibitors and Zetia  [ezetimibe ]    Review of Systems  Updated Vital Signs BP (!) 164/103   Pulse (!) 135   Temp 100.1 F (37.8 C) (Rectal)   Resp 19   Wt 64.9 kg   SpO2 100%   BMI 25.33 kg/m   Physical Exam Vitals  and nursing note reviewed.  Constitutional:      General: She is in acute distress.     Appearance: She is ill-appearing and toxic-appearing.  HENT:     Head: Normocephalic.     Comments: Significant bruising and ecchymosis to right cheek and around eye.    Nose: Nose normal.     Mouth/Throat:     Mouth: Mucous membranes are dry.  Eyes:     Conjunctiva/sclera: Conjunctivae normal.     Pupils: Pupils are equal, round, and reactive to light.  Neck:     Comments: In c-collar Cardiovascular:     Rate and Rhythm: Regular rhythm. Tachycardia present.  Pulmonary:     Comments: Tachypneic.  Clear lung sounds.  Maintaining oxygen saturation on nasal cannula Abdominal:     General: Abdomen is flat. There is no distension.     Palpations: Abdomen is soft.     Tenderness: There is no abdominal tenderness. There is no guarding or rebound.  Musculoskeletal:     Right lower leg: No edema.     Left lower leg: No edema.     Comments: Upper extremity with swelling from the hand to the mid forearm.  Skin:    General: Skin is warm and dry.     Findings: No rash.  Neurological:     Comments: GCS of 8.  Pupils equal round reactive to light.  Patient does have movement with her bilateral lower extremities and her left upper extremity, flicker of movement with right upper extremity.  Open eyes intermittently, but not to voice or stimuli.      (all labs ordered are listed, but only abnormal results are displayed) Labs Reviewed  COMPREHENSIVE METABOLIC PANEL WITH GFR - Abnormal; Notable for the following components:      Result Value   Glucose, Bld 284 (*)    AST 48 (*)    All other components within normal limits  URINALYSIS, ROUTINE W REFLEX MICROSCOPIC - Abnormal; Notable for the following components:   APPearance CLOUDY (*)    Specific Gravity, Urine 1.031 (*)    Glucose, UA >=500 (*)    Hgb urine dipstick LARGE (*)    Ketones, ur 20 (*)    Protein, ur >=300 (*)    Bacteria, UA MANY (*)     All other components within normal limits  CK TOTAL AND CKMB (NOT AT [facility]) - Abnormal; Notable for the following components:   Total CK 4,216 (*)    CK, MB 31.8 (*)    All other components within normal limits  CBC WITH DIFFERENTIAL/PLATELET - Abnormal; Notable for the following components:   WBC 13.5 (*)    Neutro Abs 11.2 (*)    Monocytes Absolute 1.5 (*)    Abs Immature Granulocytes 0.12 (*)    All other components within normal limits  I-STAT CHEM 8, ED - Abnormal; Notable for the following components:   Glucose, Bld 284 (*)    Calcium , Ion 1.10 (*)    All other components within normal limits  I-STAT CG4 LACTIC ACID, ED - Abnormal; Notable for the following components:   Lactic Acid, Venous 4.7 (*)    All other components within normal limits  I-STAT VENOUS BLOOD GAS, ED - Abnormal; Notable for the following components:   pCO2, Ven 37.9 (*)    Calcium , Ion 1.10 (*)    All other components within normal limits  CULTURE, BLOOD (SINGLE)  ETHANOL  PROTIME-INR  BETA-HYDROXYBUTYRIC ACID  I-STAT CG4 LACTIC ACID, ED  I-STAT CG4 LACTIC ACID, ED  TYPE AND SCREEN  ABO/RH    EKG: None  Radiology: CT ANGIO HEAD NECK W WO CM W PERF (CODE STROKE) LKW > 6h Result Date: 07/25/2024 EXAM: CTA Head and Neck with Perfusion 07/25/2024 03:46:29 PM TECHNIQUE: CTA of the head and neck was performed with the administration of intravenous contrast. 3D postprocessing with multiplanar reconstructions and MIPs was performed to evaluate the vascular anatomy. Cerebral perfusion analysis using computed tomography with contrast administration, including post-processing of parametric maps with determination of cerebral blood flow, cerebral blood volume, mean transit time and time-to-maximum. Automated exposure control, iterative reconstruction, and/or weight based adjustment of the mA/kV was utilized to reduce the radiation dose to as low as reasonably achievable. CONTRAST: Without and with IV contrast.  100 mL (iohexol  (OMNIPAQUE ) 350 MG/ML injection 100 mL IOHEXOL  350 MG/ML SOLN). COMPARISON: Same day CT head. CLINICAL HISTORY: Neuro deficit, acute, stroke suspected. FINDINGS: CTA NECK: AORTIC ARCH AND ARCH VESSELS: Mild atherosclerosis along the proximal left subclavian artery without significant stenosis. No dissection or arterial injury. No significant stenosis of the brachiocephalic or subclavian arteries. CERVICAL CAROTID ARTERIES: The right carotid artery is patent from the origin to the skull base. There is mild atherosclerosis along the distal right common carotid artery and at  the right carotid bifurcation without hemodynamically significant stenosis. The left carotid artery is patent from the origin to the skull base. Mild atherosclerosis of the distal left common carotid artery and left carotid bifurcation without hemodynamically significant stenosis. No dissection or arterial injury. CERVICAL VERTEBRAL ARTERIES: The right vertebral artery is dominant. The vertebral arteries are patent from the origins to the vertebrobasilar confluence. There is long segment severe stenosis of the left V1 segment without evidence of occlusion. Additional atherosclerosis of the bilateral V4 segments without significant stenosis. No dissection or arterial injury. LUNGS AND MEDIASTINUM: Unremarkable. SOFT TISSUES: Subcentimeter nodules in the right thyroid  lobe. BONES: No acute abnormality. CTA HEAD: ANTERIOR CIRCULATION: The intracranial internal carotid arteries are patent bilaterally. Mild atherosclerosis of the carotid siphons without significant stenosis. The anterior cerebral arteries are patent bilaterally. Right MCA is patent. There is mild stenosis of the distal right M1 segment. The left MCA is patent. There is mild stenosis of the liver and kidney division branches of the left MCA without evidence of focal stenosis or evidence of occlusion. Additional atherosclerotic irregularity of distal left MCA branches in  the posterior aspect of the left MCA territory. No aneurysm. POSTERIOR CIRCULATION: There is moderate stenosis of the posterior P2 segment of the left PCA. No significant stenosis of the basilar artery. No significant stenosis of the vertebral arteries. No aneurysm. OTHER: No dural venous sinus thrombosis on this non-dedicated study. CT PERFUSION: EXAM QUALITY: The perfusion portion of the examination is significantly limited due to motion artifact. CORE INFARCT (CBF<30% volume): There is no region of cerebral blood flow less than 30 percent. 0 mL TOTAL HYPOPERFUSION (Tmax>6s volume): There are scattered areas of elevated Tmax greater than 6 seconds with a volume of 12 mL. PENUMBRA: Mismatch volume of 12 mL. The majority of the regions of elevated Tmax are favored to be artifactual given the degree of motion artifact. However, some of the more prolonged regions of elevated Tmax appear to localize within the anterior aspect of the left MCA territory, correlating with region of clinical concern. Recommend MRI brain for further evaluation. Mismatch ratio: not applicable Location: Anterior aspect of the left MCA territory. IMPRESSION: 1. No acute large vessel occlusion. 2. CT perfusion is significantly limited by motion artifact. Some regions of elevated Tmax appear to localize to the anterior left MCA territory, which could reflect true perfusion delay. Recommend MRI brain for further evaluation. 3. 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 4. Moderate stenosis of the posterior P2 segment of the left PCA. 5. Diminutive caliber of left M2 superior division branches without focal stenosis or occlusion. Atherosclerotic irregularity of distal left MCA branches in the posterior aspect of the left MCA territory. 6. Finding of no LVO and perfusion results were discussed with Dr. Michaela at 3:47 PM on 07/25/24. Electronically signed by: Donnice Mania MD 07/25/2024 04:17 PM EST RP  Workstation: [id number]   CT HEAD CODE STROKE WO CONTRAST Result Date: 07/25/2024 EXAM: CT HEAD WITHOUT CONTRAST 07/25/2024 03:42:53 PM TECHNIQUE: CT of the head was performed without the administration of intravenous contrast. Automated exposure control, iterative reconstruction, and/or weight based adjustment of the mA/kV was utilized to reduce the radiation dose to as low as reasonably achievable. COMPARISON: 07/25/2024 CLINICAL HISTORY: Neuro deficit, acute, stroke suspected. FINDINGS: BRAIN AND VENTRICLES: Diffuse age-appropriate atrophy throughout brain parenchyma. Mild periventricular white matter changes and mild ex vacuo ventricular dilatation. No acute hemorrhage. No evidence of acute infarct. No hydrocephalus. No extra-axial collection.  No mass effect or midline shift. ORBITS: Right periorbital hematoma similar to prior exam. SINUSES: No acute abnormality. SOFT TISSUES AND SKULL: Right periorbital hematoma similar to prior exam. No skull fracture. [place] Stroke Program Early CT (ASPECT) Score: Ganglionic (caudate, internal capsule, lentiform nucleus, insula, M1-M3): 7 Supraganglionic (M4-M6): 3 Total: 10 IMPRESSION: 1. No acute intracranial abnormality. 2. ASPECTS 10. 3. Right periorbital hematoma, unchanged from prior exam. 4. Findings called to Dr. Michaela at 3:47 PM on 07/25/24. Electronically signed by: Donnice Mania MD 07/25/2024 04:00 PM EST RP Workstation: [id number]   DG Pelvis Portable Result Date: 07/25/2024 EXAM: 1 or 2 VIEW(S) XRAY OF THE PELVIS 07/25/2024 02:28:02 PM COMPARISON: CT chest, abdomen, and pelvis 07/25/2024. CLINICAL HISTORY: Unresponsive after unwitnessed fall. FINDINGS: BONES AND JOINTS: No acute fracture. Degenerative changes in the spine and hips. SI joints and symphysis pubis are not displaced. SOFT TISSUES: Residual contrast material in the bladder and renal collecting systems. IMPRESSION: 1. No acute fracture or pelvic ring disruption identified. Electronically  signed by: Elsie Gravely MD 07/25/2024 02:46 PM EST RP Workstation: [id number]   DG Forearm Right Result Date: 07/25/2024 EXAM: 1 VIEW(S) XRAY OF THE RIGHT FOREARM 07/25/2024 02:28:02 PM COMPARISON: None available. CLINICAL HISTORY: trauma trauma FINDINGS: BONES AND JOINTS: No evidence of acute fracture or dislocation of the right forearm. Degenerative changes in the right wrist. SOFT TISSUES: Mild soft tissue swelling over the dorsum of the forearm. IMPRESSION: 1. Mild soft tissue swelling over the dorsum of the forearm. 2. No acute fracture or dislocation of the right forearm. Electronically signed by: Elsie Gravely MD 07/25/2024 02:44 PM EST RP Workstation: [id number]   DG Chest Port 1 View Result Date: 07/25/2024 EXAM: 1 VIEW(S) XRAY OF THE CHEST 07/25/2024 02:28:02 PM COMPARISON: CT chest abdomen and pelvis 07/25/2024. CLINICAL HISTORY: Trauma. FINDINGS: LUNGS AND PLEURA: Shallow inspiration. Infiltration or atelectasis in the right lung base. No pleural effusion. No pneumothorax. HEART AND MEDIASTINUM: Mediastinal contours appear intact. BONES AND SOFT TISSUES: Degenerative changes in the spine and shoulders. IMPRESSION: 1. Infiltration or atelectasis in the right lung base. 2. No pleural effusion or pneumothorax. Electronically signed by: Elsie Gravely MD 07/25/2024 02:43 PM EST RP Workstation: [id number]   CT Head Wo Contrast Result Date: 07/25/2024 EXAM: CT HEAD WITH INTRAVENOUS CONTRAST 07/25/2024 01:54:56 PM TECHNIQUE: CT of the head was performed with the administration of 75 mL of iohexol  (OMNIPAQUE ) 350 MG/ML injection. Automated exposure control, iterative reconstruction, and/or weight based adjustment of the mA/kV was utilized to reduce the radiation dose to as low as reasonably achievable. COMPARISON: None available. CLINICAL HISTORY: Mental status change, unknown cause; Polytrauma, blunt. FINDINGS: BRAIN AND VENTRICLES: No acute intracranial hemorrhage. No mass effect or midline  shift. No extra-axial fluid collection. No evidence of acute infarct. No hydrocephalus. Cavum septum pellucidum and vergae. Hypoattenuating foci in the cerebral white matter, most likely representing chronic small vessel disease. ORBITS: A right lateral periorbital hematoma is identified which has a thickness of 1 cm, image 16/3. SINUSES AND MASTOIDS: Mild mucosal thickening involving the ethmoid air cells, right maxillary sinus and sphenoid sinus. SOFT TISSUES AND SKULL: No acute skull fracture. A right lateral periorbital hematoma is identified which has a thickness of 1 cm, image 16/3. IMPRESSION: 1. No acute intracranial abnormality. 2. Right lateral periorbital hematoma measuring 1 cm in thickness. 3. Mild paranasal sinus mucosal thickening. Electronically signed by: Waddell Calk MD 07/25/2024 02:14 PM EST RP Workstation: [id number]   CT CHEST ABDOMEN PELVIS W CONTRAST Result Date: 07/25/2024 EXAM: CT  CHEST, ABDOMEN AND PELVIS WITH CONTRAST 07/25/2024 01:54:56 PM TECHNIQUE: CT of the chest, abdomen and pelvis was performed with the administration of 75 mL of iohexol  (OMNIPAQUE ) 350 MG/ML injection. Multiplanar reformatted images are provided for review. Automated exposure control, iterative reconstruction, and/or weight based adjustment of the mA/kV was utilized to reduce the radiation dose to as low as reasonably achievable. COMPARISON: None available. CLINICAL HISTORY: Sepsis. FINDINGS: CHEST: Mild scattered calcified plaque in the descending thoracic aorta. MEDIASTINUM AND LYMPH NODES: 3 vessel coronary calcifications. Heart and pericardium are otherwise unremarkable. The central airways are clear. No mediastinal, hilar or axillary lymphadenopathy. LUNGS AND PLEURA: Subsegmental pleural based atelectasis, consolidation or scarring laterally in the right middle lobe. Dependent atelectasis in both lung bases. No pleural effusion or pneumothorax. ABDOMEN AND PELVIS: LIVER: The liver is unremarkable.  GALLBLADDER AND BILE DUCTS: Multiple partially calcified gallstones measuring up to 1 cm in diameter in the gallbladder fundus. No biliary ductal dilatation. SPLEEN: No acute abnormality. PANCREAS: No acute abnormality. ADRENAL GLANDS: No acute abnormality. KIDNEYS, URETERS AND BLADDER: No stones in the kidneys or ureters. No hydronephrosis. No perinephric or periureteral stranding. Urinary bladder is unremarkable. GI AND BOWEL: Stomach demonstrates no acute abnormality. A few scattered diverticula near the descending sigmoid junction without adjacent inflammatory change. There is no bowel obstruction. REPRODUCTIVE ORGANS: No acute abnormality. PERITONEUM AND RETROPERITONEUM: No ascites. No free air. VASCULATURE: Moderate calcified plaque in the abdominal aorta without aneurysm. Aorta is normal in caliber. ABDOMINAL AND PELVIS LYMPH NODES: No lymphadenopathy. REPRODUCTIVE ORGANS: No acute abnormality. BONES AND SOFT TISSUES: Vertebral endplate spurring at multiple levels in the lower thoracic spine. Right shoulder DJD. No focal soft tissue abnormality. IMPRESSION: 1. No acute abnormality of the chest, abdomen, and pelvis related to sepsis. Electronically signed by: Katheleen Faes MD 07/25/2024 02:14 PM EST RP Workstation: [id number]   CT Cervical Spine Wo Contrast Result Date: 07/25/2024 EXAM: CT CERVICAL SPINE WITHOUT CONTRAST 07/25/2024 01:54:56 PM TECHNIQUE: CT of the cervical spine was performed without the administration of intravenous contrast. Multiplanar reformatted images are provided for review. Automated exposure control, iterative reconstruction, and/or weight based adjustment of the mA/kV was utilized to reduce the radiation dose to as low as reasonably achievable. COMPARISON: None available. CLINICAL HISTORY: Neck trauma (Age >= 65y). FINDINGS: BONES AND ALIGNMENT: No acute fracture or traumatic malalignment. Acquired fusion at C5-C6. 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 SOFT TISSUES: No prevertebral soft tissue swelling. Atherosclerotic calcifications of the carotid bulbs. IMPRESSION: 1. No acute fracture or traumatic malalignment of the cervical spine. 2. 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 Electronically signed by: Ryan Chess MD 07/25/2024 02:13 PM EST RP Workstation: [id number]     .Critical Care  Performed by: Neysa Caron PARAS, DO Authorized by: Neysa Caron PARAS, DO   Critical care provider statement:    Critical care time (minutes):  75   Critical care was necessary to treat or prevent imminent or life-threatening deterioration of the following conditions:  CNS failure or compromise, sepsis and trauma   Critical care was time spent personally by me on the following activities:  Development of treatment plan with patient or surrogate, discussions with consultants, evaluation of patient's response to treatment, examination of patient, ordering and review of laboratory studies, ordering and review of radiographic studies, ordering and performing treatments and interventions, pulse oximetry, re-evaluation  of patient's condition and review of old charts Procedure Name: Intubation Date/Time: 07/25/2024 4:23 PM  Performed by: Neysa Caron PARAS, DOPre-anesthesia Checklist: Patient identified Oxygen Delivery Method: Nasal cannula Preoxygenation: Pre-oxygenation with 100% oxygen Induction Type: Rapid sequence Ventilation: Mask ventilation without difficulty Laryngoscope Size: Glidescope Grade View: Grade II Tube size: 7.5 mm Number of attempts: 1 Airway Equipment and Method: Rigid stylet Placement Confirmation: ETT inserted through vocal cords under direct vision, Positive ETCO2, CO2 detector and Breath sounds checked- equal and bilateral Secured at: 23 cm Tube secured with: ETT  holder Dental Injury: Teeth and Oropharynx as per pre-operative assessment  Difficulty Due To: Difficulty was anticipated       Medications Ordered in the ED  lactated ringers  infusion (has no administration in time range)  lactated ringers  bolus 1,000 mL (0 mLs Intravenous Stopped 07/25/24 1511)    And  lactated ringers  bolus 1,000 mL (1,000 mLs Intravenous New Bag/Given 07/25/24 1620)  vancomycin  (VANCOREADY) IVPB 1250 mg/250 mL (1,250 mg Intravenous New Bag/Given 07/25/24 1550)  lactated ringers  bolus 1,000 mL (has no administration in time range)  etomidate  (AMIDATE ) injection (20 mg Intravenous Given 07/25/24 1611)  rocuronium  (ZEMURON ) injection (80 mg Intravenous Given 07/25/24 1612)  fentaNYL  (SUBLIMAZE ) injection 25-50 mcg (has no administration in time range)  fentaNYL  in NS (98mcg/ml) infusion-PREMIX (100 mcg/hr Intravenous Bolus 07/25/24 1621)  fentaNYL  (SUBLIMAZE ) bolus via infusion 25-100 mcg (has no administration in time range)  propofol  (DIPRIVAN ) 1000 MG/100ML infusion (20 mcg/kg/min  64.9 kg Intravenous New Bag/Given 07/25/24 1616)  lactated ringers  bolus 1,000 mL (1,000 mLs Intravenous New Bag/Given 07/25/24 1330)  iohexol  (OMNIPAQUE ) 350 MG/ML injection 75 mL (75 mLs Intravenous Contrast Given 07/25/24 1349)  ceFEPIme  (MAXIPIME ) 2 g in sodium chloride  0.9 % 100 mL IVPB (0 g Intravenous Stopped 07/25/24 1446)  metroNIDAZOLE  (FLAGYL ) IVPB 500 mg (0 mg Intravenous Stopped 07/25/24 1542)  iohexol  (OMNIPAQUE ) 350 MG/ML injection 100 mL (100 mLs Intravenous Contrast Given 07/25/24 1546)    Clinical Course as of 07/25/24 1637  Mon Jul 25, 2024  1330 Found on the floor next to bed.  Presumed fall as she has bruising to her right eye.  Baseline mentation somewhat unclear at this point, EMS report that she was conversant.  Was satting at 90% on EMSs arrival.  Blood sugar in 400s with them.  Hypertensive.  Patient is protecting her airway, localizes to pain in  lower extremities and left upper extremity, not having as much movement in her right upper extremity.  Last known normal was last night when she went to bed. [TY]  1344 pH, Ven: 7.401 [TY]  1354 Lactic Acid, Venous(!!): 4.7 [TY]  1354 Patient is febrile and tachycardic.  Will start empiric antibiotics.  No overt infectious source identified on exam. [TY]  1414 Patient's eyes open somewhat tracking, she is is trying to mumble words now. [TY]  [id number] Spoke with critical care for admission. [TY]    Clinical Course User Index [TY] Neysa Caron PARAS, DO                                 Medical Decision Making This is a 73 year old female complex past medical history to include obesity, hypertension hyperlipidemia diabetes presenting emergency department after being found on the ground unresponsive.  Borderline febrile with rectal temperature of 100.1, is tachycardic in the 120s, sinus rhythm.  Hypertensive.  Physical exam with bruising and ecchymosis to right  cheek and swelling to her right hand otherwise no signs of trauma.  Covered empirically with antibiotics, as she with borderline fever tachycardia leukocytosis elevated lactateno external source.SABRA  UA with UTI.  CT head, CT cervical spine CT chest abdomen pelvis without significant abnormalities.  Patient minimally improved, did not initially activate code stroke given unknown last known normal, but with patient's inability to move right upper extremity concern for possible stroke.  After discussion with Dr. Belvie, recommending activating as a code stroke.  Follow-up CTA without LVO.  Neurology recommending EEG, MRI.  It does appear that she is also in rhabdo with a CK of 4000 which may explain her forearm swelling.  She is hyperglycemic, but does not appear to be in DKA.  Patient's mentation failed to improve and was intubated for airway protection.  Amount and/or Complexity of Data Reviewed Independent Historian:     Details: Family present at  bedside.  Confirmed CODE STATUS.  Full code.  1 intubation. External Data Reviewed:     Details: Not on a blood thinner Labs: ordered. Decision-making details documented in ED Course. Radiology: ordered and independent interpretation performed.    Details: Do not appreciate obvious intracranial hemorrhage. ECG/medicine tests: ordered and independent interpretation performed.    Details: Sinus tach  Risk Prescription drug management. Parenteral controlled substances. Decision regarding hospitalization. Diagnosis or treatment significantly limited by social determinants of health.        Final diagnoses:  None    ED Discharge Orders     None          Neysa Caron PARAS, DO 07/25/24 1624

## 2024-07-25 NOTE — ED Notes (Signed)
 No changes, to CT on monitor with RN

## 2024-07-25 NOTE — H&P (Signed)
 NAME:  Carolyn Hunter, MRN:  [id number], DOB:  July 20, 1951, LOS: 0 ADMISSION DATE:  07/25/2024, CONSULTATION DATE:  12/15 REFERRING MD:  Dr. Neysa, CHIEF COMPLAINT:  AMS   History of Present Illness:  Patient is a 73 yo F w/ pertinent PMH HTN, HLD, T2DM brought in to [facility] on 12/15 w/ acute encephalopathy.  On 12/15 patient found down on ground. Apparently fell yesterday. Last known well unclear.  Not protecting airway consistently in ER so intubated for airway protection.  Was potentially moving left side less so stroke Cts performed which were neg.  PCCM to admit.  Pertinent  Medical History   Past Medical History:  Diagnosis Date   Anxiety    Cataract    OS   Diabetes mellitus without complication (HCC)    Diabetic retinopathy (HCC)    NPDR OU   Hyperlipidemia    Hypertension    Hypertensive retinopathy    OU   Tremors of nervous system    just when I get in a nervous Situation.     Significant Hospital Events: Including procedures, antibiotic start and stop dates in addition to other pertinent events   12/15 admit  Interim History / Subjective:  admit  Objective    Blood pressure (!) 164/103, pulse (!) 135, temperature 100.1 F (37.8 C), temperature source Rectal, resp. rate 19, weight 64.9 kg, SpO2 100%.    Vent Mode: PRVC FiO2 (%):  [40 %] 40 % Set Rate:  [18 bmp] 18 bmp Vt Set:  [480 mL] 480 mL PEEP:  [5 cmH20] 5 cmH20 Plateau Pressure:  [16 cmH20] 16 cmH20   Intake/Output Summary (Last 24 hours) at 07/25/2024 1639 Last data filed at 07/25/2024 1511 Gross per 24 hour  Intake 1100 ml  Output --  Net 1100 ml   Filed Weights   07/25/24 1326  Weight: 64.9 kg    Examination: General: sedated/paralyzed on vent HENT: pinpoint, equal, ecchymoses noted R eye, neck easy flexion Lungs: mostly clear, passive on vent Cardiovascular: regular, ext warm Abdomen: soft, hypoactive BS Extremities: some scattered bruising Neuro: GCS3: intubated/paralyzed GU:  foley mostly clear urine  Lactate up UA positive BMP benign WBC up slightly  Resolved problem list   Assessment and Plan  Acute metabolic encephalopathy- differential includes septic from UTI, stroke (no LVO on imaging), polypharmacy (nothing on med list to indicate), seizure (EEG pending), PRES, thyroid  (TSH pending), HE (ammonia pending) among others  Fall- head/C spine/ chest/abd/pelvis neg  Rhabdomyolysis- BMP reassuring, getting fluids  DM2 with hyperglycemia- SSI  HTN  Hx diabetic macular edema  - EEG, MRI, Utox, TSH, ammonia - Vent bundle, sedation for RASS -1 to 0 - After MRI and EEG suspect can wean sedation and consider extubation as early as tomorrow - Hopefully just related to UTI and turns around quickly - Had ketones in urine but no AG nor b hydroxybutyrate - Aggressive IVF - APP called family x 2, no answer  Labs   CBC: Recent Labs  Lab 07/25/24 1324 07/25/24 1336  WBC 13.5*  --   NEUTROABS 11.2*  --   HGB 13.7 13.9  14.3  HCT 39.9 41.0  42.0  MCV 94.1  --   PLT 239  --     Basic Metabolic Panel: Recent Labs  Lab 07/25/24 1324 07/25/24 1336  NA 137 135  135  K 4.2 4.3  4.3  CL 103 102  CO2 23  --   GLUCOSE 284* 284*  BUN 9 10  CREATININE 0.87 0.80  CALCIUM  9.0  --    GFR: CrCl cannot be calculated (Unknown ideal weight.). Recent Labs  Lab 07/25/24 1324 07/25/24 1336  WBC 13.5*  --   LATICACIDVEN  --  4.7*    Liver Function Tests: Recent Labs  Lab 07/25/24 1324  AST 48*  ALT 19  ALKPHOS 70  BILITOT 1.2  PROT 7.5  ALBUMIN 3.7   No results for input(s): LIPASE, AMYLASE in the last 168 hours. No results for input(s): AMMONIA in the last 168 hours.  ABG    Component Value Date/Time   HCO3 23.6 07/25/2024 1336   TCO2 23 07/25/2024 1336   TCO2 25 07/25/2024 1336   ACIDBASEDEF 1.0 07/25/2024 1336   O2SAT 64 07/25/2024 1336     Coagulation Profile: Recent Labs  Lab 07/25/24 1324  INR 1.1    Cardiac  Enzymes: Recent Labs  Lab 07/25/24 1324  CKTOTAL 4,216*  CKMB 31.8*    HbA1C: HbA1c, POC (prediabetic range)  Date/Time Value Ref Range Status  01/05/2020 09:29 AM 7.6 (A) 5.7 - 6.4 % Final   HbA1c, POC (controlled diabetic range)  Date/Time Value Ref Range Status  01/05/2020 09:29 AM 7.6 (A) 0.0 - 7.0 % Final   HbA1c POC (<> result, manual entry)  Date/Time Value Ref Range Status  01/05/2020 09:29 AM 7.6 4.0 - 5.6 % Final   Hgb A1c MFr Bld  Date/Time Value Ref Range Status  07/30/2021 08:12 AM 6.7 (H) 4.8 - 5.6 % Final    Comment:             Prediabetes: 5.7 - 6.4          Diabetes: >6.4          Glycemic control for adults with diabetes: <7.0   03/29/2021 08:16 AM 6.9 (H) 4.8 - 5.6 % Final    Comment:             Prediabetes: 5.7 - 6.4          Diabetes: >6.4          Glycemic control for adults with diabetes: <7.0     CBG: No results for input(s): GLUCAP in the last 168 hours.  Review of Systems:   Coma  Past Medical History:  She,  has a past medical history of Anxiety, Cataract, Diabetes mellitus without complication (HCC), Diabetic retinopathy (HCC), Hyperlipidemia, Hypertension, Hypertensive retinopathy, and Tremors of nervous system.   Surgical History:   Past Surgical History:  Procedure Laterality Date   CATARACT EXTRACTION W/PHACO Right 10/18/2018   Procedure: CATARACT EXTRACTION PHACO AND INTRAOCULAR LENS PLACEMENT (IOC);  Surgeon: Harrie Agent, MD;  Location: [facility];  Service: Ophthalmology;  Laterality: Right;  CDE: 8.11   CATARACT EXTRACTION W/PHACO Left 01/17/2019   Procedure: CATARACT EXTRACTION PHACO AND INTRAOCULAR LENS PLACEMENT (IOC);  Surgeon: Harrie Agent, MD;  Location: [facility];  Service: Ophthalmology;  Laterality: Left;  CDE: 6.38   CESAREAN SECTION     1988   COLONOSCOPY WITH PROPOFOL  N/A 05/04/2020   Procedure: COLONOSCOPY WITH PROPOFOL ;  Surgeon: Eartha Angelia Sieving, MD;  Location: [facility];  Service: Gastroenterology;   Laterality: N/A;  830   EYE SURGERY Right    Cataract extraction OD   POLYPECTOMY  05/04/2020   Procedure: POLYPECTOMY;  Surgeon: Eartha Angelia Sieving, MD;  Location: [facility];  Service: Gastroenterology;;     Social History:   reports that she has never smoked. She has never used smokeless tobacco.  She reports that she does not drink alcohol  and does not use drugs.   Family History:  Her family history includes Asthma in her father; Breast cancer in her mother; Cancer in her mother; Diabetes in her brother, son, and son.   Allergies Allergies[1]   Home Medications  Prior to Admission medications  Medication Sig Start Date End Date Taking? Authorizing Provider  Aflibercept  (EYLEA  IO) Inject 1 Dose into the eye every 30 (thirty) days. Injectable med given @ retina office q 12 wks.    [provider]  amLODipine  (NORVASC ) 5 MG tablet TAKE 1 TABLET BY MOUTH EVERY DAY 04/17/21   Elnor Fairy HERO, NP  Cholecalciferol (VITAMIN D3) 50 MCG (2000 UT) TABS Take 2,000 Units by mouth daily.    [provider]  Liniments (SALONPAS PAIN RELIEF PATCH EX) Place 1 patch onto the skin daily as needed (pain.).    [provider]  metFORMIN  (GLUCOPHAGE ) 1000 MG tablet TAKE 1 TABLET BY MOUTH EVERY DAY WITH BREAKFAST 07/06/23   Antonetta Rollene BRAVO, MD  propranolol  ER (INDERAL  LA) 60 MG 24 hr capsule TAKE 1 CAPSULE BY MOUTH EVERY DAY 05/17/21   Elnor Fairy HERO, NP  rosuvastatin  (CRESTOR ) 40 MG tablet Take 1 tablet (40 mg total) by mouth daily. 08/29/21   Paseda, Folashade R, FNP     Critical care time: 34 mins             [1]  Allergies Allergen Reactions   Ace Inhibitors Cough   Zetia  [Ezetimibe ] Swelling    Sore throat

## 2024-07-25 NOTE — Progress Notes (Signed)
 eLink Physician-Brief Progress Note Patient Name: Carolyn Hunter DOB: 07/09/1951 MRN: [id number]   Date of Service  07/25/2024  HPI/Events of Note  Hypotensive  on Fent and Prop on the vent, Asked to reduce sedation but MRI planned Labs incl ABG ok  eICU Interventions  Add peri levo Once back from MRI, can lower sedation & come off Ct IVFs for mild rhabdo     Intervention Category Major Interventions: Hypotension - evaluation and management  Kerwin Augustus V. 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  Lower sedation , goal RASS 0 /-1  1:07 AM

## 2024-07-25 NOTE — Progress Notes (Signed)
 Pt transported from 2M15 to MRI via ventilator and back with no compicattions

## 2024-07-25 NOTE — ED Notes (Signed)
 Back from CT, no changes, sonorous resps with upper airway noises. Shivering/ tremulous.

## 2024-07-25 NOTE — ED Triage Notes (Signed)
 BIB [place] from home s/p unwitnessed fall, unresponsive, GCS 7. Husband with dementia present. Husband called 911. Found on floor unresponsive. C-collar applied. NRB applied. NSL x 2 established. VSS. 90% RA up to 99% on NRB. HR 124 ST. RR 22, BP 202/86. CBG 474. Axillary temp 100.2. Bruising and swelling noted to R eye and face. R eye swollen shut. EDP present on arrival. Remains unchanged from EMS report.

## 2024-07-26 ENCOUNTER — Inpatient Hospital Stay (HOSPITAL_COMMUNITY)

## 2024-07-26 DIAGNOSIS — G9340 Encephalopathy, unspecified: Secondary | ICD-10-CM | POA: Diagnosis not present

## 2024-07-26 DIAGNOSIS — Z7984 Long term (current) use of oral hypoglycemic drugs: Secondary | ICD-10-CM | POA: Diagnosis not present

## 2024-07-26 DIAGNOSIS — J9811 Atelectasis: Secondary | ICD-10-CM

## 2024-07-26 DIAGNOSIS — E1151 Type 2 diabetes mellitus with diabetic peripheral angiopathy without gangrene: Secondary | ICD-10-CM | POA: Diagnosis not present

## 2024-07-26 DIAGNOSIS — I6350 Cerebral infarction due to unspecified occlusion or stenosis of unspecified cerebral artery: Secondary | ICD-10-CM | POA: Diagnosis not present

## 2024-07-26 DIAGNOSIS — I69391 Dysphagia following cerebral infarction: Secondary | ICD-10-CM | POA: Diagnosis not present

## 2024-07-26 DIAGNOSIS — E785 Hyperlipidemia, unspecified: Secondary | ICD-10-CM | POA: Diagnosis not present

## 2024-07-26 DIAGNOSIS — S0011XA Contusion of right eyelid and periocular area, initial encounter: Secondary | ICD-10-CM

## 2024-07-26 DIAGNOSIS — I6389 Other cerebral infarction: Secondary | ICD-10-CM

## 2024-07-26 DIAGNOSIS — I4891 Unspecified atrial fibrillation: Secondary | ICD-10-CM | POA: Diagnosis not present

## 2024-07-26 DIAGNOSIS — I10 Essential (primary) hypertension: Secondary | ICD-10-CM | POA: Diagnosis not present

## 2024-07-26 DIAGNOSIS — R29725 NIHSS score 25: Secondary | ICD-10-CM | POA: Diagnosis not present

## 2024-07-26 DIAGNOSIS — W19XXXA Unspecified fall, initial encounter: Secondary | ICD-10-CM

## 2024-07-26 DIAGNOSIS — R569 Unspecified convulsions: Secondary | ICD-10-CM | POA: Diagnosis not present

## 2024-07-26 DIAGNOSIS — J690 Pneumonitis due to inhalation of food and vomit: Secondary | ICD-10-CM | POA: Diagnosis not present

## 2024-07-26 LAB — CBC
HCT: 36.7 % (ref 36.0–46.0)
Hemoglobin: 12.3 g/dL (ref 12.0–15.0)
MCH: 32.5 pg (ref 26.0–34.0)
MCHC: 33.5 g/dL (ref 30.0–36.0)
MCV: 96.8 fL (ref 80.0–100.0)
Platelets: 189 K/uL (ref 150–400)
RBC: 3.79 MIL/uL — ABNORMAL LOW (ref 3.87–5.11)
RDW: 12.2 % (ref 11.5–15.5)
WBC: 12.8 K/uL — ABNORMAL HIGH (ref 4.0–10.5)
nRBC: 0 % (ref 0.0–0.2)

## 2024-07-26 LAB — BLOOD CULTURE ID PANEL (REFLEXED) - BCID2

## 2024-07-26 LAB — BASIC METABOLIC PANEL WITH GFR
Anion gap: 8 (ref 5–15)
BUN: 13 mg/dL (ref 8–23)
CO2: 25 mmol/L (ref 22–32)
Calcium: 8.4 mg/dL — ABNORMAL LOW (ref 8.9–10.3)
Chloride: 101 mmol/L (ref 98–111)
Creatinine, Ser: 0.97 mg/dL (ref 0.44–1.00)
GFR, Estimated: >60 mL/min (ref >60)
Glucose, Bld: 246 mg/dL — ABNORMAL HIGH (ref 70–99)
Potassium: 3.7 mmol/L (ref 3.5–5.1)
Sodium: 134 mmol/L — ABNORMAL LOW (ref 135–145)

## 2024-07-26 LAB — ECHOCARDIOGRAM COMPLETE
AR max vel: 2.73 cm2
AV Area VTI: 2.68 cm2
AV Area mean vel: 2.62 cm2
AV Mean grad: 3 mmHg
AV Peak grad: 5.2 mmHg
Ao pk vel: 1.15 m/s
Area-P 1/2: 4.06 cm2
Height: 63 in
S' Lateral: 2.8 cm
Weight: 2532.64 [oz_av]

## 2024-07-26 LAB — LIPID PANEL
Cholesterol: 234 mg/dL — ABNORMAL HIGH (ref 0–200)
HDL: 55 mg/dL (ref >40)
LDL Cholesterol: 145 mg/dL — ABNORMAL HIGH (ref 0–99)
Total CHOL/HDL Ratio: 4.3 ratio
Triglycerides: 170 mg/dL — ABNORMAL HIGH (ref <150)
VLDL: 34 mg/dL (ref 0–40)

## 2024-07-26 LAB — GLUCOSE, CAPILLARY
Glucose-Capillary: 266 mg/dL — ABNORMAL HIGH (ref 70–99)
Glucose-Capillary: 215 mg/dL — ABNORMAL HIGH (ref 70–99)
Glucose-Capillary: 190 mg/dL — ABNORMAL HIGH (ref 70–99)
Glucose-Capillary: 179 mg/dL — ABNORMAL HIGH (ref 70–99)
Glucose-Capillary: 158 mg/dL — ABNORMAL HIGH (ref 70–99)
Glucose-Capillary: 171 mg/dL — ABNORMAL HIGH (ref 70–99)

## 2024-07-26 LAB — TRIGLYCERIDES: Triglycerides: 167 mg/dL — ABNORMAL HIGH (ref <150)

## 2024-07-26 LAB — MAGNESIUM: Magnesium: 1.6 mg/dL — ABNORMAL LOW (ref 1.7–2.4)

## 2024-07-26 LAB — PHOSPHORUS: Phosphorus: 2.7 mg/dL (ref 2.5–4.6)

## 2024-07-26 MED ORDER — PERFLUTREN LIPID MICROSPHERE
1.0000 mL | INTRAVENOUS | Status: DC | PRN
  Administered 2024-07-26: 14:00:00 4 mL via INTRAVENOUS

## 2024-07-26 MED ORDER — INSULIN ASPART 100 UNIT/ML IJ SOLN
0.0000 [IU] | INTRAMUSCULAR | Status: DC
  Administered 2024-07-26 – 2024-07-27 (×8): 3 [IU] via SUBCUTANEOUS
  Administered 2024-07-27: 03:00:00 2 [IU] via SUBCUTANEOUS
  Administered 2024-07-28: 20:00:00 8 [IU] via SUBCUTANEOUS
  Administered 2024-07-28: 5 [IU] via SUBCUTANEOUS
  Administered 2024-07-28: 12:00:00 2 [IU] via SUBCUTANEOUS
  Administered 2024-07-28: 08:00:00 3 [IU] via SUBCUTANEOUS
  Administered 2024-07-28: 03:00:00 2 [IU] via SUBCUTANEOUS
  Administered 2024-07-28 – 2024-07-29 (×2): 5 [IU] via SUBCUTANEOUS
  Administered 2024-07-29: 3 [IU] via SUBCUTANEOUS
  Administered 2024-07-29: 2 [IU] via SUBCUTANEOUS
  Administered 2024-07-29: 5 [IU] via SUBCUTANEOUS
  Administered 2024-07-29 (×2): 2 [IU] via SUBCUTANEOUS
  Administered 2024-07-30 (×2): 3 [IU] via SUBCUTANEOUS
  Filled 2024-07-26: qty 5
  Filled 2024-07-26: qty 2
  Filled 2024-07-26 (×5): qty 3
  Filled 2024-07-26: qty 2
  Filled 2024-07-26: qty 3
  Filled 2024-07-26: qty 2
  Filled 2024-07-26 (×2): qty 3
  Filled 2024-07-26: qty 8
  Filled 2024-07-26: qty 2
  Filled 2024-07-26 (×2): qty 5
  Filled 2024-07-26: qty 2
  Filled 2024-07-26: qty 5
  Filled 2024-07-26 (×2): qty 3
  Filled 2024-07-26: qty 2
  Filled 2024-07-26 (×2): qty 3

## 2024-07-26 MED ORDER — ASPIRIN 81 MG PO CHEW
81.0000 mg | CHEWABLE_TABLET | Freq: Once | ORAL | Status: AC
  Administered 2024-07-26: 11:00:00 81 mg
  Filled 2024-07-26: qty 1

## 2024-07-26 MED ORDER — POTASSIUM CHLORIDE 20 MEQ PO PACK
40.0000 meq | PACK | Freq: Once | ORAL | Status: AC
  Administered 2024-07-26: 09:00:00 40 meq
  Filled 2024-07-26: qty 2

## 2024-07-26 MED ORDER — LEVETIRACETAM (KEPPRA) 500 MG/5 ML ADULT IV PUSH
4000.0000 mg | Freq: Once | INTRAVENOUS | Status: AC
  Administered 2024-07-26: 11:00:00 4000 mg via INTRAVENOUS
  Filled 2024-07-26: qty 40

## 2024-07-26 MED ORDER — ACETAMINOPHEN 325 MG PO TABS
650.0000 mg | ORAL_TABLET | Freq: Four times a day (QID) | ORAL | Status: DC | PRN
  Administered 2024-07-26 – 2024-08-01 (×5): 650 mg
  Filled 2024-07-26 (×5): qty 2

## 2024-07-26 MED ORDER — INSULIN GLARGINE 100 UNIT/ML ~~LOC~~ SOLN
5.0000 [IU] | Freq: Every day | SUBCUTANEOUS | Status: DC
  Administered 2024-07-26 – 2024-07-27 (×2): 5 [IU] via SUBCUTANEOUS
  Filled 2024-07-26 (×2): qty 0.05

## 2024-07-26 MED ORDER — GLUCERNA 1.5 CAL PO LIQD
1000.0000 mL | ORAL | Status: DC
  Administered 2024-07-26 – 2024-08-01 (×6): 1000 mL
  Filled 2024-07-26 (×10): qty 1000

## 2024-07-26 MED ORDER — SODIUM CHLORIDE 0.9 % IV SOLN
2.0000 g | INTRAVENOUS | Status: AC
  Administered 2024-07-26 – 2024-07-30 (×5): 2 g via INTRAVENOUS
  Filled 2024-07-26 (×5): qty 20

## 2024-07-26 MED ORDER — PANTOPRAZOLE SODIUM 40 MG IV SOLR
40.0000 mg | Freq: Every day | INTRAVENOUS | Status: DC
  Administered 2024-07-26 – 2024-07-29 (×4): 40 mg via INTRAVENOUS
  Filled 2024-07-26 (×4): qty 10

## 2024-07-26 MED ORDER — ASPIRIN 81 MG PO CHEW
81.0000 mg | CHEWABLE_TABLET | Freq: Every day | ORAL | Status: DC
  Administered 2024-07-27 – 2024-08-02 (×7): 81 mg
  Filled 2024-07-26 (×7): qty 1

## 2024-07-26 MED ORDER — MAGNESIUM SULFATE 2 GM/50ML IV SOLN
2.0000 g | Freq: Once | INTRAVENOUS | Status: AC
  Administered 2024-07-26: 09:00:00 2 g via INTRAVENOUS
  Filled 2024-07-26: qty 50

## 2024-07-26 NOTE — Procedures (Signed)
 Patient Name: Carolyn Hunter  MRN: [id number]  Epilepsy Attending: Arlin MALVA Krebs  Referring Physician/Provider: Michaela Aisha SQUIBB, MD  Date: 07/26/2024 Duration: 22.39 mins  Patient history: 73yo F with ams. EEG to evaluate for seizure  Level of alertness: comatose  AEDs during EEG study: LEV, Propofol   Technical aspects: This EEG study was done with scalp electrodes positioned according to the 10-20 International system of electrode placement. Electrical activity was reviewed with band pass filter of 1-70Hz , sensitivity of 7 uV/mm, display speed of 23mm/sec with a 60Hz  notched filter applied as appropriate. EEG data were recorded continuously and digitally stored.  Video monitoring was available and reviewed as appropriate.  Description: EEG showed continuous generalized 3 to 6 Hz theta-delta slowing. Hyperventilation and photic stimulation were not performed.     ABNORMALITY - Continuous slow, generalized  IMPRESSION: This study is suggestive of generalized cerebral dysfunction (encephalopathy). No seizures or epileptiform discharges were seen throughout the recording.  Verita Kuroda O Dereke Neumann

## 2024-07-26 NOTE — Progress Notes (Signed)
 OT Cancellation Note  Patient Details Name: Carolyn Hunter MRN: [id number] DOB: 1951/06/24   Cancelled Treatment:    Reason Eval/Treat Not Completed: Patient not medically ready Off of sedation this AM but remains on vent, not responsive and no command following. Will hold OT eval until pt more able to participate and check back later as able.   Mliss Fish 07/26/2024, 9:52 AM

## 2024-07-26 NOTE — TOC CM/SW Note (Signed)
 Transition of Care [facility]) - Inpatient Brief Assessment   Patient Details  Name: Carolyn Hunter MRN: [id number] Date of Birth: 05/06/1951  Transition of Care [facility]) CM/SW Contact:    Lauraine FORBES Saa, LCSWA Phone Number: 07/26/2024, 10:24 AM   Clinical Narrative:  10:24 AM Per chart review, patient resides at home. Patient has a PCP and insurance. Patient does not have SNF/HH/DME history. Patient's preferred pharmacy's are [facility] [address] and [facility] [employer]. Patient is currently intubated with feeding tube and unable to answer SDOH questions. No other TOC needs identified at this time. TOC will continue to follow.  Transition of Care Asessment: Insurance and Status: Insurance coverage has been reviewed Patient has primary care physician: Yes Home environment has been reviewed: Private Residence Prior level of function:: N/A Prior/Current Home Services: No current home services Social Drivers of Health Review:  (Patient unable to answer) Readmission risk has been reviewed: Yes (Currently Green 11%) Transition of care needs: no transition of care needs at this time

## 2024-07-26 NOTE — Progress Notes (Signed)
 SLP Cancellation Note  Patient Details Name: Carolyn Hunter MRN: [id number] DOB: Sep 05, 1950   Cancelled treatment:       Reason Eval/Treat Not Completed: Patient not medically ready   Henderson Frampton, Consuelo Fitch 07/26/2024, 11:34 AM

## 2024-07-26 NOTE — Plan of Care (Signed)
°  Problem: Clinical Measurements: Goal: Ability to maintain clinical measurements within normal limits will improve Outcome: Progressing Goal: Will remain free from infection Outcome: Progressing Goal: Diagnostic test results will improve Outcome: Progressing Goal: Respiratory complications will improve Outcome: Progressing Goal: Cardiovascular complication will be avoided Outcome: Progressing   Problem: Elimination: Goal: Will not experience complications related to urinary retention Outcome: Progressing   Problem: Pain Managment: Goal: General experience of comfort will improve and/or be controlled Outcome: Progressing   Problem: Ischemic Stroke/TIA Tissue Perfusion: Goal: Complications of ischemic stroke/TIA will be minimized Outcome: Progressing   Problem: Education: Goal: Knowledge of General Education information will improve Description: Including pain rating scale, medication(s)/side effects and non-pharmacologic comfort measures Outcome: Not Progressing   Problem: Health Behavior/Discharge Planning: Goal: Ability to manage health-related needs will improve Outcome: Not Progressing   Problem: Activity: Goal: Risk for activity intolerance will decrease Outcome: Not Progressing   Problem: Nutrition: Goal: Adequate nutrition will be maintained Outcome: Not Progressing   Problem: Elimination: Goal: Will not experience complications related to bowel motility Outcome: Not Progressing   Problem: Safety: Goal: Ability to remain free from injury will improve Outcome: Not Progressing   Problem: Education: Goal: Knowledge of disease or condition will improve Outcome: Not Progressing   Problem: Self-Care: Goal: Ability to participate in self-care as condition permits will improve Outcome: Not Progressing   Problem: Nutrition: Goal: Risk of aspiration will decrease Outcome: Not Progressing Goal: Dietary intake will improve Outcome: Not Progressing

## 2024-07-26 NOTE — Progress Notes (Signed)
 Initial Nutrition Assessment  DOCUMENTATION CODES:  Not applicable  INTERVENTION:  Initiate tube feeding via OGT: Glucerna 1.5 at 45 ml/h (1080 ml per day) Start at 15 and advance by 10mL every 8 hours to reach goal Provides 1620 kcal, 89 gm protein, 820 ml free water  daily  NUTRITION DIAGNOSIS:  Inadequate oral intake related to inability to eat as evidenced by NPO status.  GOAL:  Patient will meet greater than or equal to 90% of their needs  MONITOR:  I & O's, Vent status, Labs  REASON FOR ASSESSMENT:  Consult, Ventilator Enteral/tube feeding initiation and management  ASSESSMENT:  Pt with hx of HTN, HLD, and DM type 2 presented to ED after being found down at home unresponsive.   12/15 - presented to ED, intubated   Patient is currently intubated on ventilator support. Husband and son at bedside able to provide a hx. Report that appetite has been stable and pt was in her normal state of health until Saturday. Husband states they eat breakfast and lunch and then a smaller evening meal. Also reports that pt is active. Son states she likes to go on walks outside.   On exam, pt well nourished. Significant bruising to the right orbital region from fall.   Discussed in rounds, pt's MRI showed small infarcts but current presentation more severe than what small CVAs would cause. Pt to be placed on continuous EEG monitoring to look for seizure activity.   MV: 8.9 L/min Temp (24hrs), Avg:99.2 F (37.3 C), Min:98.3 F (36.8 C), Max:100.3 F (37.9 C) MAP (cuff): 66-74 mmHg  Admit weight: 64.9 kg  Current weight: 71.8 kg    Intake/Output Summary (Last 24 hours) at 07/26/2024 1404 Last data filed at 07/26/2024 1138 Gross per 24 hour  Intake 3116.3 ml  Output 655 ml  Net 2461.3 ml  Net IO Since Admission: 2,461.3 mL [07/26/24 1404]  Drains/Lines: OGT, 18 Fr. UOP x 24 hours  Nutritionally Relevant Medications: Scheduled Meds:  insulin  aspart  0-15 Units  Subcutaneous Q4H   insulin  glargine  5 Units Subcutaneous Daily   pantoprazole  IV  40 mg Intravenous QHS   rosuvastatin   40 mg Per Tube Daily   Continuous Infusions:  sodium chloride  Stopped (07/26/24 0650)   cefTRIAXone  (ROCEPHIN )  IV     fentaNYL  infusion INTRAVENOUS 25 mcg/hr (07/26/24 0700)   norepinephrine  (LEVOPHED ) Adult infusion Stopped (07/26/24 9356)   propofol  (DIPRIVAN ) infusion Stopped (07/26/24 0409)   PRN Meds:.docusate, polyethylene glycol  Labs Reviewed: Sodium 134 Magnesium  1.6 CBG ranges from 201-266 mg/dL over the last 24 hours HgbA1c 6.4% (12/15)  NUTRITION - FOCUSED PHYSICAL EXAM: Flowsheet Row Most Recent Value  Orbital Region No depletion  Upper Arm Region Mild depletion  [right side only]  Thoracic and Lumbar Region No depletion  Buccal Region No depletion  Temple Region No depletion  Clavicle Bone Region No depletion  Clavicle and Acromion Bone Region No depletion  Scapular Bone Region No depletion  Dorsal Hand Unable to assess  [mittens]  Patellar Region Mild depletion  Anterior Thigh Region No depletion  Posterior Calf Region No depletion  Edema (RD Assessment) None  Hair Reviewed  Eyes Reviewed  Mouth Reviewed  Skin Reviewed  Nails Reviewed    Diet Order:   Diet Order             Diet NPO time specified  Diet effective now  EDUCATION NEEDS:  Education needs have been addressed  Skin:  Skin Assessment: Reviewed RN Assessment  Last BM:  unsure  Height:  Ht Readings from Last 1 Encounters:  07/25/24 5' 3 (1.6 m)    Weight:  Wt Readings from Last 1 Encounters:  07/26/24 71.8 kg    Ideal Body Weight:  52.3 kg  BMI:  Body mass index is 28.04 kg/m.  Estimated Nutritional Needs:  Kcal:  1500-1700 kcal/d Protein:  75-90 g/d Fluid:  >/=1.5L/d    Vernell Lukes, RD, LDN, CNSC Registered Dietitian II Please reach out via secure chat

## 2024-07-26 NOTE — Progress Notes (Addendum)
 STROKE TEAM PROGRESS NOTE    SIGNIFICANT HOSPITAL EVENTS 12/15: Admitted  INTERIM HISTORY/SUBJECTIVE  Pt's husband and daughter at bedside. Husband has some memory trouble, daughter primary history provider. Reports that Pt was ok on Saturday, but not feeling well on Sunday and was in bed most of Sunday, maybe the entire day. Was brought in and admitted on Monday. Time down unclear, but CK 4216. Routine EEG negative. Pt intubated and 2 hours off of sedation at time of exam at 10am this morning. Daughter, Zoiee Wimmer [phone] would like to be updated daily if she is not able to be at bedside during rounds.  OBJECTIVE  CBC    Component Value Date/Time   WBC 12.8 (H) 07/26/2024 0603   RBC 3.79 (L) 07/26/2024 0603   HGB 12.3 07/26/2024 0603   HGB 13.1 07/30/2021 0812   HCT 36.7 07/26/2024 0603   HCT 37.3 07/30/2021 0812   PLT 189 07/26/2024 0603   PLT 218 07/30/2021 0812   MCV 96.8 07/26/2024 0603   MCV 90 07/30/2021 0812   MCH 32.5 07/26/2024 0603   MCHC 33.5 07/26/2024 0603   RDW 12.2 07/26/2024 0603   RDW 11.2 (L) 07/30/2021 0812   LYMPHSABS 0.7 07/25/2024 1324   LYMPHSABS 1.8 07/30/2021 0812   MONOABS 1.5 (H) 07/25/2024 1324   EOSABS 0.0 07/25/2024 1324   EOSABS 0.3 07/30/2021 0812   BASOSABS 0.0 07/25/2024 1324   BASOSABS 0.0 07/30/2021 0812    BMET    Component Value Date/Time   NA 134 (L) 07/26/2024 0603   NA 141 07/30/2021 0812   K 3.7 07/26/2024 0603   CL 101 07/26/2024 0603   CO2 25 07/26/2024 0603   GLUCOSE 246 (H) 07/26/2024 0603   BUN 13 07/26/2024 0603   BUN 10 07/30/2021 0812   CREATININE 0.97 07/26/2024 0603   CREATININE 0.78 01/10/2020 0803   CALCIUM  8.4 (L) 07/26/2024 0603   EGFR 75 07/30/2021 0812   GFRNONAA >60 07/26/2024 0603   GFRNONAA 79 01/10/2020 0803    IMAGING past 24 hours MR CERVICAL SPINE WO CONTRAST Result Date: 07/25/2024 EXAM: MRI CERVICAL SPINE WITHOUT CONTRAST 07/25/2024 10:04:14 PM TECHNIQUE: Multiplanar multisequence  MRI of the cervical spine was performed. COMPARISON: Comparison made with prior CT from earlier the same day. CLINICAL HISTORY: Myelopathy, acute, cervical spine. FINDINGS: BONES AND ALIGNMENT: Straightening with slight reversal of the normal cervical lordosis. No significant listhesis. Vertebral body height maintained without acute or chronic fracture. Bone marrow signal intensity within normal limits. No discrete or worrisome osseous lesions. No abnormal marrow edema. SPINAL CORD: Normal signal morphology seen within the cervical spinal cord. SOFT TISSUES: No paraspinal mass. Endotracheal and enteric tubes in place. VASCULATURE: Loss of normal flow void within the left vertebral artery, likely reflecting slow flow related to proximal stenosis as seen on prior CTA. C2-C3: Normal interspace. Mild bilateral facet spurring. No stenosis. C3-C4: Small central disc protrusion mildly indents ventral thecal sac. Mild left-sided facet hypertrophy. No spinal stenosis. Foramina remain patent. C4-C5: Mild disc bulge with uncovertebral spurring. Mild left-sided facet hypertrophy. No significant spinal stenosis. Foramina remain patent. C5-C6: Advanced degenerative changes and vertebral disc space narrowing. Left paracentral disc osteophyte complex flattens and indents the left ventral thecal sac. Mild cord flattening without cord signal changes. Moderate spinal stenosis. Uncovertebral spurring without significant foraminal encroachment. C6-C7: Small right paracentral disc protrusion mildly indents ventral thecal sac. No significant spinal stenosis. Foramina remain patent. C7-T1: Small left paracentral disc protrusion mildly flattens the ventral thecal sac.  Mild left-sided facet hypertrophy. No significant spinal stenosis. Foramina remain patent. IMPRESSION: 1. Normal MRI appearance of the cervical spinal cord. 2. Cervical spondylosis at C5-C6 with resultant moderate spinal stenosis. 3. Loss of normal flow void within the left  vertebral artery, presumably reflecting slow flow related to proximal stenosis as seen on prior CTA. Electronically signed by: Morene Hoard MD 07/25/2024 10:48 PM EST RP Workstation: [id number]   MR BRAIN WO CONTRAST Result Date: 07/25/2024 EXAM: MRI BRAIN WITHOUT CONTRAST 07/25/2024 10:04:14 PM TECHNIQUE: Multiplanar multisequence MRI of the head/brain was performed without the administration of intravenous contrast. COMPARISON: CT from earlier the same day. CLINICAL HISTORY: Delirium. FINDINGS: BRAIN AND VENTRICLES: Patchy T2/FLAIR hyperintensity involving the periventricular and deep white matter, consistent with chronic small vessel ischemic disease, moderate in nature. Few small patchy foci of restricted diffusion seen involving the posterior left parietal region, consistent with small acute ischemic infarcts (series 9, images 82, 83, 79). No associated acute intracranial hemorrhage or mass effect. Multiple scattered chronic microhemorrhages noted, favored to be hypertensive in nature. No mass. No midline shift. No hydrocephalus. The sella is unremarkable. Normal flow voids. ORBITS: Prior bilateral ocular lens replacement. SINUSES AND MASTOIDS: Scattered mucosal thickening is present about the paranasal sinuses. BONES AND SOFT TISSUES: Normal marrow signal. Diffuse soft tissue swelling present about the right frontal scalp/periorbital region, consistent with a contusion. The patient is intubated. IMPRESSION: 1. Small acute ischemic infarcts involving . the posterior left frontoparietal region. No associated hemorrhage or mass effect. 2. Underlying moderate chronic microvascular ischemic disease. 3. Right frontal scalp/periorbital soft tissue contusion. Electronically signed by: Morene Hoard MD 07/25/2024 10:36 PM EST RP Workstation: [id number]   DG Abd Portable 1 View Result Date: 07/25/2024 EXAM: 1 VIEW XRAY OF THE ABDOMEN 07/25/2024 04:45:00 PM COMPARISON: None available. CLINICAL  HISTORY: intubation OG tube placement FINDINGS: LINES, TUBES AND DEVICES: Enteric tube in place with tip in the mid stomach. BOWEL: Nonobstructive bowel gas pattern. SOFT TISSUES: Excreted contrast in the renal collecting systems. BONES: No acute fracture. IMPRESSION: 1. Enteric tube with tip in the mid stomach. Electronically signed by: Pinkie Pebbles MD 07/25/2024 05:42 PM EST RP Workstation: [id number]   DG Chest Portable 1 View Result Date: 07/25/2024 EXAM: 1 VIEW(S) XRAY OF THE CHEST 07/25/2024 04:45:00 PM COMPARISON: 07/25/2024 CLINICAL HISTORY: intubation FINDINGS: LINES, TUBES AND DEVICES: Endotracheal tube in place with tip 2 cm above the carina. Enteric tube in place with tip in the mid stomach. LUNGS AND PLEURA: Low lung volumes with vascular crowding. Right basilar opacity, favoring atelectasis. No pleural effusion. No pneumothorax. HEART AND MEDIASTINUM: No acute abnormality of the cardiac and mediastinal silhouettes. BONES AND SOFT TISSUES: No acute osseous abnormality. IMPRESSION: 1. Endotracheal tube and enteric tube in appropriate position. 2. Right basilar opacity, favoring atelectasis. Electronically signed by: Pinkie Pebbles MD 07/25/2024 05:41 PM EST RP Workstation: [id number]   CT ANGIO HEAD NECK W WO CM W PERF (CODE STROKE) LKW > 6h Result Date: 07/25/2024 EXAM: CTA Head and Neck with Perfusion 07/25/2024 03:46:29 PM TECHNIQUE: CTA of the head and neck was performed with the administration of intravenous contrast. 3D postprocessing with multiplanar reconstructions and MIPs was performed to evaluate the vascular anatomy. Cerebral perfusion analysis using computed tomography with contrast administration, including post-processing of parametric maps with determination of cerebral blood flow, cerebral blood volume, mean transit time and time-to-maximum. Automated exposure control, iterative reconstruction, and/or weight based adjustment of the mA/kV was utilized to reduce the radiation  dose to as low as reasonably  achievable. CONTRAST: Without and with IV contrast. 100 mL (iohexol  (OMNIPAQUE ) 350 MG/ML injection 100 mL IOHEXOL  350 MG/ML SOLN). COMPARISON: Same day CT head. CLINICAL HISTORY: Neuro deficit, acute, stroke suspected. FINDINGS: CTA NECK: AORTIC ARCH AND ARCH VESSELS: Mild atherosclerosis along the proximal left subclavian artery without significant stenosis. No dissection or arterial injury. No significant stenosis of the brachiocephalic or subclavian arteries. CERVICAL CAROTID ARTERIES: The right carotid artery is patent from the origin to the skull base. There is mild atherosclerosis along the distal right common carotid artery and at the right carotid bifurcation without hemodynamically significant stenosis. The left carotid artery is patent from the origin to the skull base. Mild atherosclerosis of the distal left common carotid artery and left carotid bifurcation without hemodynamically significant stenosis. No dissection or arterial injury. CERVICAL VERTEBRAL ARTERIES: The right vertebral artery is dominant. The vertebral arteries are patent from the origins to the vertebrobasilar confluence. There is long segment severe stenosis of the left V1 segment without evidence of occlusion. Additional atherosclerosis of the bilateral V4 segments without significant stenosis. No dissection or arterial injury. LUNGS AND MEDIASTINUM: Unremarkable. SOFT TISSUES: Subcentimeter nodules in the right thyroid  lobe. BONES: No acute abnormality. CTA HEAD: ANTERIOR CIRCULATION: The intracranial internal carotid arteries are patent bilaterally. Mild atherosclerosis of the carotid siphons without significant stenosis. The anterior cerebral arteries are patent bilaterally. Right MCA is patent. There is mild stenosis of the distal right M1 segment. The left MCA is patent. There is mild stenosis of the liver and kidney division branches of the left MCA without evidence of focal stenosis or evidence of  occlusion. Additional atherosclerotic irregularity of distal left MCA branches in the posterior aspect of the left MCA territory. No aneurysm. POSTERIOR CIRCULATION: There is moderate stenosis of the posterior P2 segment of the left PCA. No significant stenosis of the basilar artery. No significant stenosis of the vertebral arteries. No aneurysm. OTHER: No dural venous sinus thrombosis on this non-dedicated study. CT PERFUSION: EXAM QUALITY: The perfusion portion of the examination is significantly limited due to motion artifact. CORE INFARCT (CBF<30% volume): There is no region of cerebral blood flow less than 30 percent. 0 mL TOTAL HYPOPERFUSION (Tmax>6s volume): There are scattered areas of elevated Tmax greater than 6 seconds with a volume of 12 mL. PENUMBRA: Mismatch volume of 12 mL. The majority of the regions of elevated Tmax are favored to be artifactual given the degree of motion artifact. However, some of the more prolonged regions of elevated Tmax appear to localize within the anterior aspect of the left MCA territory, correlating with region of clinical concern. Recommend MRI brain for further evaluation. Mismatch ratio: not applicable Location: Anterior aspect of the left MCA territory. IMPRESSION: 1. No acute large vessel occlusion. 2. CT perfusion is significantly limited by motion artifact. Some regions of elevated Tmax appear to localize to the anterior left MCA territory, which could reflect true perfusion delay. Recommend MRI brain for further evaluation. 3. 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 4. Moderate stenosis of the posterior P2 segment of the left PCA. 5. Diminutive caliber of left M2 superior division branches without focal stenosis or occlusion. Atherosclerotic irregularity of distal left MCA branches in the posterior aspect of the left MCA territory. 6. Finding of no LVO and perfusion results were discussed with Dr. Michaela at 3:47 PM on  07/25/24. Electronically signed by: Donnice Mania MD 07/25/2024 04:17 PM EST RP Workstation: [id number]   CT HEAD CODE STROKE  WO CONTRAST Result Date: 07/25/2024 EXAM: CT HEAD WITHOUT CONTRAST 07/25/2024 03:42:53 PM TECHNIQUE: CT of the head was performed without the administration of intravenous contrast. Automated exposure control, iterative reconstruction, and/or weight based adjustment of the mA/kV was utilized to reduce the radiation dose to as low as reasonably achievable. COMPARISON: 07/25/2024 CLINICAL HISTORY: Neuro deficit, acute, stroke suspected. FINDINGS: BRAIN AND VENTRICLES: Diffuse age-appropriate atrophy throughout brain parenchyma. Mild periventricular white matter changes and mild ex vacuo ventricular dilatation. No acute hemorrhage. No evidence of acute infarct. No hydrocephalus. No extra-axial collection. No mass effect or midline shift. ORBITS: Right periorbital hematoma similar to prior exam. SINUSES: No acute abnormality. SOFT TISSUES AND SKULL: Right periorbital hematoma similar to prior exam. No skull fracture. [place] Stroke Program Early CT (ASPECT) Score: Ganglionic (caudate, internal capsule, lentiform nucleus, insula, M1-M3): 7 Supraganglionic (M4-M6): 3 Total: 10 IMPRESSION: 1. No acute intracranial abnormality. 2. ASPECTS 10. 3. Right periorbital hematoma, unchanged from prior exam. 4. Findings called to Dr. Michaela at 3:47 PM on 07/25/24. Electronically signed by: Donnice Mania MD 07/25/2024 04:00 PM EST RP Workstation: [id number]   DG Pelvis Portable Result Date: 07/25/2024 EXAM: 1 or 2 VIEW(S) XRAY OF THE PELVIS 07/25/2024 02:28:02 PM COMPARISON: CT chest, abdomen, and pelvis 07/25/2024. CLINICAL HISTORY: Unresponsive after unwitnessed fall. FINDINGS: BONES AND JOINTS: No acute fracture. Degenerative changes in the spine and hips. SI joints and symphysis pubis are not displaced. SOFT TISSUES: Residual contrast material in the bladder and renal collecting systems.  IMPRESSION: 1. No acute fracture or pelvic ring disruption identified. Electronically signed by: Elsie Gravely MD 07/25/2024 02:46 PM EST RP Workstation: [id number]   DG Forearm Right Result Date: 07/25/2024 EXAM: 1 VIEW(S) XRAY OF THE RIGHT FOREARM 07/25/2024 02:28:02 PM COMPARISON: None available. CLINICAL HISTORY: trauma trauma FINDINGS: BONES AND JOINTS: No evidence of acute fracture or dislocation of the right forearm. Degenerative changes in the right wrist. SOFT TISSUES: Mild soft tissue swelling over the dorsum of the forearm. IMPRESSION: 1. Mild soft tissue swelling over the dorsum of the forearm. 2. No acute fracture or dislocation of the right forearm. Electronically signed by: Elsie Gravely MD 07/25/2024 02:44 PM EST RP Workstation: [id number]   DG Chest Port 1 View Result Date: 07/25/2024 EXAM: 1 VIEW(S) XRAY OF THE CHEST 07/25/2024 02:28:02 PM COMPARISON: CT chest abdomen and pelvis 07/25/2024. CLINICAL HISTORY: Trauma. FINDINGS: LUNGS AND PLEURA: Shallow inspiration. Infiltration or atelectasis in the right lung base. No pleural effusion. No pneumothorax. HEART AND MEDIASTINUM: Mediastinal contours appear intact. BONES AND SOFT TISSUES: Degenerative changes in the spine and shoulders. IMPRESSION: 1. Infiltration or atelectasis in the right lung base. 2. No pleural effusion or pneumothorax. Electronically signed by: Elsie Gravely MD 07/25/2024 02:43 PM EST RP Workstation: [id number]   CT Head Wo Contrast Result Date: 07/25/2024 EXAM: CT HEAD WITH INTRAVENOUS CONTRAST 07/25/2024 01:54:56 PM TECHNIQUE: CT of the head was performed with the administration of 75 mL of iohexol  (OMNIPAQUE ) 350 MG/ML injection. Automated exposure control, iterative reconstruction, and/or weight based adjustment of the mA/kV was utilized to reduce the radiation dose to as low as reasonably achievable. COMPARISON: None available. CLINICAL HISTORY: Mental status change, unknown cause; Polytrauma, blunt.  FINDINGS: BRAIN AND VENTRICLES: No acute intracranial hemorrhage. No mass effect or midline shift. No extra-axial fluid collection. No evidence of acute infarct. No hydrocephalus. Cavum septum pellucidum and vergae. Hypoattenuating foci in the cerebral white matter, most likely representing chronic small vessel disease. ORBITS: A right lateral periorbital hematoma is identified which has a thickness  of 1 cm, image 16/3. SINUSES AND MASTOIDS: Mild mucosal thickening involving the ethmoid air cells, right maxillary sinus and sphenoid sinus. SOFT TISSUES AND SKULL: No acute skull fracture. A right lateral periorbital hematoma is identified which has a thickness of 1 cm, image 16/3. IMPRESSION: 1. No acute intracranial abnormality. 2. Right lateral periorbital hematoma measuring 1 cm in thickness. 3. Mild paranasal sinus mucosal thickening. Electronically signed by: Waddell Calk MD 07/25/2024 02:14 PM EST RP Workstation: [id number]   CT CHEST ABDOMEN PELVIS W CONTRAST Result Date: 07/25/2024 EXAM: CT CHEST, ABDOMEN AND PELVIS WITH CONTRAST 07/25/2024 01:54:56 PM TECHNIQUE: CT of the chest, abdomen and pelvis was performed with the administration of 75 mL of iohexol  (OMNIPAQUE ) 350 MG/ML injection. Multiplanar reformatted images are provided for review. Automated exposure control, iterative reconstruction, and/or weight based adjustment of the mA/kV was utilized to reduce the radiation dose to as low as reasonably achievable. COMPARISON: None available. CLINICAL HISTORY: Sepsis. FINDINGS: CHEST: Mild scattered calcified plaque in the descending thoracic aorta. MEDIASTINUM AND LYMPH NODES: 3 vessel coronary calcifications. Heart and pericardium are otherwise unremarkable. The central airways are clear. No mediastinal, hilar or axillary lymphadenopathy. LUNGS AND PLEURA: Subsegmental pleural based atelectasis, consolidation or scarring laterally in the right middle lobe. Dependent atelectasis in both lung bases. No  pleural effusion or pneumothorax. ABDOMEN AND PELVIS: LIVER: The liver is unremarkable. GALLBLADDER AND BILE DUCTS: Multiple partially calcified gallstones measuring up to 1 cm in diameter in the gallbladder fundus. No biliary ductal dilatation. SPLEEN: No acute abnormality. PANCREAS: No acute abnormality. ADRENAL GLANDS: No acute abnormality. KIDNEYS, URETERS AND BLADDER: No stones in the kidneys or ureters. No hydronephrosis. No perinephric or periureteral stranding. Urinary bladder is unremarkable. GI AND BOWEL: Stomach demonstrates no acute abnormality. A few scattered diverticula near the descending sigmoid junction without adjacent inflammatory change. There is no bowel obstruction. REPRODUCTIVE ORGANS: No acute abnormality. PERITONEUM AND RETROPERITONEUM: No ascites. No free air. VASCULATURE: Moderate calcified plaque in the abdominal aorta without aneurysm. Aorta is normal in caliber. ABDOMINAL AND PELVIS LYMPH NODES: No lymphadenopathy. REPRODUCTIVE ORGANS: No acute abnormality. BONES AND SOFT TISSUES: Vertebral endplate spurring at multiple levels in the lower thoracic spine. Right shoulder DJD. No focal soft tissue abnormality. IMPRESSION: 1. No acute abnormality of the chest, abdomen, and pelvis related to sepsis. Electronically signed by: Katheleen Faes MD 07/25/2024 02:14 PM EST RP Workstation: [id number]   CT Cervical Spine Wo Contrast Result Date: 07/25/2024 EXAM: CT CERVICAL SPINE WITHOUT CONTRAST 07/25/2024 01:54:56 PM TECHNIQUE: CT of the cervical spine was performed without the administration of intravenous contrast. Multiplanar reformatted images are provided for review. Automated exposure control, iterative reconstruction, and/or weight based adjustment of the mA/kV was utilized to reduce the radiation dose to as low as reasonably achievable. COMPARISON: None available. CLINICAL HISTORY: Neck trauma (Age >= 65y). FINDINGS: BONES AND ALIGNMENT: No acute fracture or traumatic malalignment.  Acquired fusion at C5-C6. 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 SOFT TISSUES: No prevertebral soft tissue swelling. Atherosclerotic calcifications of the carotid bulbs. IMPRESSION: 1. No acute fracture or traumatic malalignment of the cervical spine. 2. 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 Electronically signed by: Ryan Chess MD 07/25/2024 02:13 PM EST RP Workstation: [id number]    Vitals:   07/26/24 0630 07/26/24 0645 07/26/24 0700 07/26/24 0714  BP: (!) 108/55 (!) 108/54 (!) 102/51  Pulse: 77 80 80   Resp: 19 18 18    Temp:    99.2 F (37.3 C)  TempSrc:    Axillary  SpO2: 96% 96% 94%   Weight:      Height:         PHYSICAL EXAM General: Intubated, off sedation. Not following commands. CV: Regular rate and rhythm on monitor Respiratory:  intubated  NEURO:  Mental Status: GCS 5 (1,1,3) patient is intubated and off sedation for a few hours.  Eyes are closed.  She is comatose.  Does not respond to verbal stimuli.  Pupils are equal reactive.  Doll's eye movements are sluggish.. Pain response is flexion to both feet, not to hands. R hand has shown no movement, has proper tone. L hand with mitten because of gentle pulling at lines.   Cranial Nerves: II: PERRL. Right gaze deviation III, IV, VI, V, VII, VIII, IX, X, XI, XII: Unable to assess.  Motor: unable to assess strength, tone is appropriate.  Sensation, coordination, gait: unable to assess  Most Recent NIH 25      ASSESSMENT/PLAN  Ms. LEXI CONATY is a 73 y.o. female with history of diabetes, HTN, HLD admitted for acute encephalopathy.  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 Continuing to search for other causes. Code Stroke CT head: No acute abnormality. ASPECTS 10.    CTA head & neck: No acute large vessel occlusion, motion limited. Severe stenosis of L V1 w/o occlusion. Moderate stenosis of posterior P2 of L PCA.  MRI brain: Smal acute ischemic infarcts of the L posterior frontoparietal region. Chronic microvascular ischemic disease.  MRI C-spine: normal cervical spinal cord.  2D Echo: pending LDL 145 HgbA1c 6.4 VTE prophylaxis - SCDs No antithrombotic prior to admission, now on aspirin  81 mg daily Therapy recommendations:  Pending Disposition:  pending  Seizure R gaze deviation is suspicious for seizure activity given the location of the stroke. Routine EEG: Negative for seizure Continuous video EEG: Pending  Atrial fibrillation - no diagnosis Continue telemetry monitoring  Hypertension Home meds:  Norvasc , Propranolol  Unstable Long-term BP goal: 130/90  Hyperlipidemia Home meds:  Crestor  40mg  daily, resumed in hospital LDL 145, goal < 70 Continue statin at discharge  Diabetes type II Controlled Home meds:  Metformin  HgbA1c 6.4, goal < 7.0 CBGs SSI Recommend close follow-up with PCP for better DM control  Substance Abuse UDS pending  Dysphagia Patient has post-stroke dysphagia, SLP consulted    Diet   Diet NPO time specified  Advance diet as tolerated  Hospital day # 1  The strokes shown on imaging are not sufficient to explain the severity of the patient's symptoms. Recommend continuing to workup other sources of acute encephalopathy, including continuous EEG monitoring for seizure. Neurology will continue to follow.  CHARM Penne Mori, DO, PGY1 Neurology Stroke Team  I have personally obtained history,examined this patient, reviewed notes, independently viewed imaging studies, participated in medical decision making and plan of care.ROS completed by me personally and pertinent positives fully documented  I have made  any additions or clarifications directly to the above note. Agree with note above.  Patient presented with right-sided weakness and altered mental status and MRI scan shows small left frontal infarcts but mental status and exam seems out of proportion to the size of stroke and recommend further evaluation to look for reversible causes-check EEG,  aggressive hydration, treatment for presumed aspiration pneumonia UTI as per primary team.  Continue ventilatory support and extubate as tolerated as per critical care team.  Long discussion with patient's husband at the bedside who has cognitive impairment and most of the conversation was with her daughter and answered questions.  Discussed with Dr.Hattar PCCM This patient is critically ill and at significant risk of neurological worsening, death and care requires constant monitoring of vital signs, hemodynamics,respiratory and cardiac monitoring, extensive review of multiple databases, frequent neurological assessment, discussion with family, other specialists and medical decision making of high complexity.I have made any additions or clarifications directly to the above note.This critical care time does not reflect procedure time, or teaching time or supervisory time of PA/NP/Med Resident etc but could involve care discussion time.  I spent 30 minutes of neurocritical care time  in the care of  this patient.     Eather Popp, MD Medical Director [facility] Pager: [phone] 07/26/2024 2:26 PM    To contact Stroke Continuity provider, please refer to [email or web address]. After hours, contact General Neurology

## 2024-07-26 NOTE — Progress Notes (Signed)
 EEG complete - results pending

## 2024-07-26 NOTE — Plan of Care (Signed)

## 2024-07-26 NOTE — Progress Notes (Signed)
 PHARMACY - PHYSICIAN COMMUNICATION CRITICAL VALUE ALERT - BLOOD CULTURE IDENTIFICATION (BCID)  Carolyn Hunter is an 73 y.o. female who presented to [facility] on 07/25/2024 with a chief complaint of AMS  Assessment:  75 YOF with AMS and found to have new strokes and now with 1 of 1 blood culture growing GPC with BCID detecting strep species - really hard to differentiate given one bottle. Could be contamination or representative of more disseminated infection.   Name of physician (or Provider) Contacted: CCM (Hattar)  Current antibiotics: Rocephin  2g IV every 24 hours  Changes to prescribed antibiotics recommended:  Rocephin  will provide streptococcal coverage. With AMS, would recommend ruling out CNS dissemination - that would require adjustment to q12h. Discussed w/ CCM - TTE already done today, pending read. Planning to repeat blood cultures and follow-up on additional work-up and evaluation.   Results for orders placed or performed during the hospital encounter of 07/25/24  Blood Culture ID Panel (Reflexed) (Collected: 07/25/2024  2:12 PM)  Result Value Ref Range   Enterococcus faecalis NOT DETECTED NOT DETECTED   Enterococcus Faecium NOT DETECTED NOT DETECTED   Listeria monocytogenes NOT DETECTED NOT DETECTED   Staphylococcus species NOT DETECTED NOT DETECTED   Staphylococcus aureus (BCID) NOT DETECTED NOT DETECTED   Staphylococcus epidermidis NOT DETECTED NOT DETECTED   Staphylococcus lugdunensis NOT DETECTED NOT DETECTED   Streptococcus species DETECTED (A) NOT DETECTED   Streptococcus agalactiae NOT DETECTED NOT DETECTED   Streptococcus pneumoniae NOT DETECTED NOT DETECTED   Streptococcus pyogenes NOT DETECTED NOT DETECTED   A.calcoaceticus-baumannii NOT DETECTED NOT DETECTED   Bacteroides fragilis NOT DETECTED NOT DETECTED   Enterobacterales NOT DETECTED NOT DETECTED   Enterobacter cloacae complex NOT DETECTED NOT DETECTED   Escherichia coli NOT DETECTED NOT DETECTED    Klebsiella aerogenes NOT DETECTED NOT DETECTED   Klebsiella oxytoca NOT DETECTED NOT DETECTED   Klebsiella pneumoniae NOT DETECTED NOT DETECTED   Proteus species NOT DETECTED NOT DETECTED   Salmonella species NOT DETECTED NOT DETECTED   Serratia marcescens NOT DETECTED NOT DETECTED   Haemophilus influenzae NOT DETECTED NOT DETECTED   Neisseria meningitidis NOT DETECTED NOT DETECTED   Pseudomonas aeruginosa NOT DETECTED NOT DETECTED   Stenotrophomonas maltophilia NOT DETECTED NOT DETECTED   Candida albicans NOT DETECTED NOT DETECTED   Candida auris NOT DETECTED NOT DETECTED   Candida glabrata NOT DETECTED NOT DETECTED   Candida krusei NOT DETECTED NOT DETECTED   Candida parapsilosis NOT DETECTED NOT DETECTED   Candida tropicalis NOT DETECTED NOT DETECTED   Cryptococcus neoformans/gattii NOT DETECTED NOT DETECTED    Thank you for allowing pharmacy to be a part of this patients care.  Almarie Lunger, PharmD, BCPS, BCIDP Infectious Diseases Clinical Pharmacist 07/26/2024 1:45 PM   **Pharmacist phone directory can now be found on [email or web address] ([email or web address]).  Listed under [facility].

## 2024-07-26 NOTE — Progress Notes (Signed)
 PT Cancellation Note  Patient Details Name: Carolyn Hunter MRN: [id number] DOB: [birth year]   Cancelled Treatment:    Reason Eval/Treat Not Completed: Patient's level of consciousness  Spoke with RN, Pt unable to meaningfully participate with PT evaluation yet. Will attempt again tomorrow if more alert.  Carolyn Hunter, PT, DPT [facility]  Rehabilitation Services Physical Therapist Office: [phone] Website: [email or web address]   Carolyn Hunter 07/26/2024, 4:21 PM

## 2024-07-26 NOTE — Progress Notes (Addendum)
 NAME:  Carolyn Hunter, MRN:  [id number], DOB:  [birth year], LOS: 1 ADMISSION DATE:  07/25/2024, CONSULTATION DATE:  12/15 REFERRING MD:  Dr. Neysa, CHIEF COMPLAINT:  AMS   History of Present Illness:  Patient is a 73 yo F w/ pertinent PMH HTN, HLD, T2DM brought in to [facility] on 12/15 w/ acute encephalopathy.  On 12/15 patient found down on ground. Apparently fell yesterday. Last known well unclear.  Not protecting airway consistently in ER so intubated for airway protection.  Was potentially moving left side less so stroke Cts performed which were neg.  PCCM to admit.  Pertinent  Medical History   Past Medical History:  Diagnosis Date   Anxiety    Cataract    OS   Diabetes mellitus without complication (HCC)    Diabetic retinopathy (HCC)    NPDR OU   Hyperlipidemia    Hypertension    Hypertensive retinopathy    OU   Tremors of nervous system    just when I get in a nervous Situation.     Significant Hospital Events: Including procedures, antibiotic start and stop dates in addition to other pertinent events   12/15 admit MRI brain and MRI C-spine performed overnight showing small acute ischemic infarct in the left frontoparietal region.  Interim History / Subjective:  Today, the patient is still encephalopathic unresponsive, spontaneously moves her left arm and left leg but does not move her right arm or right leg.  Only on small dose fentanyl  unresponsive, opens eyes to stimulation but not alert.  Objective    Blood pressure (!) 102/51, pulse 80, temperature 99.2 F (37.3 C), temperature source Axillary, resp. rate 18, height 5' 3 (1.6 m), weight 71.8 kg, SpO2 94%.    Vent Mode: PRVC FiO2 (%):  [40 %] 40 % Set Rate:  [18 bmp] 18 bmp Vt Set:  [420 mL-480 mL] 420 mL PEEP:  [5 cmH20] 5 cmH20 Plateau Pressure:  [15 cmH20-16 cmH20] 16 cmH20   Intake/Output Summary (Last 24 hours) at 07/26/2024 9193 Last data filed at 07/26/2024 0700 Gross per 24 hour  Intake 3116.3  ml  Output 575 ml  Net 2541.3 ml   Filed Weights   07/25/24 1326 07/26/24 0416  Weight: 64.9 kg 71.8 kg    Examination: General: Ill-appearing female with bruising on her right eye HENT: Pinpoint pupils, equal, ecchymosis around right eye Lungs: Able to spontaneously breathe, ventilator breath sounds bilaterally Cardiovascular: Regular rate and rhythm, normal S1, S2 Abdomen: Soft, nontender Extremities: Warm, well-perfused, no edema Neuro: Opens eyes to stimulation however not alert, does not move right arm or right leg, does not withdraw to pain on the right, withdraws to pain on the left, able to spontaneously move left arm and leg  I reviewed her laboratory workup which is significant for mild hyponatremia hyperglycemia.  Resolved problem list   Assessment and Plan   Neuro Acute left frontotemporal ischemic stroke -Appreciate neurology recommendations - Continue rosuvastatin  40 mg daily - Aspirin  81mg  - Echo, already in process  - Discussed with neurology, size of stroke small compared to significant right-sided deficits.  Right-sided gaze inconsistent with left frontotemporal stroke.  Plan to continue cEEG.  Neurology agrees with baby aspirin .    Pulm Acute hypoxic respiratory failure in the setting of altered mental status and inability to protect airway # Right basilar opacity likely atelectasis versus aspiration pneumonia - Patient able to SBT 8/5 at this time.  However mental status is barrier to extubation - Received  1 dose of cefepime  and vancomycin  in the ER, switched to Zosyn .  Will transition to ceftriaxone  for total of 5 days  Cardiac/Vascular  Echocardiogram already ordered by neurology pending  GI Diet: Start tube feeds GI PPX: Protonix  daily - Continue Colace and senna  ID # Right basilar opacity concerning for aspiration pneumonia # Asymptomatic bacteriuria - Continue ceftriaxone  for 5 days - Blood cultures in process  Renal  No  issues  Endo Hyperglycemia TSH WNL -Increase sliding scale to moderate - Start 5 units Lantus   Heme/Onc DVT ppx : SCD - Will discuss with neurology timing to start chemical DVT prophylaxis   MSK/other  Multiple bruises with right eye ecchymosis CT C-spine with no acute fracture   Labs   CBC: Recent Labs  Lab 07/25/24 1324 07/25/24 1336 07/25/24 1712 07/26/24 0603  WBC 13.5*  --   --  12.8*  NEUTROABS 11.2*  --   --   --   HGB 13.7 13.9  14.3 11.2* 12.3  HCT 39.9 41.0  42.0 33.0* 36.7  MCV 94.1  --   --  96.8  PLT 239  --   --  189    Basic Metabolic Panel: Recent Labs  Lab 07/25/24 1324 07/25/24 1336 07/25/24 1712 07/26/24 0603  NA 137 135  135 133* 134*  K 4.2 4.3  4.3 3.5 3.7  CL 103 102  --  101  CO2 23  --   --  25  GLUCOSE 284* 284*  --  246*  BUN 9 10  --  13  CREATININE 0.87 0.80  --  0.97  CALCIUM  9.0  --   --  8.4*  MG  --   --   --  1.6*  PHOS  --   --   --  2.7   GFR: Estimated Creatinine Clearance: 50.6 mL/min (by C-G formula based on SCr of 0.97 mg/dL). Recent Labs  Lab 07/25/24 1324 07/25/24 1336 07/25/24 1725 07/26/24 0603  WBC 13.5*  --   --  12.8*  LATICACIDVEN  --  4.7* 2.8*  --     Liver Function Tests: Recent Labs  Lab 07/25/24 1324  AST 48*  ALT 19  ALKPHOS 70  BILITOT 1.2  PROT 7.5  ALBUMIN 3.7   No results for input(s): LIPASE, AMYLASE in the last 168 hours. Recent Labs  Lab 07/25/24 1702  AMMONIA 31    ABG    Component Value Date/Time   PHART 7.430 07/25/2024 1712   PCO2ART 34.7 07/25/2024 1712   PO2ART 154 (H) 07/25/2024 1712   HCO3 23.0 07/25/2024 1712   TCO2 24 07/25/2024 1712   ACIDBASEDEF 1.0 07/25/2024 1712   O2SAT 99 07/25/2024 1712     Coagulation Profile: Recent Labs  Lab 07/25/24 1324  INR 1.1    Cardiac Enzymes: Recent Labs  Lab 07/25/24 1324  CKTOTAL 4,216*  CKMB 31.8*    HbA1C: HbA1c, POC (prediabetic range)  Date/Time Value Ref Range Status  01/05/2020 09:29 AM  7.6 (A) 5.7 - 6.4 % Final   HbA1c, POC (controlled diabetic range)  Date/Time Value Ref Range Status  01/05/2020 09:29 AM 7.6 (A) 0.0 - 7.0 % Final   HbA1c POC (<> result, manual entry)  Date/Time Value Ref Range Status  01/05/2020 09:29 AM 7.6 4.0 - 5.6 % Final   Hgb A1c MFr Bld  Date/Time Value Ref Range Status  07/25/2024 05:49 PM 6.4 (H) 4.8 - 5.6 % Final    Comment:    (  NOTE) Diagnosis of Diabetes The following HbA1c ranges recommended by the American Diabetes Association (ADA) may be used as an aid in the diagnosis of diabetes mellitus.  Hemoglobin             Suggested A1C NGSP%              Diagnosis  <5.7                   Non Diabetic  5.7-6.4                Pre-Diabetic  >6.4                   Diabetic  <7.0                   Glycemic control for                       adults with diabetes.    07/30/2021 08:12 AM 6.7 (H) 4.8 - 5.6 % Final    Comment:             Prediabetes: 5.7 - 6.4          Diabetes: >6.4          Glycemic control for adults with diabetes: <7.0     CBG: Recent Labs  Lab 07/25/24 1747 07/25/24 1927 07/25/24 2309 07/26/24 0304 07/26/24 0716  GLUCAP 233* 201* 225* 266* 215*    Review of Systems:   Coma  Past Medical History:  She,  has a past medical history of Anxiety, Cataract, Diabetes mellitus without complication (HCC), Diabetic retinopathy (HCC), Hyperlipidemia, Hypertension, Hypertensive retinopathy, and Tremors of nervous system.   Surgical History:   Past Surgical History:  Procedure Laterality Date   CATARACT EXTRACTION W/PHACO Right 10/18/2018   Procedure: CATARACT EXTRACTION PHACO AND INTRAOCULAR LENS PLACEMENT (IOC);  Surgeon: Harrie Agent, MD;  Location: [facility];  Service: Ophthalmology;  Laterality: Right;  CDE: 8.11   CATARACT EXTRACTION W/PHACO Left 01/17/2019   Procedure: CATARACT EXTRACTION PHACO AND INTRAOCULAR LENS PLACEMENT (IOC);  Surgeon: Harrie Agent, MD;  Location: [facility];  Service: Ophthalmology;   Laterality: Left;  CDE: 6.38   CESAREAN SECTION     1988   COLONOSCOPY WITH PROPOFOL  N/A 05/04/2020   Procedure: COLONOSCOPY WITH PROPOFOL ;  Surgeon: Eartha Angelia Sieving, MD;  Location: [facility];  Service: Gastroenterology;  Laterality: N/A;  830   EYE SURGERY Right    Cataract extraction OD   POLYPECTOMY  05/04/2020   Procedure: POLYPECTOMY;  Surgeon: Eartha Angelia Sieving, MD;  Location: [facility];  Service: Gastroenterology;;     Social History:   reports that she has never smoked. She has never used smokeless tobacco. She reports that she does not drink alcohol  and does not use drugs.   Family History:  Her family history includes Asthma in her father; Breast cancer in her mother; Cancer in her mother; Diabetes in her brother, son, and son.   Allergies Allergies[1]   Home Medications  Prior to Admission medications  Medication Sig Start Date End Date Taking? Authorizing Provider  Aflibercept  (EYLEA  IO) Inject 1 Dose into the eye every 30 (thirty) days. Injectable med given @ retina office q 12 wks.    [provider]  amLODipine  (NORVASC ) 5 MG tablet TAKE 1 TABLET BY MOUTH EVERY DAY 04/17/21   Elnor Fairy HERO, NP  Cholecalciferol (VITAMIN D3) 50 MCG (2000 UT) TABS Take 2,000  Units by mouth daily.    [provider]  Liniments (SALONPAS PAIN RELIEF PATCH EX) Place 1 patch onto the skin daily as needed (pain.).    [provider]  metFORMIN  (GLUCOPHAGE ) 1000 MG tablet TAKE 1 TABLET BY MOUTH EVERY DAY WITH BREAKFAST 07/06/23   Antonetta Rollene BRAVO, MD  propranolol  ER (INDERAL  LA) 60 MG 24 hr capsule TAKE 1 CAPSULE BY MOUTH EVERY DAY 05/17/21   Elnor Fairy HERO, NP  rosuvastatin  (CRESTOR ) 40 MG tablet Take 1 tablet (40 mg total) by mouth daily. 08/29/21   Paseda, Folashade R, FNP    The patient is critically ill due to acute hypoxic respiratory failure due to acute stroke.  Critical care was necessary to treat or prevent imminent or life-threatening  deterioration. Critical care time was spent by me on the following activities: development of a treatment plan with the patient and/or surrogate as well as nursing, discussions with consultants, evaluation of the patient's response to treatment, examination of the patient, obtaining a history from the patient or surrogate, ordering and performing treatments and interventions, ordering and review of laboratory studies, ordering and review of radiographic studies, review of telemetry data including pulse oximetry, re-evaluation of patient's condition and participation in multidisciplinary rounds.   I personally spent 43 minutes providing critical care not including any separately billable procedures.   Zola LOISE Herter, MD [facility] 07/26/2024 8:29 AM       [1]  Allergies Allergen Reactions   Ace Inhibitors Cough   Zetia  [Ezetimibe ] Swelling    Sore throat

## 2024-07-26 NOTE — TOC CAGE-AID Note (Signed)
 Transition of Care [facility]) - CAGE-AID Screening   Patient Details  Name: Carolyn Hunter MRN: [id number] Date of Birth: 1951/03/13  [facility] CM/SW Contact:    Sandhya Denherder E Yuridia Couts, LCSW Phone Number: 07/26/2024, 2:51 PM   Clinical Narrative: Patient currently intubated.   CAGE-AID Screening: Substance Abuse Screening unable to be completed due to: : Patient unable to participate

## 2024-07-26 NOTE — Progress Notes (Signed)
 LTM EEG hooked up and running - no initial skin breakdown - push button tested - [facility] monitoring.

## 2024-07-27 ENCOUNTER — Inpatient Hospital Stay (HOSPITAL_COMMUNITY)

## 2024-07-27 DIAGNOSIS — I6350 Cerebral infarction due to unspecified occlusion or stenosis of unspecified cerebral artery: Secondary | ICD-10-CM | POA: Diagnosis not present

## 2024-07-27 DIAGNOSIS — G9340 Encephalopathy, unspecified: Secondary | ICD-10-CM

## 2024-07-27 DIAGNOSIS — R29725 NIHSS score 25: Secondary | ICD-10-CM | POA: Diagnosis not present

## 2024-07-27 DIAGNOSIS — R569 Unspecified convulsions: Secondary | ICD-10-CM | POA: Diagnosis not present

## 2024-07-27 DIAGNOSIS — E1151 Type 2 diabetes mellitus with diabetic peripheral angiopathy without gangrene: Secondary | ICD-10-CM | POA: Diagnosis not present

## 2024-07-27 LAB — CULTURE, BLOOD (SINGLE)

## 2024-07-27 LAB — BASIC METABOLIC PANEL WITH GFR
Anion gap: 7 (ref 5–15)
BUN: 15 mg/dL (ref 8–23)
CO2: 22 mmol/L (ref 22–32)
Calcium: 8.8 mg/dL — ABNORMAL LOW (ref 8.9–10.3)
Chloride: 104 mmol/L (ref 98–111)
Creatinine, Ser: 0.88 mg/dL (ref 0.44–1.00)
GFR, Estimated: >60 mL/min (ref >60)
Glucose, Bld: 148 mg/dL — ABNORMAL HIGH (ref 70–99)
Potassium: 4.3 mmol/L (ref 3.5–5.1)
Sodium: 134 mmol/L — ABNORMAL LOW (ref 135–145)

## 2024-07-27 LAB — GLUCOSE, CAPILLARY
Glucose-Capillary: 148 mg/dL — ABNORMAL HIGH (ref 70–99)
Glucose-Capillary: 167 mg/dL — ABNORMAL HIGH (ref 70–99)
Glucose-Capillary: 157 mg/dL — ABNORMAL HIGH (ref 70–99)
Glucose-Capillary: 180 mg/dL — ABNORMAL HIGH (ref 70–99)
Glucose-Capillary: 110 mg/dL — ABNORMAL HIGH (ref 70–99)
Glucose-Capillary: 155 mg/dL — ABNORMAL HIGH (ref 70–99)

## 2024-07-27 LAB — MAGNESIUM: Magnesium: 2.5 mg/dL — ABNORMAL HIGH (ref 1.7–2.4)

## 2024-07-27 LAB — CBC
HCT: 32 % — ABNORMAL LOW (ref 36.0–46.0)
Hemoglobin: 11 g/dL — ABNORMAL LOW (ref 12.0–15.0)
MCH: 32.8 pg (ref 26.0–34.0)
MCHC: 34.4 g/dL (ref 30.0–36.0)
MCV: 95.5 fL (ref 80.0–100.0)
Platelets: 168 K/uL (ref 150–400)
RBC: 3.35 MIL/uL — ABNORMAL LOW (ref 3.87–5.11)
RDW: 12.1 % (ref 11.5–15.5)
WBC: 11.4 K/uL — ABNORMAL HIGH (ref 4.0–10.5)
nRBC: 0 % (ref 0.0–0.2)

## 2024-07-27 LAB — PHOSPHORUS: Phosphorus: 2 mg/dL — ABNORMAL LOW (ref 2.5–4.6)

## 2024-07-27 MED ORDER — CHLORHEXIDINE GLUCONATE CLOTH 2 % EX PADS
6.0000 | MEDICATED_PAD | CUTANEOUS | Status: DC
  Administered 2024-07-27 – 2024-08-02 (×7): 6 via TOPICAL

## 2024-07-27 MED ORDER — LACTATED RINGERS IV BOLUS
500.0000 mL | Freq: Once | INTRAVENOUS | Status: AC
  Administered 2024-07-27: 16:00:00 500 mL via INTRAVENOUS

## 2024-07-27 MED ORDER — INSULIN GLARGINE 100 UNIT/ML ~~LOC~~ SOLN
8.0000 [IU] | Freq: Every day | SUBCUTANEOUS | Status: DC
  Administered 2024-07-28: 10:00:00 8 [IU] via SUBCUTANEOUS
  Filled 2024-07-27 (×2): qty 0.08

## 2024-07-27 MED ORDER — K PHOS MONO-SOD PHOS DI & MONO 155-852-130 MG PO TABS
500.0000 mg | ORAL_TABLET | ORAL | Status: AC
  Administered 2024-07-27 (×3): 500 mg
  Filled 2024-07-27 (×3): qty 2

## 2024-07-27 MED ORDER — ENOXAPARIN SODIUM 40 MG/0.4ML IJ SOSY
40.0000 mg | PREFILLED_SYRINGE | INTRAMUSCULAR | Status: DC
  Administered 2024-07-27 – 2024-08-02 (×7): 40 mg via SUBCUTANEOUS
  Filled 2024-07-27 (×7): qty 0.4

## 2024-07-27 MED ORDER — LEVETIRACETAM 500 MG PO TABS
500.0000 mg | ORAL_TABLET | Freq: Two times a day (BID) | ORAL | Status: DC
  Administered 2024-07-27 – 2024-08-02 (×13): 500 mg
  Filled 2024-07-27 (×13): qty 1

## 2024-07-27 NOTE — Progress Notes (Signed)
 [facility] ADULT ICU REPLACEMENT PROTOCOL   The patient does apply for the [facility] Adult ICU Electrolyte Replacment Protocol based on the criteria listed below:   1.Exclusion criteria: TCTS, ECMO, Dialysis, and Myasthenia Gravis patients 2. Is GFR >/= 30 ml/min? Yes.    Patient's GFR today is >60 3. Is SCr </= 2? Yes.   Patient's SCr is 0.88 mg/dL 4. Did SCr increase >/= 0.5 in 24 hours? No. 5.Pt's weight >40kg  Yes.   6. Abnormal electrolyte(s): Phos 2.0  7. Electrolytes replaced per protocol 8.  Call MD STAT for K+ </= 2.5, Phos </= 1, or Mag </= 1 Physician:  Epimenio Ole [facility] 07/27/2024 4:26 AM

## 2024-07-27 NOTE — Progress Notes (Addendum)
 STROKE TEAM PROGRESS NOTE    SIGNIFICANT HOSPITAL EVENTS 12/15: Admitted  INTERIM HISTORY/SUBJECTIVE  Pt's husband and son were at bedside with daughter on the phone. Pt was not on sedation during exam, was intubated, and was following some commands. There was spontaneous movement in both legs, R less than L, and no movement in R arm. L arm movement was purposeful. EEG showed epileptogenicity from the centro-temporo-parietal region, particularly during sleep. Patient was loaded with Keppra  yesterday and will start maintenance dose of 5 mg twice daily today.  Patient is on continuous EEG monitoring OBJECTIVE  CBC    Component Value Date/Time   WBC 11.4 (H) 07/27/2024 0302   RBC 3.35 (L) 07/27/2024 0302   HGB 11.0 (L) 07/27/2024 0302   HGB 13.1 07/30/2021 0812   HCT 32.0 (L) 07/27/2024 0302   HCT 37.3 07/30/2021 0812   PLT 168 07/27/2024 0302   PLT 218 07/30/2021 0812   MCV 95.5 07/27/2024 0302   MCV 90 07/30/2021 0812   MCH 32.8 07/27/2024 0302   MCHC 34.4 07/27/2024 0302   RDW 12.1 07/27/2024 0302   RDW 11.2 (L) 07/30/2021 0812   LYMPHSABS 0.7 07/25/2024 1324   LYMPHSABS 1.8 07/30/2021 0812   MONOABS 1.5 (H) 07/25/2024 1324   EOSABS 0.0 07/25/2024 1324   EOSABS 0.3 07/30/2021 0812   BASOSABS 0.0 07/25/2024 1324   BASOSABS 0.0 07/30/2021 0812    BMET    Component Value Date/Time   NA 134 (L) 07/27/2024 0302   NA 141 07/30/2021 0812   K 4.3 07/27/2024 0302   CL 104 07/27/2024 0302   CO2 22 07/27/2024 0302   GLUCOSE 148 (H) 07/27/2024 0302   BUN 15 07/27/2024 0302   BUN 10 07/30/2021 0812   CREATININE 0.88 07/27/2024 0302   CREATININE 0.78 01/10/2020 0803   CALCIUM  8.8 (L) 07/27/2024 0302   EGFR 75 07/30/2021 0812   GFRNONAA >60 07/27/2024 0302   GFRNONAA 79 01/10/2020 0803    IMAGING past 24 hours Overnight EEG with video Result Date: 07/27/2024 Shelton Arlin KIDD, MD     07/27/2024  9:32 AM Patient Name: Carolyn Hunter MRN: [id number] Epilepsy Attending:  Arlin KIDD Shelton Referring Physician/Provider: Zaida Zola SAILOR, MD Duration: 07/26/2024 1536 to 07/27/2024 0930 Patient history: 73 y.o. female found down with right-sided weakness. EEG to evaluate for seizure Level of alertness: Awake, asleep AEDs during EEG study: None Technical aspects: This EEG study was done with scalp electrodes positioned according to the 10-20 International system of electrode placement. Electrical activity was reviewed with band pass filter of 1-70Hz , sensitivity of 7 uV/mm, display speed of 5mm/sec with a 60Hz  notched filter applied as appropriate. EEG data were recorded continuously and digitally stored.  Video monitoring was available and reviewed as appropriate. Description: The posterior dominant rhythm consists of 8 Hz activity of moderate voltage (25-35 uV) seen predominantly in posterior head regions, symmetric and reactive to eye opening and eye closing. Sleep was characterized by vertex waves, sleep spindles (12 to 14 Hz), maximal frontocentral region. There is continuous generalized and lateralized left hemisphere 3 to 6 Hz theta-delta slowing. Sharp waves were noted in left centro-temporo-parietal region, predominantly during sleep. Hyperventilation and photic stimulation were not performed.   ABNORMALITY - Sharp wave, left centro-temporo-parietal region - Continuous slow, generalized and lateralized left hemisphere IMPRESSION: This study showed evidence of epileptogenicity arising from left centro-temporo-parietal region. Additionally there is cortical dysfunction arising from left hemisphere likely secondary to underlying structural abnormality. Lastly there is moderate  diffuse encephalopathy. No seizures were seen throughout the recording. Arlin MALVA Krebs   ECHOCARDIOGRAM COMPLETE Result Date: 07/26/2024    ECHOCARDIOGRAM REPORT   Patient Name:   Carolyn Hunter Date of Exam: 07/26/2024 Medical Rec #:  [id number]      Height:       63.0 in Accession #:    [id number]      Weight:       158.3 lb Date of Birth:  11-08-1950      BSA:          1.751 m Patient Age:    73 years       BP:           100/50 mmHg Patient Gender: F              HR:           88 bpm. Exam Location:  Inpatient Procedure: 2D Echo, Cardiac Doppler, Color Doppler and Intracardiac            Opacification Agent (Both Spectral and Color Flow Doppler were            utilized during procedure). Indications:    Stroke  History:        Patient has no prior history of Echocardiogram examinations.                 Arrythmias:Tachycardia; Risk Factors:Dyslipidemia, Hypertension                 and Diabetes.  Sonographer:    Sherlean Dubin Referring Phys: [phone] MCNEILL P KIRKPATRICK  Sonographer Comments: Echo performed with patient supine and on artificial respirator. Image acquisition challenging due to respiratory motion. IMPRESSIONS  1. Left ventricular ejection fraction, by estimation, is 60 to 65%. The left ventricle has normal function. The left ventricle has no regional wall motion abnormalities. Left ventricular diastolic parameters were normal.  2. Right ventricular systolic function is normal. The right ventricular size is normal.  3. The mitral valve is normal in structure. No evidence of mitral valve regurgitation. No evidence of mitral stenosis.  4. The aortic valve is normal in structure. Aortic valve regurgitation is not visualized. No aortic stenosis is present.  5. The inferior vena cava is normal in size with greater than 50% respiratory variability, suggesting right atrial pressure of 3 mmHg. Conclusion(s)/Recommendation(s): No intracardiac source of embolism detected on this transthoracic study. Consider a transesophageal echocardiogram to exclude cardiac source of embolism if clinically indicated. Images were technically difficult. FINDINGS  Left Ventricle: There is no left ventricular thrombus. Definity  contrast was used. Left ventricular ejection fraction, by estimation, is 60 to 65%. The left ventricle  has normal function. The left ventricle has no regional wall motion abnormalities. Definity  contrast agent was given IV to delineate the left ventricular endocardial borders. The left ventricular internal cavity size was normal in size. There is no left ventricular hypertrophy. Left ventricular diastolic parameters were normal. Right Ventricle: The right ventricular size is normal. No increase in right ventricular wall thickness. Right ventricular systolic function is normal. Left Atrium: Left atrial size was not well visualized. Right Atrium: Right atrial size was not well visualized. Pericardium: There is no evidence of pericardial effusion. Mitral Valve: The mitral valve is normal in structure. No evidence of mitral valve regurgitation. No evidence of mitral valve stenosis. Tricuspid Valve: The tricuspid valve is normal in structure. Tricuspid valve regurgitation is not demonstrated. No evidence of tricuspid stenosis. Aortic Valve: The aortic valve is  normal in structure. Aortic valve regurgitation is not visualized. No aortic stenosis is present. Aortic valve mean gradient measures 3.0 mmHg. Aortic valve peak gradient measures 5.2 mmHg. Aortic valve area, by VTI measures 2.68 cm. Pulmonic Valve: The pulmonic valve was normal in structure. Pulmonic valve regurgitation is not visualized. No evidence of pulmonic stenosis. Aorta: The aortic root is normal in size and structure. Venous: The inferior vena cava is normal in size with greater than 50% respiratory variability, suggesting right atrial pressure of 3 mmHg. IAS/Shunts: The interatrial septum was not well visualized.  LEFT VENTRICLE PLAX 2D LVIDd:         3.90 cm   Diastology LVIDs:         2.80 cm   LV e' medial:    4.58 cm/s LV PW:         1.00 cm   LV E/e' medial:  17.3 LV IVS:        1.00 cm   LV e' lateral:   7.58 cm/s LVOT diam:     2.00 cm   LV E/e' lateral: 10.4 LV SV:         51 LV SV Index:   29 LVOT Area:     3.14 cm  RIGHT VENTRICLE             IVC RV S prime:     6.87 cm/s  IVC diam: 1.30 cm TAPSE (M-mode): 1.1 cm LEFT ATRIUM             Index        RIGHT ATRIUM           Index LA diam:        3.20 cm 1.83 cm/m   RA Area:     11.10 cm LA Vol (A2C):   53.8 ml 30.73 ml/m  RA Volume:   21.60 ml  12.34 ml/m LA Vol (A4C):   24.3 ml 13.88 ml/m LA Biplane Vol: 39.4 ml 22.51 ml/m  AORTIC VALVE AV Area (Vmax):    2.73 cm AV Area (Vmean):   2.62 cm AV Area (VTI):     2.68 cm AV Vmax:           114.50 cm/s AV Vmean:          75.450 cm/s AV VTI:            0.189 m AV Peak Grad:      5.2 mmHg AV Mean Grad:      3.0 mmHg LVOT Vmax:         99.50 cm/s LVOT Vmean:        62.850 cm/s LVOT VTI:          0.161 m LVOT/AV VTI ratio: 0.85  AORTA Ao Root diam: 2.60 cm MITRAL VALVE MV Area (PHT): 4.06 cm    SHUNTS MV Decel Time: 187 msec    Systemic VTI:  0.16 m MV E velocity: 79.10 cm/s  Systemic Diam: 2.00 cm MV A velocity: 76.50 cm/s MV E/A ratio:  1.03 Mihai Croitoru MD Electronically signed by Jerel Balding MD Signature Date/Time: 07/26/2024/3:56:36 PM    Final     Vitals:   07/27/24 1030 07/27/24 1100 07/27/24 1130 07/27/24 1200  BP: (!) 104/52 (!) 110/58 (!) 117/59 (!) 133/59  Pulse: 86 82 85 (!) 102  Resp: 18 18 18  (!) 30  Temp: 99.7 F (37.6 C) 99.5 F (37.5 C) 99.5 F (37.5 C) 99.7 F (37.6 C)  TempSrc:  Bladder  SpO2: 98% 98% 98% 96%  Weight:      Height:         PHYSICAL EXAM General: Intubated, off sedation. Not following commands. CV: Regular rate and rhythm on monitor Respiratory:  intubated  NEURO:  Mental Status: GCS 9? (2,1?,6) (Difficult to assess verbal response given intubated status) patient is intubated and off sedation.  Eyes are closed.  She is comatose. Pupils are equal reactive. She follows some commands.   Cranial Nerves: II: PERRL. Right gaze deviation III, IV, VI, V, VII, VIII, IX, X, XI, XII: Unable to assess.  Motor: Moves legs spontaneously. Moves L arm spontaneously and purposefully. Does not move R  arm. Moves R leg less than L leg. Sensation, coordination, gait: unable to assess      ASSESSMENT/PLAN  Ms. EILA RUNYAN is a 73 y.o. female with history of diabetes, HTN, HLD admitted for acute encephalopathy.  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 ASPECTS 10.    CTA head & neck: No acute large vessel occlusion, motion limited. Severe stenosis of L V1 w/o occlusion. Moderate stenosis of posterior P2 of L PCA.  MRI brain: Smal acute ischemic infarcts of the L posterior frontoparietal region. Chronic microvascular ischemic disease.  MRI C-spine: normal cervical spinal cord.  2D Echo: 60-65%, atria normal in size LDL 145 HgbA1c 6.4 VTE prophylaxis - SCDs No antithrombotic prior to admission, now on aspirin  81 mg daily Therapy recommendations:  Pending Disposition:  pending  Seizure R gaze deviation is suspicious for seizure activity given the location of the stroke. Routine EEG: Negative for seizure Continuous video EEG: epileptogenicity arising from L centro-temporo-parietal region. Keppra  500mg  BID  Atrial fibrillation - no diagnosis Continue telemetry monitoring  Hypertension Home meds:  Norvasc , Propranolol  Unstable Long-term BP goal: 130/90  Hyperlipidemia Home meds:  Crestor  40mg  daily, resumed in hospital LDL 145, goal < 70 Continue statin at discharge  Diabetes type II Controlled Home meds:  Metformin  HgbA1c 6.4, goal < 7.0 CBGs SSI Recommend close follow-up with PCP for better DM control  Substance Abuse UDS pending  Dysphagia Patient has post-stroke dysphagia, SLP consulted    Diet   Diet NPO time specified  Advance diet as tolerated  Hospital day # 2  The strokes shown on imaging are not sufficient to  explain the severity of the patient's symptoms. With + EEG findings for seizure, may be that small stroke led to seizures which led to aspiration and pneumonia giving the encephalopathic picture (possible mixed with a post-ictal component) on top of the focal neurologic findings.  CHARM Penne Mori, DO, PGY1 Neurology Stroke Team 07/27/2024 12:31 PM  I have personally obtained history,examined this patient, reviewed notes, independently viewed imaging studies, participated in medical decision making and plan of care.ROS completed by me personally and pertinent positives fully documented  I have made any additions or clarifications directly to the above note. Agree with note above.  Patient mental status seem out of proportion to the small size of the infarct and EEG does show epileptogenicity on the left hemisphere hence continue Keppra  500 mg twice daily and keep patient on overnight long-term EEG monitoring.  Long discussion with patient and her son and wife at the bedside and daughter over the phone and answered questions.  Discussed with critical care team. This patient is critically ill and at significant risk of neurological worsening, death and care requires constant monitoring of vital signs, hemodynamics,respiratory and cardiac monitoring, extensive review of multiple databases, frequent neurological assessment, discussion with family, other specialists and medical decision making of high complexity.I have made any additions or clarifications directly to the above note.This critical care time does not reflect procedure time, or teaching time or supervisory time of PA/NP/Med Resident etc but could involve care discussion time.  I spent 30 minutes of neurocritical care time  in the care of  this patient.     Eather Popp, MD Medical Director [facility] Pager: [phone] 07/27/2024 12:50 PM   To contact Stroke Continuity provider, please refer to [email or web address]. After hours,  contact General Neurology

## 2024-07-27 NOTE — Progress Notes (Signed)
 vLTM maintenance  All impedances below 10k  No skin breakdown noted at  FP1  FP2  FZ CZ

## 2024-07-27 NOTE — Progress Notes (Addendum)
 NAME:  Carolyn Hunter, MRN:  [id number], DOB:  Sep 03, 1950, LOS: 2 ADMISSION DATE:  07/25/2024, CONSULTATION DATE:  12/15 REFERRING MD:  Dr. Neysa, CHIEF COMPLAINT:  AMS   History of Present Illness:  Patient is a 73 yo F w/ pertinent PMH HTN, HLD, T2DM brought in to [facility] on 12/15 w/ acute encephalopathy.  On 12/15 patient found down on ground. Apparently fell yesterday. Last known well unclear.  Not protecting airway consistently in ER so intubated for airway protection.  Was potentially moving left side less so stroke Cts performed which were neg.  PCCM to admit.  Pertinent  Medical History   Past Medical History:  Diagnosis Date   Anxiety    Cataract    OS   Diabetes mellitus without complication (HCC)    Diabetic retinopathy (HCC)    NPDR OU   Hyperlipidemia    Hypertension    Hypertensive retinopathy    OU   Tremors of nervous system    just when I get in a nervous Situation.     Significant Hospital Events: Including procedures, antibiotic start and stop dates in addition to other pertinent events   12/15 admit MRI brain and MRI C-spine performed overnight showing small acute ischemic infarct in the left frontoparietal region.  Interim History / Subjective:  Patient remains severely encephalopathic, not alert or responsive.  When off sedation she flails her left arm and left leg but no movement on the right arm or right leg.  Reported to have some movement in her toes on her right leg.  Objective    Blood pressure 115/61, pulse 91, temperature 99.9 F (37.7 C), resp. rate 18, height 5' 3 (1.6 m), weight 71.8 kg, SpO2 98%.    Vent Mode: PRVC FiO2 (%):  [40 %] 40 % Set Rate:  [18 bmp] 18 bmp Vt Set:  [420 mL] 420 mL PEEP:  [5 cmH20] 5 cmH20 Pressure Support:  [8 cmH20] 8 cmH20 Plateau Pressure:  [13 cmH20-14 cmH20] 13 cmH20   Intake/Output Summary (Last 24 hours) at 07/27/2024 9062 Last data filed at 07/27/2024 0800 Gross per 24 hour  Intake 1003.05 ml   Output 350 ml  Net 653.05 ml   Filed Weights   07/25/24 1326 07/26/24 0416 07/27/24 0312  Weight: 64.9 kg 71.8 kg 71.8 kg    Examination: General: Ill-appearing female with a right eye bruise HENT: [place], right eye bruise, not letting me open her eyes today to check her pupils  Lungs: Ventilatory breath sounds  Cardiovascular: RRR, Nl S1, S2  Abdomen: Soft, NT Extremities: Warm, well perfused, no edema  Neuro:  No movement on RU and RL Extrem, moves her left arm and left leg spontaneously and withdraws to pain on the left     Resolved problem list   Assessment and Plan   Neuro Acute left frontotemporal ischemic stroke -Appreciate neurology recommendations - Continue rosuvastatin  40 mg daily - Aspirin  81mg  - Echo with no acute abnormality - EEG overnight with epileptogenicity arising from the left central temporoparietal region. -Loaded with Keppra  yesterday.  Start on maintenance Keppra  today. - Currently only on Fent for sedation/analgesia   Pulm Acute hypoxic respiratory failure in the setting of altered mental status and inability to protect airway # Right basilar opacity likely atelectasis versus aspiration pneumonia - Mentation remains a barrier to extubation. - Continue ceftriaxone  for total of 5 days for suspected aspiration pneumonia.  Cardiac/Vascular  No issues.  Echocardiogram with no acute findings  GI Diet:  Continue tube feeds GI PPX: Continue Protonix  daily - New bowel regimen with Colace and senna  ID # Right basilar opacity concerning for aspiration pneumonia # Asymptomatic bacteriuria - Continue ceftriaxone  for 5 days.  Will need to adjust duration of ceftriaxone  based on repeat blood culture results - Blood cultures yesterday with streptococcal growth.  Repeat blood cultures.  Unclear if this is a contaminant or a real infection.  For now we will continue ceftriaxone .  Pending repeat cultures.  Reassuring that her echo is normal  Renal  No  issues  Endo Hyperglycemia TSH WNL -Continue moderate dose sliding scale -Increase Lantus  to 8 units daily  Heme/Onc DVT ppx : SCD - Start Lovenox  daily for DVT prophylaxis   MSK/other  Multiple bruises with right eye ecchymosis CT C-spine with no acute fracture   Labs   CBC: Recent Labs  Lab 07/25/24 1324 07/25/24 1336 07/25/24 1712 07/26/24 0603 07/27/24 0302  WBC 13.5*  --   --  12.8* 11.4*  NEUTROABS 11.2*  --   --   --   --   HGB 13.7 13.9  14.3 11.2* 12.3 11.0*  HCT 39.9 41.0  42.0 33.0* 36.7 32.0*  MCV 94.1  --   --  96.8 95.5  PLT 239  --   --  189 168    Basic Metabolic Panel: Recent Labs  Lab 07/25/24 1324 07/25/24 1336 07/25/24 1712 07/26/24 0603 07/27/24 0302  NA 137 135  135 133* 134* 134*  K 4.2 4.3  4.3 3.5 3.7 4.3  CL 103 102  --  101 104  CO2 23  --   --  25 22  GLUCOSE 284* 284*  --  246* 148*  BUN 9 10  --  13 15  CREATININE 0.87 0.80  --  0.97 0.88  CALCIUM  9.0  --   --  8.4* 8.8*  MG  --   --   --  1.6* 2.5*  PHOS  --   --   --  2.7 2.0*   GFR: Estimated Creatinine Clearance: 55.7 mL/min (by C-G formula based on SCr of 0.88 mg/dL). Recent Labs  Lab 07/25/24 1324 07/25/24 1336 07/25/24 1725 07/26/24 0603 07/27/24 0302  WBC 13.5*  --   --  12.8* 11.4*  LATICACIDVEN  --  4.7* 2.8*  --   --     Liver Function Tests: Recent Labs  Lab 07/25/24 1324  AST 48*  ALT 19  ALKPHOS 70  BILITOT 1.2  PROT 7.5  ALBUMIN 3.7   No results for input(s): LIPASE, AMYLASE in the last 168 hours. Recent Labs  Lab 07/25/24 1702  AMMONIA 31    ABG    Component Value Date/Time   PHART 7.430 07/25/2024 1712   PCO2ART 34.7 07/25/2024 1712   PO2ART 154 (H) 07/25/2024 1712   HCO3 23.0 07/25/2024 1712   TCO2 24 07/25/2024 1712   ACIDBASEDEF 1.0 07/25/2024 1712   O2SAT 99 07/25/2024 1712     Coagulation Profile: Recent Labs  Lab 07/25/24 1324  INR 1.1    Cardiac Enzymes: Recent Labs  Lab 07/25/24 1324  CKTOTAL  4,216*  CKMB 31.8*    HbA1C: HbA1c, POC (prediabetic range)  Date/Time Value Ref Range Status  01/05/2020 09:29 AM 7.6 (A) 5.7 - 6.4 % Final   HbA1c, POC (controlled diabetic range)  Date/Time Value Ref Range Status  01/05/2020 09:29 AM 7.6 (A) 0.0 - 7.0 % Final   HbA1c POC (<> result, manual entry)  Date/Time  Value Ref Range Status  01/05/2020 09:29 AM 7.6 4.0 - 5.6 % Final   Hgb A1c MFr Bld  Date/Time Value Ref Range Status  07/25/2024 05:49 PM 6.4 (H) 4.8 - 5.6 % Final    Comment:    (NOTE) Diagnosis of Diabetes The following HbA1c ranges recommended by the American Diabetes Association (ADA) may be used as an aid in the diagnosis of diabetes mellitus.  Hemoglobin             Suggested A1C NGSP%              Diagnosis  <5.7                   Non Diabetic  5.7-6.4                Pre-Diabetic  >6.4                   Diabetic  <7.0                   Glycemic control for                       adults with diabetes.    07/30/2021 08:12 AM 6.7 (H) 4.8 - 5.6 % Final    Comment:             Prediabetes: 5.7 - 6.4          Diabetes: >6.4          Glycemic control for adults with diabetes: <7.0     CBG: Recent Labs  Lab 07/26/24 1527 07/26/24 1917 07/26/24 2307 07/27/24 0306 07/27/24 0756  GLUCAP 179* 158* 171* 148* 167*    Review of Systems:   Coma  Past Medical History:  She,  has a past medical history of Anxiety, Cataract, Diabetes mellitus without complication (HCC), Diabetic retinopathy (HCC), Hyperlipidemia, Hypertension, Hypertensive retinopathy, and Tremors of nervous system.   Surgical History:   Past Surgical History:  Procedure Laterality Date   CATARACT EXTRACTION W/PHACO Right 10/18/2018   Procedure: CATARACT EXTRACTION PHACO AND INTRAOCULAR LENS PLACEMENT (IOC);  Surgeon: Harrie Agent, MD;  Location: [facility];  Service: Ophthalmology;  Laterality: Right;  CDE: 8.11   CATARACT EXTRACTION W/PHACO Left 01/17/2019   Procedure: CATARACT EXTRACTION  PHACO AND INTRAOCULAR LENS PLACEMENT (IOC);  Surgeon: Harrie Agent, MD;  Location: [facility];  Service: Ophthalmology;  Laterality: Left;  CDE: 6.38   CESAREAN SECTION     1988   COLONOSCOPY WITH PROPOFOL  N/A 05/04/2020   Procedure: COLONOSCOPY WITH PROPOFOL ;  Surgeon: Eartha Angelia Sieving, MD;  Location: [facility];  Service: Gastroenterology;  Laterality: N/A;  830   EYE SURGERY Right    Cataract extraction OD   POLYPECTOMY  05/04/2020   Procedure: POLYPECTOMY;  Surgeon: Eartha Angelia Sieving, MD;  Location: [facility];  Service: Gastroenterology;;     Social History:   reports that she has never smoked. She has never used smokeless tobacco. She reports that she does not drink alcohol  and does not use drugs.   Family History:  Her family history includes Asthma in her father; Breast cancer in her mother; Cancer in her mother; Diabetes in her brother, son, and son.   Allergies Allergies[1]   Home Medications  Prior to Admission medications  Medication Sig Start Date End Date Taking? Authorizing Provider  Aflibercept  (EYLEA  IO) Inject 1 Dose into the eye every 30 (thirty) days. Injectable med  given @ retina office q 12 wks.    [provider]  amLODipine  (NORVASC ) 5 MG tablet TAKE 1 TABLET BY MOUTH EVERY DAY 04/17/21   Elnor Fairy HERO, NP  Cholecalciferol (VITAMIN D3) 50 MCG (2000 UT) TABS Take 2,000 Units by mouth daily.    [provider]  Liniments (SALONPAS PAIN RELIEF PATCH EX) Place 1 patch onto the skin daily as needed (pain.).    [provider]  metFORMIN  (GLUCOPHAGE ) 1000 MG tablet TAKE 1 TABLET BY MOUTH EVERY DAY WITH BREAKFAST 07/06/23   Antonetta Rollene BRAVO, MD  propranolol  ER (INDERAL  LA) 60 MG 24 hr capsule TAKE 1 CAPSULE BY MOUTH EVERY DAY 05/17/21   Elnor Fairy HERO, NP  rosuvastatin  (CRESTOR ) 40 MG tablet Take 1 tablet (40 mg total) by mouth daily. 08/29/21   Paseda, Folashade R, FNP    The patient is critically ill due to acute hypoxic  respiratory failure requiring mechanical ventilation with frequent titration.  Critical care was necessary to treat or prevent imminent or life-threatening deterioration. Critical care time was spent by me on the following activities: development of a treatment plan with the patient and/or surrogate as well as nursing, discussions with consultants, evaluation of the patient's response to treatment, examination of the patient, obtaining a history from the patient or surrogate, ordering and performing treatments and interventions, ordering and review of laboratory studies, ordering and review of radiographic studies, review of telemetry data including pulse oximetry, re-evaluation of patient's condition and participation in multidisciplinary rounds.   I personally spent 43 minutes providing critical care not including any separately billable procedures.   Zola LOISE Herter, MD [facility] 07/27/2024 9:47 AM    Zola LOISE Herter, MD [facility] 07/27/2024 9:37 AM       [1]  Allergies Allergen Reactions   Ace Inhibitors Cough   Zetia  [Ezetimibe ] Swelling    Sore throat

## 2024-07-27 NOTE — Progress Notes (Signed)
 SLP Cancellation Note  Patient Details Name: Carolyn Hunter MRN: [id number] DOB: 01-17-1951   Cancelled treatment:       Reason Eval/Treat Not Completed: Patient not medically ready   Alishah Schulte, Consuelo Fitch 07/27/2024, 9:44 AM

## 2024-07-27 NOTE — Plan of Care (Signed)
   Problem: Clinical Measurements: Goal: Ability to maintain clinical measurements within normal limits will improve Outcome: Progressing Goal: Will remain free from infection Outcome: Progressing Goal: Diagnostic test results will improve Outcome: Progressing Goal: Respiratory complications will improve Outcome: Progressing   Problem: Nutrition: Goal: Adequate nutrition will be maintained Outcome: Progressing

## 2024-07-27 NOTE — Progress Notes (Signed)
 PT Cancellation Note  Patient Details Name: Carolyn Hunter MRN: [id number] DOB: Feb 04, 1951   Cancelled Treatment:    Reason Eval/Treat Not Completed: Patient's level of consciousness. Discussed with RN who reports pt still not significantly responding or following commands, unable to participate in PT Evaluation. Will check back tomorrow for appropriateness.  Candida Vetter, PT, DPT Acute Rehabilitation Services  Personal: Secure [facility] Rehab Office: [phone]  Carolyn Hunter [place] 07/27/2024, 7:38 AM

## 2024-07-27 NOTE — Progress Notes (Signed)
 OT Cancellation Note  Patient Details Name: Carolyn Hunter MRN: [id number] DOB: [birth year]   Cancelled Treatment:    Reason Eval/Treat Not Completed:  (not ready due to arousal level)  Ely Molt 07/27/2024, 9:43 AM

## 2024-07-27 NOTE — Procedures (Signed)
 Patient Name: Carolyn Hunter  MRN: [id number]  Epilepsy Attending: Arlin MALVA Krebs  Referring Physician/Provider: Zaida Zola SAILOR, MD  Duration: 07/26/2024 1536 to 07/27/2024 1536  Patient history: 73 y.o. female found down with right-sided weakness. EEG to evaluate for seizure  Level of alertness: Awake, asleep  AEDs during EEG study: None  Technical aspects: This EEG study was done with scalp electrodes positioned according to the 10-20 International system of electrode placement. Electrical activity was reviewed with band pass filter of 1-70Hz , sensitivity of 7 uV/mm, display speed of 39mm/sec with a 60Hz  notched filter applied as appropriate. EEG data were recorded continuously and digitally stored.  Video monitoring was available and reviewed as appropriate.  Description: The posterior dominant rhythm consists of 8 Hz activity of moderate voltage (25-35 uV) seen predominantly in posterior head regions, symmetric and reactive to eye opening and eye closing. Sleep was characterized by vertex waves, sleep spindles (12 to 14 Hz), maximal frontocentral region. There is continuous generalized and lateralized left hemisphere 3 to 6 Hz theta-delta slowing. Sharp waves were noted in left centro-temporo-parietal region, predominantly during sleep. Hyperventilation and photic stimulation were not performed.     ABNORMALITY - Sharp wave, left centro-temporo-parietal region - Continuous slow, generalized and lateralized left hemisphere   IMPRESSION: This study showed evidence of epileptogenicity arising from left centro-temporo-parietal region. Additionally there is cortical dysfunction arising from left hemisphere likely secondary to underlying structural abnormality. Lastly there is moderate diffuse encephalopathy. No seizures were seen throughout the recording.  [name] O [name]

## 2024-07-28 ENCOUNTER — Inpatient Hospital Stay (HOSPITAL_COMMUNITY)

## 2024-07-28 DIAGNOSIS — R569 Unspecified convulsions: Secondary | ICD-10-CM | POA: Diagnosis not present

## 2024-07-28 DIAGNOSIS — I6350 Cerebral infarction due to unspecified occlusion or stenosis of unspecified cerebral artery: Secondary | ICD-10-CM | POA: Diagnosis not present

## 2024-07-28 DIAGNOSIS — G9340 Encephalopathy, unspecified: Secondary | ICD-10-CM | POA: Diagnosis not present

## 2024-07-28 DIAGNOSIS — R29725 NIHSS score 25: Secondary | ICD-10-CM | POA: Diagnosis not present

## 2024-07-28 DIAGNOSIS — E1151 Type 2 diabetes mellitus with diabetic peripheral angiopathy without gangrene: Secondary | ICD-10-CM | POA: Diagnosis not present

## 2024-07-28 LAB — BASIC METABOLIC PANEL WITH GFR
Anion gap: 5 (ref 5–15)
BUN: 15 mg/dL (ref 8–23)
CO2: 28 mmol/L (ref 22–32)
Calcium: 8.9 mg/dL (ref 8.9–10.3)
Chloride: 105 mmol/L (ref 98–111)
Creatinine, Ser: 0.75 mg/dL (ref 0.44–1.00)
GFR, Estimated: >60 mL/min (ref >60)
Glucose, Bld: 150 mg/dL — ABNORMAL HIGH (ref 70–99)
Potassium: 4 mmol/L (ref 3.5–5.1)
Sodium: 139 mmol/L (ref 135–145)

## 2024-07-28 LAB — URINE DRUGS OF ABUSE SCREEN W ALC, ROUTINE (REF LAB)
Amphetamines, Urine: NEGATIVE ng/mL
Barbiturate, Ur: NEGATIVE ng/mL
Benzodiazepine Quant, Ur: NEGATIVE ng/mL
Cannabinoid Quant, Ur: NEGATIVE ng/mL
Cocaine (Metab.): NEGATIVE ng/mL
Creatinine, Urine: 141.2 mg/dL (ref 20.0–300.0)
Ethanol U, Quan: NEGATIVE %
Methadone Screen, Urine: NEGATIVE ng/mL
Nitrite Urine, Quantitative: NEGATIVE ug/mL
OPIATE SCREEN URINE: NEGATIVE ng/mL
Phencyclidine, Ur: NEGATIVE ng/mL
Propoxyphene, Urine: NEGATIVE ng/mL
pH, Urine: 5.5 (ref 4.5–8.9)

## 2024-07-28 LAB — CBC
HCT: 29.6 % — ABNORMAL LOW (ref 36.0–46.0)
Hemoglobin: 9.7 g/dL — ABNORMAL LOW (ref 12.0–15.0)
MCH: 31.9 pg (ref 26.0–34.0)
MCHC: 32.8 g/dL (ref 30.0–36.0)
MCV: 97.4 fL (ref 80.0–100.0)
Platelets: 165 K/uL (ref 150–400)
RBC: 3.04 MIL/uL — ABNORMAL LOW (ref 3.87–5.11)
RDW: 12 % (ref 11.5–15.5)
WBC: 8.9 K/uL (ref 4.0–10.5)
nRBC: 0 % (ref 0.0–0.2)

## 2024-07-28 LAB — GLUCOSE, CAPILLARY
Glucose-Capillary: 139 mg/dL — ABNORMAL HIGH (ref 70–99)
Glucose-Capillary: 148 mg/dL — ABNORMAL HIGH (ref 70–99)
Glucose-Capillary: 156 mg/dL — ABNORMAL HIGH (ref 70–99)
Glucose-Capillary: 207 mg/dL — ABNORMAL HIGH (ref 70–99)
Glucose-Capillary: 232 mg/dL — ABNORMAL HIGH (ref 70–99)
Glucose-Capillary: 253 mg/dL — ABNORMAL HIGH (ref 70–99)

## 2024-07-28 LAB — MAGNESIUM: Magnesium: 2.4 mg/dL (ref 1.7–2.4)

## 2024-07-28 LAB — CK: Total CK: 3081 U/L — ABNORMAL HIGH (ref 38–234)

## 2024-07-28 LAB — PHOSPHORUS: Phosphorus: 2.5 mg/dL (ref 2.5–4.6)

## 2024-07-28 MED ORDER — DEXMEDETOMIDINE HCL IN NACL 400 MCG/100ML IV SOLN
0.0000 ug/kg/h | INTRAVENOUS | Status: DC
  Administered 2024-07-28: 19:00:00 0.5 ug/kg/h via INTRAVENOUS
  Administered 2024-07-28: 10:00:00 0.4 ug/kg/h via INTRAVENOUS
  Administered 2024-07-29: 0.7 ug/kg/h via INTRAVENOUS
  Filled 2024-07-28 (×3): qty 100

## 2024-07-28 MED ORDER — ETOMIDATE 2 MG/ML IV SOLN
INTRAVENOUS | Status: AC
  Administered 2024-07-28: 23:00:00 10 mg via INTRAVENOUS
  Filled 2024-07-28: qty 20

## 2024-07-28 MED ORDER — MIDAZOLAM HCL (PF) 2 MG/2ML IJ SOLN: 2.0000 mg | Freq: Once | INTRAMUSCULAR | Status: AC

## 2024-07-28 MED ORDER — ETOMIDATE 2 MG/ML IV SOLN: 10.0000 mg | Freq: Once | INTRAVENOUS | Status: AC

## 2024-07-28 MED ORDER — MIDAZOLAM HCL 2 MG/2ML IJ SOLN
INTRAMUSCULAR | Status: AC
  Administered 2024-07-28: 23:00:00 2 mg via INTRAVENOUS
  Filled 2024-07-28: qty 2

## 2024-07-28 NOTE — Progress Notes (Addendum)
 STROKE TEAM PROGRESS NOTE    SIGNIFICANT HOSPITAL EVENTS 12/15: Admitted  INTERIM HISTORY/SUBJECTIVE  Pt family was at bedside this morning. Pt was not sedated, but is still intubated. Is not following commands. There is spontaneous movement in all 4 limbs, with less movement in R arm and leg compared with L. Otherwise, Neuro exam is unchanged. Discontinued long term EEG as report showed no further seizure-like activity last night. Appears encephalopathic.  OBJECTIVE  CBC    Component Value Date/Time   WBC 8.9 07/28/2024 0530   RBC 3.04 (L) 07/28/2024 0530   HGB 9.7 (L) 07/28/2024 0530   HGB 13.1 07/30/2021 0812   HCT 29.6 (L) 07/28/2024 0530   HCT 37.3 07/30/2021 0812   PLT 165 07/28/2024 0530   PLT 218 07/30/2021 0812   MCV 97.4 07/28/2024 0530   MCV 90 07/30/2021 0812   MCH 31.9 07/28/2024 0530   MCHC 32.8 07/28/2024 0530   RDW 12.0 07/28/2024 0530   RDW 11.2 (L) 07/30/2021 0812   LYMPHSABS 0.7 07/25/2024 1324   LYMPHSABS 1.8 07/30/2021 0812   MONOABS 1.5 (H) 07/25/2024 1324   EOSABS 0.0 07/25/2024 1324   EOSABS 0.3 07/30/2021 0812   BASOSABS 0.0 07/25/2024 1324   BASOSABS 0.0 07/30/2021 0812    BMET    Component Value Date/Time   NA 139 07/28/2024 0530   NA 141 07/30/2021 0812   K 4.0 07/28/2024 0530   CL 105 07/28/2024 0530   CO2 28 07/28/2024 0530   GLUCOSE 150 (H) 07/28/2024 0530   BUN 15 07/28/2024 0530   BUN 10 07/30/2021 0812   CREATININE 0.75 07/28/2024 0530   CREATININE 0.78 01/10/2020 0803   CALCIUM  8.9 07/28/2024 0530   EGFR 75 07/30/2021 0812   GFRNONAA >60 07/28/2024 0530   GFRNONAA 79 01/10/2020 0803    IMAGING past 24 hours No results found.   Vitals:   07/28/24 0730 07/28/24 0800 07/28/24 0830 07/28/24 1008  BP: (!) 140/63 130/65 134/62   Pulse: (!) 103 97 (!) 109   Resp: 11 18 19  (!) 24  Temp: 99.5 F (37.5 C) 99.5 F (37.5 C) (!) 95.7 F (35.4 C)   TempSrc:  Bladder    SpO2: 93% 96% 94%   Weight:      Height:          PHYSICAL EXAM General: Intubated, off sedation. Not following commands. CV: Regular rate and rhythm on monitor Respiratory:  intubated  NEURO:  Mental Status:  patient is intubated and off sedation.  Eyes are closed.  She is comatose. Pupils are equal reactive.    Cranial Nerves: II: PERRL. III, IV, VI, V, VII, VIII, IX, X, XI, XII: Unable to assess.  Motor: Moves bil arms and legs spontaneously, R less than left. Sensation, coordination, gait: unable to assess      ASSESSMENT/PLAN  Ms. LUDELL ZACARIAS is a 73 y.o. female with history of diabetes, HTN, HLD admitted for acute encephalopathy.  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 ASPECTS 10.    CTA head & neck: No acute large vessel occlusion, motion limited. Severe stenosis of L V1 w/o occlusion. Moderate stenosis of posterior P2 of L PCA.  MRI brain: Smal acute ischemic infarcts of the L posterior frontoparietal region. Chronic microvascular ischemic disease.  MRI C-spine: normal cervical spinal cord.  2D Echo: 60-65%, atria normal in size LDL 145 HgbA1c 6.4 VTE prophylaxis - SCDs No antithrombotic prior to admission, now on aspirin  81 mg daily Therapy recommendations:  Pending Disposition:  pending  Seizure R gaze deviation was suspicious for seizure activity given the location of the stroke. Routine EEG: Negative for seizure Continuous video EEG day 1: epileptogenicity arising from L centro-temporo-parietal region. Day 2: no activity, dc'd longterm monitoring. Keppra  500mg  BID  Atrial fibrillation - no diagnosis Continue telemetry monitoring  Hypertension Home meds:  Norvasc , Propranolol  Unstable Long-term BP goal: 130/90  Hyperlipidemia Home meds:  Crestor  40mg   daily, resumed in hospital LDL 145, goal < 70 Continue statin at discharge  Diabetes type II Controlled Home meds:  Metformin  HgbA1c 6.4, goal < 7.0 CBGs SSI Recommend close follow-up with PCP for better DM control  Substance Abuse UDS pending  Dysphagia Patient has post-stroke dysphagia, SLP consulted    Diet   Diet NPO time specified  Advance diet as tolerated  Hospital day # 3  The strokes shown on imaging are not sufficient to explain the severity of the patient's symptoms. With + EEG findings for seizure, may be that small stroke led to seizures which led to aspiration and pneumonia giving the encephalopathic picture (possible mixed with a post-ictal component) on top of the focal neurologic findings.  CHARM Penne Mori, DO, PGY1 Neurology Stroke Team 07/28/2024 10:27 AM  I have personally obtained history,examined this patient, reviewed notes, independently viewed imaging studies, participated in medical decision making and plan of care.ROS completed by me personally and pertinent positives fully documented  I have made any additions or clarifications directly to the above note. Agree with note above.  Patient remains sedated and intubated not following commands likely encephalopathic.  Long-term EEG shows no focal seizures until discontinued.  Continue Keppra  500 mg twice daily.  Extubate as tolerated. Long discussion with patient's family at the bedside multiple questions discussed with critical care team. This patient is critically ill and at significant risk of neurological worsening, death and care requires constant monitoring of vital signs, hemodynamics,respiratory and cardiac monitoring, extensive review of multiple databases, frequent neurological assessment, discussion with family, other specialists and medical decision making of high complexity.I have made any additions or clarifications directly to the above note.This critical care time does not reflect procedure  time, or teaching time or supervisory time of PA/NP/Med Resident etc but could involve care discussion time.  I spent 30 minutes of neurocritical care time  in the care of  this patient.     Eather Popp, MD Medical Director [facility] Pager: [phone] 07/28/2024 1:17 PM   To contact Stroke Continuity provider, please refer to [email or web address]. After hours, contact General Neurology

## 2024-07-28 NOTE — Progress Notes (Signed)
 LTM VIDEO EEG discontinued - no skin breakdown at [facility].

## 2024-07-28 NOTE — Procedures (Signed)
 Bronchoscopy Procedure Note  TORIA MONTE  [id number]  1950/10/20  Date:07/28/2024  Time:11:22 PM   Provider Performing:Sylvie Mifsud Maree   Procedure(s):  Flexible Bronchoscopy [id number])  Indication(s) Hemoptisis Patient had ETT exchange over Bougie and suddenly a large bolus of blood was expectorated.  Consent Unable to obtain consent due to emergent nature of procedure.  Anesthesia Fentanyl  drip and Etomidate  drip   Time Out Verified patient identification, verified procedure, site/side was marked, verified correct patient position, special equipment/implants available, medications/allergies/relevant history reviewed, required imaging and test results available.   Sterile Technique Usual hand hygiene, masks, gowns, and gloves were used   Procedure Description Bronchoscope advanced through endotracheal tube and into airway.  Airways were examined down to subsegmental level with findings noted below.   Following diagnostic evaluation, Therapeutic aspiration performed in Rt main stem and right middle lobe and superior segment of RLL.  Findings:  Large clots of blood in the right side with origin in the superior segment of RLL and possibly from RML, the RLL nad RUL were patent without any endobronchial lesions. Left lung clean except for flow over of blood from right side.  No active bleeding seen on either side.   Complications/Tolerance None; patient tolerated the procedure well. Chest X-ray is needed post procedure.   EBL Minimal   Specimen(s) N/a

## 2024-07-28 NOTE — Procedures (Addendum)
 Patient Name: NORALYN KARIM  MRN: [id number]  Epilepsy Attending: Arlin MALVA Krebs  Referring Physician/Provider: Zaida Zola SAILOR, MD  Duration: 07/27/2024 1536 to 07/28/2024 0859   Patient history: 73 y.o. female found down with right-sided weakness. EEG to evaluate for seizure   Level of alertness: Awake, asleep   AEDs during EEG study: LEV   Technical aspects: This EEG study was done with scalp electrodes positioned according to the 10-20 International system of electrode placement. Electrical activity was reviewed with band pass filter of 1-70Hz , sensitivity of 7 uV/mm, display speed of 25mm/sec with a 60Hz  notched filter applied as appropriate. EEG data were recorded continuously and digitally stored.  Video monitoring was available and reviewed as appropriate.   Description: The posterior dominant rhythm consists of 8 Hz activity of moderate voltage (25-35 uV) seen predominantly in posterior head regions, symmetric and reactive to eye opening and eye closing. Sleep was characterized by vertex waves, sleep spindles (12 to 14 Hz), maximal frontocentral region. There is continuous generalized 3 to 6 Hz theta-delta slowing. Hyperventilation and photic stimulation were not performed.     Of note, study was difficult due to significant artifact after around 07/28/2024 0308.    ABNORMALITY - Continuous slow, generalized    IMPRESSION: This study is suggestive of moderate diffuse encephalopathy. No seizures were seen throughout the recording.   Tobenna Needs O Rayola Everhart

## 2024-07-28 NOTE — Progress Notes (Signed)
 eLink Physician-Brief Progress Note Patient Name: Carolyn Hunter DOB: 23-Jun-1951 MRN: [id number]   Date of Service  07/28/2024  HPI/Events of Note  ET tube cuff blown.  eICU Interventions  PCCM ground crew requested to go and exchange the tube.        Karrigan Messamore U Syanne Looney 07/28/2024, 10:06 PM

## 2024-07-28 NOTE — Progress Notes (Signed)
 PT Cancellation Note  Patient Details Name: Carolyn Hunter MRN: [id number] DOB: [birth year]   Cancelled Treatment:    Reason Eval/Treat Not Completed: Patient's level of consciousness  Spoke with RN, pt still not responding to commands, but is moving limbs spontaneously. Encephalopathic, unable to participate meaningfully for comprehensive PT evaluation. Per dept guidelines, after 3rd attempt today will sign off. Please re-consult once pt capable of participating with therapy evaluation.  Leontine Roads, PT, DPT [facility]  Rehabilitation Services Physical Therapist Office: [phone] Website: [email or web address]  Leontine GORMAN Roads 07/28/2024, 12:33 PM

## 2024-07-28 NOTE — Progress Notes (Signed)
 Patient had her ETT replaced due to an airleak in her cuff that wasn't salvable. During the placement of the new ETT  patient had a moderate amount of Blood  expell through the tube. Her  new ETT was placed and cuff was inflated. CO2 detector showed a positive color change with equal bilateral breath sounds. ETT was secured at 24cm, Patient also had a bronch to see where the source of the bleeding was from. Patient did receive an X-ray for confirmation, was told to pull tube back a half cm , ETT is at 23cm

## 2024-07-28 NOTE — Procedures (Signed)
 Endotracheal Tube Exchange Procedure Note  DAVONNA ERTL  [id number]  1950-09-16  Date:07/28/2024  Time:11:14 PM   Provider Performing:Dason Mosley H Derrill Bagnell    Procedure: Endotracheal Tube Exchange Over Bougie  Indication(s) Low tidal volumes with concern for cuff leak  Consent Unable to obtain consent due to emergent nature of procedure.  Anesthesia Etomidate , Versed , and Fentanyl   Time Out Verified patient identification, verified procedure, site/side was marked, verified correct patient position, special equipment/implants available, medications/allergies/relevant history reviewed, required imaging and test results available.  Sterile Technique Usual hand hygeine, masks, and gloves were used  Pre-procedure: Patient was already intubated with a 7.5 mm endotracheal tube. Pre-oxygenated and adequately sedated. Hemodynamically stable prior to procedure.  Procedure Description: An endotracheal tube exchange was performed using a bougie. The bougie was advanced through the existing ETT to a depth of 30 cm without resistance. The cuff of the existing ETT was deflated and the tube was removed while maintaining bougie position. During removal of old ETT, a moderate amount of frank blood with clot was expelled through the tube. A new 7.5 mm endotracheal tube was then advanced over the bougie, passed through the vocal cords without difficulty, the bougie was removed, and the cuff inflated. Colorimetric CO2 detector was consistent with tracheal placement. Bilateral breath sounds confirmed. The ETT was secured at 24 cm at the lip.   Post-procedure: Immediately following exchange, ventilator tidal volumes did not improve, with concern that blood within the airway was contributing to poor ventilation, an immediate bronchoscopy was performed to evaluate source of bleeding and to clear blood from airway. No obvious tracheal or bronchial injury, perforation, or puncture was identified. Friable mucosa  was noted in right and left middle/lower lobes. Clot was successfully removed from the right middle/lower lobes with improvement in ventilator volumes.  The removed ETT was inspected and found to have partial defect in cuff line, consistent with cuff leak.  Complications/Tolerance None; patient tolerated the procedure well. Chest X-ray is ordered to verify placement.  EBL Minimal  Specimen(s) None  Warren Shade, DNP, AGACNP-BC [facility]  Please see [email or web address] for pager details.  From 7A-7P if no response, please call [phone]. After hours, please call ELink [phone].

## 2024-07-28 NOTE — Progress Notes (Addendum)
 NAME:  Carolyn Hunter, MRN:  [id number], DOB:  [birth year], LOS: 3 ADMISSION DATE:  07/25/2024, CONSULTATION DATE:  12/15 REFERRING MD:  Dr. Neysa, CHIEF COMPLAINT:  AMS   History of Present Illness:  Patient is a 73 yo F w/ pertinent PMH HTN, HLD, T2DM brought in to [facility] on 12/15 w/ acute encephalopathy.  On 12/15 patient found down on ground. Apparently fell yesterday. Last known well unclear.  Not protecting airway consistently in ER so intubated for airway protection.  Was potentially moving left side less so stroke Cts performed which were neg.  PCCM to admit.  Pertinent  Medical History   Past Medical History:  Diagnosis Date   Anxiety    Cataract    OS   Diabetes mellitus without complication (HCC)    Diabetic retinopathy (HCC)    NPDR OU   Hyperlipidemia    Hypertension    Hypertensive retinopathy    OU   Tremors of nervous system    just when I get in a nervous Situation.     Significant Hospital Events: Including procedures, antibiotic start and stop dates in addition to other pertinent events   12/15 admit MRI brain and MRI C-spine performed overnight showing small acute ischemic infarct in the left frontoparietal region.  Interim History / Subjective:  Awake, confused. Does not follow commands. Moving all extremities spontaneously.   Objective    Blood pressure 122/60, pulse 79, temperature 99.3 F (37.4 C), resp. rate 19, height 5' 3 (1.6 m), weight 75.3 kg, SpO2 99%.    Vent Mode: PRVC FiO2 (%):  [40 %] 40 % Set Rate:  [18 bmp] 18 bmp Vt Set:  [420 mL] 420 mL PEEP:  [5 cmH20] 5 cmH20 Plateau Pressure:  [15 cmH20-17 cmH20] 17 cmH20   Intake/Output Summary (Last 24 hours) at 07/28/2024 0659 Last data filed at 07/28/2024 0600 Gross per 24 hour  Intake 2222.65 ml  Output 425 ml  Net 1797.65 ml   Filed Weights   07/26/24 0416 07/27/24 0312 07/28/24 0500  Weight: 71.8 kg 71.8 kg 75.3 kg    Examination: General: Ill-appearing female with a  right eye bruise HENT: [place], right eye bruise, not letting me open her eyes today to check her pupils  Lungs: Ventilatory breath sounds  Cardiovascular: RRR  Abdomen: Soft, NT Extremities: Warm, well perfused, no edema  Neuro:  Awake, does not follow commands, moves all extremities   Resolved problem list   Assessment and Plan   Neuro Acute left frontotemporal ischemic stroke -Appreciate neurology recommendations - Rosuvastatin  40 mg daily - Aspirin  81mg  - Echo with no acute abnormality - EEG 12/17 with epileptogenicity arising from the left central temporoparietal region. EEG 12/18 w/ moderate diffuse encephalopathy.  - Keppra  500 mg BID - Currently only on Fent for sedation/analgesia, soft restraints for agitation  Pulm Acute hypoxic respiratory failure in the setting of altered mental status and inability to protect airway # Right basilar opacity likely atelectasis versus aspiration pneumonia - Mentation remains a barrier to extubation. - Continue ceftriaxone  (day 3/5) for suspected aspiration pneumonia.  Cardiac/Vascular  No issues.  Echocardiogram with no acute findings  GI Diet: Continue tube feeds GI PPX: Continue Protonix  daily - New bowel regimen with Colace and senna  ID # Right basilar opacity concerning for aspiration pneumonia # Asymptomatic bacteriuria - Continue ceftriaxone  for 5 days.  Will need to adjust duration of ceftriaxone  based on repeat blood culture results - Blood cultures 12/15 w/ aerococcus viridans but  12/16 Bcx so far NG  Lines: PIV, ETT, Foley  Renal  No issues  Endo Hyperglycemia TSH WNL -Continue moderate dose sliding scale -Lantus  8 units daily  Heme/Onc Normocytic anemia Hgb 9.7 (10). No signs of bleed. Trend CBC.   DVT ppx : Lovenox  daily for DVT prophylaxis  MSK/other  Multiple bruises with right eye ecchymosis CT C-spine with no acute fracture  Elevated CK Was found down. Trend CK today at 3081 [phone])   Labs    CBC: Recent Labs  Lab 07/25/24 1324 07/25/24 1336 07/25/24 1712 07/26/24 0603 07/27/24 0302 07/28/24 0530  WBC 13.5*  --   --  12.8* 11.4* 8.9  NEUTROABS 11.2*  --   --   --   --   --   HGB 13.7 13.9  14.3 11.2* 12.3 11.0* 9.7*  HCT 39.9 41.0  42.0 33.0* 36.7 32.0* 29.6*  MCV 94.1  --   --  96.8 95.5 97.4  PLT 239  --   --  189 168 165    Basic Metabolic Panel: Recent Labs  Lab 07/25/24 1324 07/25/24 1336 07/25/24 1712 07/26/24 0603 07/27/24 0302 07/28/24 0530  NA 137 135  135 133* 134* 134* 139  K 4.2 4.3  4.3 3.5 3.7 4.3 4.0  CL 103 102  --  101 104 105  CO2 23  --   --  25 22 28   GLUCOSE 284* 284*  --  246* 148* 150*  BUN 9 10  --  13 15 15   CREATININE 0.87 0.80  --  0.97 0.88 0.75  CALCIUM  9.0  --   --  8.4* 8.8* 8.9  MG  --   --   --  1.6* 2.5* 2.4  PHOS  --   --   --  2.7 2.0* 2.5   GFR: Estimated Creatinine Clearance: 62.7 mL/min (by C-G formula based on SCr of 0.75 mg/dL). Recent Labs  Lab 07/25/24 1324 07/25/24 1336 07/25/24 1725 07/26/24 0603 07/27/24 0302 07/28/24 0530  WBC 13.5*  --   --  12.8* 11.4* 8.9  LATICACIDVEN  --  4.7* 2.8*  --   --   --     Liver Function Tests: Recent Labs  Lab 07/25/24 1324  AST 48*  ALT 19  ALKPHOS 70  BILITOT 1.2  PROT 7.5  ALBUMIN 3.7   No results for input(s): LIPASE, AMYLASE in the last 168 hours. Recent Labs  Lab 07/25/24 1702  AMMONIA 31    ABG    Component Value Date/Time   PHART 7.430 07/25/2024 1712   PCO2ART 34.7 07/25/2024 1712   PO2ART 154 (H) 07/25/2024 1712   HCO3 23.0 07/25/2024 1712   TCO2 24 07/25/2024 1712   ACIDBASEDEF 1.0 07/25/2024 1712   O2SAT 99 07/25/2024 1712     Coagulation Profile: Recent Labs  Lab 07/25/24 1324  INR 1.1    Cardiac Enzymes: Recent Labs  Lab 07/25/24 1324 07/28/24 0530  CKTOTAL 4,216* 3,081*  CKMB 31.8*  --     HbA1C: HbA1c, POC (prediabetic range)  Date/Time Value Ref Range Status  01/05/2020 09:29 AM 7.6 (A) 5.7 - 6.4 %  Final   HbA1c, POC (controlled diabetic range)  Date/Time Value Ref Range Status  01/05/2020 09:29 AM 7.6 (A) 0.0 - 7.0 % Final   HbA1c POC (<> result, manual entry)  Date/Time Value Ref Range Status  01/05/2020 09:29 AM 7.6 4.0 - 5.6 % Final   Hgb A1c MFr Bld  Date/Time Value Ref Range Status  07/25/2024 05:49 PM 6.4 (H) 4.8 - 5.6 % Final    Comment:    (NOTE) Diagnosis of Diabetes The following HbA1c ranges recommended by the American Diabetes Association (ADA) may be used as an aid in the diagnosis of diabetes mellitus.  Hemoglobin             Suggested A1C NGSP%              Diagnosis  <5.7                   Non Diabetic  5.7-6.4                Pre-Diabetic  >6.4                   Diabetic  <7.0                   Glycemic control for                       adults with diabetes.    07/30/2021 08:12 AM 6.7 (H) 4.8 - 5.6 % Final    Comment:             Prediabetes: 5.7 - 6.4          Diabetes: >6.4          Glycemic control for adults with diabetes: <7.0     CBG: Recent Labs  Lab 07/27/24 1113 07/27/24 1535 07/27/24 1928 07/27/24 2305 07/28/24 0309  GLUCAP 157* 180* 110* 155* 139*    Review of Systems:   Coma  Past Medical History:  She,  has a past medical history of Anxiety, Cataract, Diabetes mellitus without complication (HCC), Diabetic retinopathy (HCC), Hyperlipidemia, Hypertension, Hypertensive retinopathy, and Tremors of nervous system.   Surgical History:   Past Surgical History:  Procedure Laterality Date   CATARACT EXTRACTION W/PHACO Right 10/18/2018   Procedure: CATARACT EXTRACTION PHACO AND INTRAOCULAR LENS PLACEMENT (IOC);  Surgeon: Harrie Agent, MD;  Location: [facility];  Service: Ophthalmology;  Laterality: Right;  CDE: 8.11   CATARACT EXTRACTION W/PHACO Left 01/17/2019   Procedure: CATARACT EXTRACTION PHACO AND INTRAOCULAR LENS PLACEMENT (IOC);  Surgeon: Harrie Agent, MD;  Location: [facility];  Service: Ophthalmology;  Laterality: Left;   CDE: 6.38   CESAREAN SECTION     1988   COLONOSCOPY WITH PROPOFOL  N/A 05/04/2020   Procedure: COLONOSCOPY WITH PROPOFOL ;  Surgeon: Eartha Angelia Sieving, MD;  Location: [facility];  Service: Gastroenterology;  Laterality: N/A;  830   EYE SURGERY Right    Cataract extraction OD   POLYPECTOMY  05/04/2020   Procedure: POLYPECTOMY;  Surgeon: Eartha Angelia Sieving, MD;  Location: [facility];  Service: Gastroenterology;;     Social History:   reports that she has never smoked. She has never used smokeless tobacco. She reports that she does not drink alcohol  and does not use drugs.   Family History:  Her family history includes Asthma in her father; Breast cancer in her mother; Cancer in her mother; Diabetes in her brother, son, and son.   Allergies Allergies[1]   Home Medications  Prior to Admission medications  Medication Sig Start Date End Date Taking? Authorizing Provider  Aflibercept  (EYLEA  IO) Inject 1 Dose into the eye every 30 (thirty) days. Injectable med given @ retina office q 12 wks.    [provider]  amLODipine  (NORVASC ) 5 MG tablet TAKE 1 TABLET BY MOUTH EVERY DAY 04/17/21  Elnor Fairy HERO, NP  Cholecalciferol (VITAMIN D3) 50 MCG (2000 UT) TABS Take 2,000 Units by mouth daily.    [provider]  Liniments (SALONPAS PAIN RELIEF PATCH EX) Place 1 patch onto the skin daily as needed (pain.).    [provider]  metFORMIN  (GLUCOPHAGE ) 1000 MG tablet TAKE 1 TABLET BY MOUTH EVERY DAY WITH BREAKFAST 07/06/23   Antonetta Rollene BRAVO, MD  propranolol  ER (INDERAL  LA) 60 MG 24 hr capsule TAKE 1 CAPSULE BY MOUTH EVERY DAY 05/17/21   Elnor Fairy HERO, NP  rosuvastatin  (CRESTOR ) 40 MG tablet Take 1 tablet (40 mg total) by mouth daily. 08/29/21   Paseda, Folashade R, FNP    The patient is critically ill due to acute hypoxic respiratory failure requiring mechanical ventilation with frequent titration.  Critical care was necessary to treat or prevent imminent or  life-threatening deterioration. Critical care time was spent by me on the following activities: development of a treatment plan with the patient and/or surrogate as well as nursing, discussions with consultants, evaluation of the patient's response to treatment, examination of the patient, obtaining a history from the patient or surrogate, ordering and performing treatments and interventions, ordering and review of laboratory studies, ordering and review of radiographic studies, review of telemetry data including pulse oximetry, re-evaluation of patient's condition and participation in multidisciplinary rounds.   I personally spent 43 minutes providing critical care not including any separately billable procedures.   Ozell Nearing, MD [facility] 07/28/2024 6:59 AM    Ozell Nearing, MD [facility] 07/28/2024 6:59 AM        [1]  Allergies Allergen Reactions   Ace Inhibitors Cough   Zetia  [Ezetimibe ] Swelling and Other (See Comments)    Sore throat

## 2024-07-28 NOTE — Plan of Care (Signed)
°  Problem: Clinical Measurements: Goal: Ability to maintain clinical measurements within normal limits will improve Outcome: Progressing   Problem: Nutrition: Goal: Adequate nutrition will be maintained Outcome: Progressing   Problem: Pain Managment: Goal: General experience of comfort will improve and/or be controlled Outcome: Progressing   Problem: Education: Goal: Knowledge of General Education information will improve Description: Including pain rating scale, medication(s)/side effects and non-pharmacologic comfort measures Outcome: Not Progressing   Problem: Education: Goal: Knowledge of disease or condition will improve Outcome: Not Progressing Goal: Knowledge of secondary prevention will improve (MUST DOCUMENT ALL) Outcome: Not Progressing Goal: Knowledge of patient specific risk factors will improve (DELETE if not current risk factor) Outcome: Not Progressing   Problem: Ischemic Stroke/TIA Tissue Perfusion: Goal: Complications of ischemic stroke/TIA will be minimized Outcome: Not Progressing

## 2024-07-29 ENCOUNTER — Inpatient Hospital Stay (HOSPITAL_COMMUNITY)

## 2024-07-29 DIAGNOSIS — I639 Cerebral infarction, unspecified: Secondary | ICD-10-CM | POA: Diagnosis not present

## 2024-07-29 DIAGNOSIS — E1165 Type 2 diabetes mellitus with hyperglycemia: Secondary | ICD-10-CM

## 2024-07-29 DIAGNOSIS — I69391 Dysphagia following cerebral infarction: Secondary | ICD-10-CM | POA: Diagnosis not present

## 2024-07-29 DIAGNOSIS — E1151 Type 2 diabetes mellitus with diabetic peripheral angiopathy without gangrene: Secondary | ICD-10-CM | POA: Diagnosis not present

## 2024-07-29 DIAGNOSIS — I63512 Cerebral infarction due to unspecified occlusion or stenosis of left middle cerebral artery: Secondary | ICD-10-CM

## 2024-07-29 DIAGNOSIS — I10 Essential (primary) hypertension: Secondary | ICD-10-CM | POA: Diagnosis not present

## 2024-07-29 DIAGNOSIS — G4089 Other seizures: Secondary | ICD-10-CM

## 2024-07-29 DIAGNOSIS — E785 Hyperlipidemia, unspecified: Secondary | ICD-10-CM | POA: Diagnosis not present

## 2024-07-29 DIAGNOSIS — Z7984 Long term (current) use of oral hypoglycemic drugs: Secondary | ICD-10-CM | POA: Diagnosis not present

## 2024-07-29 DIAGNOSIS — I6350 Cerebral infarction due to unspecified occlusion or stenosis of unspecified cerebral artery: Secondary | ICD-10-CM | POA: Diagnosis not present

## 2024-07-29 LAB — BASIC METABOLIC PANEL WITH GFR
Anion gap: 7 (ref 5–15)
BUN: 15 mg/dL (ref 8–23)
CO2: 28 mmol/L (ref 22–32)
Calcium: 9.1 mg/dL (ref 8.9–10.3)
Chloride: 105 mmol/L (ref 98–111)
Creatinine, Ser: 0.66 mg/dL (ref 0.44–1.00)
GFR, Estimated: >60 mL/min
Glucose, Bld: 209 mg/dL — ABNORMAL HIGH (ref 70–99)
Potassium: 4.3 mmol/L (ref 3.5–5.1)
Sodium: 140 mmol/L (ref 135–145)

## 2024-07-29 LAB — CBC
HCT: 28.6 % — ABNORMAL LOW (ref 36.0–46.0)
Hemoglobin: 9.6 g/dL — ABNORMAL LOW (ref 12.0–15.0)
MCH: 32.3 pg (ref 26.0–34.0)
MCHC: 33.6 g/dL (ref 30.0–36.0)
MCV: 96.3 fL (ref 80.0–100.0)
Platelets: 190 K/uL (ref 150–400)
RBC: 2.97 MIL/uL — ABNORMAL LOW (ref 3.87–5.11)
RDW: 11.9 % (ref 11.5–15.5)
WBC: 7.5 K/uL (ref 4.0–10.5)
nRBC: 0 % (ref 0.0–0.2)

## 2024-07-29 LAB — PHOSPHORUS: Phosphorus: 2.4 mg/dL — ABNORMAL LOW (ref 2.5–4.6)

## 2024-07-29 LAB — GLUCOSE, CAPILLARY
Glucose-Capillary: 121 mg/dL — ABNORMAL HIGH (ref 70–99)
Glucose-Capillary: 135 mg/dL — ABNORMAL HIGH (ref 70–99)
Glucose-Capillary: 145 mg/dL — ABNORMAL HIGH (ref 70–99)
Glucose-Capillary: 170 mg/dL — ABNORMAL HIGH (ref 70–99)
Glucose-Capillary: 204 mg/dL — ABNORMAL HIGH (ref 70–99)
Glucose-Capillary: 216 mg/dL — ABNORMAL HIGH (ref 70–99)

## 2024-07-29 LAB — MAGNESIUM: Magnesium: 2.3 mg/dL (ref 1.7–2.4)

## 2024-07-29 MED ORDER — AMLODIPINE BESYLATE 5 MG PO TABS: 5.0000 mg | ORAL_TABLET | Freq: Every day | ORAL | Status: DC

## 2024-07-29 MED ORDER — INSULIN GLARGINE 100 UNIT/ML ~~LOC~~ SOLN
12.0000 [IU] | Freq: Every day | SUBCUTANEOUS | Status: DC
  Administered 2024-07-29 – 2024-08-02 (×5): 12 [IU] via SUBCUTANEOUS
  Filled 2024-07-29 (×5): qty 0.12

## 2024-07-29 MED ORDER — AMLODIPINE BESYLATE 5 MG PO TABS
10.0000 mg | ORAL_TABLET | Freq: Every day | ORAL | Status: DC
  Administered 2024-07-29 – 2024-08-02 (×5): 10 mg
  Filled 2024-07-29: qty 2
  Filled 2024-07-29: qty 1
  Filled 2024-07-29: qty 2
  Filled 2024-07-29: qty 1
  Filled 2024-07-29: qty 2

## 2024-07-29 NOTE — Procedures (Signed)
 Extubation Procedure Note  Patient Details:   Name: Carolyn Hunter DOB: Dec 13, 1950 MRN: [id number]   Airway Documentation:    Vent end date: 07/29/24 Vent end time: 1201   Evaluation  O2 sats: stable throughout Complications: No apparent complications Patient did tolerate procedure well. Bilateral Breath Sounds: Clear, Diminished   No, pt could not speak post extubation.  Pt extubated successfully to 4 l/m [place].  Carolyn Hunter 07/29/2024, 12:02 PM

## 2024-07-29 NOTE — Plan of Care (Signed)
  Problem: Education: Goal: Knowledge of disease or condition will improve Outcome: Progressing Goal: Knowledge of secondary prevention will improve (MUST DOCUMENT ALL) Outcome: Progressing Goal: Knowledge of patient specific risk factors will improve (DELETE if not current risk factor) Outcome: Progressing   Problem: Ischemic Stroke/TIA Tissue Perfusion: Goal: Complications of ischemic stroke/TIA will be minimized Outcome: Progressing

## 2024-07-29 NOTE — Inpatient Diabetes Management (Signed)
 Inpatient Diabetes Program Recommendations  AACE/ADA: New Consensus Statement on Inpatient Glycemic Control (2015)  Target Ranges:  Prepandial:   less than 140 mg/dL      Peak postprandial:   less than 180 mg/dL (1-2 hours)      Critically ill patients:  140 - 180 mg/dL    Latest Reference Range & Units 07/27/24 23:05 07/28/24 03:09 07/28/24 07:34 07/28/24 11:01 07/28/24 15:33 07/28/24 19:41  Glucose-Capillary 70 - 99 mg/dL 844 (H) 860 (H) 843 (H) 148 (H) 232 (H) 253 (H)  (H): Data is abnormally high  Latest Reference Range & Units 07/28/24 23:32 07/29/24 03:41 07/29/24 08:16  Glucose-Capillary 70 - 99 mg/dL 792 (H) 795 (H) 829 (H)  (H): Data is abnormally high  History: DM2  Home DM Meds: Metformin  1000 mg BID  Current Orders: Lantus  8 units daily      Novolog  Moderate Correction Scale/ SSI (0-15 units) Q4H    TF running 45cc/hr  MD- Note pt having some mild elevations  May consider adding low dose Novolog  Tube feed Coverage: Novolog  3 units Q4H HOLD if tube feeds HELD for any reason    --Will follow patient during hospitalization--  Adina Rudolpho Arrow RN, MSN, CDCES Diabetes Coordinator Inpatient Glycemic Control Team Team Pager: [phone] (8a-5p)

## 2024-07-29 NOTE — Progress Notes (Signed)
 STROKE TEAM PROGRESS NOTE    SIGNIFICANT HOSPITAL EVENTS 12/15: Admitted  INTERIM HISTORY/SUBJECTIVE Patient remains intubated but is not sedated.  She is arousable this morning and follows simple midline and commands in all 4 extremities and moves and purposefully. Plan to extubate later today as per critical care team  OBJECTIVE  CBC    Component Value Date/Time   WBC 7.5 07/29/2024 0427   RBC 2.97 (L) 07/29/2024 0427   HGB 9.6 (L) 07/29/2024 0427   HGB 13.1 07/30/2021 0812   HCT 28.6 (L) 07/29/2024 0427   HCT 37.3 07/30/2021 0812   PLT 190 07/29/2024 0427   PLT 218 07/30/2021 0812   MCV 96.3 07/29/2024 0427   MCV 90 07/30/2021 0812   MCH 32.3 07/29/2024 0427   MCHC 33.6 07/29/2024 0427   RDW 11.9 07/29/2024 0427   RDW 11.2 (L) 07/30/2021 0812   LYMPHSABS 0.7 07/25/2024 1324   LYMPHSABS 1.8 07/30/2021 0812   MONOABS 1.5 (H) 07/25/2024 1324   EOSABS 0.0 07/25/2024 1324   EOSABS 0.3 07/30/2021 0812   BASOSABS 0.0 07/25/2024 1324   BASOSABS 0.0 07/30/2021 0812    BMET    Component Value Date/Time   NA 140 07/29/2024 0427   NA 141 07/30/2021 0812   K 4.3 07/29/2024 0427   CL 105 07/29/2024 0427   CO2 28 07/29/2024 0427   GLUCOSE 209 (H) 07/29/2024 0427   BUN 15 07/29/2024 0427   BUN 10 07/30/2021 0812   CREATININE 0.66 07/29/2024 0427   CREATININE 0.78 01/10/2020 0803   CALCIUM  9.1 07/29/2024 0427   EGFR 75 07/30/2021 0812   GFRNONAA >60 07/29/2024 0427   GFRNONAA 79 01/10/2020 0803    IMAGING past 24 hours DG Abd 1 View Result Date: 07/29/2024 EXAM: 1 VIEW XRAY OF THE ABDOMEN 07/28/2024 11:15:00 PM COMPARISON: 07/25/2024 CLINICAL HISTORY: Encounter for nasogastric (NG) tube placement FINDINGS: LINES, TUBES AND DEVICES: Enteric tube in place with tip and side hole overlying the left upper quadrant, gastric body. BOWEL: Nonobstructive bowel gas pattern. SOFT TISSUES: No abnormal calcifications. BONES: No acute fracture. IMPRESSION: 1. Enteric tube in place  with tip and side hole overlying the gastric body. Electronically signed by: Oneil Devonshire MD 07/29/2024 01:04 AM EST RP Workstation: [id number]   DG CHEST PORT 1 VIEW Result Date: 07/28/2024 EXAM: 1 VIEW(S) XRAY OF THE CHEST 07/28/2024 11:13:00 PM COMPARISON: chest x-ray 07/25/2024 5 CLINICAL HISTORY: Encounter for imaging study to confirm orogastric (OG) tube placement. FINDINGS: LINES, TUBES AND DEVICES: The endotracheal tube tip is at the level of the carina. The enteric tube extends below the diaphragm, but the distal tip is not included on the image. LUNGS AND PLEURA: There is platelike atelectasis in the right lower lobe. There is likely a small right pleural effusion which is new. No pneumothorax. HEART AND MEDIASTINUM: The cardiomediastinal silhouette is stable and within normal limits. BONES AND SOFT TISSUES: No acute osseous abnormality. IMPRESSION: 1. Endotracheal tube tip at the level of the carina; consider retracting. 2. Enteric tube extends below the diaphragm, but the distal tip is not included on the image. 3. Likely small right pleural effusion, new. 4. Platelike atelectasis in the right lower lobe. Electronically signed by: Greig Pique MD 07/28/2024 11:18 PM EST RP Workstation: [id number]     Vitals:   07/29/24 0900 07/29/24 0915 07/29/24 0930 07/29/24 1033  BP: (!) 160/70 (!) 163/68 (!) 160/75 (!) 160/70  Pulse: 63 67 63   Resp: (!) 21 18 (!) 21   Temp:  99.1 F (37.3 C) 99.1 F (37.3 C) 99.1 F (37.3 C)   TempSrc:      SpO2: 100% 100% 100%   Weight:      Height:         PHYSICAL EXAM General: Intubated, off sedation. Not following commands. CV: Regular rate and rhythm on monitor Respiratory:  intubated  NEURO:  Mental Status:  patient is intubated and off sedation.  Eyes are closed.  She is comatose. Pupils are equal reactive.    Cranial Nerves: II: PERRL. III, IV, VI, V, VII, VIII, IX, X, XI, XII: Unable to assess.  Motor: Moves bil arms and legs  spontaneously, R less than left. Sensation, coordination, gait: unable to assess      ASSESSMENT/PLAN  [name] is a 73 y.o. female with history of diabetes, HTN, HLD admitted for acute encephalopathy.  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 ASPECTS 10.    CTA head & neck: No acute large vessel occlusion, motion limited. Severe stenosis of L V1 w/o occlusion. Moderate stenosis of posterior P2 of L PCA.  MRI brain: Smal acute ischemic infarcts of the L posterior frontoparietal region. Chronic microvascular ischemic disease.  MRI C-spine: normal cervical spinal cord.  2D Echo: 60-65%, atria normal in size LDL 145 HgbA1c 6.4 VTE prophylaxis - SCDs No antithrombotic prior to admission, now on aspirin  81 mg daily Therapy recommendations:  Pending Disposition:  pending  Seizure R gaze deviation was suspicious for seizure activity given the location of the stroke. Routine EEG: Negative for seizure Continuous video EEG day 1: epileptogenicity arising from L centro-temporo-parietal region. Day 2: no activity, dc'd longterm monitoring. Keppra  500mg  BID  Atrial fibrillation - no diagnosis Continue telemetry monitoring  Hypertension Home meds:  Norvasc , Propranolol  Unstable Long-term BP goal: 130/90  Hyperlipidemia Home meds:  Crestor  40mg  daily, resumed in hospital LDL 145, goal < 70 Continue statin at discharge  Diabetes type II Controlled Home meds:  Metformin  HgbA1c 6.4, goal < 7.0 CBGs SSI Recommend close follow-up with PCP for better DM control  Substance Abuse UDS pending  Dysphagia Patient has post-stroke dysphagia, SLP consulted    Diet   Diet NPO time specified  Advance diet as tolerated  Hospital day # 4    Patient is more arousable and interactive today and appears to follow commands consistently and is moving all 4 extremities.  Recommend wean off ventilatory support and extubate as per the critical care team.  No family at the bedside today.  Continue Keppra  for seizures and aspirin  for stroke prevention.  Discussed with Dr. Zaida critical care medicine.    I personally spent a total of 50 minutes in the care of the patient today including getting/reviewing separately obtained history, performing a medically appropriate exam/evaluation, counseling and educating, placing orders, referring and communicating with other health care professionals, documenting clinical information in the EHR, independently interpreting results, and coordinating care.         Eather Popp, MD Medical Director [facility] Pager: [phone] 07/29/2024 11:06 AM   To contact Stroke Continuity provider, please refer to [email or web address]. After hours, contact General Neurology

## 2024-07-29 NOTE — Progress Notes (Signed)
 "  NAME:  Carolyn Hunter, MRN:  [id number], DOB:  13-May-1951, LOS: 4 ADMISSION DATE:  07/25/2024, CONSULTATION DATE:  12/15 REFERRING MD:  Dr. Neysa, CHIEF COMPLAINT:  AMS   History of Present Illness:  Patient is a 73 yo F w/ pertinent PMH HTN, HLD, T2DM brought in to [facility] on 12/15 w/ acute encephalopathy.  On 12/15 patient found down on ground. Apparently fell yesterday. Last known well unclear.  Not protecting airway consistently in ER so intubated for airway protection.  Was potentially moving left side less so stroke Cts performed which were neg.  PCCM to admit.  Pertinent  Medical History   Past Medical History:  Diagnosis Date   Anxiety    Cataract    OS   Diabetes mellitus without complication (HCC)    Diabetic retinopathy (HCC)    NPDR OU   Hyperlipidemia    Hypertension    Hypertensive retinopathy    OU   Tremors of nervous system    just when I get in a nervous Situation.     Significant Hospital Events: Including procedures, antibiotic start and stop dates in addition to other pertinent events   12/15 admit MRI brain and MRI C-spine performed overnight showing small acute ischemic infarct in the left frontoparietal region. 12/18 ETT cuff leak, replaced and bronch performed due to bleeding, no active bleed  Interim History / Subjective:  Awakens to voice. Follows commands and moves all extremities L>R.  Afebrile, tmax 99. On precedex . Off fentanyl .  Weaning this AM, RSBI <105  Objective    Blood pressure 127/60, pulse 71, temperature 99.3 F (37.4 C), resp. rate 19, height 5' 3 (1.6 m), weight 72.2 kg, SpO2 97%.    Vent Mode: PRVC FiO2 (%):  [40 %] 40 % Set Rate:  [18 bmp] 18 bmp Vt Set:  [420 mL] 420 mL PEEP:  [5 cmH20] 5 cmH20 Plateau Pressure:  [12 cmH20-18 cmH20] 17 cmH20   Intake/Output Summary (Last 24 hours) at 07/29/2024 0708 Last data filed at 07/29/2024 0600 Gross per 24 hour  Intake 1442.4 ml  Output 585 ml  Net 857.4 ml   Filed  Weights   07/27/24 0312 07/28/24 0500 07/29/24 0500  Weight: 71.8 kg 75.3 kg 72.2 kg    Examination:  General: Awake, Ill-appearing female with a right eye bruise HENT: right eye bruise and able to open both eyes  Lungs: Ventilatory breath sounds  Cardiovascular: RRR  Abdomen: Soft, NT Extremities: Warm, no LE edema  Neuro:  Awake, follows commands and moves all extremities L>R  Resolved problem list  Elevated CK, improved  Assessment and Plan   Neuro Acute left frontotemporal ischemic stroke Seizures  - Appreciate neurology recommendations - Rosuvastatin  40 mg daily - Aspirin  81mg  - Echo with no acute abnormality - EEG 12/17 with epileptogenicity arising from the left central temporoparietal region. EEG 12/18 w/ moderate diffuse encephalopathy.  - Keppra  500 mg BID - Currently only precedex , wean as tolerated  Pulm Acute hypoxic respiratory failure in the setting of altered mental status and inability to protect airway Right basilar opacity likely atelectasis versus aspiration pneumonia - Continue ceftriaxone  (day 4/5) for suspected aspiration pneumonia. - Daily SBT off sedation and if tolerating extubation   Cardiac/Vascular  No issues.  Echocardiogram with no acute findings  GI Diet: Continue tube feeds, plan to exchange to [employer] today  GI PPX: Continue Protonix  daily Bowel: PRN Miralax  and colace, LBM 12/18  ID Right basilar opacity concerning for aspiration pneumonia  Asymptomatic bacteriuria - Ceftriaxone  for 5 days.  Will need to adjust duration of ceftriaxone  based on repeat blood culture results - Blood cultures 12/15 w/ aerococcus viridans in 1 bottle, suspect contaminant, 12/16 Bcx so far NG  Lines: PIV, ETT, Foley  Renal  No acute issues. Has foley, monitor UOP.   Endo T2DM A1c 6.4. CBG this AM 170.  -Lantus  8 units daily  -SSI-M Q4H  Heme/Onc Normocytic anemia Hgb 9.6 (9.7). Noted blood during ETT exchange and bronch w/o acute bleed. No  other signs of bleed. - Trend CBC   DVT ppx : Lovenox  daily   MSK/other  Multiple bruises with right eye ecchymosis CT head w/ R lateral periorbital hematoma CT C-spine with no acute fracture   Labs   CBC: Recent Labs  Lab 07/25/24 1324 07/25/24 1336 07/25/24 1712 07/26/24 0603 07/27/24 0302 07/28/24 0530 07/29/24 0427  WBC 13.5*  --   --  12.8* 11.4* 8.9 7.5  NEUTROABS 11.2*  --   --   --   --   --   --   HGB 13.7   < > 11.2* 12.3 11.0* 9.7* 9.6*  HCT 39.9   < > 33.0* 36.7 32.0* 29.6* 28.6*  MCV 94.1  --   --  96.8 95.5 97.4 96.3  PLT 239  --   --  189 168 165 190   < > = values in this interval not displayed.    Basic Metabolic Panel: Recent Labs  Lab 07/25/24 1324 07/25/24 1336 07/25/24 1712 07/26/24 0603 07/27/24 0302 07/28/24 0530 07/29/24 0427  NA 137 135  135 133* 134* 134* 139 140  K 4.2 4.3  4.3 3.5 3.7 4.3 4.0 4.3  CL 103 102  --  101 104 105 105  CO2 23  --   --  25 22 28 28   GLUCOSE 284* 284*  --  246* 148* 150* 209*  BUN 9 10  --  13 15 15 15   CREATININE 0.87 0.80  --  0.97 0.88 0.75 0.66  CALCIUM  9.0  --   --  8.4* 8.8* 8.9 9.1  MG  --   --   --  1.6* 2.5* 2.4 2.3  PHOS  --   --   --  2.7 2.0* 2.5 2.4*   GFR: Estimated Creatinine Clearance: 61.4 mL/min (by C-G formula based on SCr of 0.66 mg/dL). Recent Labs  Lab 07/25/24 1336 07/25/24 1725 07/26/24 0603 07/27/24 0302 07/28/24 0530 07/29/24 0427  WBC  --   --  12.8* 11.4* 8.9 7.5  LATICACIDVEN 4.7* 2.8*  --   --   --   --     Liver Function Tests: Recent Labs  Lab 07/25/24 1324  AST 48*  ALT 19  ALKPHOS 70  BILITOT 1.2  PROT 7.5  ALBUMIN 3.7   No results for input(s): LIPASE, AMYLASE in the last 168 hours. Recent Labs  Lab 07/25/24 1702  AMMONIA 31    ABG    Component Value Date/Time   PHART 7.430 07/25/2024 1712   PCO2ART 34.7 07/25/2024 1712   PO2ART 154 (H) 07/25/2024 1712   HCO3 23.0 07/25/2024 1712   TCO2 24 07/25/2024 1712   ACIDBASEDEF 1.0  07/25/2024 1712   O2SAT 99 07/25/2024 1712     Coagulation Profile: Recent Labs  Lab 07/25/24 1324  INR 1.1    Cardiac Enzymes: Recent Labs  Lab 07/25/24 1324 07/28/24 0530  CKTOTAL 4,216* 3,081*  CKMB 31.8*  --  HbA1C: HbA1c, POC (prediabetic range)  Date/Time Value Ref Range Status  01/05/2020 09:29 AM 7.6 (A) 5.7 - 6.4 % Final   HbA1c, POC (controlled diabetic range)  Date/Time Value Ref Range Status  01/05/2020 09:29 AM 7.6 (A) 0.0 - 7.0 % Final   HbA1c POC (<> result, manual entry)  Date/Time Value Ref Range Status  01/05/2020 09:29 AM 7.6 4.0 - 5.6 % Final   Hgb A1c MFr Bld  Date/Time Value Ref Range Status  07/25/2024 05:49 PM 6.4 (H) 4.8 - 5.6 % Final    Comment:    (NOTE) Diagnosis of Diabetes The following HbA1c ranges recommended by the American Diabetes Association (ADA) may be used as an aid in the diagnosis of diabetes mellitus.  Hemoglobin             Suggested A1C NGSP%              Diagnosis  <5.7                   Non Diabetic  5.7-6.4                Pre-Diabetic  >6.4                   Diabetic  <7.0                   Glycemic control for                       adults with diabetes.    07/30/2021 08:12 AM 6.7 (H) 4.8 - 5.6 % Final    Comment:             Prediabetes: 5.7 - 6.4          Diabetes: >6.4          Glycemic control for adults with diabetes: <7.0     CBG: Recent Labs  Lab 07/28/24 1101 07/28/24 1533 07/28/24 1941 07/28/24 2332 07/29/24 0341  GLUCAP 148* 232* 253* 207* 204*    Review of Systems:   Coma  Past Medical History:  She,  has a past medical history of Anxiety, Cataract, Diabetes mellitus without complication (HCC), Diabetic retinopathy (HCC), Hyperlipidemia, Hypertension, Hypertensive retinopathy, and Tremors of nervous system.   Surgical History:   Past Surgical History:  Procedure Laterality Date   CATARACT EXTRACTION W/PHACO Right 10/18/2018   Procedure: CATARACT EXTRACTION PHACO AND  INTRAOCULAR LENS PLACEMENT (IOC);  Surgeon: Harrie Agent, MD;  Location: [facility];  Service: Ophthalmology;  Laterality: Right;  CDE: 8.11   CATARACT EXTRACTION W/PHACO Left 01/17/2019   Procedure: CATARACT EXTRACTION PHACO AND INTRAOCULAR LENS PLACEMENT (IOC);  Surgeon: Harrie Agent, MD;  Location: [facility];  Service: Ophthalmology;  Laterality: Left;  CDE: 6.38   CESAREAN SECTION     1988   COLONOSCOPY WITH PROPOFOL  N/A 05/04/2020   Procedure: COLONOSCOPY WITH PROPOFOL ;  Surgeon: Eartha Angelia Sieving, MD;  Location: [facility];  Service: Gastroenterology;  Laterality: N/A;  830   EYE SURGERY Right    Cataract extraction OD   POLYPECTOMY  05/04/2020   Procedure: POLYPECTOMY;  Surgeon: Eartha Angelia Sieving, MD;  Location: [facility];  Service: Gastroenterology;;     Social History:   reports that she has never smoked. She has never used smokeless tobacco. She reports that she does not drink alcohol  and does not use drugs.   Family History:  Her family history includes Asthma in her  father; Breast cancer in her mother; Cancer in her mother; Diabetes in her brother, son, and son.   Allergies Allergies[1]   Home Medications  Prior to Admission medications  Medication Sig Start Date End Date Taking? Authorizing Provider  Aflibercept  (EYLEA  IO) Inject 1 Dose into the eye every 30 (thirty) days. Injectable med given @ retina office q 12 wks.    [provider]  amLODipine  (NORVASC ) 5 MG tablet TAKE 1 TABLET BY MOUTH EVERY DAY 04/17/21   Elnor Fairy HERO, NP  Cholecalciferol (VITAMIN D3) 50 MCG (2000 UT) TABS Take 2,000 Units by mouth daily.    [provider]  Liniments (SALONPAS PAIN RELIEF PATCH EX) Place 1 patch onto the skin daily as needed (pain.).    [provider]  metFORMIN  (GLUCOPHAGE ) 1000 MG tablet TAKE 1 TABLET BY MOUTH EVERY DAY WITH BREAKFAST 07/06/23   Antonetta Rollene BRAVO, MD  propranolol  ER (INDERAL  LA) 60 MG 24 hr capsule TAKE 1 CAPSULE BY  MOUTH EVERY DAY 05/17/21   Elnor Fairy HERO, NP  rosuvastatin  (CRESTOR ) 40 MG tablet Take 1 tablet (40 mg total) by mouth daily. 08/29/21   Paseda, Folashade R, FNP          [1]  Allergies Allergen Reactions   Ace Inhibitors Cough   Zetia  [Ezetimibe ] Swelling and Other (See Comments)    Sore throat   "

## 2024-07-29 NOTE — Procedures (Signed)
 Cortrak  Person Inserting Tube:  Carolyn Hunter, Darica Goren L, RD Tube Type:  Cortrak - 43 inches Tube Size:  10 Tube Location:  Left nare Secured by: Bridle Technique Used to Measure Tube Placement:  Marking at nare/corner of mouth Cortrak Secured At:  65 cm Initial Placement Verification:  Xray  Cortrak Tube Team Note:  Consult received to place a Cortrak feeding tube.   X-ray is required. RN may begin using tube after placement is confirmed.   If the tube becomes dislodged please keep the tube and contact the Cortrak team at [email or web address] for replacement.  If after hours and replacement cannot be delayed, place a NG tube and confirm placement with an abdominal x-ray.    Carolyn Hunter RD, LDN Registered Dietitian I Please see AMION for contact information

## 2024-07-29 NOTE — Plan of Care (Signed)
" °  Problem: Clinical Measurements: Goal: Ability to maintain clinical measurements within normal limits will improve Outcome: Progressing Goal: Will remain free from infection Outcome: Progressing Goal: Diagnostic test results will improve Outcome: Progressing Goal: Respiratory complications will improve Outcome: Progressing Goal: Cardiovascular complication will be avoided Outcome: Progressing   Problem: Nutrition: Goal: Adequate nutrition will be maintained Outcome: Progressing   Problem: Safety: Goal: Ability to remain free from injury will improve Outcome: Progressing   Problem: Skin Integrity: Goal: Risk for impaired skin integrity will decrease Outcome: Progressing   Problem: Self-Care: Goal: Ability to participate in self-care as condition permits will improve Outcome: Progressing Goal: Verbalization of feelings and concerns over difficulty with self-care will improve Outcome: Progressing Goal: Ability to communicate needs accurately will improve Outcome: Progressing   "

## 2024-07-29 NOTE — Progress Notes (Signed)
 SLP Cancellation Note  Patient Details Name: MANON BANBURY MRN: [id number] DOB: 04-Jul-1951   Cancelled treatment:        Attempted to see pt for cognitive-linguistic assessment.  Pt remains intubated at this time and is unable to participate in assessment.  Spoke with RN and extubation planned for later this date at present.  SLP will follow for readiness for assessment.  If pt has any swallowing needs post extubation, please place orders for swallow assessment at that time.   Anette FORBES Grippe, MA, CCC-SLP Acute Rehabilitation Services Office: [phone] 07/29/2024, 10:21 AM

## 2024-07-30 DIAGNOSIS — G40909 Epilepsy, unspecified, not intractable, without status epilepticus: Secondary | ICD-10-CM

## 2024-07-30 DIAGNOSIS — E785 Hyperlipidemia, unspecified: Secondary | ICD-10-CM | POA: Diagnosis not present

## 2024-07-30 DIAGNOSIS — R569 Unspecified convulsions: Secondary | ICD-10-CM | POA: Diagnosis not present

## 2024-07-30 DIAGNOSIS — I69391 Dysphagia following cerebral infarction: Secondary | ICD-10-CM | POA: Diagnosis not present

## 2024-07-30 DIAGNOSIS — G8384 Todd's paralysis (postepileptic): Secondary | ICD-10-CM | POA: Diagnosis not present

## 2024-07-30 DIAGNOSIS — E119 Type 2 diabetes mellitus without complications: Secondary | ICD-10-CM

## 2024-07-30 DIAGNOSIS — E1151 Type 2 diabetes mellitus with diabetic peripheral angiopathy without gangrene: Secondary | ICD-10-CM | POA: Diagnosis not present

## 2024-07-30 DIAGNOSIS — I10 Essential (primary) hypertension: Secondary | ICD-10-CM | POA: Diagnosis not present

## 2024-07-30 DIAGNOSIS — Z7984 Long term (current) use of oral hypoglycemic drugs: Secondary | ICD-10-CM | POA: Diagnosis not present

## 2024-07-30 DIAGNOSIS — I6350 Cerebral infarction due to unspecified occlusion or stenosis of unspecified cerebral artery: Secondary | ICD-10-CM | POA: Diagnosis not present

## 2024-07-30 DIAGNOSIS — J9601 Acute respiratory failure with hypoxia: Secondary | ICD-10-CM

## 2024-07-30 DIAGNOSIS — R8271 Bacteriuria: Secondary | ICD-10-CM

## 2024-07-30 DIAGNOSIS — R233 Spontaneous ecchymoses: Secondary | ICD-10-CM

## 2024-07-30 DIAGNOSIS — D649 Anemia, unspecified: Secondary | ICD-10-CM

## 2024-07-30 LAB — CBC
HCT: 29.5 % — ABNORMAL LOW (ref 36.0–46.0)
Hemoglobin: 9.8 g/dL — ABNORMAL LOW (ref 12.0–15.0)
MCH: 31.8 pg (ref 26.0–34.0)
MCHC: 33.2 g/dL (ref 30.0–36.0)
MCV: 95.8 fL (ref 80.0–100.0)
Platelets: 235 K/uL (ref 150–400)
RBC: 3.08 MIL/uL — ABNORMAL LOW (ref 3.87–5.11)
RDW: 11.7 % (ref 11.5–15.5)
WBC: 9.1 K/uL (ref 4.0–10.5)
nRBC: 0.3 % — ABNORMAL HIGH (ref 0.0–0.2)

## 2024-07-30 LAB — BASIC METABOLIC PANEL WITH GFR
Anion gap: 8 (ref 5–15)
BUN: 13 mg/dL (ref 8–23)
CO2: 28 mmol/L (ref 22–32)
Calcium: 8.9 mg/dL (ref 8.9–10.3)
Chloride: 105 mmol/L (ref 98–111)
Creatinine, Ser: 0.66 mg/dL (ref 0.44–1.00)
GFR, Estimated: >60 mL/min
Glucose, Bld: 177 mg/dL — ABNORMAL HIGH (ref 70–99)
Potassium: 4.2 mmol/L (ref 3.5–5.1)
Sodium: 140 mmol/L (ref 135–145)

## 2024-07-30 LAB — GLUCOSE, CAPILLARY
Glucose-Capillary: 160 mg/dL — ABNORMAL HIGH (ref 70–99)
Glucose-Capillary: 178 mg/dL — ABNORMAL HIGH (ref 70–99)
Glucose-Capillary: 212 mg/dL — ABNORMAL HIGH (ref 70–99)
Glucose-Capillary: 183 mg/dL — ABNORMAL HIGH (ref 70–99)
Glucose-Capillary: 136 mg/dL — ABNORMAL HIGH (ref 70–99)
Glucose-Capillary: 114 mg/dL — ABNORMAL HIGH (ref 70–99)

## 2024-07-30 LAB — MAGNESIUM: Magnesium: 2 mg/dL (ref 1.7–2.4)

## 2024-07-30 LAB — PHOSPHORUS: Phosphorus: 2.8 mg/dL (ref 2.5–4.6)

## 2024-07-30 MED ORDER — NUTRISOURCE FIBER PO PACK
1.0000 | PACK | Freq: Two times a day (BID) | ORAL | Status: DC
  Administered 2024-07-30 – 2024-08-01 (×4): 1
  Filled 2024-07-30 (×8): qty 1

## 2024-07-30 MED ORDER — INSULIN ASPART 100 UNIT/ML IJ SOLN
0.0000 [IU] | INTRAMUSCULAR | Status: DC
  Administered 2024-07-30 (×2): 4 [IU] via SUBCUTANEOUS
  Administered 2024-07-30: 3 [IU] via SUBCUTANEOUS
  Administered 2024-07-31: 4 [IU] via SUBCUTANEOUS
  Administered 2024-07-31: 3 [IU] via SUBCUTANEOUS
  Administered 2024-07-31: 11 [IU] via SUBCUTANEOUS
  Administered 2024-07-31: 7 [IU] via SUBCUTANEOUS
  Administered 2024-07-31: 4 [IU] via SUBCUTANEOUS
  Administered 2024-08-01: 3 [IU] via SUBCUTANEOUS
  Administered 2024-08-01: 4 [IU] via SUBCUTANEOUS
  Administered 2024-08-01: 3 [IU] via SUBCUTANEOUS
  Administered 2024-08-01 – 2024-08-02 (×4): 4 [IU] via SUBCUTANEOUS
  Administered 2024-08-02: 11 [IU] via SUBCUTANEOUS
  Administered 2024-08-02: 4 [IU] via SUBCUTANEOUS
  Administered 2024-08-02: 3 [IU] via SUBCUTANEOUS
  Administered 2024-08-02: 4 [IU] via SUBCUTANEOUS
  Filled 2024-07-30 (×2): qty 3
  Filled 2024-07-30: qty 11
  Filled 2024-07-30: qty 4
  Filled 2024-07-30: qty 3
  Filled 2024-07-30: qty 4
  Filled 2024-07-30: qty 3
  Filled 2024-07-30: qty 4
  Filled 2024-07-30: qty 11
  Filled 2024-07-30: qty 3
  Filled 2024-07-30 (×2): qty 4
  Filled 2024-07-30: qty 6
  Filled 2024-07-30 (×3): qty 4
  Filled 2024-07-30: qty 3
  Filled 2024-07-30: qty 5
  Filled 2024-07-30: qty 3

## 2024-07-30 MED ORDER — ORAL CARE MOUTH RINSE
15.0000 mL | OROMUCOSAL | Status: DC
  Administered 2024-07-30 – 2024-08-02 (×14): 15 mL via OROMUCOSAL

## 2024-07-30 NOTE — Progress Notes (Signed)

## 2024-07-30 NOTE — Evaluation (Signed)
 Clinical/Bedside Swallow Evaluation Patient Details  Name: Carolyn Hunter MRN: [id number] Date of Birth: 1951/01/10  Today's Date: 07/30/2024 Time: SLP Start Time (ACUTE ONLY): 1028 SLP Stop Time (ACUTE ONLY): 1051 SLP Time Calculation (min) (ACUTE ONLY): 23 min  Past Medical History:  Past Medical History:  Diagnosis Date   Anxiety    Cataract    OS   Diabetes mellitus without complication (HCC)    Diabetic retinopathy (HCC)    NPDR OU   Hyperlipidemia    Hypertension    Hypertensive retinopathy    OU   Tremors of nervous system    just when I get in a nervous Situation.   Past Surgical History:  Past Surgical History:  Procedure Laterality Date   CATARACT EXTRACTION W/PHACO Right 10/18/2018   Procedure: CATARACT EXTRACTION PHACO AND INTRAOCULAR LENS PLACEMENT (IOC);  Surgeon: Harrie Agent, MD;  Location: [facility];  Service: Ophthalmology;  Laterality: Right;  CDE: 8.11   CATARACT EXTRACTION W/PHACO Left 01/17/2019   Procedure: CATARACT EXTRACTION PHACO AND INTRAOCULAR LENS PLACEMENT (IOC);  Surgeon: Harrie Agent, MD;  Location: [facility];  Service: Ophthalmology;  Laterality: Left;  CDE: 6.38   CESAREAN SECTION     1988   COLONOSCOPY WITH PROPOFOL  N/A 05/04/2020   Procedure: COLONOSCOPY WITH PROPOFOL ;  Surgeon: Eartha Angelia Sieving, MD;  [facility]: [facility];  Service: Gastroenterology;  Laterality: N/A;  830   EYE SURGERY Right    Cataract extraction OD   POLYPECTOMY  05/04/2020   Procedure: POLYPECTOMY;  Surgeon: Eartha Angelia Sieving, MD;  [facility]: [facility];  Service: Gastroenterology;;   HPI:  73 year old female who presented to the hospital 12/15 with fall, right-sided weakness, encephalopathy. Noted to have small left frontotemporal stroke on her MRI. Intubated 12/15-12/19. PMH: hypertension, hyperlipidemia, type 2 diabetes.    Assessment / Plan / Recommendation  Clinical Impression    SLP Visit Diagnosis: Dysphagia, unspecified (R13.10)     Aspiration Risk  Mild aspiration risk    Diet Recommendation   Pt continues to present with s/sx of aspiration and should remain NPO with nutrition and meds met via alternative means. Pt can have fraizer free water  protocol and comfort trials of puree but NOT together. Extensive oral care should be performed prior to any trials. ST to f/u closely to determine  PO readiness and appropriateness for MBSS given more time and healing post extubation    Pt reports feeling nauseous. OME was generally unremarkable, with generalized weakness and a low voice. Pt consumed ice chips, thin liquid, and nectar thick liquid with intermittent s/sx of aspiration- decreased signs with reduced bolus size. Consumed small bites of puree with no coughing but had poor general bolus awareness- suspected due to decreased attention and mentation.Pt unable to feed herself without assistance from SLP and verbal support for bolus size reduction. Pt and family educated on the s/s of aspiration, the risks, the importance of oral care, the diet recommendation, and treatment plan- they all verbally acknowledged understanding and were agreeable to SLP suggestion. ST recommendation of NPO with needs met via placed NG tube, and comfort trials of puree (applesauce, pudding) given assistance and verbal support following extensive oral care and for free frazier water  protocol (Do NOT allow attempts to drink any water  when consuming comfort trials of puree). SLP to f/u closely and determine PO readiness and readiness for MBSS.   Other Recommendations Caregiver Recommendations: Remove water  pitcher     Swallow Evaluation Recommendations Recommendations: NPO;Free water  protocol after oral  care;Alternative means of nutrition - NG Tube (Comfort trials of puree following oral care) Liquid Administration via: Spoon (full supervision for compensatory strategies) Medication Administration: Via alternative means Supervision: Full supervision/cueing  for swallowing strategies;Staff to assist with self-feeding;Other (comment) (For comfort trails and frazier free water , NPO recs still stand) Swallowing strategies  : Small bites/sips;Slow rate;Minimize environmental distractions;effortful swallow;Avoid mixed consistencies Postural changes: Position pt fully upright for meals;Stay upright 30-60 min after meals Oral care recommendations: Oral care QID (4x/day);Oral care before ice chips/water ;Oral care before PO;Staff/trained caregiver to provide oral care Caregiver Recommendations: Remove water  pitcher   Assistance Recommended at Discharge    Functional Status Assessment Patient has had a recent decline in their functional status and demonstrates the ability to make significant improvements in function in a reasonable and predictable amount of time.  Frequency and Duration min 3x week  1 week       Prognosis Prognosis for improved oropharyngeal function: Good Barriers to Reach Goals: Cognitive deficits;Time post onset      Swallow Study   General Date of Onset: 07/25/24 HPI: 73 year old female who presented to the hospital 12/15 with fall, right-sided weakness, encephalopathy. Noted to have small left frontotemporal stroke on her MRI. Intubated 12/15-12/19. PMH: hypertension, hyperlipidemia, type 2 diabetes. Type of Study: Bedside Swallow Evaluation Previous Swallow Assessment: n/a Diet Prior to this Study: NPO Temperature Spikes Noted: No Respiratory Status: Room air History of Recent Intubation: Yes Total duration of intubation (days): 5 days Date extubated: 07/29/24 Behavior/Cognition: Confused;Lethargic/Drowsy;Distractible;Requires cueing;Cooperative Oral Cavity - Dentition: Missing dentition Vision: Impaired for self-feeding Self-Feeding Abilities: Total assist;Needs assist Patient Positioning: Upright in chair Baseline Vocal Quality: Low vocal intensity Volitional Cough: Strong Volitional Swallow: Able to elicit     Oral/Motor/Sensory Function Overall Oral Motor/Sensory Function: Mild impairment Facial ROM: Reduced right;Reduced left Facial Symmetry: Within Functional Limits Facial Strength: Reduced right;Reduced left Lingual ROM: Within Functional Limits Lingual Symmetry: Within Functional Limits Lingual Strength: Reduced   Ice Chips Ice chips: Impaired Presentation: Spoon;Self Fed Oral Phase Impairments: Poor awareness of bolus Pharyngeal Phase Impairments: Cough - Immediate   Thin Liquid Thin Liquid: Impaired Presentation: Spoon;Cup;Self Fed;Straw Oral Phase Impairments: Poor awareness of bolus Oral Phase Functional Implications: Oral holding Pharyngeal  Phase Impairments: Cough - Immediate;Cough - Delayed    Nectar Thick Nectar Thick Liquid: Impaired Presentation: Spoon;Self Fed;Straw Oral Phase Impairments: Poor awareness of bolus Oral phase functional implications: Oral holding Pharyngeal Phase Impairments: Throat Clearing - Immediate   Honey Thick Honey Thick Liquid: Not tested   Puree Puree: Within functional limits Presentation: Self Fed;Spoon   Solid     Solid: Not tested     Manuelita Blew M.S. CCC-SLP

## 2024-07-30 NOTE — Evaluation (Signed)
 Speech Language Pathology Evaluation Patient Details Name: Carolyn Hunter MRN: [id number] DOB: 1951-04-19 Today's Date: 07/30/2024 Time: 8986-8972 SLP Time Calculation (min) (ACUTE ONLY): 14 min  Problem List:  Patient Active Problem List   Diagnosis Date Noted   Ischemic stroke (HCC) 07/29/2024   Encephalopathy 07/27/2024   Coma (HCC) 07/25/2024   Tachycardia 03/28/2021   Onychomycosis of toenail 03/28/2021   Need for vaccination against Streptococcus pneumoniae using pneumococcal conjugate vaccine 13 01/06/2020   Nonspecific abnormal electrocardiogram (ECG) (EKG) 01/06/2020   Annual visit for general adult medical examination with abnormal findings 01/05/2020   Visit for screening mammogram 01/05/2020   Screening for osteoporosis 01/05/2020   Essential hypertension 01/05/2020   Encounter for screening colonoscopy 01/05/2020   Controlled type 2 diabetes mellitus without complication, without long-term current use of insulin  (HCC) 04/17/2017   White coat syndrome with hypertension 04/17/2017   HLD (hyperlipidemia) 04/17/2017   Tremor 04/17/2017   Past Medical History:  Past Medical History:  Diagnosis Date   Anxiety    Cataract    OS   Diabetes mellitus without complication (HCC)    Diabetic retinopathy (HCC)    NPDR OU   Hyperlipidemia    Hypertension    Hypertensive retinopathy    OU   Tremors of nervous system    just when I get in a nervous Situation.   Past Surgical History:  Past Surgical History:  Procedure Laterality Date   CATARACT EXTRACTION W/PHACO Right 10/18/2018   Procedure: CATARACT EXTRACTION PHACO AND INTRAOCULAR LENS PLACEMENT (IOC);  Surgeon: Harrie Agent, MD;  Location: [facility];  Service: Ophthalmology;  Laterality: Right;  CDE: 8.11   CATARACT EXTRACTION W/PHACO Left 01/17/2019   Procedure: CATARACT EXTRACTION PHACO AND INTRAOCULAR LENS PLACEMENT (IOC);  Surgeon: Harrie Agent, MD;  Location: [facility];  Service: Ophthalmology;  Laterality: Left;   CDE: 6.38   CESAREAN SECTION     1988   COLONOSCOPY WITH PROPOFOL  N/A 05/04/2020   Procedure: COLONOSCOPY WITH PROPOFOL ;  Surgeon: Eartha Angelia Sieving, MD;  [facility];  Service: Gastroenterology;  Laterality: N/A;  830   EYE SURGERY Right    Cataract extraction OD   POLYPECTOMY  05/04/2020   Procedure: POLYPECTOMY;  Surgeon: Eartha Angelia Sieving, MD;  [facility];  Service: Gastroenterology;;   HPI:  73 year old female who presented to the hospital 12/15 with fall, right-sided weakness, encephalopathy. Noted to have small left frontotemporal stroke on her MRI. Intubated 12/15-12/19. PMH: hypertension, hyperlipidemia, type 2 diabetes.   Assessment / Plan / Recommendation Clinical Impression   Pt presents with a severe cognitive impairment, given the [facility] [place]  Mental Status Exam (SLUMS) she scored 7/26, which is well below the cognitive norms. Her main areas of difficulty were attention, recall, orientation. She would benefit from skilled ST services while here to address cognitive deficits, she currently would be cognitively unable to address her needs and would benefit from acute rehab services to address her swallow and cognitive needs  Per pt and family report, she was independent with all iADLs at baseline. They report today she is doing much better than she was yesterday. She was unable to recall events of her hospitalization or what brought her here, She also perseverated on questions well after they were asked (I.e. attempting to list animals when asked verbal recall words, her birth year instead of the current year). However she was able to use her environmental cues to gather the month and date, displaying adequate functional problem  solving. Her basic verbal problem solving was poor, and she was physically unable to write a clock so her test was scored out of 26 not 30. She had increased success when given a verbal choice of two and  other verbal support. She was aware her performance today was worse than it would have been previously but overall appeared to have reduced awareness of the severity of difficulty she had with each task. Pt would benefit with cognitive tx integrated into dysphagia treatment. ST to f/u closely.     SLP Assessment  SLP Recommendation/Assessment: Patient needs continued Speech Language Pathology Services SLP Visit Diagnosis: Cognitive communication deficit (R41.841)     Assistance Recommended at Discharge  None  Functional Status Assessment Patient has had a recent decline in their functional status and demonstrates the ability to make significant improvements in function in a reasonable and predictable amount of time.  Frequency and Duration min 3x week  1 week      SLP Evaluation Cognition  Overall Cognitive Status: Impaired/Different from baseline Arousal/Alertness: Lethargic Orientation Level: Oriented to person;Disoriented to situation;Disoriented to time;Oriented to place Year: Other (Comment) (8044) Month: December Day of Week: Incorrect (correct given choice of two) Attention: Focused;Sustained Focused Attention: Impaired Focused Attention Impairment: Verbal basic;Functional basic Sustained Attention: Impaired Sustained Attention Impairment: Verbal basic;Functional basic Memory: Impaired Memory Impairment: Storage deficit;Retrieval deficit;Decreased recall of new information;Decreased short term memory Decreased Short Term Memory: Verbal basic;Functional basic Awareness: Impaired Awareness Impairment: Intellectual impairment;Anticipatory impairment;Emergent impairment Problem Solving: Impaired Problem Solving Impairment: Verbal basic;Functional basic Executive Function: Organizing;Reasoning Reasoning: Impaired Reasoning Impairment: Functional basic Organizing: Impaired Organizing Impairment: Functional basic Behaviors: Perseveration Safety/Judgment: Impaired        Comprehension  Auditory Comprehension Overall Auditory Comprehension: Impaired Yes/No Questions: Within Functional Limits Commands: Within Functional Limits Conversation: Simple Interfering Components: Motor planning;Working civil service fast streamer;Attention EffectiveTechniques: Extra processing time;Repetition;Visual/Gestural cues;Pausing Visual Recognition/Discrimination Discrimination: Within Function Limits    Expression Expression Primary Mode of Expression: Verbal Verbal Expression Overall Verbal Expression: Appears within functional limits for tasks assessed   Oral / Motor  Oral Motor/Sensory Function Overall Oral Motor/Sensory Function: Mild impairment Facial ROM: Reduced right;Reduced left Facial Symmetry: Within Functional Limits Facial Strength: Reduced right;Reduced left Lingual ROM: Within Functional Limits Lingual Symmetry: Within Functional Limits Lingual Strength: Reduced Motor Speech Overall Motor Speech: Impaired Phonation: Low vocal intensity            Manuelita Blew M.S. CCC-SLP

## 2024-07-30 NOTE — Evaluation (Signed)
 Physical Therapy Evaluation Patient Details Name: Carolyn Hunter MRN: [id number] DOB: 1950-10-22 Today's Date: 07/30/2024  History of Present Illness  73 year old female who presented to the hospital 12/15 with fall, right-sided weakness, encephalopathy. Noted to have small left frontotemporal stroke on her MRI. Intubated 12/15-12/19. PMH: hypertension, hyperlipidemia, type 2 diabetes.  Clinical Impression  Pt admitted with above diagnosis. Previously independent, active, and driving. Lives with husband, has 24/7 assist available. Required up to mod assist for transfer and gait up to 30 feet today. Demonstrates rightward lean but can correct with multimodal cues. Interestingly, LLE is slightly weaker than RLE today. RUE still very weak and edematous. Difficulty gripping RW with RUE. No overt buckling however demonstrates poor balance and walker control. Pt with stool incontinence. Patient will benefit from intensive inpatient follow-up therapy, >3 hours/day.  Pt currently with functional limitations due to the deficits listed below (see PT Problem List). Pt will benefit from acute skilled PT to increase their independence and safety with mobility to allow discharge.           If plan is discharge home, recommend the following: A lot of help with walking and/or transfers;A lot of help with bathing/dressing/bathroom;Assistance with cooking/housework;Direct supervision/assist for medications management;Direct supervision/assist for financial management;Help with stairs or ramp for entrance;Assist for transportation;Supervision due to cognitive status   Can travel by private vehicle        Equipment Recommendations Rolling walker (2 wheels) (with Rt hand trough)  Recommendations for Other Services  Rehab consult    Functional Status Assessment Patient has had a recent decline in their functional status and demonstrates the ability to make significant improvements in function in a reasonable and  predictable amount of time.     Precautions / Restrictions Precautions Precautions: Fall Recall of Precautions/Restrictions: Impaired Restrictions Weight Bearing Restrictions Per Provider Order: No      Mobility  Bed Mobility Overal bed mobility: Needs Assistance Bed Mobility: Supine to Sit     Supine to sit: Min assist, HOB elevated     General bed mobility comments: Min assist for trunk support to rise, able to bring LEs off bed with VC.    Transfers Overall transfer level: Needs assistance Equipment used: Rolling walker (2 wheels), 1 person hand held assist Transfers: Sit to/from Stand, Bed to chair/wheelchair/BSC Sit to Stand: Mod assist, Min assist   Step pivot transfers: Mod assist, Min assist       General transfer comment: Min assist to stand with RW, Mod assist to stand with hand held support. Demonstrates rightward lean, requires multimodal cues to facilitate midline stance. Step pivots with RW at min A level, but mod A with hand held support. Stood several times from bed, recliner and BSC.    Ambulation/Gait Ambulation/Gait assistance: Mod assist, +2 safety/equipment Gait Distance (Feet): 30 Feet Assistive device: Rolling walker (2 wheels) Gait Pattern/deviations: Step-through pattern, Decreased stride length, Shuffle, Ataxic, Drifts right/left, Trunk flexed, Wide base of support Gait velocity: dec Gait velocity interpretation: <1.31 ft/sec, indicative of household ambulator Pre-gait activities: Weight shift, midline control, static march. Min assist with RW for support. General Gait Details: Up to mod assist intermittently for balance and RW control. Cues for upright posture, proximity to device to maximize support. +2 assist for chair follow. Poor grip with RUE, needing assist to grasp intermittently to RW. No overt buckling. Further distance limited by episode of stool incontinence.  Stairs            Psychologist, [facility]  Tilt Bed     Modified Rankin (Stroke Patients Only) Modified Rankin (Stroke Patients Only) Pre-Morbid Rankin Score: No symptoms Modified Rankin: Moderately severe disability     Balance Overall balance assessment: Needs assistance Sitting-balance support: Single extremity supported, Feet unsupported Sitting balance-Leahy Scale: Poor Sitting balance - Comments: Up to min assist for balance, leaning Rt and occasionaly too far forward. Postural control: Right lateral lean Standing balance support: Single extremity supported, Reliant on assistive device for balance Standing balance-Leahy Scale: Poor Standing balance comment: Min assist with LUE holding RW or therapist's hand.                             Pertinent Vitals/Pain Pain Assessment Pain Assessment: No/denies pain    Home Living Family/patient expects to be discharged to:: Private residence Living Arrangements: Spouse/significant other Available Help at Discharge: Family;Available 24 hours/day Type of Home: House Home Access: Stairs to enter Entrance Stairs-Rails: None Entrance Stairs-Number of Steps: 1   Home Layout: Two level;Able to live on main level with bedroom/bathroom Home Equipment: Cane - single point;BSC/3in1      Prior Function Prior Level of Function : Independent/Modified Independent;Driving             Mobility Comments: Prior to admission pt was ambulatory without AD, driving, and active. ADLs Comments: Ind at baseline.     Extremity/Trunk Assessment   Upper Extremity Assessment Upper Extremity Assessment: Defer to OT evaluation;Right hand dominant    Lower Extremity Assessment Lower Extremity Assessment: LLE deficits/detail;Generalized weakness;Difficult to assess due to impaired cognition LLE Deficits / Details: Generalized weakness but today her LLE is slightly weaker than RLE; able to lift LLE from bed and tolerate only a little resistance. She can however fully bear weight in  standing. LLE Sensation:  (Reports normal sensation to light touch BIL LEs)       Communication   Communication Communication: Impaired Factors Affecting Communication: Reduced clarity of speech    Cognition Arousal: Alert Behavior During Therapy: WFL for tasks assessed/performed, Impulsive   PT - Cognitive impairments: Orientation, Awareness, Memory, Attention, Sequencing, Problem solving, Safety/Judgement   Orientation impairments: Time, Situation                   PT - Cognition Comments: Oriented to month but repeatedly states her birth year when asking current year. Following commands: Impaired Following commands impaired: Follows one step commands inconsistently     Cueing Cueing Techniques: Verbal cues, Gestural cues, Tactile cues, Visual cues     General Comments General comments (skin integrity, edema, etc.): SpO2 92% on RA throughout session. HR 90s, BP 147/71.    Exercises     Assessment/Plan    PT Assessment Patient needs continued PT services  PT Problem List Decreased strength;Decreased activity tolerance;Decreased balance;Decreased coordination;Decreased mobility;Decreased cognition;Decreased knowledge of use of DME;Decreased safety awareness;Decreased knowledge of precautions       PT Treatment Interventions Gait training;DME instruction;Stair training;Functional mobility training;Therapeutic activities;Balance training;Therapeutic exercise;Neuromuscular re-education;Cognitive remediation;Patient/family education    PT Goals (Current goals can be found in the Care Plan section)  Acute Rehab PT Goals Patient Stated Goal: get well, go home PT Goal Formulation: With patient/family Time For Goal Achievement: 08/13/24 Potential to Achieve Goals: Good    Frequency Min 3X/week     Co-evaluation               AM-PAC PT 6 Clicks Mobility  Outcome Measure Help needed turning from your  back to your side while in a flat bed without using  bedrails?: A Little Help needed moving from lying on your back to sitting on the side of a flat bed without using bedrails?: A Little Help needed moving to and from a bed to a chair (including a wheelchair)?: A Lot Help needed standing up from a chair using your arms (e.g., wheelchair or bedside chair)?: A Lot Help needed to walk in hospital room?: A Lot Help needed climbing 3-5 steps with a railing? : Total 6 Click Score: 13    End of Session Equipment Utilized During Treatment: Gait belt Activity Tolerance: Patient tolerated treatment well Patient left: in chair;with call bell/phone within reach;with nursing/sitter in room;with family/visitor present;with chair alarm set (SLP arrived for evaluation) Nurse Communication: Mobility status PT Visit Diagnosis: Unsteadiness on feet (R26.81);Other abnormalities of gait and mobility (R26.89);Muscle weakness (generalized) (M62.81);History of falling (Z91.81);Ataxic gait (R26.0);Difficulty in walking, not elsewhere classified (R26.2);Other symptoms and signs involving the nervous system (R29.898);Hemiplegia and hemiparesis Hemiplegia - Right/Left: Right (although LLE a little weaker today) Hemiplegia - dominant/non-dominant: Dominant    Time: 9085-8986 PT Time Calculation (min) (ACUTE ONLY): 59 min   Charges:   PT Evaluation $PT Eval Moderate Complexity: 1 Mod PT Treatments $Gait Training: 8-22 mins $Therapeutic Activity: 23-37 mins PT General Charges $$ ACUTE PT VISIT: 1 Visit         Leontine Roads, PT, DPT [facility]  Rehabilitation Services Physical Therapist Office: [phone] Website: [email or web address]   Leontine GORMAN Roads 07/30/2024, 10:51 AM

## 2024-07-30 NOTE — Progress Notes (Signed)
 Report given at bedside to Merlynn PEAK- charge RN on [facility].   All personal belongings returned. Family updated.

## 2024-07-30 NOTE — Progress Notes (Signed)
 "  NAME:  Carolyn Hunter, MRN:  [id number], DOB:  01/01/1951, LOS: 5 ADMISSION DATE:  07/25/2024, CONSULTATION DATE:  12/15 REFERRING MD:  Dr. Neysa, CHIEF COMPLAINT:  AMS   History of Present Illness:  Patient is a 73 yo F w/ pertinent PMH HTN, HLD, T2DM brought in to [facility] on 12/15 w/ acute encephalopathy.  On 12/15 patient found down on ground. Apparently fell yesterday. Last known well unclear.  Not protecting airway consistently in ER so intubated for airway protection.  Was potentially moving left side less so stroke Cts performed which were neg.  PCCM to admit.  Pertinent  Medical History   Past Medical History:  Diagnosis Date   Anxiety    Cataract    OS   Diabetes mellitus without complication (HCC)    Diabetic retinopathy (HCC)    NPDR OU   Hyperlipidemia    Hypertension    Hypertensive retinopathy    OU   Tremors of nervous system    just when I get in a nervous Situation.     Significant Hospital Events: Including procedures, antibiotic start and stop dates in addition to other pertinent events   12/15 admit MRI brain and MRI C-spine performed overnight showing small acute ischemic infarct in the left frontoparietal region. 12/18 ETT cuff leak, replaced and bronch performed due to bleeding, no active bleed 12/19 Extubated  Interim History / Subjective:  Extubated yesterday Cortrak placed Sitting in chair this morning. Remains weak and nauseated. Husband and son at bedside. Addressed questions and concerns Objective    Blood pressure (!) 147/71, pulse 96, temperature 99.1 F (37.3 C), resp. rate (!) 26, height 5' 3 (1.6 m), weight 72.8 kg, SpO2 93%.    FiO2 (%):  [36 %] 36 %   Intake/Output Summary (Last 24 hours) at 07/30/2024 1022 Last data filed at 07/30/2024 0700 Gross per 24 hour  Intake 764.69 ml  Output 1585 ml  Net -820.31 ml   Filed Weights   07/28/24 0500 07/29/24 0500 07/30/24 0344  Weight: 75.3 kg 72.2 kg 72.8 kg   Physical  Exam: General: Chronically ill-appearing, no acute distress, sitting in chair  HENT: [place], AT, OP clear, MMM Eyes: Right eye bruising, EOMI, no scleral icterus Respiratory: Coarse breath sounds to auscultation bilaterally.  No crackles, wheezing or rales Cardiovascular: RRR, -M/R/G, no JVD GI: BS+, soft, nontender Extremities:-Edema,-tenderness Neuro: AAO x4, CNII-XII grossly intact, globally weak Psych: Normal mood, normal affect GU: Foley in place   Resolved problem list  Elevated CK, improved  Assessment and Plan   Neuro Acute left frontotemporal ischemic stroke Seizures  - Stroke team following - Rosuvastatin  40 mg daily - Aspirin  81mg  - Echo with no acute abnormality - EEG 12/17 with epileptogenicity arising from the left central temporoparietal region. EEG 12/18 w/ moderate diffuse encephalopathy.  - Keppra  500 mg BID - Off precedex   Pulm Acute hypoxic respiratory failure in the setting of altered mental status and inability to protect airway Right basilar opacity likely atelectasis versus aspiration pneumonia - Extubated 12/19 - Wean supplemental O2 for goal >88% - Complete ceftriaxone  x 5 days for suspected aspiration pneumonia.  GI Diet: Continue tube feeds, plan to exchange to CorTrak today  GI PPX: Continue Protonix  daily Bowel: PRN Miralax  and colace, LBM 12/18  ID Right basilar opacity concerning for aspiration pneumonia Asymptomatic bacteriuria - Ceftriaxone  for 5 days - Blood cultures 12/15 w/ aerococcus viridans in 1 bottle, suspect contaminant, 12/16 Bcx NGTD  Lines: PIV, ETT, Foley  Endo T2DM A1c 6.4. CBG this AM 170.  -Lantus  8 units daily  -SSI-M Q4H  Heme/Onc Normocytic anemia Hgb 9.6 (9.7). Noted blood during ETT exchange and bronch w/o acute bleed. No other signs of bleed. - Trend CBC   DVT ppx : Lovenox  daily   MSK/other  Multiple bruises with right eye ecchymosis CT head w/ R lateral periorbital hematoma CT C-spine with no acute  fracture  Care Time: 50 min Ready to transfer to floor.  Slater Staff, M.D. [facility] Pulmonary/Critical Care Medicine 07/30/2024 10:22 AM   Please see Amion for pager number to reach on-call Pulmonary and Critical Care Team.    "

## 2024-07-30 NOTE — Progress Notes (Signed)
 STROKE TEAM PROGRESS NOTE    SIGNIFICANT HOSPITAL EVENTS 12/15: Admitted  INTERIM HISTORY/SUBJECTIVE No acute events overnight.  Had an extensive conversation with patient and daughter at bedside.  No reported seizure-like activity.  OBJECTIVE  CBC    Component Value Date/Time   WBC 9.1 07/30/2024 0351   RBC 3.08 (L) 07/30/2024 0351   HGB 9.8 (L) 07/30/2024 0351   HGB 13.1 07/30/2021 0812   HCT 29.5 (L) 07/30/2024 0351   HCT 37.3 07/30/2021 0812   PLT 235 07/30/2024 0351   PLT 218 07/30/2021 0812   MCV 95.8 07/30/2024 0351   MCV 90 07/30/2021 0812   MCH 31.8 07/30/2024 0351   MCHC 33.2 07/30/2024 0351   RDW 11.7 07/30/2024 0351   RDW 11.2 (L) 07/30/2021 0812   LYMPHSABS 0.7 07/25/2024 1324   LYMPHSABS 1.8 07/30/2021 0812   MONOABS 1.5 (H) 07/25/2024 1324   EOSABS 0.0 07/25/2024 1324   EOSABS 0.3 07/30/2021 0812   BASOSABS 0.0 07/25/2024 1324   BASOSABS 0.0 07/30/2021 0812    BMET    Component Value Date/Time   NA 140 07/30/2024 0351   NA 141 07/30/2021 0812   K 4.2 07/30/2024 0351   CL 105 07/30/2024 0351   CO2 28 07/30/2024 0351   GLUCOSE 177 (H) 07/30/2024 0351   BUN 13 07/30/2024 0351   BUN 10 07/30/2021 0812   CREATININE 0.66 07/30/2024 0351   CREATININE 0.78 01/10/2020 0803   CALCIUM  8.9 07/30/2024 0351   EGFR 75 07/30/2021 0812   GFRNONAA >60 07/30/2024 0351   GFRNONAA 79 01/10/2020 0803    IMAGING past 24 hours No results found.    Vitals:   07/30/24 1700 07/30/24 1800 07/30/24 1925 07/30/24 1930  BP: (!) 155/68 (!) 158/73 (!) 161/71   Pulse: 98 (!) 102 95 96  Resp: (!) 21 (!) 24 (!) 27 (!) 26  Temp:      TempSrc:      SpO2: 92% 90% 95% 95%  Weight:      Height:         PHYSICAL EXAM General: No acute distress, verbal and follows commands CV: Regular rate and rhythm on monitor  NEURO:  Mental Status: Alert and oriented to person, place, and daughter.  Can follow simple commands, answer questions, without any obvious aphasia.   Slight slurred speech.   Cranial Nerves II - XII - II - Visual field intact OU. III, IV, VI - Extraocular movements intact. V - Facial sensation intact bilaterally. VII - Facial movement intact bilaterally. VIII - Hearing & vestibular intact bilaterally. X - Palate elevates symmetrically. XI - Chin turning & shoulder shrug intact bilaterally. XII - Tongue protrusion grossly intact.  Motor Strength -drift in right upper extremity, without hitting the bed, otherwise able to sustain all other extremities antigravity.   Motor Tone - Muscle tone was assessed at the neck and appendages and was normal.  Reflexes - The patients reflexes were symmetrical in all extremities and she had no pathological reflexex.  Sensory - Light touch was assessed and was symmetrical.    Coordination -no obvious ataxia or dysmetria elicited movements.  Gait and Station - deferred.     ASSESSMENT/PLAN  [name] is a 73 y.o. female with history of diabetes, HTN, HLD admitted for acute encephalopathy.  NIH on Admission 25  Acute Ischemic Infarct: Overall presentation is more consistent with seizure and subsequent postictal confusion and Todd's paralysis.  However, patient does have a small acute ischemic infarcts  of the L posterior frontopareital region which can explain her right upper extremity weakness and can be a nidus for seizures.  Etiology of stroke is cryptogenic at this time.  Patient will need cardiac monitoring with Zio patch.  Code Stroke CT head: No acute abnormality. ASPECTS 10.    CTA head & neck: No acute large vessel occlusion, motion limited. Severe stenosis of L V1 w/o occlusion. Moderate stenosis of posterior P2 of L PCA.  MRI brain: Smal acute ischemic infarcts of the L posterior frontoparietal region. Chronic microvascular ischemic disease.  MRI C-spine: normal cervical spinal cord.  2D Echo: 60-65%, atria normal in size LDL 145 HgbA1c 6.4 VTE prophylaxis - SCDs No  antithrombotic prior to admission, now on aspirin  81 mg daily Therapy recommendations:  Pending Disposition:  pending  Seizure Initial R gaze deviation was suspicious for seizure activity given the location of the stroke. Routine EEG: Negative for seizure Continuous video EEG day 1: epileptogenicity arising from L centro-temporo-parietal region. Day 2: no activity, dc'd longterm monitoring. Keppra  500mg  BID, continue  Atrial fibrillation - no diagnosis Continue telemetry monitoring  Hypertension Home meds:  Norvasc , Propranolol  Unstable Long-term BP goal: 130/90  Hyperlipidemia Home meds:  Crestor  40mg  daily, resumed in hospital LDL 145, goal < 70 Continue statin at discharge  Diabetes type II Controlled Home meds:  Metformin  HgbA1c 6.4, goal < 7.0 CBGs SSI Recommend close follow-up with PCP for better DM control  Substance Abuse UDS pending  Dysphagia Patient has post-stroke dysphagia, SLP consulted    Diet   Diet NPO time specified  Advance diet as tolerated  Hospital day # 5  Thedford Homans, MD Vascular Neurology

## 2024-07-31 DIAGNOSIS — G9340 Encephalopathy, unspecified: Secondary | ICD-10-CM | POA: Diagnosis not present

## 2024-07-31 DIAGNOSIS — I639 Cerebral infarction, unspecified: Secondary | ICD-10-CM | POA: Diagnosis not present

## 2024-07-31 DIAGNOSIS — R569 Unspecified convulsions: Secondary | ICD-10-CM | POA: Diagnosis not present

## 2024-07-31 LAB — GLUCOSE, CAPILLARY
Glucose-Capillary: 132 mg/dL — ABNORMAL HIGH (ref 70–99)
Glucose-Capillary: 143 mg/dL — ABNORMAL HIGH (ref 70–99)
Glucose-Capillary: 179 mg/dL — ABNORMAL HIGH (ref 70–99)
Glucose-Capillary: 230 mg/dL — ABNORMAL HIGH (ref 70–99)
Glucose-Capillary: 277 mg/dL — ABNORMAL HIGH (ref 70–99)

## 2024-07-31 LAB — BASIC METABOLIC PANEL WITH GFR
Anion gap: 11 (ref 5–15)
BUN: 10 mg/dL (ref 8–23)
CO2: 25 mmol/L (ref 22–32)
Calcium: 9.3 mg/dL (ref 8.9–10.3)
Chloride: 102 mmol/L (ref 98–111)
Creatinine, Ser: 0.61 mg/dL (ref 0.44–1.00)
GFR, Estimated: >60 mL/min
Glucose, Bld: 185 mg/dL — ABNORMAL HIGH (ref 70–99)
Potassium: 4.1 mmol/L (ref 3.5–5.1)
Sodium: 138 mmol/L (ref 135–145)

## 2024-07-31 LAB — CBC
HCT: 32.8 % — ABNORMAL LOW (ref 36.0–46.0)
Hemoglobin: 11.2 g/dL — ABNORMAL LOW (ref 12.0–15.0)
MCH: 32.2 pg (ref 26.0–34.0)
MCHC: 34.1 g/dL (ref 30.0–36.0)
MCV: 94.3 fL (ref 80.0–100.0)
Platelets: 256 K/uL (ref 150–400)
RBC: 3.48 MIL/uL — ABNORMAL LOW (ref 3.87–5.11)
RDW: 11.8 % (ref 11.5–15.5)
WBC: 8.7 K/uL (ref 4.0–10.5)
nRBC: 1.2 % — ABNORMAL HIGH (ref 0.0–0.2)

## 2024-07-31 LAB — CULTURE, BLOOD (ROUTINE X 2)
Culture: NO GROWTH
Culture: NO GROWTH
Special Requests: ADEQUATE

## 2024-07-31 LAB — PHOSPHORUS: Phosphorus: 2.9 mg/dL (ref 2.5–4.6)

## 2024-07-31 LAB — MAGNESIUM: Magnesium: 2.2 mg/dL (ref 1.7–2.4)

## 2024-07-31 MED ORDER — ONDANSETRON HCL 4 MG/2ML IJ SOLN
4.0000 mg | Freq: Four times a day (QID) | INTRAMUSCULAR | Status: DC | PRN
  Administered 2024-07-31 – 2024-08-02 (×3): 4 mg via INTRAVENOUS
  Filled 2024-07-31 (×3): qty 2

## 2024-07-31 MED ORDER — PROCHLORPERAZINE EDISYLATE 10 MG/2ML IJ SOLN
5.0000 mg | Freq: Once | INTRAMUSCULAR | Status: AC
  Administered 2024-07-31: 5 mg via INTRAVENOUS
  Filled 2024-07-31: qty 2

## 2024-07-31 NOTE — Progress Notes (Signed)
 [facility]  PROGRESS NOTE  Carolyn Hunter FMW:[id number] DOB: 1951-08-08 DOA: 07/25/2024 PCP: Orpha Yancey LABOR, MD   Brief HPI:    73 yo F w/ pertinent PMH HTN, HLD, T2DM brought in to [facility] on 12/15 w/ acute encephalopathy.   On 12/15 patient found down on ground. Apparently fell yesterday. Last known well unclear.   Not protecting airway consistently in ER so intubated for airway protection.  Was potentially moving left side less so stroke Cts performed which were neg.      Assessment/Plan:   Acute left frontotemporal ischemic stroke Seizures  - Stroke team following - Rosuvastatin  40 mg daily - Aspirin  81mg  - Echo with no acute abnormality - EEG 12/17 with epileptogenicity arising from the left central temporoparietal region. EEG 12/18 w/ moderate diffuse encephalopathy.  - Keppra  500 mg BID   Right hand swelling/ecchymosis -X-ray of right forearm from 07/25/2024 was unremarkable for acute fracture -Continue cold compresses    Acute hypoxic respiratory failure in the setting of altered mental status and inability to protect airway Right basilar opacity likely atelectasis versus aspiration pneumonia - Extubated 12/19 - Wean supplemental O2 for goal >88% - Completed ceftriaxone  x 5 days for suspected aspiration pneumonia.   Nutrition Diet: Continue tube feeds, plan to exchange to CorTrak today  GI PPX: Continue Protonix  daily Bowel: PRN Miralax  and colace, LBM 12/18    Right basilar opacity concerning for aspiration pneumonia Asymptomatic bacteriuria - Ceftriaxone  for 5 days - Blood cultures 12/15 w/ aerococcus viridans in 1 bottle, suspect contaminant, 12/16 Bcx NGTD   Diabetes mellitus type 2 A1c 6.4. CBG this AM 170.  -Lantus  8 units daily  - Continue sliding scale insulin  with NovoLog     Normocytic anemia Hgb 9.6 (9.7). Noted blood during ETT exchange and bronch w/o acute bleed. No other signs of bleed. - Trend CBC    DVT ppx : Lovenox  daily     MSK/other  Multiple bruises with right eye ecchymosis CT head w/ R lateral periorbital hematoma CT C-spine with no acute fracture        DVT prophylaxis: Lovenox   Medications     amLODipine   10 mg Per Tube Daily   aspirin   81 mg Per Tube Daily   Chlorhexidine  Gluconate Cloth  6 each Topical Daily   enoxaparin  (LOVENOX ) injection  40 mg Subcutaneous Q24H   fiber  1 packet Per Tube BID   insulin  aspart  0-20 Units Subcutaneous Q4H   insulin  glargine  12 Units Subcutaneous Daily   levETIRAcetam   500 mg Per Tube BID   mouth rinse  15 mL Mouth Rinse 4 times per day   rosuvastatin   40 mg Per Tube Daily     Data Reviewed:   CBG:  Recent Labs  Lab 07/30/24 1104 07/30/24 1559 07/30/24 2041 07/30/24 2329 07/31/24 0410  GLUCAP 212* 183* 136* 114* 179*    SpO2: 98 % O2 Flow Rate (L/min): 2 L/min FiO2 (%): 36 %    Vitals:   07/30/24 2331 07/31/24 0355 07/31/24 0439 07/31/24 0811  BP: (!) 158/71 (!) 155/81    Pulse: 87 94  93  Resp: (!) 23 (!) 24    Temp: 98.4 F (36.9 C) 98.6 F (37 C)  98.6 F (37 C)  TempSrc: Axillary Axillary  Oral  SpO2: 100% 95%  98%  Weight:   67.8 kg   Height:          Data Reviewed:  Basic Metabolic Panel: Recent Labs  Lab  07/27/24 0302 07/28/24 0530 07/29/24 0427 07/30/24 0351 07/31/24 0515  NA 134* 139 140 140 138  K 4.3 4.0 4.3 4.2 4.1  CL 104 105 105 105 102  CO2 22 28 28 28 25   GLUCOSE 148* 150* 209* 177* 185*  BUN 15 15 15 13 10   CREATININE 0.88 0.75 0.66 0.66 0.61  CALCIUM  8.8* 8.9 9.1 8.9 9.3  MG 2.5* 2.4 2.3 2.0 2.2  PHOS 2.0* 2.5 2.4* 2.8 2.9    CBC: Recent Labs  Lab 07/25/24 1324 07/25/24 1336 07/27/24 0302 07/28/24 0530 07/29/24 0427 07/30/24 0351 07/31/24 0515  WBC 13.5*   < > 11.4* 8.9 7.5 9.1 8.7  NEUTROABS 11.2*  --   --   --   --   --   --   HGB 13.7   < > 11.0* 9.7* 9.6* 9.8* 11.2*  HCT 39.9   < > 32.0* 29.6* 28.6* 29.5* 32.8*  MCV 94.1   < > 95.5 97.4 96.3 95.8 94.3  PLT 239   < > 168  165 190 235 256   < > = values in this interval not displayed.    LFT Recent Labs  Lab 07/25/24 1324  AST 48*  ALT 19  ALKPHOS 70  BILITOT 1.2  PROT 7.5  ALBUMIN 3.7     Antibiotics: Anti-infectives (From admission, onward)    Start     Dose/Rate Route Frequency Ordered Stop   07/26/24 1300  cefTRIAXone  (ROCEPHIN ) 2 g in sodium chloride  0.9 % 100 mL IVPB        2 g 200 mL/hr over 30 Minutes Intravenous Every 24 hours 07/26/24 0819 07/30/24 1236   07/25/24 2200  piperacillin -tazobactam (ZOSYN ) IVPB 3.375 g  Status:  Discontinued        3.375 g 12.5 mL/hr over 240 Minutes Intravenous Every 8 hours 07/25/24 1642 07/26/24 0819   07/25/24 1400  ceFEPIme  (MAXIPIME ) 2 g in sodium chloride  0.9 % 100 mL IVPB        2 g 200 mL/hr over 30 Minutes Intravenous  Once 07/25/24 1355 07/25/24 1446   07/25/24 1400  metroNIDAZOLE  (FLAGYL ) IVPB 500 mg        500 mg 100 mL/hr over 60 Minutes Intravenous  Once 07/25/24 1355 07/25/24 1542   07/25/24 1400  vancomycin  (VANCOCIN ) IVPB 1000 mg/200 mL premix  Status:  Discontinued        1,000 mg 200 mL/hr over 60 Minutes Intravenous  Once 07/25/24 1355 07/25/24 1357   07/25/24 1400  vancomycin  (VANCOREADY) IVPB 1250 mg/250 mL        1,250 mg 166.7 mL/hr over 90 Minutes Intravenous  Once 07/25/24 1357 07/25/24 1720        CONSULTS   Code Status: Full code  Family Communication: No family at bedside     Subjective   Swelling of the right hand and wrist   Objective    Physical Examination:   Appears in no acute distress S1-S2, regular Lungs clear to auscultation bilaterally Abdomen soft, nontender, no organomegaly Extremities-right hand is swollen, small lesion on the right wrist, warm to touch            Carolyn Hunter S Carolyn Hunter   [facility] If 7PM-7AM, please contact night-coverage at [email or web address], Office  [phone]   07/31/2024, 10:26 AM  LOS: 6 days

## 2024-07-31 NOTE — Evaluation (Signed)
 Occupational Therapy Evaluation Patient Details Name: Carolyn Hunter MRN: [id number] DOB: 1951-04-25 Today's Date: 07/31/2024   History of Present Illness   73 year old female who presented to the hospital 12/15 with fall, right-sided weakness, encephalopathy. Noted to have small left frontotemporal stroke on her MRI. Intubated 12/15-12/19. PMH: hypertension, hyperlipidemia, type 2 diabetes.     Clinical Impressions Pt seen this AM for OT evaluation, pleasant, awake, and very cooperative. AOX3, re-oriented to situation with improving recall and retention as session progressed. PTA, pt was highly active and indep with ADLs, IADLs and mobility without AD. She presented today with +2 edema in RUE (distal>proximal), required total A for LB ADLs and min/mod A for UB ADLs. Min A for transfers and short mobility in room via RW (pt with difficulty maintaining ample grip on RW - OT assisting). VSS; impulsive but redirectable.  Pt is currently functioning below baseline and would benefit from ongoing acute OT services to progress towards safe discharge and to facilitate return to prior level of function. Current recommendation is high-intensity post-acute rehab (> 3 hours/day).     If plan is discharge home, recommend the following:   A lot of help with walking and/or transfers;Two people to help with bathing/dressing/bathroom;Assistance with feeding;Assistance with cooking/housework;Direct supervision/assist for medications management;Direct supervision/assist for financial management;Assist for transportation;Help with stairs or ramp for entrance;Supervision due to cognitive status     Functional Status Assessment   Patient has had a recent decline in their functional status and demonstrates the ability to make significant improvements in function in a reasonable and predictable amount of time.     Equipment Recommendations   Other (comment) (defer to next level of care)      Recommendations for Other Services   Rehab consult     Precautions/Restrictions   Precautions Precautions: Fall Recall of Precautions/Restrictions: Impaired Restrictions Weight Bearing Restrictions Per Provider Order: No     Mobility Bed Mobility Overal bed mobility: Needs Assistance Bed Mobility: Supine to Sit, Sit to Supine     Supine to sit: Min assist, HOB elevated, Used rails Sit to supine: Contact guard assist, HOB elevated, Used rails   General bed mobility comments: reliant on bed features, exited to the R side    Transfers Overall transfer level: Needs assistance Equipment used: Rolling walker (2 wheels) Transfers: Sit to/from Stand Sit to Stand: Min assist           General transfer comment: Stood from bed with extensive VC for proper hand placement given RUE edema, needs A to maintain R UE grip on RW and for navigation/mgmt of RW in room around obstacles      Balance Overall balance assessment: Needs assistance Sitting-balance support: Single extremity supported, Bilateral upper extremity supported, Feet supported Sitting balance-Leahy Scale: Poor Sitting balance - Comments: requiring up to min A to maintain, often leaning to the R side, cues to maintain upright and B feet on floor for improved stability Postural control: Right lateral lean Standing balance support: Single extremity supported, Reliant on assistive device for balance, Bilateral upper extremity supported, During functional activity Standing balance-Leahy Scale: Poor Standing balance comment: reliant on RW, OT assisting wiht maintianing grip on R hand on RW 2/2 edema                           ADL either performed or assessed with clinical judgement   ADL Overall ADL's : Needs assistance/impaired Eating/Feeding: Moderate assistance;Bed level Eating/Feeding Details (indicate cue  type and reason): dobhoff intact, approved for modified diet per SLP recs Grooming: Moderate  assistance;Standing;Wash/dry face Grooming Details (indicate cue type and reason): cog cues for sequencing, assist for stability when grasping/releasing and reaching for washcloth from tray table         Upper Body Dressing : Moderate assistance   Lower Body Dressing: Maximal assistance       Toileting- Clothing Manipulation and Hygiene: Total assistance Toileting - Clothing Manipulation Details (indicate cue type and reason): pt incontinent of bowel on arrival, needs total A for peri hygiene and to prevent pt from placing hand into stool     Functional mobility during ADLs: Minimal assistance;Moderate assistance;Rolling walker (2 wheels)       Vision Baseline Vision/History: 1 Wears glasses (readers) Ability to See in Adequate Light: 0 Adequate Patient Visual Report: Blurring of vision (worse than normal per pt)       Perception         Praxis         Pertinent Vitals/Pain Pain Assessment Pain Assessment: No/denies pain     Extremity/Trunk Assessment Upper Extremity Assessment Upper Extremity Assessment: Right hand dominant;RUE deficits/detail RUE Deficits / Details: RUE grossly 3-/5 proximally, 2/5 grip strength limited 2/2 +3 edema (distal>proximal)   Lower Extremity Assessment Lower Extremity Assessment: Defer to PT evaluation       Communication Communication Communication: Impaired Factors Affecting Communication: Reduced clarity of speech   Cognition Arousal: Alert Behavior During Therapy: Impulsive Cognition: Cognition impaired   Orientation impairments: Situation (zero recall of reason for hospitalization; re-oriented appropriately) Awareness: Intellectual awareness impaired Memory impairment (select all impairments): Short-term memory, Working memory Attention impairment (select first level of impairment): Sustained attention Executive functioning impairment (select all impairments): Organization, Problem solving OT - Cognition Comments:  impulsive and with reduced safety insight, although recall and retention as session progressed                 Following commands: Impaired Following commands impaired: Follows one step commands with increased time     Cueing  General Comments   Cueing Techniques: Verbal cues;Gestural cues;Tactile cues;Visual cues  VSS throughout   Exercises     Shoulder Instructions      Home Living Family/patient expects to be discharged to:: Private residence Living Arrangements: Spouse/significant other Available Help at Discharge: Family;Available 24 hours/day Type of Home: House Home Access: Stairs to enter [employer] of Steps: 1 Entrance Stairs-Rails: None Home Layout: Two level;Able to live on main level with bedroom/bathroom     Bathroom Shower/Tub: Chief Strategy Officer: Standard     Home Equipment: Medical Laboratory Scientific Officer - single point;BSC/3in1      Lives With: Spouse    Prior Functioning/Environment Prior Level of Function : Independent/Modified Independent;Driving             Mobility Comments: no AD PTA ADLs Comments: indep with ADLs, + driving, normally very active at baseline    OT Problem List: Decreased strength;Decreased range of motion;Decreased activity tolerance;Impaired balance (sitting and/or standing);Decreased coordination;Decreased cognition;Decreased safety awareness;Impaired UE functional use;Increased edema   OT Treatment/Interventions: Self-care/ADL training;Therapeutic exercise;Energy conservation;DME and/or AE instruction;Therapeutic activities;Cognitive remediation/compensation;Patient/family education;Balance training      OT Goals(Current goals can be found in the care plan section)   Acute Rehab OT Goals Patient Stated Goal: get stronger and get back to PLOF OT Goal Formulation: With patient Time For Goal Achievement: 08/14/24 Potential to Achieve Goals: Good   OT Frequency:  Min 2X/week    Co-evaluation  AM-PAC OT 6 Clicks Daily Activity     Outcome Measure Help from another person eating meals?: A Lot Help from another person taking care of personal grooming?: A Little Help from another person toileting, which includes using toliet, bedpan, or urinal?: Total Help from another person bathing (including washing, rinsing, drying)?: A Lot Help from another person to put on and taking off regular upper body clothing?: A Lot Help from another person to put on and taking off regular lower body clothing?: A Lot 6 Click Score: 12   End of Session Equipment Utilized During Treatment: Gait belt;Rolling walker (2 wheels) Nurse Communication: Mobility status  Activity Tolerance: Patient tolerated treatment well Patient left: in bed;with call bell/phone within reach;with bed alarm set  OT Visit Diagnosis: Unsteadiness on feet (R26.81);Other abnormalities of gait and mobility (R26.89);Muscle weakness (generalized) (M62.81);Other symptoms and signs involving cognitive function                Time: 9078-9048 OT Time Calculation (min): 30 min Charges:  OT General Charges $OT Visit: 1 Visit OT Evaluation $OT Eval Moderate Complexity: 1 Mod  Trease Bremner M. [place], OTR/L [facility] Services [phone] Secure Chat Preferred  Coltyn Hanning 07/31/2024, 11:16 AM

## 2024-07-31 NOTE — Progress Notes (Signed)
 Speech Language Pathology Treatment: Dysphagia  Patient Details Name: Carolyn Hunter MRN: [id number] DOB: January 13, 1951 Today's Date: 07/31/2024 Time: 9164-9149 SLP Time Calculation (min) (ACUTE ONLY): 15 min  Assessment / Plan / Recommendation Clinical Impression  Patient with improved swallowing and cognitive function today. Appropriate to resume a po diet with recommendations as noted below (dys 3, thin liquid). Patient may require set up assistance with meal trays. SLP to f/u for diet tolerance and potential to advance.   Diagnostic treatment provided with focus on diet advancement. Patient alert and cooperative, oriented to person, place, and time with min cues required for orientation to situation. Able to attend to task appropriately for 15 minutes, asking appropriate questions. Patient able to self feed, using left hand, clinician provided po trials. Consumed all solids and liquids provided without overt indication of aspiration. Oral phase mildly prolonged and patient with evidence of a possible intermittent delay in swallow initiation, likely due to tremors which based on notes, are baseline. Patient unable to clarify but she did report that she had not difficulty swallowing prior to admission. Initiating a slightly conservative diet of dysphagia 3 given above with prognosis good for advancement back to regular texture solids pending tolerance of current diet as suspect that current oral function is baseline and related to tremors rather than acute deficits.  Family contacted via phone to clarify but unavailable. OME negative for any focal oral weakness.    HPI HPI: 73 year old female who presented to the hospital 12/15 with fall, right-sided weakness, encephalopathy. Noted to have small left frontotemporal stroke on her MRI. Intubated 12/15-12/19. PMH: hypertension, hyperlipidemia, type 2 diabetes.      SLP Plan  Goals updated        Swallow Evaluation Recommendations    Recommendations: PO diet PO Diet Recommendation: Dysphagia 3 (Mechanical soft);Thin liquids (Level 0) Liquid Administration via: Cup;Straw Medication Administration: Whole meds with puree Supervision: Patient able to self-feed;Staff to assist with self-feeding;Intermittent supervision/cueing for swallowing strategies (assistance with feeding as needed) Postural changes: Position pt fully upright for meals Oral care recommendations: Oral care BID (2x/day)     Recommendations                     Oral care BID   Frequent or constant Supervision/Assistance Dysphagia, unspecified (R13.10)     Goals updated    Carolyn Hunter, CCC-SLP  Jinna Weinman Meryl  07/31/2024, 8:56 AM

## 2024-07-31 NOTE — Plan of Care (Signed)

## 2024-07-31 NOTE — Plan of Care (Signed)
" °  Problem: Clinical Measurements: Goal: Ability to maintain clinical measurements within normal limits will improve Outcome: Progressing Goal: Will remain free from infection Outcome: Progressing Goal: Diagnostic test results will improve Outcome: Progressing Goal: Respiratory complications will improve Outcome: Progressing Goal: Cardiovascular complication will be avoided Outcome: Progressing   Problem: Elimination: Goal: Will not experience complications related to bowel motility Outcome: Progressing Goal: Will not experience complications related to urinary retention Outcome: Progressing   Problem: Pain Managment: Goal: General experience of comfort will improve and/or be controlled Outcome: Progressing   Problem: Safety: Goal: Ability to remain free from injury will improve Outcome: Progressing   Problem: Skin Integrity: Goal: Risk for impaired skin integrity will decrease Outcome: Progressing   Problem: Coping: Goal: Will verbalize positive feelings about self Outcome: Progressing Goal: Will identify appropriate support needs Outcome: Progressing   "

## 2024-08-01 ENCOUNTER — Other Ambulatory Visit: Payer: Self-pay

## 2024-08-01 DIAGNOSIS — I69359 Hemiplegia and hemiparesis following cerebral infarction affecting unspecified side: Secondary | ICD-10-CM

## 2024-08-01 DIAGNOSIS — I639 Cerebral infarction, unspecified: Secondary | ICD-10-CM | POA: Diagnosis not present

## 2024-08-01 DIAGNOSIS — I63312 Cerebral infarction due to thrombosis of left middle cerebral artery: Secondary | ICD-10-CM

## 2024-08-01 DIAGNOSIS — G9340 Encephalopathy, unspecified: Secondary | ICD-10-CM | POA: Diagnosis not present

## 2024-08-01 LAB — GLUCOSE, CAPILLARY
Glucose-Capillary: 152 mg/dL — ABNORMAL HIGH (ref 70–99)
Glucose-Capillary: 161 mg/dL — ABNORMAL HIGH (ref 70–99)
Glucose-Capillary: 135 mg/dL — ABNORMAL HIGH (ref 70–99)
Glucose-Capillary: 180 mg/dL — ABNORMAL HIGH (ref 70–99)
Glucose-Capillary: 189 mg/dL — ABNORMAL HIGH (ref 70–99)

## 2024-08-01 LAB — BASIC METABOLIC PANEL WITH GFR
Anion gap: 9 (ref 5–15)
BUN: 14 mg/dL (ref 8–23)
CO2: 27 mmol/L (ref 22–32)
Calcium: 9.3 mg/dL (ref 8.9–10.3)
Chloride: 102 mmol/L (ref 98–111)
Creatinine, Ser: 0.76 mg/dL (ref 0.44–1.00)
GFR, Estimated: >60 mL/min
Glucose, Bld: 128 mg/dL — ABNORMAL HIGH (ref 70–99)
Potassium: 4.2 mmol/L (ref 3.5–5.1)
Sodium: 138 mmol/L (ref 135–145)

## 2024-08-01 LAB — PHOSPHORUS: Phosphorus: 3.5 mg/dL (ref 2.5–4.6)

## 2024-08-01 LAB — MAGNESIUM: Magnesium: 2.1 mg/dL (ref 1.7–2.4)

## 2024-08-01 LAB — CBC
HCT: 32.7 % — ABNORMAL LOW (ref 36.0–46.0)
Hemoglobin: 11.1 g/dL — ABNORMAL LOW (ref 12.0–15.0)
MCH: 32.1 pg (ref 26.0–34.0)
MCHC: 33.9 g/dL (ref 30.0–36.0)
MCV: 94.5 fL (ref 80.0–100.0)
Platelets: 276 K/uL (ref 150–400)
RBC: 3.46 MIL/uL — ABNORMAL LOW (ref 3.87–5.11)
RDW: 11.9 % (ref 11.5–15.5)
WBC: 10.7 K/uL — ABNORMAL HIGH (ref 4.0–10.5)
nRBC: 0.7 % — ABNORMAL HIGH (ref 0.0–0.2)

## 2024-08-01 MED ORDER — ACETAMINOPHEN 325 MG PO TABS
650.0000 mg | ORAL_TABLET | Freq: Four times a day (QID) | ORAL | Status: DC | PRN
  Administered 2024-08-01: 650 mg
  Filled 2024-08-01: qty 2

## 2024-08-01 MED ORDER — BANATROL TF EN LIQD
60.0000 mL | Freq: Two times a day (BID) | ENTERAL | Status: DC
  Administered 2024-08-01 – 2024-08-02 (×2): 60 mL
  Filled 2024-08-01 (×2): qty 60

## 2024-08-01 MED ORDER — ENSURE PLUS HIGH PROTEIN PO LIQD: 237.0000 mL | Freq: Two times a day (BID) | ORAL | Status: DC

## 2024-08-01 MED ORDER — ACETAMINOPHEN 325 MG PO TABS: 650.0000 mg | ORAL_TABLET | Freq: Four times a day (QID) | ORAL | Status: DC | PRN

## 2024-08-01 MED ORDER — GLUCERNA 1.5 CAL PO LIQD
1000.0000 mL | ORAL | Status: DC
  Administered 2024-08-02 (×2): 1000 mL
  Filled 2024-08-01 (×2): qty 1000

## 2024-08-01 NOTE — Progress Notes (Signed)
 Nutrition Follow-up  DOCUMENTATION CODES:  Not applicable  INTERVENTION:  Continue tube feeding via cortrak: Glucerna 1.5 at 40 ml/h (960 ml per day) Provides 1440 kcal, 79 gm protein, 729 ml free water  daily Banatrol BID-provides 45kcal, 5g soluble fiber and 2g protein per serving. Continue current diet as ordered Encourage PO intake Ensure Plus High Protein po BID with meals, each supplement provides 350 kcal and 20 grams of protein   NUTRITION DIAGNOSIS:  Inadequate oral intake related to inability to eat as evidenced by NPO status. - remains applicable  GOAL:  Patient will meet greater than or equal to 90% of their needs - progressing  MONITOR:  I & O's, Vent status, Labs  REASON FOR ASSESSMENT:  Consult, Ventilator Enteral/tube feeding initiation and management  ASSESSMENT:  Pt with hx of HTN, HLD, and DM type 2 presented to ED after being found down at home unresponsive.   12/15 - presented to ED, intubated  12/19 - cortrak placed, extubated 12/20 - SLP BSE, NPO 12/21 - SLP BSE, DYS3, thin  Pt resting in bed at the time of assessment. Son and husband at bedside. Pt looks much improved from last assessment when in ICU. Pt awake and talkative. Pt with nausea overnight and this AM. States that she thinks she may have eaten something that didn't agree with her. Was tolerating TF for several days prior to developing nausea. Pt reports that she had yogurt, applesauce, and jello for lunch. Will adjust TF goal rate to account for pt's intake. Would like to see pt able to tolerate a little bit of food at 3 consecutive meals before reducing amount of TF that pt is receiving.   Of note, CIR authorization pending  Admit weight: 64.9 kg  Current weight: 67.8 kg    Intake/Output Summary (Last 24 hours) at 08/01/2024 1626 Last data filed at 08/01/2024 0500 Gross per 24 hour  Intake --  Output 1000 ml  Net -1000 ml  Net IO Since Admission: 1,374.91 mL [08/01/24  1626]  Drains/Lines: cortrak UOP x 24 hours  Nutritionally Relevant Medications: Scheduled Meds:  fiber  1 packet Per Tube BID   insulin  aspart  0-20 Units Subcutaneous Q4H   insulin  glargine  12 Units Subcutaneous Daily   Continuous Infusions:  feeding supplement (GLUCERNA 1.5 CAL) 15 mL/hr at 08/01/24 1457   PRN Meds: docusate, ondansetron , polyethylene glycol  Labs Reviewed: CBG ranges from 132-277 mg/dL over the last 24 hours HgbA1c 6.4% (12/15)  NUTRITION - FOCUSED PHYSICAL EXAM: Flowsheet Row Most Recent Value  Orbital Region No depletion  Upper Arm Region Mild depletion  [right side only]  Thoracic and Lumbar Region No depletion  Buccal Region No depletion  Temple Region No depletion  Clavicle Bone Region No depletion  Clavicle and Acromion Bone Region No depletion  Scapular Bone Region No depletion  Dorsal Hand Unable to assess  [mittens]  Patellar Region Mild depletion  Anterior Thigh Region No depletion  Posterior Calf Region No depletion  Edema (RD Assessment) None  Hair Reviewed  Eyes Reviewed  Mouth Reviewed  Skin Reviewed  Nails Reviewed    Diet Order:   Diet Order             DIET DYS 3 Fluid consistency: Thin  Diet effective now                   EDUCATION NEEDS:  Education needs have been addressed  Skin:  Skin Assessment: Reviewed RN Assessment  Last  BM:  12/22 - type 7  Height:  Ht Readings from Last 1 Encounters:  07/25/24 5' 3 (1.6 m)    Weight:  Wt Readings from Last 1 Encounters:  07/31/24 67.8 kg    Ideal Body Weight:  52.3 kg  BMI:  Body mass index is 26.48 kg/m.  Estimated Nutritional Needs:  Kcal:  1500-1700 kcal/d Protein:  75-90 g/d Fluid:  >/=1.5L/d    Vernell Lukes, RD, LDN, CNSC Registered Dietitian II Please reach out via secure chat

## 2024-08-01 NOTE — Progress Notes (Signed)
 Inpatient Rehab Coordinator Note:  I spoke with patient's daughter, [name], over the phone to discuss CIR recommendations and goals/expectations of CIR stay.  We reviewed 3 hrs/day of therapy, physician follow up, and average length of stay 2 weeks (dependent upon progress) with goals of supervision 24/7.  [name] reports that she will be moving in with pt at discharge and plans to take a LOA from work.  Other supports include pt's son and pt's granddaughter as well.  We reviewed insurance auth process and I will start that today, once PMR consult noted.    Reche Lowers, PT, DPT Admissions Coordinator [phone] 08/01/2024 11:24 AM

## 2024-08-01 NOTE — Progress Notes (Signed)
 SLP Cancellation Note  Patient Details Name: Carolyn Hunter MRN: [id number] DOB: 11-Aug-1951   Cancelled treatment:       Reason Eval/Treat Not Completed: Patient declined, no reason specified; pt nauseous/vomiting; decline po intake; ST will continue efforts as schedule permits.   Pat Tiquan Bouch,M.S.,CCC-SLP 08/01/2024, 8:59 AM

## 2024-08-01 NOTE — Plan of Care (Signed)
  Problem: Education: Goal: Knowledge of General Education information will improve Description: Including pain rating scale, medication(s)/side effects and non-pharmacologic comfort measures Outcome: Progressing   Problem: Health Behavior/Discharge Planning: Goal: Ability to manage health-related needs will improve Outcome: Progressing   Problem: Clinical Measurements: Goal: Will remain free from infection Outcome: Progressing   Problem: Clinical Measurements: Goal: Respiratory complications will improve Outcome: Progressing   

## 2024-08-01 NOTE — Plan of Care (Signed)

## 2024-08-01 NOTE — Progress Notes (Addendum)
 0830: Pt nauseated this AM. Having several large liquid BM's since yesterday. Zofran  given. Tube feeding held. Will restart at 12 at a low rate and titrate up to goal.  1130: Pt still a little nauseated. Spoke with her and the family about continuing to hold for another 2 hours and reassess how she feels, and they agree with the plan. Offered more nausea medication if the nausea worsens, but pt stated she was okay at the moment.

## 2024-08-01 NOTE — Progress Notes (Signed)
 [facility]  PROGRESS NOTE  Carolyn Hunter FMW:[id number] DOB: Aug 09, 1951 DOA: 07/25/2024 PCP: Orpha Yancey LABOR, MD   Brief HPI:    73 yo F w/ pertinent PMH HTN, HLD, T2DM brought in to [facility] on 12/15 w/ acute encephalopathy.   On 12/15 patient found down on ground. Apparently fell yesterday. Last known well unclear.   Not protecting airway consistently in ER so intubated for airway protection.  Was potentially moving left side less so stroke Cts performed which were neg.      Assessment/Plan:   Acute left frontotemporal ischemic stroke Seizures  - Stroke team following - Rosuvastatin  40 mg daily - Aspirin  81mg  - Echo with no acute abnormality - EEG 12/17 with epileptogenicity arising from the left central temporoparietal region. EEG 12/18 w/ moderate diffuse encephalopathy.  - Keppra  500 mg BID   Right hand swelling/ecchymosis -Improved -X-ray of right forearm from 07/25/2024 was unremarkable for acute fracture -Continue cold compresses    Acute hypoxic respiratory failure in the setting of altered mental status and inability to protect airway Right basilar opacity likely atelectasis versus aspiration pneumonia - Extubated 12/19 - Wean supplemental O2 for goal >88% - Completed ceftriaxone  x 5 days for suspected aspiration pneumonia.   Nutrition Diet: Continue tube feeds, plan to exchange to [employer] today  GI PPX: Continue Protonix  daily Bowel: PRN Miralax  and colace, LBM 12/18    Right basilar opacity concerning for aspiration pneumonia Asymptomatic bacteriuria - Ceftriaxone  for 5 days - Blood cultures 12/15 w/ aerococcus viridans in 1 bottle, suspect contaminant, 12/16 Bcx NGTD   Diabetes mellitus type 2 A1c 6.4. CBG this AM 170.  -Lantus  8 units daily  - Continue sliding scale insulin  with NovoLog     Normocytic anemia Hgb 9.6 (9.7). Noted blood during ETT exchange and bronch w/o acute bleed. No other signs of bleed. - Trend CBC    DVT ppx : Lovenox   daily    MSK/other  Multiple bruises with right eye ecchymosis CT head w/ R lateral periorbital hematoma CT C-spine with no acute fracture    Diarrhea - Likely in setting of laxatives and antibiotic use - Afebrile, no abdominal pain - Will monitor, no concern for C. difficile at this time    DVT prophylaxis: Lovenox   Medications     amLODipine   10 mg Per Tube Daily   aspirin   81 mg Per Tube Daily   Chlorhexidine  Gluconate Cloth  6 each Topical Daily   enoxaparin  (LOVENOX ) injection  40 mg Subcutaneous Q24H   fiber  1 packet Per Tube BID   insulin  aspart  0-20 Units Subcutaneous Q4H   insulin  glargine  12 Units Subcutaneous Daily   levETIRAcetam   500 mg Per Tube BID   mouth rinse  15 mL Mouth Rinse 4 times per day   rosuvastatin   40 mg Per Tube Daily     Data Reviewed:   CBG:  Recent Labs  Lab 07/31/24 1638 07/31/24 1943 07/31/24 2330 08/01/24 0428 08/01/24 0821  GLUCAP 277* 143* 132* 152* 161*    SpO2: 95 % O2 Flow Rate (L/min): 2 L/min FiO2 (%): 36 %    Vitals:   07/31/24 1944 07/31/24 2332 08/01/24 0429 08/01/24 0820  BP: (!) 152/75 136/72 (!) 160/78 (!) 153/75  Pulse: (!) 103 95 96 97  Resp: 18 18 18 15   Temp: 98.4 F (36.9 C) 100 F (37.8 C) 99.6 F (37.6 C) 98.6 F (37 C)  TempSrc: Oral Oral Oral Oral  SpO2: 93% 94% 95%  95%  Weight:      Height:          Data Reviewed:  Basic Metabolic Panel: Recent Labs  Lab 07/28/24 0530 07/29/24 0427 07/30/24 0351 07/31/24 0515 08/01/24 0200  NA 139 140 140 138 138  K 4.0 4.3 4.2 4.1 4.2  CL 105 105 105 102 102  CO2 28 28 28 25 27   GLUCOSE 150* 209* 177* 185* 128*  BUN 15 15 13 10 14   CREATININE 0.75 0.66 0.66 0.61 0.76  CALCIUM  8.9 9.1 8.9 9.3 9.3  MG 2.4 2.3 2.0 2.2 2.1  PHOS 2.5 2.4* 2.8 2.9 3.5    CBC: Recent Labs  Lab 07/25/24 1324 07/25/24 1336 07/28/24 0530 07/29/24 0427 07/30/24 0351 07/31/24 0515 08/01/24 0200  WBC 13.5*   < > 8.9 7.5 9.1 8.7 10.7*  NEUTROABS 11.2*   --   --   --   --   --   --   HGB 13.7   < > 9.7* 9.6* 9.8* 11.2* 11.1*  HCT 39.9   < > 29.6* 28.6* 29.5* 32.8* 32.7*  MCV 94.1   < > 97.4 96.3 95.8 94.3 94.5  PLT 239   < > 165 190 235 256 276   < > = values in this interval not displayed.    LFT Recent Labs  Lab 07/25/24 1324  AST 48*  ALT 19  ALKPHOS 70  BILITOT 1.2  PROT 7.5  ALBUMIN 3.7     Antibiotics: Anti-infectives (From admission, onward)    Start     Dose/Rate Route Frequency Ordered Stop   07/26/24 1300  cefTRIAXone  (ROCEPHIN ) 2 g in sodium chloride  0.9 % 100 mL IVPB        2 g 200 mL/hr over 30 Minutes Intravenous Every 24 hours 07/26/24 0819 07/30/24 1236   07/25/24 2200  piperacillin -tazobactam (ZOSYN ) IVPB 3.375 g  Status:  Discontinued        3.375 g 12.5 mL/hr over 240 Minutes Intravenous Every 8 hours 07/25/24 1642 07/26/24 0819   07/25/24 1400  ceFEPIme  (MAXIPIME ) 2 g in sodium chloride  0.9 % 100 mL IVPB        2 g 200 mL/hr over 30 Minutes Intravenous  Once 07/25/24 1355 07/25/24 1446   07/25/24 1400  metroNIDAZOLE  (FLAGYL ) IVPB 500 mg        500 mg 100 mL/hr over 60 Minutes Intravenous  Once 07/25/24 1355 07/25/24 1542   07/25/24 1400  vancomycin  (VANCOCIN ) IVPB 1000 mg/200 mL premix  Status:  Discontinued        1,000 mg 200 mL/hr over 60 Minutes Intravenous  Once 07/25/24 1355 07/25/24 1357   07/25/24 1400  vancomycin  (VANCOREADY) IVPB 1250 mg/250 mL        1,250 mg 166.7 mL/hr over 90 Minutes Intravenous  Once 07/25/24 1357 07/25/24 1720        CONSULTS   Code Status: Full code  Family Communication: No family at bedside     Subjective   Developed loose BMs today.  Denies abdominal pain   Objective    Physical Examination:   General-appears in no acute distress Heart-S1-S2, regular, no murmur auscultated Lungs-clear to auscultation bilaterally, no wheezing or crackles auscultated Abdomen-soft, nontender, no organomegaly Extremities-no edema in the lower  extremities Neuro-alert, oriented x3, no focal deficit noted            Carolyn Hunter S [name]   [facility] If 7PM-7AM, please contact night-coverage at [email or web address], Office  [phone]   08/01/2024,  10:13 AM  LOS: 7 days

## 2024-08-01 NOTE — Care Management Important Message (Signed)
 Important Message  Patient Details  Name: Carolyn Hunter MRN: [id number] Date of Birth: 18-Jan-1951   Important Message Given:  Yes - Medicare IM     Claretta Deed 08/01/2024, 4:12 PM

## 2024-08-01 NOTE — Consult Note (Signed)
 "     Physical Medicine and Rehabilitation Consult Reason for Consult: Functional deficits after stroke Referring Physician: Dr Drusilla   HPI: Carolyn Hunter is a 73 y.o. female with prior history of hypertension, hyperlipidemia and type 2 diabetes who was admitted to [facility] on 07/25/2024 after a fall followed by alteration of mental status.  The patient was intubated in the ED due to poor protection of airway.  The patient was evaluated by stroke team, last known well could not be determined therefore she could not receive TNK.  CT head on 07/25/2024 showed a left periorbital subcutaneous hematoma but no intracranial abnormalities.  CT of the cervical spine showed OPLL at C5-C6 as well as autofusion but no significant stenosis.  No fractures.  MRI of the brain dated 07/25/2024 demonstrated left frontal parietal small infarcts without hemorrhagic component.  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  EEG showed epileptic discharges on 07/26/2024.  Patient was loaded with Keppra .  No recurrent seizures were noted.  The patient also had a suspected aspiration pneumonia treated with ceftriaxone .  The patient was extubated 07/29/2024.  Patient was n.p.o. and followed by speech therapy.  Also speech did slums test and she scored 7/26, severe cognitive impairment. Swallowing improved and the patient was started on D3 thin liquid diet on 07/31/2024 OT evaluation on 07/31/2024 demonstrated total assist lower body ADLs min to mod assist for upper body ADLs min assist with transfers   Home: Home Living Family/patient expects to be discharged to:: Private residence Living Arrangements: Spouse/significant other Available Help at Discharge: Family, Available 24 hours/day Type of Home: House Home Access: Stairs to enter [employer] of Steps: 1 Entrance Stairs-Rails: None Home Layout: Two level, Able to live on main  level with bedroom/bathroom Bathroom Shower/Tub: Engineer, Manufacturing Systems: Standard Home Equipment: Medical Laboratory Scientific Officer - single point, BSC/3in1  Lives With: Spouse  Functional History: Prior Function Prior Level of Function : Independent/Modified Independent, Driving Mobility Comments: no AD PTA ADLs Comments: indep with ADLs, + driving, normally very active at baseline Functional Status:  Mobility: Bed Mobility Overal bed mobility: Needs Assistance Bed Mobility: Supine to Sit, Sit to Supine Supine to sit: Min assist, HOB elevated, Used rails Sit to supine: Contact guard assist, HOB elevated, Used rails General bed mobility comments: reliant on bed features, exited to the R side Transfers Overall transfer level: Needs assistance Equipment used: Rolling walker (2 wheels) Transfers: Sit to/from Stand Sit to Stand: Min assist Bed to/from chair/wheelchair/BSC transfer type:: Step pivot Step pivot transfers: Mod assist, Min assist General transfer comment: Stood from bed with extensive VC for proper hand placement given RUE edema, needs A to maintain R UE grip on RW and for navigation/mgmt of RW in room around obstacles Ambulation/Gait Ambulation/Gait assistance: Mod assist, +2 safety/equipment Gait Distance (Feet): 30 Feet Assistive device: Rolling walker (2 wheels) Gait Pattern/deviations: Step-through pattern, Decreased stride length, Shuffle, Ataxic, Drifts right/left, Trunk flexed, Wide base of support General Gait Details: Up to mod assist intermittently for balance and RW control. Cues for upright posture, proximity to device to maximize support. +2 assist for chair follow. Poor grip with RUE, needing assist to grasp intermittently to RW. No overt buckling. Further distance limited by episode of stool incontinence. Gait velocity: dec Gait velocity interpretation: <1.31 ft/sec, indicative of household ambulator Pre-gait activities: Weight shift, midline control, static march. Min assist  with RW for support.    ADL: ADL Overall  ADL's : Needs assistance/impaired Eating/Feeding: Moderate assistance, Bed level Eating/Feeding Details (indicate cue type and reason): dobhoff intact, approved for modified diet per SLP recs Grooming: Moderate assistance, Standing, Wash/dry face Grooming Details (indicate cue type and reason): cog cues for sequencing, assist for stability when grasping/releasing and reaching for washcloth from tray table Upper Body Dressing : Moderate assistance Lower Body Dressing: Maximal assistance Toileting- Clothing Manipulation and Hygiene: Total assistance Toileting - Clothing Manipulation Details (indicate cue type and reason): pt incontinent of bowel on arrival, needs total A for peri hygiene and to prevent pt from placing hand into stool Functional mobility during ADLs: Minimal assistance, Moderate assistance, Rolling walker (2 wheels)  Cognition: Cognition Overall Cognitive Status: Impaired/Different from baseline Arousal/Alertness: Lethargic Orientation Level: Oriented to person, Oriented to place, Oriented to situation Year: Other (Comment) (8044) Month: December Day of Week: Incorrect (correct given choice of two) Attention: Focused, Sustained Focused Attention: Impaired Focused Attention Impairment: Verbal basic, Functional basic Sustained Attention: Impaired Sustained Attention Impairment: Verbal basic, Functional basic Memory: Impaired Memory Impairment: Storage deficit, Retrieval deficit, Decreased recall of new information, Decreased short term memory Decreased Short Term Memory: Verbal basic, Functional basic Awareness: Impaired Awareness Impairment: Intellectual impairment, Anticipatory impairment, Emergent impairment Problem Solving: Impaired Problem Solving Impairment: Verbal basic, Functional basic Executive Function: Organizing, Reasoning Reasoning: Impaired Reasoning Impairment: Functional basic Organizing: Impaired Organizing  Impairment: Functional basic Behaviors: Perseveration Safety/Judgment: Impaired Cognition Arousal: Alert Behavior During Therapy: Impulsive Overall Cognitive Status: Impaired/Different from baseline   Review of Systems  Constitutional:  Negative for chills and fever.  HENT:  Negative for hearing loss and nosebleeds.   Eyes:  Negative for pain and discharge.  Respiratory:  Negative for cough, hemoptysis and stridor.   Cardiovascular:  Negative for chest pain and leg swelling.  Gastrointestinal:  Positive for diarrhea. Negative for nausea and vomiting.  Genitourinary:  Negative for dysuria and flank pain.  Musculoskeletal:  Negative for back pain and joint pain.  Skin:  Negative for itching and rash.  Neurological:  Positive for tremors and focal weakness.  Endo/Heme/Allergies:  Does not bruise/bleed easily.  Psychiatric/Behavioral:  Negative for substance abuse. The patient is not nervous/anxious.     Past Medical History:  Diagnosis Date   Anxiety    Cataract    OS   Diabetes mellitus without complication (HCC)    Diabetic retinopathy (HCC)    NPDR OU   Hyperlipidemia    Hypertension    Hypertensive retinopathy    OU   Tremors of nervous system    just when I get in a nervous Situation.   Past Surgical History:  Procedure Laterality Date   CATARACT EXTRACTION W/PHACO Right 10/18/2018   Procedure: CATARACT EXTRACTION PHACO AND INTRAOCULAR LENS PLACEMENT (IOC);  Surgeon: Harrie Agent, MD;  Location: [facility];  Service: Ophthalmology;  Laterality: Right;  CDE: 8.11   CATARACT EXTRACTION W/PHACO Left 01/17/2019   Procedure: CATARACT EXTRACTION PHACO AND INTRAOCULAR LENS PLACEMENT (IOC);  Surgeon: Harrie Agent, MD;  Location: [facility];  Service: Ophthalmology;  Laterality: Left;  CDE: 6.38   CESAREAN SECTION     1988   COLONOSCOPY WITH PROPOFOL  N/A 05/04/2020   Procedure: COLONOSCOPY WITH PROPOFOL ;  Surgeon: Eartha Angelia Sieving, MD;  Location: [facility];  Service:  Gastroenterology;  Laterality: N/A;  830   EYE SURGERY Right    Cataract extraction OD   POLYPECTOMY  05/04/2020   Procedure: POLYPECTOMY;  Surgeon: Eartha Angelia Sieving, MD;  Location: [facility];  Service: Gastroenterology;;  Family History  Problem Relation Age of Onset   Cancer Mother        breast and colon cancer   Breast cancer Mother    Diabetes Brother    Diabetes Son    Asthma Father        COPD   Diabetes Son    Social History:  reports that she has never smoked. She has never used smokeless tobacco. She reports that she does not drink alcohol  and does not use drugs. Allergies: Allergies[1] Medications Prior to Admission  Medication Sig Dispense Refill   amLODipine  (NORVASC ) 10 MG tablet Take 10 mg by mouth daily.     losartan  (COZAAR ) 25 MG tablet Take 25 mg by mouth daily.     metFORMIN  (GLUCOPHAGE ) 1000 MG tablet TAKE 1 TABLET BY MOUTH EVERY DAY WITH BREAKFAST (Patient taking differently: Take 1,000 mg by mouth 2 (two) times daily.) 90 tablet 1   rosuvastatin  (CRESTOR ) 5 MG tablet Take 5 mg by mouth every Monday, Wednesday, and Friday.       Blood pressure (!) 154/74, pulse 99, temperature 98.2 F (36.8 C), temperature source Oral, resp. rate 16, height 5' 3 (1.6 m), weight 67.8 kg, SpO2 92%. Physical Exam Vitals and nursing note reviewed.  Constitutional:      General: She is not in acute distress.    Appearance: She is obese.  HENT:     Head: Normocephalic and atraumatic.     Nose: No congestion or rhinorrhea.  Eyes:     Extraocular Movements: Extraocular movements intact.     Conjunctiva/sclera: Conjunctivae normal.     Pupils: Pupils are equal, round, and reactive to light.  Cardiovascular:     Rate and Rhythm: Normal rate and regular rhythm.     Heart sounds: Normal heart sounds. No murmur heard. Pulmonary:     Effort: No respiratory distress.     Breath sounds: No wheezing or rhonchi.  Abdominal:     General: There is no distension.      Tenderness: There is no abdominal tenderness. There is no guarding.  Musculoskeletal:        General: Swelling present.     Cervical back: No rigidity.     Right lower leg: No edema.     Left lower leg: No edema.     Comments: Dorsum of hand on right side 3+ pitting edema, no pain with finger or wrist ROM, no erythema   Skin:    General: Skin is warm and dry.  Neurological:     Mental Status: She is alert and oriented to person, place, and time.     Cranial Nerves: No dysarthria or facial asymmetry.     Motor: No abnormal muscle tone or seizure activity.     Coordination: Coordination abnormal. Finger-Nose-Finger Test abnormal. Impaired rapid alternating movements.     Gait: Gait abnormal and tandem walk abnormal.     Comments: Right upper extremity strength 3 - at the deltoid biceps triceps 2 - at the finger flexors and extensors as well as thumb flexor and extensor Hand intrinsics are 2 - Right lower limb is 4/5 in hip flexor knee extensor ankle dorsiflexion Left upper extremity and left lower extremity are 5/5  Sensation reduced to light touch in the 4th and 5th digits of the right hand intact in the other digits as well as in the feet and left upper extremity  Psychiatric:        Mood and Affect: Mood normal.  Behavior: Behavior normal.   Speech is dysarthric  Results for orders placed or performed during the hospital encounter of 07/25/24 (from the past 24 hours)  Glucose, capillary     Status: Abnormal   Collection Time: 07/31/24  4:38 PM  Result Value Ref Range   Glucose-Capillary 277 (H) 70 - 99 mg/dL   Comment 1 Notify RN   Glucose, capillary     Status: Abnormal   Collection Time: 07/31/24  7:43 PM  Result Value Ref Range   Glucose-Capillary 143 (H) 70 - 99 mg/dL   Comment 1 Notify RN    Comment 2 Document in Chart   Glucose, capillary     Status: Abnormal   Collection Time: 07/31/24 11:30 PM  Result Value Ref Range   Glucose-Capillary 132 (H) 70 - 99 mg/dL    Comment 1 Notify RN    Comment 2 Document in Chart   Basic metabolic panel with GFR     Status: Abnormal   Collection Time: 08/01/24  2:00 AM  Result Value Ref Range   Sodium 138 135 - 145 mmol/L   Potassium 4.2 3.5 - 5.1 mmol/L   Chloride 102 98 - 111 mmol/L   CO2 27 22 - 32 mmol/L   Glucose, Bld 128 (H) 70 - 99 mg/dL   BUN 14 8 - 23 mg/dL   Creatinine, Ser 9.23 0.44 - 1.00 mg/dL   Calcium  9.3 8.9 - 10.3 mg/dL   GFR, Estimated >39 >39 mL/min   Anion gap 9 5 - 15  CBC     Status: Abnormal   Collection Time: 08/01/24  2:00 AM  Result Value Ref Range   WBC 10.7 (H) 4.0 - 10.5 K/uL   RBC 3.46 (L) 3.87 - 5.11 MIL/uL   Hemoglobin 11.1 (L) 12.0 - 15.0 g/dL   HCT 67.2 (L) 63.9 - 53.9 %   MCV 94.5 80.0 - 100.0 fL   MCH 32.1 26.0 - 34.0 pg   MCHC 33.9 30.0 - 36.0 g/dL   RDW 88.0 88.4 - 84.4 %   Platelets 276 150 - 400 K/uL   nRBC 0.7 (H) 0.0 - 0.2 %  Magnesium      Status: None   Collection Time: 08/01/24  2:00 AM  Result Value Ref Range   Magnesium  2.1 1.7 - 2.4 mg/dL  Phosphorus     Status: None   Collection Time: 08/01/24  2:00 AM  Result Value Ref Range   Phosphorus 3.5 2.5 - 4.6 mg/dL  Glucose, capillary     Status: Abnormal   Collection Time: 08/01/24  4:28 AM  Result Value Ref Range   Glucose-Capillary 152 (H) 70 - 99 mg/dL   Comment 1 Notify RN    Comment 2 Document in Chart   Glucose, capillary     Status: Abnormal   Collection Time: 08/01/24  8:21 AM  Result Value Ref Range   Glucose-Capillary 161 (H) 70 - 99 mg/dL  Glucose, capillary     Status: Abnormal   Collection Time: 08/01/24 11:28 AM  Result Value Ref Range   Glucose-Capillary 135 (H) 70 - 99 mg/dL   No results found.  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 Yes Co-Morbidities requiring supervision/potential complications:  - Cervical spinal stenosis at C5-C6, type 2 diabetes,  hypertension Due to bladder management, bowel management, safety, skin/wound care, disease management, medication administration, pain management, and patient education, does the patient require 24 hr/day rehab nursing? Yes Does the patient require coordinated care of a physician, rehab nurse, therapy disciplines of PT, OT, speech to address physical and functional deficits in the context of the above medical diagnosis(es)? Yes Addressing deficits in the following areas: balance, endurance, locomotion, strength, transferring, bowel/bladder control, bathing, dressing, feeding, toileting, cognition, swallowing, and psychosocial support Can the patient actively participate in an intensive therapy program of at least 3 hrs of therapy per day at least 5 days per week? Yes The potential for patient to make measurable gains while on inpatient rehab is good Anticipated functional outcomes upon discharge from inpatient rehab are supervision  with PT, supervision with OT, supervision with SLP. Estimated rehab length of stay to reach the above functional goals is: 10 to 14 days Anticipated discharge destination: Home with family assistance Overall Rehab/Functional Prognosis: good  POST ACUTE RECOMMENDATIONS: This patient's condition is appropriate for continued rehabilitative care in the following setting: CIR Patient has agreed to participate in recommended program. Yes Note that insurance prior authorization may be required for reimbursement for recommended care.  Comment:    MEDICAL RECOMMENDATIONS: Consider holding tube feedings during the day so patient may have increased appetite.   I have personally performed a face to face diagnostic evaluation of this patient. Additionally, I have examined the patient's medical record including any pertinent labs and radiographic  images.    Thanks,  Prentice FORBES Compton, MD 08/01/2024      [1]  Allergies Allergen Reactions   Ace Inhibitors Cough   Zetia  [Ezetimibe ] Swelling and Other (See Comments)    Sore throat   "

## 2024-08-01 NOTE — TOC Progression Note (Signed)
 Transition of Care [facility]) - Progression Note    Patient Details  Name: Carolyn Hunter MRN: [id number] Date of Birth: November 16, 1950  [facility] CM/SW Contact  Andrez JULIANNA George, RN Phone Number: 08/01/2024, 1:09 PM  Clinical Narrative:     CIR is starting insurance auth. IP Care management following.  Expected Discharge Plan: IP Rehab Facility                 Expected Discharge Plan and Services                                               Social Drivers of Health (SDOH) Interventions SDOH Screenings   Food Insecurity: Patient Unable To Answer (07/26/2024)  Housing: Patient Unable To Answer (07/26/2024)  Transportation Needs: Patient Unable To Answer (07/26/2024)  Utilities: Patient Unable To Answer (07/26/2024)  Depression (PHQ2-9): Low Risk (08/01/2021)  Social Connections: Patient Unable To Answer (07/26/2024)  Tobacco Use: Low Risk (07/25/2024)    Readmission Risk Interventions     No data to display

## 2024-08-01 NOTE — Progress Notes (Signed)
 Physical Therapy Treatment Patient Details Name: Carolyn Hunter MRN: [id number] DOB: February 02, 1951 Today's Date: 08/01/2024   History of Present Illness 73 year old female who presented to the hospital 12/15 with fall, right-sided weakness, encephalopathy. Noted to have small left frontotemporal stroke on her MRI. Intubated 12/15-12/19. PMH: hypertension, hyperlipidemia, type 2 diabetes.    PT Comments  Pt supine in bed on arrival this session.  Pt eager to mobilize.  Remains limited this session due to weakness and bowel incontinence.  Pt required cues for posture and safety.  Pt continues to present with R side weakness, HA and increased R hand swelling.  Pt continues to benefit from rehab in a post acute setting to make functional gains and decrease care giver burden.      If plan is discharge home, recommend the following: A lot of help with walking and/or transfers;A lot of help with bathing/dressing/bathroom;Assistance with cooking/housework;Direct supervision/assist for medications management;Direct supervision/assist for financial management;Help with stairs or ramp for entrance;Assist for transportation;Supervision due to cognitive status   Can travel by private vehicle        Equipment Recommendations  Rolling walker (2 wheels)    Recommendations for Other Services Rehab consult     Precautions / Restrictions Precautions Precautions: Fall Recall of Precautions/Restrictions: Impaired Restrictions Weight Bearing Restrictions Per Provider Order: No     Mobility  Bed Mobility Overal bed mobility: Needs Assistance Bed Mobility: Supine to Sit, Sit to Supine     Supine to sit: Min assist Sit to supine: Min assist   General bed mobility comments: Min assistance for trunk assistance to rise into sitting.    Transfers Overall transfer level: Needs assistance Equipment used: Rolling walker (2 wheels) Transfers: Sit to/from Stand Sit to Stand: Min assist            General transfer comment: Cues for hand placement to and from seated surface this session.  Pt required assistance for trunk and head extension.  Presents with bowel incontinence in standing.    Ambulation/Gait Ambulation/Gait assistance: Mod assist, +2 safety/equipment, Min assist Gait Distance (Feet): 70 Feet Assistive device: None Gait Pattern/deviations: Step-through pattern, Decreased stride length, Shuffle, Ataxic, Drifts right/left, Trunk flexed, Wide base of support Gait velocity: dec     General Gait Details: did not use RW due to swollen R hand.  Pt able to progress gt distance but limited once again due to stool incontinence.   Stairs             Wheelchair Mobility     Tilt Bed    Modified Rankin (Stroke Patients Only) Modified Rankin (Stroke Patients Only) Pre-Morbid Rankin Score: No symptoms Modified Rankin: Moderately severe disability     Balance Overall balance assessment: Needs assistance Sitting-balance support: Single extremity supported, Bilateral upper extremity supported, Feet supported Sitting balance-Leahy Scale: Poor       Standing balance-Leahy Scale: Poor                              Communication Communication Communication: Impaired Factors Affecting Communication: Reduced clarity of speech  Cognition Arousal: Alert Behavior During Therapy: Impulsive   PT - Cognitive impairments: Orientation, Awareness, Memory, Attention, Sequencing, Problem solving, Safety/Judgement   Orientation impairments: Time, Situation                   PT - Cognition Comments: Oriented to month but repeatedly states her birth year when asking current year. Following commands: Impaired  Following commands impaired: Follows one step commands with increased time    Cueing Cueing Techniques: Verbal cues, Gestural cues, Tactile cues, Visual cues  Exercises      General Comments        Pertinent Vitals/Pain Pain Assessment Pain  Assessment: No/denies pain    Home Living Family/patient expects to be discharged to:: Inpatient rehab Living Arrangements: Spouse/significant other;Children                      Prior Function            PT Goals (current goals can now be found in the care plan section) Acute Rehab PT Goals Patient Stated Goal: get well, go home Potential to Achieve Goals: Good Progress towards PT goals: Progressing toward goals    Frequency    Min 3X/week      PT Plan      Co-evaluation              AM-PAC PT 6 Clicks Mobility   Outcome Measure  Help needed turning from your back to your side while in a flat bed without using bedrails?: A Little Help needed moving from lying on your back to sitting on the side of a flat bed without using bedrails?: A Little Help needed moving to and from a bed to a chair (including a wheelchair)?: A Lot Help needed standing up from a chair using your arms (e.g., wheelchair or bedside chair)?: A Lot Help needed to walk in hospital room?: A Lot Help needed climbing 3-5 steps with a railing? : Total 6 Click Score: 13    End of Session Equipment Utilized During Treatment: Gait belt Activity Tolerance: Patient tolerated treatment well Patient left: with call bell/phone within reach;with nursing/sitter in room;with family/visitor present;in bed;with bed alarm set Nurse Communication: Mobility status PT Visit Diagnosis: Unsteadiness on feet (R26.81);Other abnormalities of gait and mobility (R26.89);Muscle weakness (generalized) (M62.81);History of falling (Z91.81);Ataxic gait (R26.0);Difficulty in walking, not elsewhere classified (R26.2);Other symptoms and signs involving the nervous system (R29.898);Hemiplegia and hemiparesis Hemiplegia - Right/Left: Right Hemiplegia - dominant/non-dominant: Dominant     Time: 1702-1740 PT Time Calculation (min) (ACUTE ONLY): 38 min  Charges:    $Gait Training: 8-22 mins $Therapeutic Activity:  23-37 mins PT General Charges $$ ACUTE PT VISIT: 1 Visit                     Toya HAMS , PTA Acute Rehabilitation Services Office [phone]    Toya JINNY Gosling 08/01/2024, 5:45 PM

## 2024-08-02 ENCOUNTER — Other Ambulatory Visit (HOSPITAL_COMMUNITY): Payer: Self-pay

## 2024-08-02 ENCOUNTER — Inpatient Hospital Stay (HOSPITAL_COMMUNITY)
Admission: AD | Admit: 2024-08-02 | Discharge: 2024-08-10 | DRG: 057 | Disposition: A | Discharge status: Home / Self Care | Source: Intra-hospital | Attending: Physical Medicine & Rehabilitation | Admitting: Physical Medicine & Rehabilitation

## 2024-08-02 ENCOUNTER — Other Ambulatory Visit: Payer: Self-pay

## 2024-08-02 DIAGNOSIS — Z833 Family history of diabetes mellitus: Secondary | ICD-10-CM

## 2024-08-02 DIAGNOSIS — I639 Cerebral infarction, unspecified: Secondary | ICD-10-CM

## 2024-08-02 DIAGNOSIS — Z794 Long term (current) use of insulin: Secondary | ICD-10-CM | POA: Diagnosis not present

## 2024-08-02 DIAGNOSIS — Z9181 History of falling: Secondary | ICD-10-CM | POA: Diagnosis not present

## 2024-08-02 DIAGNOSIS — W19XXXD Unspecified fall, subsequent encounter: Secondary | ICD-10-CM | POA: Diagnosis present

## 2024-08-02 DIAGNOSIS — E113293 Type 2 diabetes mellitus with mild nonproliferative diabetic retinopathy without macular edema, bilateral: Secondary | ICD-10-CM | POA: Diagnosis present

## 2024-08-02 DIAGNOSIS — G629 Polyneuropathy, unspecified: Secondary | ICD-10-CM | POA: Diagnosis present

## 2024-08-02 DIAGNOSIS — K521 Toxic gastroenteritis and colitis: Secondary | ICD-10-CM | POA: Diagnosis present

## 2024-08-02 DIAGNOSIS — M7989 Other specified soft tissue disorders: Secondary | ICD-10-CM

## 2024-08-02 DIAGNOSIS — I63312 Cerebral infarction due to thrombosis of left middle cerebral artery: Secondary | ICD-10-CM | POA: Diagnosis not present

## 2024-08-02 DIAGNOSIS — T3695XA Adverse effect of unspecified systemic antibiotic, initial encounter: Secondary | ICD-10-CM | POA: Diagnosis present

## 2024-08-02 DIAGNOSIS — I10 Essential (primary) hypertension: Secondary | ICD-10-CM

## 2024-08-02 DIAGNOSIS — I69351 Hemiplegia and hemiparesis following cerebral infarction affecting right dominant side: Principal | ICD-10-CM

## 2024-08-02 DIAGNOSIS — I6931 Attention and concentration deficit following cerebral infarction: Secondary | ICD-10-CM | POA: Diagnosis not present

## 2024-08-02 DIAGNOSIS — I69311 Memory deficit following cerebral infarction: Secondary | ICD-10-CM

## 2024-08-02 DIAGNOSIS — Z888 Allergy status to other drugs, medicaments and biological substances status: Secondary | ICD-10-CM

## 2024-08-02 DIAGNOSIS — R197 Diarrhea, unspecified: Secondary | ICD-10-CM

## 2024-08-02 DIAGNOSIS — Z79899 Other long term (current) drug therapy: Secondary | ICD-10-CM | POA: Diagnosis not present

## 2024-08-02 DIAGNOSIS — Z800 Family history of malignant neoplasm of digestive organs: Secondary | ICD-10-CM | POA: Diagnosis not present

## 2024-08-02 DIAGNOSIS — K219 Gastro-esophageal reflux disease without esophagitis: Secondary | ICD-10-CM | POA: Diagnosis present

## 2024-08-02 DIAGNOSIS — I63512 Cerebral infarction due to unspecified occlusion or stenosis of left middle cerebral artery: Principal | ICD-10-CM | POA: Diagnosis present

## 2024-08-02 DIAGNOSIS — E119 Type 2 diabetes mellitus without complications: Secondary | ICD-10-CM

## 2024-08-02 DIAGNOSIS — Z742 Need for assistance at home and no other household member able to render care: Secondary | ICD-10-CM | POA: Diagnosis present

## 2024-08-02 DIAGNOSIS — E785 Hyperlipidemia, unspecified: Secondary | ICD-10-CM | POA: Diagnosis present

## 2024-08-02 DIAGNOSIS — I96 Gangrene, not elsewhere classified: Secondary | ICD-10-CM | POA: Diagnosis present

## 2024-08-02 DIAGNOSIS — S0511XD Contusion of eyeball and orbital tissues, right eye, subsequent encounter: Secondary | ICD-10-CM

## 2024-08-02 DIAGNOSIS — M792 Neuralgia and neuritis, unspecified: Secondary | ICD-10-CM | POA: Diagnosis not present

## 2024-08-02 DIAGNOSIS — Z803 Family history of malignant neoplasm of breast: Secondary | ICD-10-CM

## 2024-08-02 DIAGNOSIS — I69314 Frontal lobe and executive function deficit following cerebral infarction: Secondary | ICD-10-CM | POA: Diagnosis not present

## 2024-08-02 DIAGNOSIS — R569 Unspecified convulsions: Secondary | ICD-10-CM | POA: Diagnosis not present

## 2024-08-02 DIAGNOSIS — D649 Anemia, unspecified: Secondary | ICD-10-CM | POA: Diagnosis present

## 2024-08-02 DIAGNOSIS — G40909 Epilepsy, unspecified, not intractable, without status epilepticus: Secondary | ICD-10-CM | POA: Diagnosis present

## 2024-08-02 DIAGNOSIS — R1310 Dysphagia, unspecified: Secondary | ICD-10-CM

## 2024-08-02 DIAGNOSIS — J9601 Acute respiratory failure with hypoxia: Secondary | ICD-10-CM | POA: Insufficient documentation

## 2024-08-02 DIAGNOSIS — Z7984 Long term (current) use of oral hypoglycemic drugs: Secondary | ICD-10-CM

## 2024-08-02 DIAGNOSIS — E1169 Type 2 diabetes mellitus with other specified complication: Secondary | ICD-10-CM

## 2024-08-02 DIAGNOSIS — I69391 Dysphagia following cerebral infarction: Secondary | ICD-10-CM | POA: Diagnosis not present

## 2024-08-02 DIAGNOSIS — Z825 Family history of asthma and other chronic lower respiratory diseases: Secondary | ICD-10-CM | POA: Diagnosis not present

## 2024-08-02 LAB — GLUCOSE, CAPILLARY
Glucose-Capillary: 122 mg/dL — ABNORMAL HIGH (ref 70–99)
Glucose-Capillary: 153 mg/dL — ABNORMAL HIGH (ref 70–99)
Glucose-Capillary: 155 mg/dL — ABNORMAL HIGH (ref 70–99)
Glucose-Capillary: 162 mg/dL — ABNORMAL HIGH (ref 70–99)
Glucose-Capillary: 165 mg/dL — ABNORMAL HIGH (ref 70–99)
Glucose-Capillary: 263 mg/dL — ABNORMAL HIGH (ref 70–99)

## 2024-08-02 LAB — CK: Total CK: 321 U/L — ABNORMAL HIGH (ref 38–234)

## 2024-08-02 MED ORDER — INSULIN ASPART 100 UNIT/ML IJ SOLN: 0.0000 [IU] | INTRAMUSCULAR | Status: DC

## 2024-08-02 MED ORDER — POLYETHYLENE GLYCOL 3350 17 G PO PACK: 17.0000 g | PACK | Freq: Every day | ORAL | Status: DC | PRN

## 2024-08-02 MED ORDER — VITAMIN C 500 MG PO TABS
1000.0000 mg | ORAL_TABLET | Freq: Every day | ORAL | Status: DC
  Administered 2024-08-03 – 2024-08-10 (×8): 1000 mg via ORAL
  Filled 2024-08-02 (×5): qty 2

## 2024-08-02 MED ORDER — AMLODIPINE BESYLATE 10 MG PO TABS: 10.0000 mg | ORAL_TABLET | Freq: Every day | ORAL | Status: DC

## 2024-08-02 MED ORDER — PROCHLORPERAZINE 25 MG RE SUPP: 12.5000 mg | Freq: Four times a day (QID) | RECTAL | Status: DC | PRN

## 2024-08-02 MED ORDER — LEVETIRACETAM 500 MG PO TABS: 500.0000 mg | ORAL_TABLET | Freq: Two times a day (BID) | ORAL | Status: DC

## 2024-08-02 MED ORDER — ROSUVASTATIN CALCIUM 20 MG PO TABS
40.0000 mg | ORAL_TABLET | Freq: Every day | ORAL | Status: DC
  Administered 2024-08-03 – 2024-08-08 (×6): 40 mg
  Filled 2024-08-02 (×5): qty 2

## 2024-08-02 MED ORDER — AMLODIPINE BESYLATE 10 MG PO TABS
10.0000 mg | ORAL_TABLET | Freq: Every day | ORAL | Status: DC
  Administered 2024-08-03 – 2024-08-08 (×6): 10 mg
  Filled 2024-08-02 (×5): qty 1

## 2024-08-02 MED ORDER — ENSURE PLUS HIGH PROTEIN PO LIQD
237.0000 mL | Freq: Two times a day (BID) | ORAL | Status: DC
  Administered 2024-08-03 – 2024-08-10 (×11): 237 mL via ORAL

## 2024-08-02 MED ORDER — INSULIN ASPART 100 UNIT/ML IJ SOLN
0.0000 [IU] | INTRAMUSCULAR | Status: DC
  Administered 2024-08-02: 4 [IU] via SUBCUTANEOUS
  Administered 2024-08-03: 7 [IU] via SUBCUTANEOUS
  Administered 2024-08-03: 11 [IU] via SUBCUTANEOUS
  Administered 2024-08-03: 7 [IU] via SUBCUTANEOUS
  Administered 2024-08-03 (×3): 4 [IU] via SUBCUTANEOUS
  Administered 2024-08-04: 11 [IU] via SUBCUTANEOUS
  Administered 2024-08-04: 4 [IU] via SUBCUTANEOUS
  Administered 2024-08-04: 7 [IU] via SUBCUTANEOUS
  Administered 2024-08-04: 4 [IU] via SUBCUTANEOUS
  Administered 2024-08-04: 7 [IU] via SUBCUTANEOUS
  Administered 2024-08-04: 4 [IU] via SUBCUTANEOUS
  Administered 2024-08-05: 7 [IU] via SUBCUTANEOUS
  Administered 2024-08-05: 4 [IU] via SUBCUTANEOUS
  Administered 2024-08-05: 7 [IU] via SUBCUTANEOUS
  Administered 2024-08-05: 3 [IU] via SUBCUTANEOUS
  Filled 2024-08-02: qty 7
  Filled 2024-08-02: qty 3
  Filled 2024-08-02: qty 11
  Filled 2024-08-02: qty 7
  Filled 2024-08-02 (×4): qty 4
  Filled 2024-08-02 (×2): qty 7
  Filled 2024-08-02: qty 4
  Filled 2024-08-02: qty 7
  Filled 2024-08-02 (×3): qty 4
  Filled 2024-08-02: qty 11

## 2024-08-02 MED ORDER — ASPIRIN 81 MG PO CHEW
81.0000 mg | CHEWABLE_TABLET | Freq: Every day | ORAL | Status: DC
  Administered 2024-08-03 – 2024-08-08 (×6): 81 mg
  Filled 2024-08-02 (×5): qty 1

## 2024-08-02 MED ORDER — ROSUVASTATIN CALCIUM 40 MG PO TABS: 40.0000 mg | ORAL_TABLET | Freq: Every day | ORAL | Status: DC

## 2024-08-02 MED ORDER — ACETAMINOPHEN 325 MG PO TABS
650.0000 mg | ORAL_TABLET | Freq: Four times a day (QID) | ORAL | Status: DC | PRN
  Administered 2024-08-03 – 2024-08-08 (×7): 650 mg
  Filled 2024-08-02 (×6): qty 2

## 2024-08-02 MED ORDER — GLUCERNA 1.5 CAL PO LIQD: 1000.0000 mL | ORAL | Status: DC

## 2024-08-02 MED ORDER — GLUCERNA 1.5 CAL PO LIQD
1000.0000 mL | ORAL | Status: DC
  Administered 2024-08-02: 1000 mL
  Filled 2024-08-02: qty 1000

## 2024-08-02 MED ORDER — INSULIN GLARGINE 100 UNIT/ML ~~LOC~~ SOLN: 12.0000 [IU] | Freq: Every day | SUBCUTANEOUS | Status: DC

## 2024-08-02 MED ORDER — ZINC SULFATE 220 (50 ZN) MG PO CAPS
220.0000 mg | ORAL_CAPSULE | Freq: Every day | ORAL | Status: DC
  Administered 2024-08-03 – 2024-08-09 (×7): 220 mg via ORAL
  Filled 2024-08-02 (×5): qty 1

## 2024-08-02 MED ORDER — ORAL CARE MOUTH RINSE: 15.0000 mL | OROMUCOSAL | Status: DC | PRN

## 2024-08-02 MED ORDER — PROCHLORPERAZINE MALEATE 5 MG PO TABS
5.0000 mg | ORAL_TABLET | Freq: Four times a day (QID) | ORAL | Status: DC | PRN
  Administered 2024-08-04 – 2024-08-10 (×8): 10 mg via ORAL
  Filled 2024-08-02 (×5): qty 2

## 2024-08-02 MED ORDER — ALUM & MAG HYDROXIDE-SIMETH 200-200-20 MG/5ML PO SUSP: 30.0000 mL | ORAL | Status: DC | PRN

## 2024-08-02 MED ORDER — ENOXAPARIN SODIUM 40 MG/0.4ML IJ SOSY
40.0000 mg | PREFILLED_SYRINGE | INTRAMUSCULAR | Status: DC
  Administered 2024-08-03 – 2024-08-07 (×5): 40 mg via SUBCUTANEOUS
  Filled 2024-08-02 (×5): qty 0.4

## 2024-08-02 MED ORDER — DOCUSATE SODIUM 50 MG/5ML PO LIQD: 100.0000 mg | Freq: Two times a day (BID) | ORAL | Status: DC | PRN

## 2024-08-02 MED ORDER — INSULIN GLARGINE 100 UNIT/ML ~~LOC~~ SOLN
12.0000 [IU] | Freq: Every day | SUBCUTANEOUS | Status: DC
  Administered 2024-08-03 – 2024-08-08 (×6): 12 [IU] via SUBCUTANEOUS
  Filled 2024-08-02 (×6): qty 0.12

## 2024-08-02 MED ORDER — LEVETIRACETAM 250 MG PO TABS
500.0000 mg | ORAL_TABLET | Freq: Two times a day (BID) | ORAL | Status: DC
  Administered 2024-08-02 – 2024-08-08 (×13): 500 mg
  Filled 2024-08-02 (×11): qty 2

## 2024-08-02 MED ORDER — ENSURE PLUS HIGH PROTEIN PO LIQD: 237.0000 mL | Freq: Two times a day (BID) | ORAL | Status: DC

## 2024-08-02 MED ORDER — BANATROL TF EN LIQD
60.0000 mL | Freq: Two times a day (BID) | ENTERAL | Status: DC
  Administered 2024-08-02 – 2024-08-08 (×7): 60 mL
  Filled 2024-08-02 (×7): qty 60

## 2024-08-02 MED ORDER — BISACODYL 10 MG RE SUPP: 10.0000 mg | Freq: Every day | RECTAL | Status: DC | PRN

## 2024-08-02 MED ORDER — GUAIFENESIN-DM 100-10 MG/5ML PO SYRP: 5.0000 mL | ORAL_SOLUTION | Freq: Four times a day (QID) | ORAL | Status: DC | PRN

## 2024-08-02 MED ORDER — FLEET ENEMA RE ENEM: 1.0000 | ENEMA | Freq: Once | RECTAL | Status: DC | PRN

## 2024-08-02 MED ORDER — BANATROL TF EN LIQD: 60.0000 mL | Freq: Two times a day (BID) | ENTERAL | Status: DC

## 2024-08-02 MED ORDER — DIPHENHYDRAMINE HCL 25 MG PO CAPS: 25.0000 mg | ORAL_CAPSULE | Freq: Four times a day (QID) | ORAL | Status: DC | PRN

## 2024-08-02 MED ORDER — ASPIRIN 81 MG PO CHEW: 81.0000 mg | CHEWABLE_TABLET | Freq: Every day | ORAL | Status: DC

## 2024-08-02 MED ORDER — PROCHLORPERAZINE EDISYLATE 10 MG/2ML IJ SOLN
5.0000 mg | Freq: Four times a day (QID) | INTRAMUSCULAR | Status: DC | PRN
  Administered 2024-08-03: 10 mg via INTRAVENOUS
  Filled 2024-08-02: qty 2

## 2024-08-02 MED ORDER — CHLORHEXIDINE GLUCONATE CLOTH 2 % EX PADS: 6.0000 | MEDICATED_PAD | CUTANEOUS | Status: DC

## 2024-08-02 NOTE — Progress Notes (Addendum)
 Inpatient Rehab Admissions Coordinator:    I have insurance approval and a bed available for pt to admit to CIR today. Dr. Drusilla in agreement and [facility] aware.  I will notify pt/family and make arrangements.    14:49: Reviewed admissions with daughter [name] on the phone.  Reche Lowers, PT, DPT Admissions Coordinator [phone] 08/02/2024 2:26 PM

## 2024-08-02 NOTE — Discharge Summary (Signed)
 " Physician Discharge Summary   Patient: Carolyn Hunter MRN: [id number] DOB: 1951-01-26  Admit date:     07/25/2024  Discharge date: 08/02/2024  Discharge Physician: Sabas GORMAN Brod   PCP: Orpha Yancey LABOR, MD   Recommendations at discharge:   Continue Keppra  500 mg p.o. twice daily Continue aspirin  81 mg daily Patient will need Zio patch at discharge Can stop tube feeding in next 24 hours if patient intake has improved  Discharge Diagnoses: Principal Problem:   Coma (HCC) Active Problems:   Encephalopathy   Ischemic stroke (HCC)  Resolved Problems:   * No resolved hospital problems. *  Hospital Course: 73 yo F w/ pertinent PMH HTN, HLD, T2DM brought in to [facility] on 12/15 w/ acute encephalopathy.   On 12/15 patient found down on ground. Apparently fell yesterday. Last known well unclear.   Not protecting airway consistently in ER so intubated for airway protection.  Was potentially moving left side less so stroke Cts performed which were neg.    Assessment and Plan:  Acute left frontotemporal ischemic stroke Seizures  - Stroke team following - Rosuvastatin  40 mg daily - Aspirin  81mg  - Echo with no acute abnormality - EEG 12/17 with epileptogenicity arising from the left central temporoparietal region. EEG 12/18 w/ moderate diffuse encephalopathy.  - Keppra  500 mg BID     Right hand swelling/ecchymosis -Improved -X-ray of right forearm from 07/25/2024 was unremarkable for acute fracture -Continue cold compresses     Acute hypoxic respiratory failure in the setting of altered mental status and inability to protect airway Right basilar opacity likely atelectasis versus aspiration pneumonia - Extubated 12/19 - Wean supplemental O2 for goal >88% - Completed ceftriaxone  x 5 days for suspected aspiration pneumonia.   Nutrition Diet: Continue tube feeds, tube feeds can be stopped in next 1 to 4 hours if p.o. intake has improved -Started on regular diet today GI PPX:  Continue Protonix  daily Bowel: PRN Miralax  and colace, LBM 12/18     Right basilar opacity concerning for aspiration pneumonia Asymptomatic bacteriuria - Ceftriaxone  for 5 days - Blood cultures 12/15 w/ aerococcus viridans in 1 bottle, suspect contaminant, 12/16 Bcx NGTD   Diabetes mellitus type 2 A1c 6.4. CBG this AM 170.  -Lantus  12 units daily  - Continue sliding scale insulin  with NovoLog      Normocytic anemia Hgb 9.6 (9.7). Noted blood during ETT exchange and bronch w/o acute bleed. No other signs of bleed. - Trend CBC    DVT ppx : Lovenox  daily    MSK/other  Multiple bruises with right eye ecchymosis CT head w/ R lateral periorbital hematoma CT C-spine with no acute fracture     Diarrhea - Likely in setting of laxatives and antibiotic use - Afebrile, no abdominal pain - Will monitor, no concern for C. difficile at this time            Consultants: Neurology Procedures performed:   Disposition: Rehabilitation facility Diet recommendation:  Regular diet DISCHARGE MEDICATION: Allergies as of 08/02/2024       Reactions   Ace Inhibitors Cough   Zetia  [ezetimibe ] Swelling, Other (See Comments)   Sore throat        Medication List     STOP taking these medications    losartan  25 MG tablet Commonly known as: COZAAR    metFORMIN  1000 MG tablet Commonly known as: GLUCOPHAGE        TAKE these medications    amLODipine  10 MG tablet Commonly known as: NORVASC  Place  1 tablet (10 mg total) into feeding tube daily. Start taking on: August 03, 2024 What changed: how to take this   aspirin  81 MG chewable tablet Place 1 tablet (81 mg total) into feeding tube daily. Start taking on: August 03, 2024   docusate 50 MG/5ML liquid Commonly known as: COLACE Place 10 mLs (100 mg total) into feeding tube 2 (two) times daily as needed for mild constipation.   feeding supplement (GLUCERNA 1.5 CAL) Liqd Place 1,000 mLs into feeding tube continuous.    feeding supplement Liqd Take 237 mLs by mouth 2 (two) times daily with a meal.   fiber supplement (BANATROL TF) liquid Place 60 mLs into feeding tube 2 (two) times daily.   insulin  aspart 100 UNIT/ML injection Commonly known as: novoLOG  Inject 0-20 Units into the skin every 4 (four) hours.   insulin  glargine 100 UNIT/ML injection Commonly known as: LANTUS  Inject 0.12 mLs (12 Units total) into the skin daily. Start taking on: August 03, 2024   levETIRAcetam  500 MG tablet Commonly known as: KEPPRA  Place 1 tablet (500 mg total) into feeding tube 2 (two) times daily.   polyethylene glycol 17 g packet Commonly known as: MIRALAX  / GLYCOLAX  Place 17 g into feeding tube daily as needed for moderate constipation.   rosuvastatin  40 MG tablet Commonly known as: CRESTOR  Place 1 tablet (40 mg total) into feeding tube daily. Start taking on: August 03, 2024 What changed:  how to take this Another medication with the same name was removed. Continue taking this medication, and follow the directions you see here.        Discharge Exam: Filed Weights   07/29/24 0500 07/30/24 0344 07/31/24 0439  Weight: 72.2 kg 72.8 kg 67.8 kg   General-appears in no acute distress Heart-S1-S2, regular, no murmur auscultated Lungs-clear to auscultation bilaterally, no wheezing or crackles auscultated Abdomen-soft, nontender, no organomegaly Extremities-no edema in the lower extremities Neuro-alert, oriented x3, no focal deficit noted  Condition at discharge: good  The results of significant diagnostics from this hospitalization (including imaging, microbiology, ancillary and laboratory) are listed below for reference.   Imaging Studies: DG Abd Portable 1V Result Date: 07/29/2024 EXAM: XR ABDOMEN INTRAOPERATIVE COUNT 1 VIEW(S) XRAY OF THE ABDOMEN 07/29/2024 02:55:52 PM COMPARISON: 07/28/2024 CLINICAL HISTORY: Encounter for feeding tube placement [id number] FINDINGS: No unexpected retained surgical  items. Enteric tube in place with distal tip terminating within the expected location of the gastric antrum. Small right pleural effusion and bibasilar atelectasis. IMPRESSION: 1. Enteric tube terminates within the gastric antrum. 2. Small right pleural effusion and bibasilar atelectasis. Electronically signed by: Greig Pique MD 07/29/2024 04:02 PM EST RP Workstation: [id number]   DG Abd 1 View Result Date: 07/29/2024 EXAM: 1 VIEW XRAY OF THE ABDOMEN 07/28/2024 11:15:00 PM COMPARISON: 07/25/2024 CLINICAL HISTORY: Encounter for nasogastric (NG) tube placement FINDINGS: LINES, TUBES AND DEVICES: Enteric tube in place with tip and side hole overlying the left upper quadrant, gastric body. BOWEL: Nonobstructive bowel gas pattern. SOFT TISSUES: No abnormal calcifications. BONES: No acute fracture. IMPRESSION: 1. Enteric tube in place with tip and side hole overlying the gastric body. Electronically signed by: Oneil Devonshire MD 07/29/2024 01:04 AM EST RP Workstation: [id number]   DG CHEST PORT 1 VIEW Result Date: 07/28/2024 EXAM: 1 VIEW(S) XRAY OF THE CHEST 07/28/2024 11:13:00 PM COMPARISON: chest x-ray 07/25/2024 5 CLINICAL HISTORY: Encounter for imaging study to confirm orogastric (OG) tube placement. FINDINGS: LINES, TUBES AND DEVICES: The endotracheal tube tip is at the level of the carina.  The enteric tube extends below the diaphragm, but the distal tip is not included on the image. LUNGS AND PLEURA: There is platelike atelectasis in the right lower lobe. There is likely a small right pleural effusion which is new. No pneumothorax. HEART AND MEDIASTINUM: The cardiomediastinal silhouette is stable and within normal limits. BONES AND SOFT TISSUES: No acute osseous abnormality. IMPRESSION: 1. Endotracheal tube tip at the level of the carina; consider retracting. 2. Enteric tube extends below the diaphragm, but the distal tip is not included on the image. 3. Likely small right pleural effusion, new. 4. Platelike  atelectasis in the right lower lobe. Electronically signed by: Greig Pique MD 07/28/2024 11:18 PM EST RP Workstation: [id number]   Overnight EEG with video Result Date: 07/27/2024 Shelton Arlin KIDD, MD     07/28/2024  5:10 AM Patient Name: CAYLEIGH PAULL MRN: [id number] Epilepsy Attending: Arlin KIDD Shelton Referring Physician/Provider: Zaida Zola SAILOR, MD Duration: 07/26/2024 1536 to 07/27/2024 1536 Patient history: 73 y.o. female found down with right-sided weakness. EEG to evaluate for seizure Level of alertness: Awake, asleep AEDs during EEG study: None Technical aspects: This EEG study was done with scalp electrodes positioned according to the 10-20 International system of electrode placement. Electrical activity was reviewed with band pass filter of 1-70Hz , sensitivity of 7 uV/mm, display speed of 106mm/sec with a 60Hz  notched filter applied as appropriate. EEG data were recorded continuously and digitally stored.  Video monitoring was available and reviewed as appropriate. Description: The posterior dominant rhythm consists of 8 Hz activity of moderate voltage (25-35 uV) seen predominantly in posterior head regions, symmetric and reactive to eye opening and eye closing. Sleep was characterized by vertex waves, sleep spindles (12 to 14 Hz), maximal frontocentral region. There is continuous generalized and lateralized left hemisphere 3 to 6 Hz theta-delta slowing. Sharp waves were noted in left centro-temporo-parietal region, predominantly during sleep. Hyperventilation and photic stimulation were not performed.   ABNORMALITY - Sharp wave, left centro-temporo-parietal region - Continuous slow, generalized and lateralized left hemisphere IMPRESSION: This study showed evidence of epileptogenicity arising from left centro-temporo-parietal region. Additionally there is cortical dysfunction arising from left hemisphere likely secondary to underlying structural abnormality. Lastly there is moderate diffuse  encephalopathy. No seizures were seen throughout the recording. Arlin KIDD Shelton   ECHOCARDIOGRAM COMPLETE Result Date: 07/26/2024    ECHOCARDIOGRAM REPORT   Patient Name:   DESHAWNDA ACREY Date of Exam: 07/26/2024 Medical Rec #:  [id number]      Height:       63.0 in Accession #:    [id number]     Weight:       158.3 lb Date of Birth:  10-06-1950      BSA:          1.751 m Patient Age:    73 years       BP:           100/50 mmHg Patient Gender: F              HR:           88 bpm. Exam Location:  Inpatient Procedure: 2D Echo, Cardiac Doppler, Color Doppler and Intracardiac            Opacification Agent (Both Spectral and Color Flow Doppler were            utilized during procedure). Indications:    Stroke  History:        Patient has no prior history of Echocardiogram  examinations.                 Arrythmias:Tachycardia; Risk Factors:Dyslipidemia, Hypertension                 and Diabetes.  Sonographer:    Sherlean Dubin Referring Phys: [phone] MCNEILL P KIRKPATRICK  Sonographer Comments: Echo performed with patient supine and on artificial respirator. Image acquisition challenging due to respiratory motion. IMPRESSIONS  1. Left ventricular ejection fraction, by estimation, is 60 to 65%. The left ventricle has normal function. The left ventricle has no regional wall motion abnormalities. Left ventricular diastolic parameters were normal.  2. Right ventricular systolic function is normal. The right ventricular size is normal.  3. The mitral valve is normal in structure. No evidence of mitral valve regurgitation. No evidence of mitral stenosis.  4. The aortic valve is normal in structure. Aortic valve regurgitation is not visualized. No aortic stenosis is present.  5. The inferior vena cava is normal in size with greater than 50% respiratory variability, suggesting right atrial pressure of 3 mmHg. Conclusion(s)/Recommendation(s): No intracardiac source of embolism detected on this transthoracic study. Consider a  transesophageal echocardiogram to exclude cardiac source of embolism if clinically indicated. Images were technically difficult. FINDINGS  Left Ventricle: There is no left ventricular thrombus. Definity  contrast was used. Left ventricular ejection fraction, by estimation, is 60 to 65%. The left ventricle has normal function. The left ventricle has no regional wall motion abnormalities. Definity  contrast agent was given IV to delineate the left ventricular endocardial borders. The left ventricular internal cavity size was normal in size. There is no left ventricular hypertrophy. Left ventricular diastolic parameters were normal. Right Ventricle: The right ventricular size is normal. No increase in right ventricular wall thickness. Right ventricular systolic function is normal. Left Atrium: Left atrial size was not well visualized. Right Atrium: Right atrial size was not well visualized. Pericardium: There is no evidence of pericardial effusion. Mitral Valve: The mitral valve is normal in structure. No evidence of mitral valve regurgitation. No evidence of mitral valve stenosis. Tricuspid Valve: The tricuspid valve is normal in structure. Tricuspid valve regurgitation is not demonstrated. No evidence of tricuspid stenosis. Aortic Valve: The aortic valve is normal in structure. Aortic valve regurgitation is not visualized. No aortic stenosis is present. Aortic valve mean gradient measures 3.0 mmHg. Aortic valve peak gradient measures 5.2 mmHg. Aortic valve area, by VTI measures 2.68 cm. Pulmonic Valve: The pulmonic valve was normal in structure. Pulmonic valve regurgitation is not visualized. No evidence of pulmonic stenosis. Aorta: The aortic root is normal in size and structure. Venous: The inferior vena cava is normal in size with greater than 50% respiratory variability, suggesting right atrial pressure of 3 mmHg. IAS/Shunts: The interatrial septum was not well visualized.  LEFT VENTRICLE PLAX 2D LVIDd:          3.90 cm   Diastology LVIDs:         2.80 cm   LV e' medial:    4.58 cm/s LV PW:         1.00 cm   LV E/e' medial:  17.3 LV IVS:        1.00 cm   LV e' lateral:   7.58 cm/s LVOT diam:     2.00 cm   LV E/e' lateral: 10.4 LV SV:         51 LV SV Index:   29 LVOT Area:     3.14 cm  RIGHT VENTRICLE  IVC RV S prime:     6.87 cm/s  IVC diam: 1.30 cm TAPSE (M-mode): 1.1 cm LEFT ATRIUM             Index        RIGHT ATRIUM           Index LA diam:        3.20 cm 1.83 cm/m   RA Area:     11.10 cm LA Vol (A2C):   53.8 ml 30.73 ml/m  RA Volume:   21.60 ml  12.34 ml/m LA Vol (A4C):   24.3 ml 13.88 ml/m LA Biplane Vol: 39.4 ml 22.51 ml/m  AORTIC VALVE AV Area (Vmax):    2.73 cm AV Area (Vmean):   2.62 cm AV Area (VTI):     2.68 cm AV Vmax:           114.50 cm/s AV Vmean:          75.450 cm/s AV VTI:            0.189 m AV Peak Grad:      5.2 mmHg AV Mean Grad:      3.0 mmHg LVOT Vmax:         99.50 cm/s LVOT Vmean:        62.850 cm/s LVOT VTI:          0.161 m LVOT/AV VTI ratio: 0.85  AORTA Ao Root diam: 2.60 cm MITRAL VALVE MV Area (PHT): 4.06 cm    SHUNTS MV Decel Time: 187 msec    Systemic VTI:  0.16 m MV E velocity: 79.10 cm/s  Systemic Diam: 2.00 cm MV A velocity: 76.50 cm/s MV E/A ratio:  1.03 Mihai Croitoru MD Electronically signed by Jerel Balding MD Signature Date/Time: 07/26/2024/3:56:36 PM    Final    EEG adult Result Date: 07/26/2024 Shelton Arlin KIDD, MD     07/26/2024 10:33 AM Patient Name: BRINLEIGH TEW MRN: [id number] Epilepsy Attending: Arlin KIDD Shelton Referring Physician/Provider: Michaela Aisha SQUIBB, MD Date: 07/26/2024 Duration: 22.39 mins Patient history: 73yo F with ams. EEG to evaluate for seizure Level of alertness: comatose AEDs during EEG study: LEV, Propofol  Technical aspects: This EEG study was done with scalp electrodes positioned according to the 10-20 International system of electrode placement. Electrical activity was reviewed with band pass filter of 1-70Hz ,  sensitivity of 7 uV/mm, display speed of 10mm/sec with a 60Hz  notched filter applied as appropriate. EEG data were recorded continuously and digitally stored.  Video monitoring was available and reviewed as appropriate. Description: EEG showed continuous generalized 3 to 6 Hz theta-delta slowing. Hyperventilation and photic stimulation were not performed.   ABNORMALITY - Continuous slow, generalized IMPRESSION: This study is suggestive of generalized cerebral dysfunction (encephalopathy). No seizures or epileptiform discharges were seen throughout the recording. Arlin KIDD Shelton   MR CERVICAL SPINE WO CONTRAST Result Date: 07/25/2024 EXAM: MRI CERVICAL SPINE WITHOUT CONTRAST 07/25/2024 10:04:14 PM TECHNIQUE: Multiplanar multisequence MRI of the cervical spine was performed. COMPARISON: Comparison made with prior CT from earlier the same day. CLINICAL HISTORY: Myelopathy, acute, cervical spine. FINDINGS: BONES AND ALIGNMENT: Straightening with slight reversal of the normal cervical lordosis. No significant listhesis. Vertebral body height maintained without acute or chronic fracture. Bone marrow signal intensity within normal limits. No discrete or worrisome osseous lesions. No abnormal marrow edema. SPINAL CORD: Normal signal morphology seen within the cervical spinal cord. SOFT TISSUES: No paraspinal mass. Endotracheal and enteric tubes in place. VASCULATURE: Loss of normal flow void  within the left vertebral artery, likely reflecting slow flow related to proximal stenosis as seen on prior CTA. C2-C3: Normal interspace. Mild bilateral facet spurring. No stenosis. C3-C4: Small central disc protrusion mildly indents ventral thecal sac. Mild left-sided facet hypertrophy. No spinal stenosis. Foramina remain patent. C4-C5: Mild disc bulge with uncovertebral spurring. Mild left-sided facet hypertrophy. No significant spinal stenosis. Foramina remain patent. C5-C6: Advanced degenerative changes and vertebral disc  space narrowing. Left paracentral disc osteophyte complex flattens and indents the left ventral thecal sac. Mild cord flattening without cord signal changes. Moderate spinal stenosis. Uncovertebral spurring without significant foraminal encroachment. C6-C7: Small right paracentral disc protrusion mildly indents ventral thecal sac. No significant spinal stenosis. Foramina remain patent. C7-T1: Small left paracentral disc protrusion mildly flattens the ventral thecal sac. Mild left-sided facet hypertrophy. No significant spinal stenosis. Foramina remain patent. IMPRESSION: 1. Normal MRI appearance of the cervical spinal cord. 2. Cervical spondylosis at C5-C6 with resultant moderate spinal stenosis. 3. Loss of normal flow void within the left vertebral artery, presumably reflecting slow flow related to proximal stenosis as seen on prior CTA. Electronically signed by: Morene Hoard MD 07/25/2024 10:48 PM EST RP Workstation: [id number]   MR BRAIN WO CONTRAST Result Date: 07/25/2024 EXAM: MRI BRAIN WITHOUT CONTRAST 07/25/2024 10:04:14 PM TECHNIQUE: Multiplanar multisequence MRI of the head/brain was performed without the administration of intravenous contrast. COMPARISON: CT from earlier the same day. CLINICAL HISTORY: Delirium. FINDINGS: BRAIN AND VENTRICLES: Patchy T2/FLAIR hyperintensity involving the periventricular and deep white matter, consistent with chronic small vessel ischemic disease, moderate in nature. Few small patchy foci of restricted diffusion seen involving the posterior left parietal region, consistent with small acute ischemic infarcts (series 9, images 82, 83, 79). No associated acute intracranial hemorrhage or mass effect. Multiple scattered chronic microhemorrhages noted, favored to be hypertensive in nature. No mass. No midline shift. No hydrocephalus. The sella is unremarkable. Normal flow voids. ORBITS: Prior bilateral ocular lens replacement. SINUSES AND MASTOIDS: Scattered mucosal  thickening is present about the paranasal sinuses. BONES AND SOFT TISSUES: Normal marrow signal. Diffuse soft tissue swelling present about the right frontal scalp/periorbital region, consistent with a contusion. The patient is intubated. IMPRESSION: 1. Small acute ischemic infarcts involving . the posterior left frontoparietal region. No associated hemorrhage or mass effect. 2. Underlying moderate chronic microvascular ischemic disease. 3. Right frontal scalp/periorbital soft tissue contusion. Electronically signed by: Morene Hoard MD 07/25/2024 10:36 PM EST RP Workstation: [id number]   DG Abd Portable 1 View Result Date: 07/25/2024 EXAM: 1 VIEW XRAY OF THE ABDOMEN 07/25/2024 04:45:00 PM COMPARISON: None available. CLINICAL HISTORY: intubation OG tube placement FINDINGS: LINES, TUBES AND DEVICES: Enteric tube in place with tip in the mid stomach. BOWEL: Nonobstructive bowel gas pattern. SOFT TISSUES: Excreted contrast in the renal collecting systems. BONES: No acute fracture. IMPRESSION: 1. Enteric tube with tip in the mid stomach. Electronically signed by: Pinkie Pebbles MD 07/25/2024 05:42 PM EST RP Workstation: [id number]   DG Chest Portable 1 View Result Date: 07/25/2024 EXAM: 1 VIEW(S) XRAY OF THE CHEST 07/25/2024 04:45:00 PM COMPARISON: 07/25/2024 CLINICAL HISTORY: intubation FINDINGS: LINES, TUBES AND DEVICES: Endotracheal tube in place with tip 2 cm above the carina. Enteric tube in place with tip in the mid stomach. LUNGS AND PLEURA: Low lung volumes with vascular crowding. Right basilar opacity, favoring atelectasis. No pleural effusion. No pneumothorax. HEART AND MEDIASTINUM: No acute abnormality of the cardiac and mediastinal silhouettes. BONES AND SOFT TISSUES: No acute osseous abnormality. IMPRESSION: 1. Endotracheal tube and enteric tube  in appropriate position. 2. Right basilar opacity, favoring atelectasis. Electronically signed by: Pinkie Pebbles MD 07/25/2024 05:41 PM EST RP  Workstation: [id number]   CT ANGIO HEAD NECK W WO CM W PERF (CODE STROKE) LKW > 6h Result Date: 07/25/2024 EXAM: CTA Head and Neck with Perfusion 07/25/2024 03:46:29 PM TECHNIQUE: CTA of the head and neck was performed with the administration of intravenous contrast. 3D postprocessing with multiplanar reconstructions and MIPs was performed to evaluate the vascular anatomy. Cerebral perfusion analysis using computed tomography with contrast administration, including post-processing of parametric maps with determination of cerebral blood flow, cerebral blood volume, mean transit time and time-to-maximum. Automated exposure control, iterative reconstruction, and/or weight based adjustment of the mA/kV was utilized to reduce the radiation dose to as low as reasonably achievable. CONTRAST: Without and with IV contrast. 100 mL (iohexol  (OMNIPAQUE ) 350 MG/ML injection 100 mL IOHEXOL  350 MG/ML SOLN). COMPARISON: Same day CT head. CLINICAL HISTORY: Neuro deficit, acute, stroke suspected. FINDINGS: CTA NECK: AORTIC ARCH AND ARCH VESSELS: Mild atherosclerosis along the proximal left subclavian artery without significant stenosis. No dissection or arterial injury. No significant stenosis of the brachiocephalic or subclavian arteries. CERVICAL CAROTID ARTERIES: The right carotid artery is patent from the origin to the skull base. There is mild atherosclerosis along the distal right common carotid artery and at the right carotid bifurcation without hemodynamically significant stenosis. The left carotid artery is patent from the origin to the skull base. Mild atherosclerosis of the distal left common carotid artery and left carotid bifurcation without hemodynamically significant stenosis. No dissection or arterial injury. CERVICAL VERTEBRAL ARTERIES: The right vertebral artery is dominant. The vertebral arteries are patent from the origins to the vertebrobasilar confluence. There is long segment severe stenosis of the left V1  segment without evidence of occlusion. Additional atherosclerosis of the bilateral V4 segments without significant stenosis. No dissection or arterial injury. LUNGS AND MEDIASTINUM: Unremarkable. SOFT TISSUES: Subcentimeter nodules in the right thyroid  lobe. BONES: No acute abnormality. CTA HEAD: ANTERIOR CIRCULATION: The intracranial internal carotid arteries are patent bilaterally. Mild atherosclerosis of the carotid siphons without significant stenosis. The anterior cerebral arteries are patent bilaterally. Right MCA is patent. There is mild stenosis of the distal right M1 segment. The left MCA is patent. There is mild stenosis of the liver and kidney division branches of the left MCA without evidence of focal stenosis or evidence of occlusion. Additional atherosclerotic irregularity of distal left MCA branches in the posterior aspect of the left MCA territory. No aneurysm. POSTERIOR CIRCULATION: There is moderate stenosis of the posterior P2 segment of the left PCA. No significant stenosis of the basilar artery. No significant stenosis of the vertebral arteries. No aneurysm. OTHER: No dural venous sinus thrombosis on this non-dedicated study. CT PERFUSION: EXAM QUALITY: The perfusion portion of the examination is significantly limited due to motion artifact. CORE INFARCT (CBF<30% volume): There is no region of cerebral blood flow less than 30 percent. 0 mL TOTAL HYPOPERFUSION (Tmax>6s volume): There are scattered areas of elevated Tmax greater than 6 seconds with a volume of 12 mL. PENUMBRA: Mismatch volume of 12 mL. The majority of the regions of elevated Tmax are favored to be artifactual given the degree of motion artifact. However, some of the more prolonged regions of elevated Tmax appear to localize within the anterior aspect of the left MCA territory, correlating with region of clinical concern. Recommend MRI brain for further evaluation. Mismatch ratio: not applicable Location: Anterior aspect of the  left MCA territory. IMPRESSION: 1.  No acute large vessel occlusion. 2. CT perfusion is significantly limited by motion artifact. Some regions of elevated Tmax appear to localize to the anterior left MCA territory, which could reflect true perfusion delay. Recommend MRI brain for further evaluation. 3. 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 4. Moderate stenosis of the posterior P2 segment of the left PCA. 5. Diminutive caliber of left M2 superior division branches without focal stenosis or occlusion. Atherosclerotic irregularity of distal left MCA branches in the posterior aspect of the left MCA territory. 6. Finding of no LVO and perfusion results were discussed with Dr. Michaela at 3:47 PM on 07/25/24. Electronically signed by: Donnice Mania MD 07/25/2024 04:17 PM EST RP Workstation: [id number]   CT HEAD CODE STROKE WO CONTRAST Result Date: 07/25/2024 EXAM: CT HEAD WITHOUT CONTRAST 07/25/2024 03:42:53 PM TECHNIQUE: CT of the head was performed without the administration of intravenous contrast. Automated exposure control, iterative reconstruction, and/or weight based adjustment of the mA/kV was utilized to reduce the radiation dose to as low as reasonably achievable. COMPARISON: 07/25/2024 CLINICAL HISTORY: Neuro deficit, acute, stroke suspected. FINDINGS: BRAIN AND VENTRICLES: Diffuse age-appropriate atrophy throughout brain parenchyma. Mild periventricular white matter changes and mild ex vacuo ventricular dilatation. No acute hemorrhage. No evidence of acute infarct. No hydrocephalus. No extra-axial collection. No mass effect or midline shift. ORBITS: Right periorbital hematoma similar to prior exam. SINUSES: No acute abnormality. SOFT TISSUES AND SKULL: Right periorbital hematoma similar to prior exam. No skull fracture. [place] Stroke Program Early CT (ASPECT) Score: Ganglionic (caudate, internal capsule, lentiform nucleus, insula, M1-M3): 7 Supraganglionic  (M4-M6): 3 Total: 10 IMPRESSION: 1. No acute intracranial abnormality. 2. ASPECTS 10. 3. Right periorbital hematoma, unchanged from prior exam. 4. Findings called to Dr. Michaela at 3:47 PM on 07/25/24. Electronically signed by: Donnice Mania MD 07/25/2024 04:00 PM EST RP Workstation: [id number]   DG Pelvis Portable Result Date: 07/25/2024 EXAM: 1 or 2 VIEW(S) XRAY OF THE PELVIS 07/25/2024 02:28:02 PM COMPARISON: CT chest, abdomen, and pelvis 07/25/2024. CLINICAL HISTORY: Unresponsive after unwitnessed fall. FINDINGS: BONES AND JOINTS: No acute fracture. Degenerative changes in the spine and hips. SI joints and symphysis pubis are not displaced. SOFT TISSUES: Residual contrast material in the bladder and renal collecting systems. IMPRESSION: 1. No acute fracture or pelvic ring disruption identified. Electronically signed by: Elsie Gravely MD 07/25/2024 02:46 PM EST RP Workstation: [id number]   DG Forearm Right Result Date: 07/25/2024 EXAM: 1 VIEW(S) XRAY OF THE RIGHT FOREARM 07/25/2024 02:28:02 PM COMPARISON: None available. CLINICAL HISTORY: trauma trauma FINDINGS: BONES AND JOINTS: No evidence of acute fracture or dislocation of the right forearm. Degenerative changes in the right wrist. SOFT TISSUES: Mild soft tissue swelling over the dorsum of the forearm. IMPRESSION: 1. Mild soft tissue swelling over the dorsum of the forearm. 2. No acute fracture or dislocation of the right forearm. Electronically signed by: Elsie Gravely MD 07/25/2024 02:44 PM EST RP Workstation: [id number]   DG Chest Port 1 View Result Date: 07/25/2024 EXAM: 1 VIEW(S) XRAY OF THE CHEST 07/25/2024 02:28:02 PM COMPARISON: CT chest abdomen and pelvis 07/25/2024. CLINICAL HISTORY: Trauma. FINDINGS: LUNGS AND PLEURA: Shallow inspiration. Infiltration or atelectasis in the right lung base. No pleural effusion. No pneumothorax. HEART AND MEDIASTINUM: Mediastinal contours appear intact. BONES AND SOFT TISSUES: Degenerative changes  in the spine and shoulders. IMPRESSION: 1. Infiltration or atelectasis in the right lung base. 2. No pleural effusion or pneumothorax. Electronically signed by: Elsie Gravely MD 07/25/2024 02:43 PM EST  RP Workstation: [id number]   CT Head Wo Contrast Result Date: 07/25/2024 EXAM: CT HEAD WITH INTRAVENOUS CONTRAST 07/25/2024 01:54:56 PM TECHNIQUE: CT of the head was performed with the administration of 75 mL of iohexol  (OMNIPAQUE ) 350 MG/ML injection. Automated exposure control, iterative reconstruction, and/or weight based adjustment of the mA/kV was utilized to reduce the radiation dose to as low as reasonably achievable. COMPARISON: None available. CLINICAL HISTORY: Mental status change, unknown cause; Polytrauma, blunt. FINDINGS: BRAIN AND VENTRICLES: No acute intracranial hemorrhage. No mass effect or midline shift. No extra-axial fluid collection. No evidence of acute infarct. No hydrocephalus. Cavum septum pellucidum and vergae. Hypoattenuating foci in the cerebral white matter, most likely representing chronic small vessel disease. ORBITS: A right lateral periorbital hematoma is identified which has a thickness of 1 cm, image 16/3. SINUSES AND MASTOIDS: Mild mucosal thickening involving the ethmoid air cells, right maxillary sinus and sphenoid sinus. SOFT TISSUES AND SKULL: No acute skull fracture. A right lateral periorbital hematoma is identified which has a thickness of 1 cm, image 16/3. IMPRESSION: 1. No acute intracranial abnormality. 2. Right lateral periorbital hematoma measuring 1 cm in thickness. 3. Mild paranasal sinus mucosal thickening. Electronically signed by: Waddell Calk MD 07/25/2024 02:14 PM EST RP Workstation: [id number]   CT CHEST ABDOMEN PELVIS W CONTRAST Result Date: 07/25/2024 EXAM: CT CHEST, ABDOMEN AND PELVIS WITH CONTRAST 07/25/2024 01:54:56 PM TECHNIQUE: CT of the chest, abdomen and pelvis was performed with the administration of 75 mL of iohexol  (OMNIPAQUE ) 350 MG/ML  injection. Multiplanar reformatted images are provided for review. Automated exposure control, iterative reconstruction, and/or weight based adjustment of the mA/kV was utilized to reduce the radiation dose to as low as reasonably achievable. COMPARISON: None available. CLINICAL HISTORY: Sepsis. FINDINGS: CHEST: Mild scattered calcified plaque in the descending thoracic aorta. MEDIASTINUM AND LYMPH NODES: 3 vessel coronary calcifications. Heart and pericardium are otherwise unremarkable. The central airways are clear. No mediastinal, hilar or axillary lymphadenopathy. LUNGS AND PLEURA: Subsegmental pleural based atelectasis, consolidation or scarring laterally in the right middle lobe. Dependent atelectasis in both lung bases. No pleural effusion or pneumothorax. ABDOMEN AND PELVIS: LIVER: The liver is unremarkable. GALLBLADDER AND BILE DUCTS: Multiple partially calcified gallstones measuring up to 1 cm in diameter in the gallbladder fundus. No biliary ductal dilatation. SPLEEN: No acute abnormality. PANCREAS: No acute abnormality. ADRENAL GLANDS: No acute abnormality. KIDNEYS, URETERS AND BLADDER: No stones in the kidneys or ureters. No hydronephrosis. No perinephric or periureteral stranding. Urinary bladder is unremarkable. GI AND BOWEL: Stomach demonstrates no acute abnormality. A few scattered diverticula near the descending sigmoid junction without adjacent inflammatory change. There is no bowel obstruction. REPRODUCTIVE ORGANS: No acute abnormality. PERITONEUM AND RETROPERITONEUM: No ascites. No free air. VASCULATURE: Moderate calcified plaque in the abdominal aorta without aneurysm. Aorta is normal in caliber. ABDOMINAL AND PELVIS LYMPH NODES: No lymphadenopathy. REPRODUCTIVE ORGANS: No acute abnormality. BONES AND SOFT TISSUES: Vertebral endplate spurring at multiple levels in the lower thoracic spine. Right shoulder DJD. No focal soft tissue abnormality. IMPRESSION: 1. No acute abnormality of the chest,  abdomen, and pelvis related to sepsis. Electronically signed by: Katheleen Faes MD 07/25/2024 02:14 PM EST RP Workstation: [id number]   CT Cervical Spine Wo Contrast Result Date: 07/25/2024 EXAM: CT CERVICAL SPINE WITHOUT CONTRAST 07/25/2024 01:54:56 PM TECHNIQUE: CT of the cervical spine was performed without the administration of intravenous contrast. Multiplanar reformatted images are provided for review. Automated exposure control, iterative reconstruction, and/or weight based adjustment of the mA/kV was utilized to reduce the  radiation dose to as low as reasonably achievable. COMPARISON: None available. CLINICAL HISTORY: Neck trauma (Age >= 65y). FINDINGS: BONES AND ALIGNMENT: No acute fracture or traumatic malalignment. Acquired fusion at C5-C6. 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 SOFT TISSUES: No prevertebral soft tissue swelling. Atherosclerotic calcifications of the carotid bulbs. IMPRESSION: 1. No acute fracture or traumatic malalignment of the cervical spine. 2. 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 Electronically signed by: Ryan Chess MD 07/25/2024 02:13 PM EST RP Workstation: [id number]   Intravitreal Injection, Pharmacologic Agent - OD - Right Eye Result Date: 07/13/2024 Time Out 07/13/2024. 1:50 PM. Confirmed correct patient, procedure, site, and patient consented. Anesthesia Topical anesthesia was used. Anesthetic medications included Lidocaine  2%, Proparacaine 0.5%. Procedure Preparation included 5% betadine  to ocular surface, eyelid speculum. A supplied needle was used. Injection: 6 mg faricimab -svoa 6 MG/0.05ML (Patient supplied)   Route: Intravitreal, Site: Right Eye   NDC: 49757-903-98, Lot: [id number], Expiration date: 05/10/2026, Waste: 0 mL Post-op Post injection exam found  visual acuity of at least counting fingers. The patient tolerated the procedure well. There were no complications. The patient received written and verbal post procedure care education. Notes **SAMPLE MEDICATION ADMINISTERED**  OCT, Retina - OU - Both Eyes Result Date: 07/13/2024 Right Eye Quality was good. Central Foveal Thickness: 370. Progression has improved. Findings include normal foveal contour, no SRF, intraretinal hyper-reflective material, intraretinal fluid (Persistent IRF inferotemporal fovea and mac-- slightly improved). Left Eye Quality was good. Central Foveal Thickness: 344. Progression has been stable. Findings include no SRF, abnormal foveal contour, intraretinal hyper-reflective material, epiretinal membrane, intraretinal fluid, macular pucker, vitreomacular adhesion (ERM with blunting of foveal contour; stable improvement in [facility] and cystic changes temporal mac). Notes **Images taken, stored on drive** Diagnosis / Impression: DME OU (OD>OS) OD: Persistent IRF inferotemporal fovea and mac-- slightly improved OS: ERM with blunting of foveal contour; stable improvement in [facility] and cystic changes temporal mac Clinical management: See below Abbreviations: NFP - Normal foveal profile. CME - cystoid macular edema. PED - pigment epithelial detachment. IRF - intraretinal fluid. SRF - subretinal fluid. EZ - ellipsoid zone. ERM - epiretinal membrane. ORA - outer retinal atrophy. ORT - outer retinal tubulation. SRHM - subretinal hyper-reflective material    Microbiology: Results for orders placed or performed during the hospital encounter of 07/25/24  Culture, blood (single)     Status: Abnormal   Collection Time: 07/25/24  2:12 PM   Specimen: BLOOD  Result Value Ref Range Status   Specimen Description BLOOD SITE NOT SPECIFIED  Final   Special Requests   Final    BOTTLES DRAWN AEROBIC ONLY Blood Culture results may not be optimal due to an inadequate volume of blood received in culture bottles    Culture  Setup Time   Final    GRAM POSITIVE COCCI AEROBIC BOTTLE ONLY CRITICAL RESULT CALLED TO, READ BACK BY AND VERIFIED WITH: PHARMD ELIZABETH Valent ON 07/26/24 @ 1239 BY DRT    Culture (A)  Final    AEROCOCCUS VIRIDANS Standardized susceptibility testing for this organism is not available. Performed at [facility], [address], [place], [place] [address]    Report Status 07/27/2024 FINAL  Final  Blood Culture ID Panel (Reflexed)     Status: Abnormal   Collection Time: 07/25/24  2:12 PM  Result Value Ref Range Status  Enterococcus faecalis NOT DETECTED NOT DETECTED Final   Enterococcus Faecium NOT DETECTED NOT DETECTED Final   Listeria monocytogenes NOT DETECTED NOT DETECTED Final   Staphylococcus species NOT DETECTED NOT DETECTED Final   Staphylococcus aureus (BCID) NOT DETECTED NOT DETECTED Final   Staphylococcus epidermidis NOT DETECTED NOT DETECTED Final   Staphylococcus lugdunensis NOT DETECTED NOT DETECTED Final   Streptococcus species DETECTED (A) NOT DETECTED Final    Comment: Not Enterococcus species, Streptococcus agalactiae, Streptococcus pyogenes, or Streptococcus pneumoniae. CRITICAL RESULT CALLED TO, READ BACK BY AND VERIFIED WITH: PHARMD ELIZABETH Zayas ON 07/26/24 @ 1239 BY DRT    Streptococcus agalactiae NOT DETECTED NOT DETECTED Final   Streptococcus pneumoniae NOT DETECTED NOT DETECTED Final   Streptococcus pyogenes NOT DETECTED NOT DETECTED Final   A.calcoaceticus-baumannii NOT DETECTED NOT DETECTED Final   Bacteroides fragilis NOT DETECTED NOT DETECTED Final   Enterobacterales NOT DETECTED NOT DETECTED Final   Enterobacter cloacae complex NOT DETECTED NOT DETECTED Final   Escherichia coli NOT DETECTED NOT DETECTED Final   Klebsiella aerogenes NOT DETECTED NOT DETECTED Final   Klebsiella oxytoca NOT DETECTED NOT DETECTED Final   Klebsiella pneumoniae NOT DETECTED NOT DETECTED Final   Proteus species NOT DETECTED NOT DETECTED Final    Salmonella species NOT DETECTED NOT DETECTED Final   Serratia marcescens NOT DETECTED NOT DETECTED Final   Haemophilus influenzae NOT DETECTED NOT DETECTED Final   Neisseria meningitidis NOT DETECTED NOT DETECTED Final   Pseudomonas aeruginosa NOT DETECTED NOT DETECTED Final   Stenotrophomonas maltophilia NOT DETECTED NOT DETECTED Final   Candida albicans NOT DETECTED NOT DETECTED Final   Candida auris NOT DETECTED NOT DETECTED Final   Candida glabrata NOT DETECTED NOT DETECTED Final   Candida krusei NOT DETECTED NOT DETECTED Final   Candida parapsilosis NOT DETECTED NOT DETECTED Final   Candida tropicalis NOT DETECTED NOT DETECTED Final   Cryptococcus neoformans/gattii NOT DETECTED NOT DETECTED Final    Comment: Performed at [facility], [address], [place], [place] [address]  MRSA Next Gen by PCR, Nasal     Status: None   Collection Time: 07/25/24  5:57 PM   Specimen: Nasal Mucosa; Nasal Swab  Result Value Ref Range Status   MRSA by PCR Next Gen NOT DETECTED NOT DETECTED Final    Comment: (NOTE) The GeneXpert MRSA Assay (FDA approved for NASAL specimens only), is one component of a comprehensive MRSA colonization surveillance program. It is not intended to diagnose MRSA infection nor to guide or monitor treatment for MRSA infections. Test performance is not FDA approved in patients less than 84 years old. Performed at [facility], [address], [place], [place] [address]   Culture, blood (Routine X 2) w Reflex to ID Panel     Status: None   Collection Time: 07/26/24  2:47 PM   Specimen: BLOOD LEFT HAND  Result Value Ref Range Status   Specimen Description BLOOD LEFT HAND  Final   Special Requests IN PEDIATRIC BOTTLE Blood Culture adequate volume  Final   Culture   Final    NO GROWTH 5 DAYS Performed at [facility], [address], [place], [place] [address]    Report Status 07/31/2024 FINAL  Final  Culture, blood (Routine X 2) w Reflex to ID Panel      Status: None   Collection Time: 07/26/24  2:48 PM   Specimen: BLOOD RIGHT HAND  Result Value Ref Range Status   Specimen Description BLOOD RIGHT  HAND  Final   Special Requests   Final    BOTTLES DRAWN AEROBIC ONLY Blood Culture results may not be optimal due to an inadequate volume of blood received in culture bottles   Culture   Final    NO GROWTH 5 DAYS Performed at [facility], [address] [place], [place] [address]    Report Status 07/31/2024 FINAL  Final    Labs: CBC: Recent Labs  Lab 07/28/24 0530 07/29/24 0427 07/30/24 0351 07/31/24 0515 08/01/24 0200  WBC 8.9 7.5 9.1 8.7 10.7*  HGB 9.7* 9.6* 9.8* 11.2* 11.1*  HCT 29.6* 28.6* 29.5* 32.8* 32.7*  MCV 97.4 96.3 95.8 94.3 94.5  PLT 165 190 235 256 276   Basic Metabolic Panel: Recent Labs  Lab 07/28/24 0530 07/29/24 0427 07/30/24 0351 07/31/24 0515 08/01/24 0200  NA 139 140 140 138 138  K 4.0 4.3 4.2 4.1 4.2  CL 105 105 105 102 102  CO2 28 28 28 25 27   GLUCOSE 150* 209* 177* 185* 128*  BUN 15 15 13 10 14   CREATININE 0.75 0.66 0.66 0.61 0.76  CALCIUM  8.9 9.1 8.9 9.3 9.3  MG 2.4 2.3 2.0 2.2 2.1  PHOS 2.5 2.4* 2.8 2.9 3.5   Liver Function Tests: No results for input(s): AST, ALT, ALKPHOS, BILITOT, PROT, ALBUMIN in the last 168 hours. CBG: Recent Labs  Lab 08/01/24 1955 08/02/24 0033 08/02/24 0419 08/02/24 0824 08/02/24 1143  GLUCAP 189* 153* 122* 155* 263*    Discharge time spent: greater than 30 minutes.  Signed: Sabas GORMAN Brod, MD [facility] 08/02/2024 "

## 2024-08-02 NOTE — Progress Notes (Signed)
 "    Urbano Albright, MD  Physician Physical Medicine and Rehabilitation   PMR Pre-admission    Signed   Date of Service: 08/02/2024  2:02 PM  Related encounter: ED to Hosp-Admission (Current) from 07/25/2024 in [facility]   Signed     Expand All Collapse All  PMR Admission Coordinator Pre-Admission Assessment   Patient: Carolyn Hunter is an 73 y.o., female MRN: [id number] DOB: [birth year] Height: 5' 3 (160 cm) Weight: 67.8 kg   Insurance Information HMO:     PPO: yes      PCP:      IPA:      80/20:      OTHER:  PRIMARY: [employer]      Policy#: [id number]      Subscriber: pt CM Name: Crystal      Phone#: [phone]    Fax#: epic access Pre-Cert#: [id number] auth for CIR from Crystal with [facility] for admit 12/23 with next review date 12/29.  [facility] has epic access    Employer:  Benefits:  Phone #:      Name:  Eff. Date: 08/12/23     Deduct: $0      Out of Pocket Max: $3400 (met $100)      Life Max: n/a CIR: $325/day for days 1-6      SNF: 20 full days Outpatient:      Co-Pay: $15/visit Home Health: 100%      Co-Pay:  DME: 75%     Co-Pay: 25% Providers:  SECONDARY:       Policy#:      Phone#:    Artist:       Phone#:    The Best Boy for patients in Inpatient Rehabilitation Facilities with attached Privacy Act Statement-Health Care Records was provided and verbally reviewed with: Patient and Family   Emergency Contact Information Contact Information       Name Relation Home Work [place] Daughter     [phone]    Conchita, Truxillo     [phone]         Other Contacts       Name Relation Home Work Mobile    [place] Son     [phone]           Current Medical History  Patient Admitting Diagnosis: CVA    History of Present Illness: Pt is a 73 y/o female with PMH of HTN, HLD, and DM, who presented to [facility] on 12/15 after a fall at home with right sided weakness  and AMS.  Per EMS report, pt fell sometime the previous day but was not found until 12/15 (note she lives with her spouse who has dementia).  On arrival to ED her GCS was 8 and she was not protecting her airway so was intubated.  NIHSS was 25 in ED, WBC 13.5, CK 4216.  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  Therapy has been ongoing and pt was recommended for CIR.  Complete NIHSS TOTAL: 1   Patient's medical record from [facility] has been reviewed by the rehabilitation admission coordinator and physician.   Past Medical History      Past Medical History:  Diagnosis Date   Anxiety  Cataract      OS   Diabetes mellitus without complication (HCC)     Diabetic retinopathy (HCC)      NPDR OU   Hyperlipidemia     Hypertension     Hypertensive retinopathy      OU   Tremors of nervous system      just when I get in a nervous Situation.          Has the patient had major surgery during 100 days prior to admission? No   Family History   family history includes Asthma in her father; Breast cancer in her mother; Cancer in her mother; Diabetes in her brother, son, and son.   Current Medications [Current Medications]  [Current Medications]   Current Facility-Administered Medications:    acetaminophen  (TYLENOL ) tablet 650 mg, 650 mg, Per Tube, Q6H PRN, Drusilla, Sabas RAMAN, MD, 650 mg at 08/01/24 1530   amLODipine  (NORVASC ) tablet 10 mg, 10 mg, Per Tube, Daily, Zheng, Michael, DO, 10 mg at 08/02/24 9164   aspirin  chewable tablet 81 mg, 81 mg, Per Tube, Daily, Lera Nancyann NOVAK, DO, 81 mg at 08/02/24 9164   Chlorhexidine  Gluconate Cloth 2 % PADS 6 each, 6 each, Topical, Daily, Hattar, Zola SAILOR, MD, 6 each at 08/02/24 0948    docusate (COLACE) 50 MG/5ML liquid 100 mg, 100 mg, Per Tube, BID PRN, Claudene Toribio BROCKS, MD   enoxaparin  (LOVENOX ) injection 40 mg, 40 mg, Subcutaneous, Q24H, Hattar, Zola SAILOR, MD, 40 mg at 08/02/24 0948   feeding supplement (ENSURE PLUS HIGH PROTEIN) liquid 237 mL, 237 mL, Oral, BID WC, Lama, Sabas RAMAN, MD   feeding supplement (GLUCERNA 1.5 CAL) liquid 1,000 mL, 1,000 mL, Per Tube, Continuous, Drusilla, Sabas RAMAN, MD, Last Rate: 25 mL/hr at 08/01/24 1640, Restarted at 08/01/24 1640   fiber supplement (BANATROL TF) liquid 60 mL, 60 mL, Per Tube, BID, Drusilla, Gagan S, MD, 60 mL at 08/02/24 0835   insulin  aspart (novoLOG ) injection 0-20 Units, 0-20 Units, Subcutaneous, Q4H, Kassie Acquanetta Bradley, MD, 11 Units at 08/02/24 1214   insulin  glargine (LANTUS ) injection 12 Units, 12 Units, Subcutaneous, Daily, Hattar, Zola SAILOR, MD, 12 Units at 08/02/24 0948   labetalol  (NORMODYNE ) injection 10 mg, 10 mg, Intravenous, Q2H PRN, Claudene Toribio BROCKS, MD, 10 mg at 07/30/24 1109   levETIRAcetam  (KEPPRA ) tablet 500 mg, 500 mg, Per Tube, BID, Sethi, Pramod S, MD, 500 mg at 08/02/24 9164   ondansetron  (ZOFRAN ) injection 4 mg, 4 mg, Intravenous, Q6H PRN, Drusilla Sabas RAMAN, MD, 4 mg at 08/02/24 0134   Oral care mouth rinse, 15 mL, Mouth Rinse, PRN, Claudene Toribio BROCKS, MD   Oral care mouth rinse, 15 mL, Mouth Rinse, 4 times per day, Kassie Acquanetta Bradley, MD, 15 mL at 08/02/24 1153   polyethylene glycol (MIRALAX  / GLYCOLAX ) packet 17 g, 17 g, Per Tube, Daily PRN, Claudene Toribio BROCKS, MD   rosuvastatin  (CRESTOR ) tablet 40 mg, 40 mg, Per Tube, Daily, Payne, John D, PA-C, 40 mg at 08/02/24 9164   Patients Current Diet:  Diet Order                  Diet regular Room service appropriate? Yes with Assist; Fluid consistency: Thin  Diet effective now                         Precautions / Restrictions Precautions Precautions: Fall Restrictions Weight Bearing Restrictions Per Provider Order: No    Has the patient  had 2 or more falls or a fall with  injury in the past year? Yes   Prior Activity Level Community (5-7x/wk): independent prior to admit, retired, driving, no DME   Prior Functional Level Self Care: Did the patient need help bathing, dressing, using the toilet or eating? Independent   Indoor Mobility: Did the patient need assistance with walking from room to room (with or without device)? Independent   Stairs: Did the patient need assistance with internal or external stairs (with or without device)? Independent   Functional Cognition: Did the patient need help planning regular tasks such as shopping or remembering to take medications? Independent   Patient Information Are you of Hispanic, Latino/a,or Spanish origin?: A. No, not of Hispanic, Latino/a, or Spanish origin What is your race?: B. Black or African American Do you need or want an interpreter to communicate with a doctor or health care staff?: 0. No   Patient's Response To:  Health Literacy and Transportation Is the patient able to respond to health literacy and transportation needs?: Yes Health Literacy - How often do you need to have someone help you when you read instructions, pamphlets, or other written material from your doctor or pharmacy?: Never In the past 12 months, has lack of transportation kept you from medical appointments or from getting medications?: No In the past 12 months, has lack of transportation kept you from meetings, work, or from getting things needed for daily living?: No   Home Assistive Devices / Equipment Home Equipment: Cane - single point, BSC/3in1   Prior Device Use: Indicate devices/aids used by the patient prior to current illness, exacerbation or injury? None of the above   Current Functional Level Cognition   Arousal/Alertness: Lethargic Overall Cognitive Status: Impaired/Different from baseline Orientation Level: Oriented X4 Attention: Focused, Sustained Focused Attention: Impaired Focused Attention Impairment: Verbal  basic, Functional basic Sustained Attention: Impaired Sustained Attention Impairment: Verbal basic, Functional basic Memory: Impaired Memory Impairment: Storage deficit, Retrieval deficit, Decreased recall of new information, Decreased short term memory Decreased Short Term Memory: Verbal basic, Functional basic Awareness: Impaired Awareness Impairment: Intellectual impairment, Anticipatory impairment, Emergent impairment Problem Solving: Impaired Problem Solving Impairment: Verbal basic, Functional basic Executive Function: Organizing, Reasoning Reasoning: Impaired Reasoning Impairment: Functional basic Organizing: Impaired Organizing Impairment: Functional basic Behaviors: Perseveration Safety/Judgment: Impaired    Extremity Assessment (includes Sensation/Coordination)   Upper Extremity Assessment: RUE deficits/detail RUE Deficits / Details: right hand edema and painful with ROM RUE Sensation: decreased light touch RUE Coordination: decreased fine motor  Lower Extremity Assessment: Defer to PT evaluation LLE Deficits / Details: Generalized weakness but today her LLE is slightly weaker than RLE; able to lift LLE from bed and tolerate only a little resistance. She can however fully bear weight in standing. LLE Sensation:  (Reports normal sensation to light touch BIL LEs)     ADLs   Overall ADL's : Needs assistance/impaired Eating/Feeding: Set up, Sitting Eating/Feeding Details (indicate cue type and reason): able to manage cup Grooming: Wash/dry hands, Wash/dry face, Oral care, Minimal assistance, Sitting Grooming Details (indicate cue type and reason): used RUE as gross assist, declined performing in standing Upper Body Dressing : Minimal assistance, Sitting Upper Body Dressing Details (indicate cue type and reason): gown change Lower Body Dressing: Maximal assistance Lower Body Dressing Details (indicate cue type and reason): socks Toileting- Clothing Manipulation and Hygiene:  Total assistance Toileting - Clothing Manipulation Details (indicate cue type and reason): pt incontinent of bowel on arrival, needs total A for peri hygiene  and to prevent pt from placing hand into stool Functional mobility during ADLs: Minimal assistance, Moderate assistance, Rolling walker (2 wheels)     Mobility   Overal bed mobility: Needs Assistance Bed Mobility: Rolling, Supine to Sit Rolling: Supervision, Used rails Supine to sit: Supervision, HOB elevated, Used rails Sit to supine: Supervision, HOB elevated, Used rails General bed mobility comments: increased time and cues     Transfers   Overall transfer level: Needs assistance Equipment used: Rolling walker (2 wheels) Transfers: Sit to/from Stand Sit to Stand: Contact guard assist Bed to/from chair/wheelchair/BSC transfer type:: Step pivot Step pivot transfers: Mod assist, Min assist General transfer comment: difficulty holding onto RW with RUE, cues for safety with walker use     Ambulation / Gait / Stairs / Wheelchair Mobility   Ambulation/Gait Ambulation/Gait assistance: Contact guard assist Gait Distance (Feet): 170 Feet Assistive device: Rolling walker (2 wheels) Gait Pattern/deviations: Step-through pattern, Decreased stride length, Drifts right/left, Trunk flexed, Wide base of support, Decreased step length - right, Decreased step length - left, Narrow base of support General Gait Details: intermittent assist for RUE and RW management due to tendency for RUE to slip off Gait velocity: decreased Gait velocity interpretation: <1.31 ft/sec, indicative of household ambulator Pre-gait activities: Weight shift, midline control, static march. Min assist with RW for support.     Posture / Balance Dynamic Sitting Balance Sitting balance - Comments: able to maintain static sitting balance with supervision without LOB Balance Overall balance assessment: Needs assistance Sitting-balance support: Feet supported, Bilateral  upper extremity supported Sitting balance-Leahy Scale: Fair Sitting balance - Comments: able to maintain static sitting balance with supervision without LOB Postural control: Right lateral lean Standing balance support: Bilateral upper extremity supported, During functional activity Standing balance-Leahy Scale: Poor Standing balance comment: reliant on RW for support     Special considerations/life events  Diabetic management yes    Previous Home Environment (from acute therapy documentation) Living Arrangements: Spouse/significant other, Children  Lives With: Spouse Available Help at Discharge: Family, Available 24 hours/day Type of Home: House Home Layout: Two level, Able to live on main level with bedroom/bathroom Home Access: Stairs to enter Entrance Stairs-Rails: None Entrance Stairs-Number of Steps: 1 Bathroom Shower/Tub: Engineer, Manufacturing Systems: Standard Home Care Services: No   Discharge Living Setting Plans for Discharge Living Setting: Patient's home, Lives with (comment) (spouse (children to stay with her as well)) Type of Home at Discharge: House Discharge Home Layout: Able to live on main level with bedroom/bathroom Discharge Home Access: Stairs to enter Entrance Stairs-Rails: None Entrance Stairs-Number of Steps: 1 Discharge Bathroom Shower/Tub: Tub/shower unit Discharge Bathroom Toilet: Standard Discharge Bathroom Accessibility: Yes How Accessible: Accessible via walker Does the patient have any problems obtaining your medications?: No   Social/Family/Support Systems Patient Roles: Spouse, Caregiver (spouse has dementia) Anticipated Caregiver: daughter Donnis Pecha is primary contact Anticipated Caregiver's Contact Information: [phone] Ability/Limitations of Caregiver: none stated, reviewed supervision mobility, min assist for ADLs Caregiver Availability: 24/7 Discharge Plan Discussed with Primary Caregiver: Yes Is Caregiver In Agreement with  Plan?: Yes Does Caregiver/Family have Issues with Lodging/Transportation while Pt is in Rehab?: No   Goals Patient/Family Goal for Rehab: PT supervision, OT supervision to min assist, SLP supervision Expected length of stay: 10-12 days Additional Information: Discharge plan: home to pt's previous living environment with her spouse; [name] will move in with them and she will also have assist from her son) Pt/Family Agrees to Admission and willing to participate: Yes Program Orientation Provided &  Reviewed with Pt/Caregiver Including Roles  & Responsibilities: Yes   Decrease burden of Care through IP rehab admission: n/a   Possible need for SNF placement upon discharge:  Not anticipated.    Patient Condition: This patient's condition remains as documented in the consult dated 12/22, in which the Rehabilitation Physician determined and documented that the patient's condition is appropriate for intensive rehabilitative care in an inpatient rehabilitation facility. Will admit to inpatient rehab today.   Preadmission Screen Completed By:  Reche FORBES Lowers, PT, DPT 08/02/2024 2:02 PM ______________________________________________________________________   Discussed status with Dr. Urbano on 08/02/2024  at 08/02/2024  and received approval for admission today.   Admission Coordinator:  Mohammed Mcandrew E Sakina Briones, PT, time 2:25 PM Pattricia 08/02/2024     Assessment/Plan: Diagnosis: CVA Does the need for close, 24 hr/day Medical supervision in concert with the patient's rehab needs make it unreasonable for this patient to be served in a less intensive setting? Yes Co-Morbidities requiring supervision/potential complications: seizures, hand swelling, respiratory failure, R basilar opacity, dysphagia, DM2, anemia,diarrhea Due to bladder management, bowel management, safety, skin/wound care, disease management, medication administration, pain management, and patient education, does the patient require 24 hr/day  rehab nursing? Yes Does the patient require coordinated care of a physician, rehab nurse, PT, OT, and SLP to address physical and functional deficits in the context of the above medical diagnosis(es)? Yes Addressing deficits in the following areas: balance, endurance, locomotion, strength, transferring, bowel/bladder control, bathing, dressing, feeding, grooming, toileting, cognition, speech, language, swallowing, and psychosocial support Can the patient actively participate in an intensive therapy program of at least 3 hrs of therapy 5 days a week? Yes The potential for patient to make measurable gains while on inpatient rehab is excellent Anticipated functional outcomes upon discharge from inpatient rehab: supervision PT, supervision and min assist OT, supervision SLP Estimated rehab length of stay to reach the above functional goals is: 10-12 Anticipated discharge destination: Home 10. Overall Rehab/Functional Prognosis: excellent     MD Signature: Murray Urbano           Revision History  Date/Time User Provider Type Action  08/02/2024  2:35 PM Urbano Murray, MD Physician Sign  08/02/2024  2:25 PM Lowers Reche FORBES, PT Rehab Admission Coordinator Share   View Details Report        "

## 2024-08-02 NOTE — TOC Transition Note (Signed)
 Transition of Care [facility]) - Discharge Note   Patient Details  Name: [name] MRN: [id number] Date of Birth: 08-Apr-1951  [facility]) CM/SW Contact:  Almarie CHRISTELLA Goodie, LCSW Phone Number: 08/02/2024, 1:58 PM   Clinical Narrative:   CSW updated by Rehab Admissions that patient is discharging to CIR today, no further inpatient care management needs at this time.    Final next level of care: IP Rehab Facility     Patient Goals and CMS Choice            Discharge Placement                       Discharge Plan and Services Additional resources added to the After Visit Summary for                                       Social Drivers of Health (SDOH) Interventions SDOH Screenings   Food Insecurity: Patient Unable To Answer (07/26/2024)  Housing: Patient Unable To Answer (07/26/2024)  Transportation Needs: Patient Unable To Answer (07/26/2024)  Utilities: Patient Unable To Answer (07/26/2024)  Depression (PHQ2-9): Low Risk (08/01/2021)  Social Connections: Patient Unable To Answer (07/26/2024)  Tobacco Use: Low Risk (07/25/2024)     Readmission Risk Interventions     No data to display

## 2024-08-02 NOTE — Progress Notes (Signed)
 Occupational Therapy Treatment Patient Details Name: Carolyn Hunter MRN: [id number] DOB: [birth year] Today's Date: 08/02/2024   History of present illness 73 year old female who presented to the hospital 12/15 with fall, right-sided weakness, encephalopathy. Noted to have small left frontotemporal stroke on her MRI. Intubated 12/15-12/19. PMH: hypertension, hyperlipidemia, type 2 diabetes.   OT comments  Patient making good gains with OT treatment with bed mobility, self care, and transfers. Patient demonstrating good AROM at RUE shoulder and elbow and requires AAROM for hand with pain for finger flexion.  Patient will benefit from intensive inpatient follow-up therapy, >3 hours/day.  Acute OT to continue to follow to address established goals to facilitate DC to next venue of care.        If plan is discharge home, recommend the following:  A lot of help with walking and/or transfers;Two people to help with bathing/dressing/bathroom;Assistance with feeding;Assistance with cooking/housework;Direct supervision/assist for medications management;Direct supervision/assist for financial management;Assist for transportation;Help with stairs or ramp for entrance;Supervision due to cognitive status   Equipment Recommendations  Other (comment) (defer to next level of care)    Recommendations for Other Services      Precautions / Restrictions Precautions Precautions: Fall Recall of Precautions/Restrictions: Impaired Restrictions Weight Bearing Restrictions Per Provider Order: No       Mobility Bed Mobility Overal bed mobility: Needs Assistance Bed Mobility: Rolling, Supine to Sit Rolling: Supervision, Used rails   Supine to sit: Supervision, HOB elevated, Used rails     General bed mobility comments: increased time and cues    Transfers Overall transfer level: Needs assistance Equipment used: Rolling walker (2 wheels) Transfers: Sit to/from Stand Sit to Stand: Contact guard assist            General transfer comment: difficulty holding onto RW with RUE, cues for safety with walker use     Balance Overall balance assessment: Needs assistance Sitting-balance support: Feet supported, Bilateral upper extremity supported Sitting balance-Leahy Scale: Fair Sitting balance - Comments: able to maintain static sitting balance with supervision without LOB   Standing balance support: Bilateral upper extremity supported, During functional activity Standing balance-Leahy Scale: Poor Standing balance comment: reliant on RW for support                           ADL either performed or assessed with clinical judgement   ADL Overall ADL's : Needs assistance/impaired Eating/Feeding: Set up;Sitting Eating/Feeding Details (indicate cue type and reason): able to manage cup Grooming: Wash/dry hands;Wash/dry face;Oral care;Minimal assistance;Sitting Grooming Details (indicate cue type and reason): used RUE as gross assist, declined performing in standing         Upper Body Dressing : Minimal assistance;Sitting Upper Body Dressing Details (indicate cue type and reason): gown change Lower Body Dressing: Maximal assistance Lower Body Dressing Details (indicate cue type and reason): socks                    Extremity/Trunk Assessment Upper Extremity Assessment Upper Extremity Assessment: RUE deficits/detail RUE Deficits / Details: right hand edema and painful with ROM RUE Sensation: decreased light touch RUE Coordination: decreased fine motor            Vision       Perception     Praxis     Communication Communication Communication: Impaired Factors Affecting Communication: Reduced clarity of speech   Cognition Arousal: Alert Behavior During Therapy: WFL for tasks assessed/performed Cognition: Cognition impaired  Awareness: Intellectual awareness impaired Memory impairment (select all impairments): Short-term memory, Working  memory Attention impairment (select first level of impairment): Sustained attention Executive functioning impairment (select all impairments): Organization, Problem solving OT - Cognition Comments: impulsive, states she does not recall coming to hospital                 Following commands: Intact Following commands impaired: Follows one step commands with increased time      Cueing   Cueing Techniques: Verbal cues  Exercises Exercises: General Upper Extremity General Exercises - Upper Extremity Shoulder Flexion: AROM, Right, 10 reps, Seated Shoulder ABduction: AROM, Right, 10 reps, Seated Elbow Flexion: AROM, Right, 10 reps, Seated Elbow Extension: AROM, Right, 10 reps, Seated Wrist Flexion: AAROM, Right, 10 reps Wrist Extension: AAROM, Right, 10 reps, Seated Digit Composite Flexion: AAROM, Right, 10 reps, Seated Composite Extension: AAROM, Right, 10 reps, Seated    Shoulder Instructions       General Comments VSS on RA    Pertinent Vitals/ Pain       Pain Assessment Pain Assessment: Faces Faces Pain Scale: Hurts a little bit Pain Location: right hand Pain Descriptors / Indicators: Aching, Throbbing Pain Intervention(s): Limited activity within patient's tolerance, Monitored during session, Repositioned  Home Living                                          Prior Functioning/Environment              Frequency  Min 2X/week        Progress Toward Goals  OT Goals(current goals can now be found in the care plan section)  Progress towards OT goals: Progressing toward goals  Acute Rehab OT Goals Patient Stated Goal: to get stronger OT Goal Formulation: With patient Time For Goal Achievement: 08/14/24 Potential to Achieve Goals: Good ADL Goals Pt Will Perform Eating: with supervision;with adaptive utensils Pt Will Perform Grooming: with contact guard assist;standing Pt Will Transfer to Toilet: with contact guard assist;bedside  commode Pt Will Perform Toileting - Clothing Manipulation and hygiene: with min assist;sitting/lateral leans;sit to/from stand Pt/caregiver will Perform Home Exercise Program: Increased ROM;Increased strength;Right Upper extremity;With Supervision  Plan      Co-evaluation                 AM-PAC OT 6 Clicks Daily Activity     Outcome Measure   Help from another person eating meals?: A Little Help from another person taking care of personal grooming?: A Little Help from another person toileting, which includes using toliet, bedpan, or urinal?: Total Help from another person bathing (including washing, rinsing, drying)?: A Lot Help from another person to put on and taking off regular upper body clothing?: A Lot Help from another person to put on and taking off regular lower body clothing?: A Lot 6 Click Score: 13    End of Session Equipment Utilized During Treatment: Gait belt;Rolling walker (2 wheels)  OT Visit Diagnosis: Unsteadiness on feet (R26.81);Other abnormalities of gait and mobility (R26.89);Muscle weakness (generalized) (M62.81);Other symptoms and signs involving cognitive function   Activity Tolerance Patient tolerated treatment well   Patient Left in chair;with call bell/phone within reach;with chair alarm set   Nurse Communication Mobility status        Time: 9255-9183 OT Time Calculation (min): 32 min  Charges: OT General Charges $OT Visit: 1 Visit OT Treatments $Self Care/Home  Management : 8-22 mins $Therapeutic Exercise: 8-22 mins  Dick Laine, OTA Acute Rehabilitation Services  Office [phone]   Jeb LITTIE Laine 08/02/2024, 12:45 PM

## 2024-08-02 NOTE — PMR Pre-admission (Signed)
 PMR Admission Coordinator Pre-Admission Assessment  Patient: Carolyn Hunter is an 73 y.o., female MRN: [id number] DOB: 1951/05/24 Height: 5' 3 (160 cm) Weight: 67.8 kg  Insurance Information HMO:     PPO: yes      PCP:      IPA:      80/20:      OTHER:  PRIMARY: [employer]      Policy#: [id number]      Subscriber: pt CM Name: Crystal      Phone#: [phone]    Fax#: epic access Pre-Cert#: [id number] auth for CIR from Crystal with [facility] for admit 12/23 with next review date 12/29.  [facility] has epic access    Employer:  Benefits:  Phone #:      Name:  Eff. Date: 08/12/23     Deduct: $0      Out of Pocket Max: $3400 (met $100)      Life Max: n/a CIR: $325/day for days 1-6      SNF: 20 full days Outpatient:      Co-Pay: $15/visit Home Health: 100%      Co-Pay:  DME: 75%     Co-Pay: 25% Providers:  SECONDARY:       Policy#:      Phone#:   Artist:       Phone#:   [employer] for patients in Inpatient Rehabilitation Facilities with attached Privacy Act Statement-Health Care Records was provided and verbally reviewed with: Patient and Family  Emergency Contact Information Contact Information     Name Relation Home Work [place] Daughter   [phone]   Carolyn Hunter, Carolyn Hunter   [phone]      Other Contacts     Name Relation Home Work Mobile   Carolyn Hunter Son   [phone]       Current Medical History  Patient Admitting Diagnosis: CVA   History of Present Illness: Pt is a 73 y/o female with PMH of HTN, HLD, and DM, who presented to [facility] on 12/15 after a fall at home with right sided weakness and AMS.  Per EMS report, pt fell sometime the previous day but was not found until 12/15 (note she lives with her spouse who has dementia).  On arrival to ED her GCS was 8 and she was not protecting her airway so was intubated.  NIHSS was 25 in ED, WBC 13.5, CK 4216.  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  Therapy has been ongoing and pt was recommended for CIR.  Complete NIHSS TOTAL: 1  Patient's medical record from [facility] has been reviewed by the rehabilitation admission coordinator and physician.  Past Medical History  Past Medical History:  Diagnosis Date   Anxiety    Cataract    OS   Diabetes mellitus without complication (HCC)    Diabetic retinopathy (HCC)    NPDR OU   Hyperlipidemia    Hypertension    Hypertensive retinopathy    OU   Tremors of nervous system    just when I get in a nervous Situation.    Has the patient had major surgery during 100 days prior to admission? No  Family History   family history includes Asthma in her father; Breast cancer in her  mother; Cancer in her mother; Diabetes in her brother, son, and son.  Current Medications Current Medications[1]  Patients Current Diet:  Diet Order             Diet regular Room service appropriate? Yes with Assist; Fluid consistency: Thin  Diet effective now                   Precautions / Restrictions Precautions Precautions: Fall Restrictions Weight Bearing Restrictions Per Provider Order: No   Has the patient had 2 or more falls or a fall with injury in the past year? Yes  Prior Activity Level Community (5-7x/wk): independent prior to admit, retired, driving, no DME  Prior Functional Level Self Care: Did the patient need help bathing, dressing, using the toilet or eating? Independent  Indoor Mobility: Did the patient need assistance with walking from room to room (with or without device)? Independent  Stairs: Did the patient need assistance with internal or external stairs (with or without device)? Independent  Functional Cognition: Did  the patient need help planning regular tasks such as shopping or remembering to take medications? Independent  Patient Information Are you of Hispanic, Latino/a,or Spanish origin?: A. No, not of Hispanic, Latino/a, or Spanish origin What is your race?: B. Black or African American Do you need or want an interpreter to communicate with a doctor or health care staff?: 0. No  Patient's Response To:  Health Literacy and Transportation Is the patient able to respond to health literacy and transportation needs?: Yes Health Literacy - How often do you need to have someone help you when you read instructions, pamphlets, or other written material from your doctor or pharmacy?: Never In the past 12 months, has lack of transportation kept you from medical appointments or from getting medications?: No In the past 12 months, has lack of transportation kept you from meetings, work, or from getting things needed for daily living?: No  Home Assistive Devices / Equipment Home Equipment: Cane - single point, BSC/3in1  Prior Device Use: Indicate devices/aids used by the patient prior to current illness, exacerbation or injury? None of the above  Current Functional Level Cognition  Arousal/Alertness: Lethargic Overall Cognitive Status: Impaired/Different from baseline Orientation Level: Oriented X4 Attention: Focused, Sustained Focused Attention: Impaired Focused Attention Impairment: Verbal basic, Functional basic Sustained Attention: Impaired Sustained Attention Impairment: Verbal basic, Functional basic Memory: Impaired Memory Impairment: Storage deficit, Retrieval deficit, Decreased recall of new information, Decreased short term memory Decreased Short Term Memory: Verbal basic, Functional basic Awareness: Impaired Awareness Impairment: Intellectual impairment, Anticipatory impairment, Emergent impairment Problem Solving: Impaired Problem Solving Impairment: Verbal basic, Functional  basic Executive Function: Organizing, Reasoning Reasoning: Impaired Reasoning Impairment: Functional basic Organizing: Impaired Organizing Impairment: Functional basic Behaviors: Perseveration Safety/Judgment: Impaired    Extremity Assessment (includes Sensation/Coordination)  Upper Extremity Assessment: RUE deficits/detail RUE Deficits / Details: right hand edema and painful with ROM RUE Sensation: decreased light touch RUE Coordination: decreased fine motor  Lower Extremity Assessment: Defer to PT evaluation LLE Deficits / Details: Generalized weakness but today her LLE is slightly weaker than RLE; able to lift LLE from bed and tolerate only a little resistance. She can however fully bear weight in standing. LLE Sensation:  (Reports normal sensation to light touch BIL LEs)    ADLs  Overall ADL's : Needs assistance/impaired Eating/Feeding: Set up, Sitting Eating/Feeding Details (indicate cue type and reason): able to manage cup Grooming: Wash/dry hands, Wash/dry face, Oral care, Minimal assistance, Sitting Grooming Details (indicate cue type  and reason): used RUE as gross assist, declined performing in standing Upper Body Dressing : Minimal assistance, Sitting Upper Body Dressing Details (indicate cue type and reason): gown change Lower Body Dressing: Maximal assistance Lower Body Dressing Details (indicate cue type and reason): socks Toileting- Clothing Manipulation and Hygiene: Total assistance Toileting - Clothing Manipulation Details (indicate cue type and reason): pt incontinent of bowel on arrival, needs total A for peri hygiene and to prevent pt from placing hand into stool Functional mobility during ADLs: Minimal assistance, Moderate assistance, Rolling walker (2 wheels)    Mobility  Overal bed mobility: Needs Assistance Bed Mobility: Rolling, Supine to Sit Rolling: Supervision, Used rails Supine to sit: Supervision, HOB elevated, Used rails Sit to supine: Supervision,  HOB elevated, Used rails General bed mobility comments: increased time and cues    Transfers  Overall transfer level: Needs assistance Equipment used: Rolling walker (2 wheels) Transfers: Sit to/from Stand Sit to Stand: Contact guard assist Bed to/from chair/wheelchair/BSC transfer type:: Step pivot Step pivot transfers: Mod assist, Min assist General transfer comment: difficulty holding onto RW with RUE, cues for safety with walker use    Ambulation / Gait / Stairs / Wheelchair Mobility  Ambulation/Gait Ambulation/Gait assistance: Contact guard assist Gait Distance (Feet): 170 Feet Assistive device: Rolling walker (2 wheels) Gait Pattern/deviations: Step-through pattern, Decreased stride length, Drifts right/left, Trunk flexed, Wide base of support, Decreased step length - right, Decreased step length - left, Narrow base of support General Gait Details: intermittent assist for RUE and RW management due to tendency for RUE to slip off Gait velocity: decreased Gait velocity interpretation: <1.31 ft/sec, indicative of household ambulator Pre-gait activities: Weight shift, midline control, static march. Min assist with RW for support.    Posture / Balance Dynamic Sitting Balance Sitting balance - Comments: able to maintain static sitting balance with supervision without LOB Balance Overall balance assessment: Needs assistance Sitting-balance support: Feet supported, Bilateral upper extremity supported Sitting balance-Leahy Scale: Fair Sitting balance - Comments: able to maintain static sitting balance with supervision without LOB Postural control: Right lateral lean Standing balance support: Bilateral upper extremity supported, During functional activity Standing balance-Leahy Scale: Poor Standing balance comment: reliant on RW for support    Special considerations/life events  Diabetic management yes   Previous Home Environment (from acute therapy documentation) Living  Arrangements: Spouse/significant other, Children  Lives With: Spouse Available Help at Discharge: Family, Available 24 hours/day Type of Home: House Home Layout: Two level, Able to live on main level with bedroom/bathroom Home Access: Stairs to enter Entrance Stairs-Rails: None Entrance Stairs-Number of Steps: 1 Bathroom Shower/Tub: Engineer, Manufacturing Systems: Standard Home Care Services: No  Discharge Living Setting Plans for Discharge Living Setting: Patient's home, Lives with (comment) (spouse (children to stay with her as well)) Type of Home at Discharge: House Discharge Home Layout: Able to live on main level with bedroom/bathroom Discharge Home Access: Stairs to enter Entrance Stairs-Rails: None Entrance Stairs-Number of Steps: 1 Discharge Bathroom Shower/Tub: Tub/shower unit Discharge Bathroom Toilet: Standard Discharge Bathroom Accessibility: Yes How Accessible: Accessible via walker Does the patient have any problems obtaining your medications?: No  Social/Family/Support Systems Patient Roles: Spouse, Caregiver (spouse has dementia) Anticipated Caregiver: daughter Carolyn Hunter is primary contact Anticipated Caregiver's Contact Information: [phone] Ability/Limitations of Caregiver: none stated, reviewed supervision mobility, min assist for ADLs Caregiver Availability: 24/7 Discharge Plan Discussed with Primary Caregiver: Yes Is Caregiver In Agreement with Plan?: Yes Does Caregiver/Family have Issues with Lodging/Transportation while Pt is in Rehab?: No  Goals Patient/Family Goal for Rehab: PT supervision, OT supervision to min assist, SLP supervision Expected length of stay: 10-12 days Additional Information: Discharge plan: home to pt's previous living environment with her spouse; [name] will move in with them and she will also have assist from her son) Pt/Family Agrees to Admission and willing to participate: Yes Program Orientation Provided & Reviewed with  Pt/Caregiver Including Roles  & Responsibilities: Yes  Decrease burden of Care through IP rehab admission: n/a  Possible need for SNF placement upon discharge:  Not anticipated.   Patient Condition: This patient's condition remains as documented in the consult dated 12/22, in which the Rehabilitation Physician determined and documented that the patient's condition is appropriate for intensive rehabilitative care in an inpatient rehabilitation facility. Will admit to inpatient rehab today.  Preadmission Screen Completed By:  Reche FORBES Lowers, PT, DPT 08/02/2024 2:02 PM ______________________________________________________________________   Discussed status with Dr. Urbano on 08/02/2024  at 08/02/2024  and received approval for admission today.  Admission Coordinator:  Caitlin E Warren, PT, time 2:25 PM Pattricia 08/02/2024    Assessment/Plan: Diagnosis: CVA Does the need for close, 24 hr/day Medical supervision in concert with the patient's rehab needs make it unreasonable for this patient to be served in a less intensive setting? Yes Co-Morbidities requiring supervision/potential complications: seizures, hand swelling, respiratory failure, R basilar opacity, dysphagia, DM2, anemia,diarrhea Due to bladder management, bowel management, safety, skin/wound care, disease management, medication administration, pain management, and patient education, does the patient require 24 hr/day rehab nursing? Yes Does the patient require coordinated care of a physician, rehab nurse, PT, OT, and SLP to address physical and functional deficits in the context of the above medical diagnosis(es)? Yes Addressing deficits in the following areas: balance, endurance, locomotion, strength, transferring, bowel/bladder control, bathing, dressing, feeding, grooming, toileting, cognition, speech, language, swallowing, and psychosocial support Can the patient actively participate in an intensive therapy program of at least 3  hrs of therapy 5 days a week? Yes The potential for patient to make measurable gains while on inpatient rehab is excellent Anticipated functional outcomes upon discharge from inpatient rehab: supervision PT, supervision and min assist OT, supervision SLP Estimated rehab length of stay to reach the above functional goals is: 10-12 Anticipated discharge destination: Home 10. Overall Rehab/Functional Prognosis: excellent   MD Signature: Murray Urbano     [1]  Current Facility-Administered Medications:    acetaminophen  (TYLENOL ) tablet 650 mg, 650 mg, Per Tube, Q6H PRN, Drusilla Sabas RAMAN, MD, 650 mg at 08/01/24 1530   amLODipine  (NORVASC ) tablet 10 mg, 10 mg, Per Tube, Daily, Zheng, Michael, DO, 10 mg at 08/02/24 9164   aspirin  chewable tablet 81 mg, 81 mg, Per Tube, Daily, Lera Nancyann NOVAK, DO, 81 mg at 08/02/24 9164   Chlorhexidine  Gluconate Cloth 2 % PADS 6 each, 6 each, Topical, Daily, Hattar, Zola SAILOR, MD, 6 each at 08/02/24 0948   docusate (COLACE) 50 MG/5ML liquid 100 mg, 100 mg, Per Tube, BID PRN, Claudene Toribio BROCKS, MD   enoxaparin  (LOVENOX ) injection 40 mg, 40 mg, Subcutaneous, Q24H, Hattar, Zola SAILOR, MD, 40 mg at 08/02/24 0948   feeding supplement (ENSURE PLUS HIGH PROTEIN) liquid 237 mL, 237 mL, Oral, BID WC, Lama, Sabas RAMAN, MD   feeding supplement (GLUCERNA 1.5 CAL) liquid 1,000 mL, 1,000 mL, Per Tube, Continuous, Drusilla, Sabas RAMAN, MD, Last Rate: 25 mL/hr at 08/01/24 1640, Restarted at 08/01/24 1640   fiber supplement (BANATROL TF) liquid 60 mL, 60 mL, Per Tube, BID, Drusilla, Gagan  S, MD, 60 mL at 08/02/24 0835   insulin  aspart (novoLOG ) injection 0-20 Units, 0-20 Units, Subcutaneous, Q4H, Kassie Acquanetta Bradley, MD, 11 Units at 08/02/24 1214   insulin  glargine (LANTUS ) injection 12 Units, 12 Units, Subcutaneous, Daily, Hattar, Zola SAILOR, MD, 12 Units at 08/02/24 0948   labetalol  (NORMODYNE ) injection 10 mg, 10 mg, Intravenous, Q2H PRN, Claudene Toribio BROCKS, MD, 10 mg at 07/30/24 1109   levETIRAcetam   (KEPPRA ) tablet 500 mg, 500 mg, Per Tube, BID, Sethi, Pramod S, MD, 500 mg at 08/02/24 9164   ondansetron  (ZOFRAN ) injection 4 mg, 4 mg, Intravenous, Q6H PRN, Drusilla Sabas RAMAN, MD, 4 mg at 08/02/24 0134   Oral care mouth rinse, 15 mL, Mouth Rinse, PRN, Claudene Toribio BROCKS, MD   Oral care mouth rinse, 15 mL, Mouth Rinse, 4 times per day, Kassie Acquanetta Bradley, MD, 15 mL at 08/02/24 1153   polyethylene glycol (MIRALAX  / GLYCOLAX ) packet 17 g, 17 g, Per Tube, Daily PRN, Claudene Toribio BROCKS, MD   rosuvastatin  (CRESTOR ) tablet 40 mg, 40 mg, Per Tube, Daily, Payne, John D, PA-C, 40 mg at 08/02/24 [id number]

## 2024-08-02 NOTE — Progress Notes (Signed)
 Dr. Alvan to read, for stroke

## 2024-08-02 NOTE — H&P (Signed)
 "   Physical Medicine and Rehabilitation Admission H&P    Chief Complaint  Patient presents with   Functional deficits due to CVA   HPI: Carolyn Hunter is a 73 year old female with PMHx hypertension, hyperlipidemia, type 2 diabetes who presented to [facility] on 12/15 with acute encephalopathy.  Per EMS report the patient was found down on the ground after a fall and unresponsive.  She was not found until 12/15 (patient lives with her spouse who has dementia).  Upon arrival to ER patient exhibited altered mental status with GCS of 8, O2 sats 90% on room air. She was placed on nonrebreather but unable to protect airway for requiring intubation.  Initial NIHSS 25.  Labs: Glucose 400s, WBC 13.5, CK 4216, lactic acid 4.7 UA positive for UTI.  Bruising noted to the right periorbital area and swelling of right hand and forearm.  No other obvious injuries or overt infection.  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  Code activation delayed due to unknown LKW but patient exhibited inability to move right upper extremity.  Neurology was consulted who activated code stroke.  Follow-up CTA without LVO.  Neurology recommended EEG and MRI. EEG showed epileptic discharges and the patient was loaded with Keppra  without recurrent seizures. 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 2D echocardiogram showed LVEF 60 to 65%, atria normal in size.  LDL 145 and hemoglobin A1c 6.4.  Aspirin  81 mg daily and Lovenox  for DVT prophylaxis.  Hospital course complicated by encephalopathy, acute hypoxic respiratory failure, chest x-ray revealed right basilar opacity and concerns for aspiration pneumonia.  Blood cultures with streptococcal growth, repeat blood cultures done 12/16 showed no growth suspected contamination. She was placed on 5 day course of IV ceftriaxone .  Extubated on 07/29/2024, she was n.p.o.,  Cortrak placed.  Speech therapy followed and conducted slums test that revealed score of 7/26, severe cognitive impairment.  Swallowing improved and the patient was started on D3 thin liquid diet on 12/21.  Pt has had diarrhea, thought to be due to laxative/antibiotic/tube feed related, started on fiber.   Prior to arrival the patient was independent, retired and driving. She lives in a two-level home with the ability to live on the main level with her spouse.  OT evaluated and patient demonstrated total assist lower body ADLs min to mod assist for upper body ADLs and min assist with transfer. Therapy evaluations completed due to patient decreased functional mobility was admitted for a comprehensive rehab program.       Review of Systems  Constitutional:  Negative for chills and fever.  HENT:  Negative for hearing loss.   Eyes:  Negative for blurred vision and double vision.  Respiratory:  Negative for shortness of breath.   Cardiovascular:  Negative for chest pain.  Gastrointestinal:  Positive for diarrhea and nausea. Negative for abdominal pain, constipation and vomiting.  Genitourinary: Negative.   Musculoskeletal:  Negative for back pain, joint pain and neck pain.       L arm swelling  Skin:  Negative for rash.  Neurological:  Positive for sensory change and speech change (improved). Negative for dizziness and headaches.  Psychiatric/Behavioral:  The patient does not have insomnia.         Past Medical History:  Diagnosis Date   Anxiety    Cataract    OS   Diabetes mellitus without complication (HCC)    Diabetic  retinopathy (HCC)    NPDR OU   Hyperlipidemia    Hypertension    Hypertensive retinopathy    OU   Tremors of nervous system    just when I get in a nervous Situation.   Past Surgical History:  Procedure Laterality Date   CATARACT EXTRACTION W/PHACO Right 10/18/2018   Procedure: CATARACT EXTRACTION PHACO AND INTRAOCULAR LENS PLACEMENT (IOC);  Surgeon: Harrie Agent, MD;  Location: [facility];  Service: Ophthalmology;  Laterality: Right;  CDE: 8.11   CATARACT EXTRACTION W/PHACO Left 01/17/2019   Procedure: CATARACT EXTRACTION PHACO AND INTRAOCULAR LENS PLACEMENT (IOC);  Surgeon: Harrie Agent, MD;  Location: [facility];  Service: Ophthalmology;  Laterality: Left;  CDE: 6.38   CESAREAN SECTION     1988   COLONOSCOPY WITH PROPOFOL  N/A 05/04/2020   Procedure: COLONOSCOPY WITH PROPOFOL ;  Surgeon: Eartha Angelia Sieving, MD;  Location: [facility];  Service: Gastroenterology;  Laterality: N/A;  830   EYE SURGERY Right    Cataract extraction OD   POLYPECTOMY  05/04/2020   Procedure: POLYPECTOMY;  Surgeon: Eartha Angelia Sieving, MD;  Location: [facility];  Service: Gastroenterology;;   Family History  Problem Relation Age of Onset   Cancer Mother        breast and colon cancer   Breast cancer Mother    Diabetes Brother    Diabetes Son    Asthma Father        COPD   Diabetes Son    Social History:  reports that she has never smoked. She has never used smokeless tobacco. She reports that she does not drink alcohol  and does not use drugs. Allergies: Allergies[1] Medications Prior to Admission  Medication Sig Dispense Refill   amLODipine  (NORVASC ) 10 MG tablet Take 10 mg by mouth daily.     losartan  (COZAAR ) 25 MG tablet Take 25 mg by mouth daily.     metFORMIN  (GLUCOPHAGE ) 1000 MG tablet TAKE 1 TABLET BY MOUTH EVERY DAY WITH BREAKFAST (Patient taking differently: Take 1,000 mg by mouth 2 (two) times daily.) 90 tablet 1   rosuvastatin  (CRESTOR ) 5 MG tablet Take 5 mg by mouth every Monday, Wednesday, and Friday.        Home: Home Living Family/patient expects to be discharged to:: Inpatient rehab Living Arrangements: Spouse/significant other, Children Available Help at Discharge: Family, Available 24 hours/day Type of Home: House Home Access: Stairs to enter [employer] of Steps: 1 Entrance Stairs-Rails: None Home Layout: Two  level, Able to live on main level with bedroom/bathroom Bathroom Shower/Tub: Engineer, Manufacturing Systems: Standard Home Equipment: Medical Laboratory Scientific Officer - single point, BSC/3in1  Lives With: Spouse   Functional History: Prior Function Prior Level of Function : Independent/Modified Independent, Driving Mobility Comments: no AD PTA ADLs Comments: indep with ADLs, + driving, normally very active at baseline  Functional Status:  Mobility: Bed Mobility Overal bed mobility: Needs Assistance Bed Mobility: Rolling, Supine to Sit Rolling: Supervision, Used rails Supine to sit: Supervision, HOB elevated, Used rails Sit to supine: Supervision, HOB elevated, Used rails General bed mobility comments: increased time and cues Transfers Overall transfer level: Needs assistance Equipment used: Rolling walker (2 wheels) Transfers: Sit to/from Stand Sit to Stand: Contact guard assist Bed to/from chair/wheelchair/BSC transfer type:: Step pivot Step pivot transfers: Mod assist, Min assist General transfer comment: difficulty holding onto RW with RUE, cues for safety with walker use Ambulation/Gait Ambulation/Gait assistance: Contact guard assist Gait Distance (Feet): 170 Feet Assistive device: Rolling walker (2 wheels) Gait Pattern/deviations:  Step-through pattern, Decreased stride length, Drifts right/left, Trunk flexed, Wide base of support, Decreased step length - right, Decreased step length - left, Narrow base of support General Gait Details: intermittent assist for RUE and RW management due to tendency for RUE to slip off Gait velocity: decreased Gait velocity interpretation: <1.31 ft/sec, indicative of household ambulator Pre-gait activities: Weight shift, midline control, static march. Min assist with RW for support.    ADL: ADL Overall ADL's : Needs assistance/impaired Eating/Feeding: Set up, Sitting Eating/Feeding Details (indicate cue type and reason): able to manage cup Grooming: Wash/dry  hands, Wash/dry face, Oral care, Minimal assistance, Sitting Grooming Details (indicate cue type and reason): used RUE as gross assist, declined performing in standing Upper Body Dressing : Minimal assistance, Sitting Upper Body Dressing Details (indicate cue type and reason): gown change Lower Body Dressing: Maximal assistance Lower Body Dressing Details (indicate cue type and reason): socks Toileting- Clothing Manipulation and Hygiene: Total assistance Toileting - Clothing Manipulation Details (indicate cue type and reason): pt incontinent of bowel on arrival, needs total A for peri hygiene and to prevent pt from placing hand into stool Functional mobility during ADLs: Minimal assistance, Moderate assistance, Rolling walker (2 wheels)  Cognition: Cognition Overall Cognitive Status: Impaired/Different from baseline Arousal/Alertness: Lethargic Orientation Level: Oriented X4 Year: Other (Comment) (8044) Month: December Day of Week: Incorrect (correct given choice of two) Attention: Focused, Sustained Focused Attention: Impaired Focused Attention Impairment: Verbal basic, Functional basic Sustained Attention: Impaired Sustained Attention Impairment: Verbal basic, Functional basic Memory: Impaired Memory Impairment: Storage deficit, Retrieval deficit, Decreased recall of new information, Decreased short term memory Decreased Short Term Memory: Verbal basic, Functional basic Awareness: Impaired Awareness Impairment: Intellectual impairment, Anticipatory impairment, Emergent impairment Problem Solving: Impaired Problem Solving Impairment: Verbal basic, Functional basic Executive Function: Organizing, Reasoning Reasoning: Impaired Reasoning Impairment: Functional basic Organizing: Impaired Organizing Impairment: Functional basic Behaviors: Perseveration Safety/Judgment: Impaired Cognition Arousal: Alert Behavior During Therapy: WFL for tasks assessed/performed Overall Cognitive  Status: Impaired/Different from baseline  Physical Exam: Blood pressure 99/68, pulse 95, temperature 98.1 F (36.7 C), temperature source Oral, resp. rate 15, height 5' 3 (1.6 m), weight 67.8 kg, SpO2 94%. Physical Exam  General: No apparent distress HEENT: Head is normocephalic, sclera anicteric, oral mucosa a little dry, + NG tube, some bruising around her eyes Neck: Supple without JVD or lymphadenopathy Heart: Reg rate and rhythm. No murmurs rubs or gallops Chest: CTA bilaterally without wheezes, rales, or rhonchi; no distress Abdomen: Soft, non-tender, non-distended, bowel sounds positive. Extremities: RUE edema Psych: Pt's affect is appropriate. Pt is cooperative Skin: Clean and intact without signs of breakdown Neuro:    Mental Status: AAOx4, can provide birthdate Speech/Languate: Naming and repetition intact, fluent, follows simple commands, speech a little delayed CRANIAL NERVES: II: PERRL. Visual fields full III, IV, VI: EOM intact, no gaze preference or deviation V: normal sensation bilaterally VII: no asymmetry VIII: normal hearing to speech IX, X: normal palatal elevation XI: 5/5 head turn and 5/5 shoulder shrug bilaterally XII: Tongue midline   MOTOR: RUE: 4-/5 Deltoid, 4-/5 Biceps, 4-/5 Triceps, 4-/5 Grip LUE: 4+/5 Deltoid, 5/5 Biceps, 5/5 Triceps, 5/5 Grip RLE: HF 4/5, KE 5/5, ADF 4/5, APF 4/5 LLE: HF 4/5, KE 5/5, ADF 4/5, APF 4/5  No ankle clonus  No hypertonia noted   SENSORY: Altered in RUE to LT  Coordination: Normal finger to nose normal LUE, RUE appears intact given weakness   Results for orders placed or performed during the hospital encounter of 07/25/24 (from  the past 48 hours)  Glucose, capillary     Status: Abnormal   Collection Time: 07/31/24  4:38 PM  Result Value Ref Range   Glucose-Capillary 277 (H) 70 - 99 mg/dL    Comment: Glucose reference range applies only to samples taken after fasting for at least 8 hours.   Comment 1 Notify RN    Glucose, capillary     Status: Abnormal   Collection Time: 07/31/24  7:43 PM  Result Value Ref Range   Glucose-Capillary 143 (H) 70 - 99 mg/dL    Comment: Glucose reference range applies only to samples taken after fasting for at least 8 hours.   Comment 1 Notify RN    Comment 2 Document in Chart   Glucose, capillary     Status: Abnormal   Collection Time: 07/31/24 11:30 PM  Result Value Ref Range   Glucose-Capillary 132 (H) 70 - 99 mg/dL    Comment: Glucose reference range applies only to samples taken after fasting for at least 8 hours.   Comment 1 Notify RN    Comment 2 Document in Chart   Basic metabolic panel with GFR     Status: Abnormal   Collection Time: 08/01/24  2:00 AM  Result Value Ref Range   Sodium 138 135 - 145 mmol/L   Potassium 4.2 3.5 - 5.1 mmol/L   Chloride 102 98 - 111 mmol/L   CO2 27 22 - 32 mmol/L   Glucose, Bld 128 (H) 70 - 99 mg/dL    Comment: Glucose reference range applies only to samples taken after fasting for at least 8 hours.   BUN 14 8 - 23 mg/dL   Creatinine, Ser 9.23 0.44 - 1.00 mg/dL   Calcium  9.3 8.9 - 10.3 mg/dL   GFR, Estimated >39 >39 mL/min    Comment: (NOTE) Calculated using the CKD-EPI Creatinine Equation (2021)    Anion gap 9 5 - 15    Comment: Performed at [facility], [address], [place], [place] [address]  CBC     Status: Abnormal   Collection Time: 08/01/24  2:00 AM  Result Value Ref Range   WBC 10.7 (H) 4.0 - 10.5 K/uL   RBC 3.46 (L) 3.87 - 5.11 MIL/uL   Hemoglobin 11.1 (L) 12.0 - 15.0 g/dL   HCT 67.2 (L) 63.9 - 53.9 %   MCV 94.5 80.0 - 100.0 fL   MCH 32.1 26.0 - 34.0 pg   MCHC 33.9 30.0 - 36.0 g/dL   RDW 88.0 88.4 - 84.4 %   Platelets 276 150 - 400 K/uL   nRBC 0.7 (H) 0.0 - 0.2 %    Comment: Performed at [facility], [address] [place], [place] [address]  Magnesium      Status: None   Collection Time: 08/01/24  2:00 AM  Result Value Ref Range   Magnesium  2.1 1.7 - 2.4 mg/dL    Comment: Performed  at [facility], [address], [place], [place] [address]  Phosphorus     Status: None   Collection Time: 08/01/24  2:00 AM  Result Value Ref Range   Phosphorus 3.5 2.5 - 4.6 mg/dL    Comment: Performed at [facility], [address] [place], [place] [address]  Glucose, capillary     Status: Abnormal   Collection Time: 08/01/24  4:28 AM  Result Value Ref Range   Glucose-Capillary 152 (H) 70 - 99 mg/dL    Comment: Glucose reference  range applies only to samples taken after fasting for at least 8 hours.   Comment 1 Notify RN    Comment 2 Document in Chart   Glucose, capillary     Status: Abnormal   Collection Time: 08/01/24  8:21 AM  Result Value Ref Range   Glucose-Capillary 161 (H) 70 - 99 mg/dL    Comment: Glucose reference range applies only to samples taken after fasting for at least 8 hours.  Glucose, capillary     Status: Abnormal   Collection Time: 08/01/24 11:28 AM  Result Value Ref Range   Glucose-Capillary 135 (H) 70 - 99 mg/dL    Comment: Glucose reference range applies only to samples taken after fasting for at least 8 hours.  Glucose, capillary     Status: Abnormal   Collection Time: 08/01/24  3:23 PM  Result Value Ref Range   Glucose-Capillary 180 (H) 70 - 99 mg/dL    Comment: Glucose reference range applies only to samples taken after fasting for at least 8 hours.  Glucose, capillary     Status: Abnormal   Collection Time: 08/01/24  7:55 PM  Result Value Ref Range   Glucose-Capillary 189 (H) 70 - 99 mg/dL    Comment: Glucose reference range applies only to samples taken after fasting for at least 8 hours.   Comment 1 Notify RN   Glucose, capillary     Status: Abnormal   Collection Time: 08/02/24 12:33 AM  Result Value Ref Range   Glucose-Capillary 153 (H) 70 - 99 mg/dL    Comment: Glucose reference range applies only to samples taken after fasting for at least 8 hours.   Comment 1 Notify RN   Glucose, capillary     Status: Abnormal   Collection  Time: 08/02/24  4:19 AM  Result Value Ref Range   Glucose-Capillary 122 (H) 70 - 99 mg/dL    Comment: Glucose reference range applies only to samples taken after fasting for at least 8 hours.   Comment 1 Notify RN   Glucose, capillary     Status: Abnormal   Collection Time: 08/02/24  8:24 AM  Result Value Ref Range   Glucose-Capillary 155 (H) 70 - 99 mg/dL    Comment: Glucose reference range applies only to samples taken after fasting for at least 8 hours.  CK     Status: Abnormal   Collection Time: 08/02/24  8:57 AM  Result Value Ref Range   Total CK 321 (H) 38 - 234 U/L    Comment: Performed at [facility], [address] [place], [place] [address]  Glucose, capillary     Status: Abnormal   Collection Time: 08/02/24 11:43 AM  Result Value Ref Range   Glucose-Capillary 263 (H) 70 - 99 mg/dL    Comment: Glucose reference range applies only to samples taken after fasting for at least 8 hours.   No results found.    Blood pressure 99/68, pulse 95, temperature 98.1 F (36.7 C), temperature source Oral, resp. rate 15, height 5' 3 (1.6 m), weight 67.8 kg, SpO2 94%.  Medical Problem List and Plan: 1. 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 Zio patch at discharge.  -patient may shower  -ELOS/Goals: 10-12 days, PT sup, OT sup to min a, SLP sup  -Admit to CIR  2.  Antithrombotics: -DVT/anticoagulation:  Mechanical: Sequential compression devices, below knee Bilateral lower extremities Pharmaceutical: Lovenox   -antiplatelet therapy: Aspirin    3. Pain Management: Tylenol  prn  4. Mood/Behavior/Sleep: LCSW to follow for evaluation and support when available.   -antipsychotic agents: n/a  5. Neuropsych/cognition: This patient is capable of making decisions on her own behalf.  6. Skin/Wound Care: Routine pressure relief measures.  Bruising noted to the right eye with ecchymosis.  CT head with R lateral periorbital hematoma.    7. Fluids/Electrolytes/Nutrition: Monitor I&O and weight. Follow up labs CBC/CMP    -SLP and RD consult for Cortrak mgt. Regular diet started 12/23-consider stopping tube feeds if intake improved.   GERD: On PPI    8. 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 Will need Zioptach at discharge.    -Continue statin and aspirin .    9. Seizures: EEG 12/17 with epileptogenicity arising from the left central temporoparietal region. EEG 12/18 w/ moderate diffuse encephalopathy.   -Continue Keppra  500 mg BID.   10. Acute hypoxic respiratory failure: Cxr concerning for aspiration PNA. Blood cultures 12/15 w/ aerococcus viridans in 1 bottle, suspect contaminant, 12/16 Bcx no growth.   -completed 5 day course of ceftriaxone    -encourage incentive spiro.   11. T2DM: A1c 6.4. CBG this AM 170.  -monitor cbg ac/hs with SSI novolog  and Lantus  12 units daily.   12. Normocytic anemia: Copious blood noted during ETT exchange and bronchoscopy w/o acute bleed.   -Hgb 9.6 continue to monitor and trend CBC.   13. HLD: LDL 145, Crestor  40 mg.  14.  Diarrhea: Recent laxative antibiotic use.   - Continue to monitor output--fiber supplement.  15. R hand swelling:  -Had xray that did not show fracture, Check Vas US   16.HTN:  Long term goal 130/90  -Home meds norvasc /propranolol , monitor trend  Daphne LOISE Satterfield, NP 08/02/2024  I have personally performed a face to face diagnostic evaluation of this patient and formulated the key components of the plan.  Additionally, I have personally reviewed laboratory data, imaging studies, as well as relevant notes and concur with the physician assistant's documentation above.  The patient's status has not changed from the original H&P.  Any changes in documentation from the acute care chart have been noted above.  Murray Collier, MD     [1]  Allergies Allergen Reactions   Ace Inhibitors Cough    Zetia  [Ezetimibe ] Swelling and Other (See Comments)    Sore throat   "

## 2024-08-02 NOTE — Progress Notes (Signed)
 Inpatient Rehab Admissions Coordinator:   Awaiting determination from HTA regarding prior auth request.  WIll follow.

## 2024-08-02 NOTE — Progress Notes (Signed)
 Speech Language Pathology Treatment: Dysphagia;Cognitive-Linguistic  Patient Details Name: Carolyn Hunter MRN: [id number] DOB: 12-24-1950 Today's Date: 08/02/2024 Time: 8960-8941 SLP Time Calculation (min) (ACUTE ONLY): 19 min  Assessment / Plan / Recommendation Clinical Impression  Treatment focused on dysphagia and cognition with spouse and sons present. Pt states she was little nauseous this am and was yesterday. Her oropharyngeal swallow from clinical observation was functional and masticated upgraded trial of regular texture with efficient mastication and clearance. No indications of pharyngeal impairments noted with solids or thin and family denies coughing with po's. Upgraded to regular, continue thin, pills with thin. She stated her taste buds are off, encouraged to order foods she prefers. Followed by RD and hopeful for increased po for removal of Cortrak when appropriate. Oriented to month, holiday, DOW needing min prompts to problem solve how many days until Christmas and required one verbal prompt out of 3 trials for functional temporal reasoning task. Recalled independently plan and location for rehab (4th floor) and that she needs to improve right arm movement (did not state cognition). ST to follow for cognition and once more for safety/efficiency with upgraded texture.    HPI HPI: 73 year old female who presented to the hospital 12/15 with fall, right-sided weakness, encephalopathy. Noted to have small left frontotemporal stroke on her MRI. Intubated 12/15-12/19. PMH: hypertension, hyperlipidemia, type 2 diabetes.      SLP Plan  Continue with current plan of care        Swallow Evaluation Recommendations   Recommendations: PO diet PO Diet Recommendation: Regular;Thin liquids (Level 0) Liquid Administration via: Cup;Straw Medication Administration: Whole meds with liquid Supervision: Staff to assist with self-feeding Postural changes: Position pt fully upright for  meals Oral care recommendations: Oral care BID (2x/day)     Recommendations                   Rehab consult Oral care BID   Intermittent Supervision/Assistance Dysphagia, unspecified (R13.10);Cognitive communication deficit (R41.841)     Continue with current plan of care     Dustin Olam Bull  08/02/2024, 11:22 AM

## 2024-08-02 NOTE — Progress Notes (Signed)
 Physical Therapy Treatment Patient Details Name: Carolyn Hunter MRN: [id number] DOB: 1951-05-10 Today's Date: 08/02/2024   History of Present Illness 73 year old female who presented to the hospital 12/15 with fall, right-sided weakness, encephalopathy. Noted to have small left frontotemporal stroke on her MRI. Intubated 12/15-12/19. PMH: hypertension, hyperlipidemia, type 2 diabetes.    PT Comments  Received pt semi-reclined in bed with family at bedside. Pt agreeable to PT treatment and reported mild pain in R hand (noted edema - elevated RUE and provided ice pack). Pt performed bed mobility with supervision using bed features and transfers with RW and CGA throughout session. Pt ambulated 169ft with RW and CGA (occasional assist for RUE and RW management due to R hand slipping off). Returned to room and performed the following standing exercises for global strengthening/endurance: sit<>stands x5, alternating marches x10, hip abduction x10, heel raises x10, and mini squats x10. Pt remains appropriate for continued therapy >3 hours/day to address current deficits. Acute PT to cont to follow.    If plan is discharge home, recommend the following: Assistance with cooking/housework;Direct supervision/assist for medications management;Direct supervision/assist for financial management;Help with stairs or ramp for entrance;Assist for transportation;Supervision due to cognitive status;A little help with walking and/or transfers;A little help with bathing/dressing/bathroom   Can travel by private vehicle        Equipment Recommendations  Rolling walker (2 wheels)    Recommendations for Other Services Rehab consult     Precautions / Restrictions Precautions Precautions: Fall Recall of Precautions/Restrictions: Impaired Restrictions Weight Bearing Restrictions Per Provider Order: No     Mobility  Bed Mobility Overal bed mobility: Needs Assistance Bed Mobility: Rolling, Supine to Sit, Sit to  Supine Rolling: Supervision, Used rails   Supine to sit: Supervision, HOB elevated, Used rails Sit to supine: Supervision, HOB elevated, Used rails   General bed mobility comments: ++ time and heavy reliance on bedrails but no physical assist Patient Response: Cooperative  Transfers Overall transfer level: Needs assistance Equipment used: Rolling walker (2 wheels) Transfers: Sit to/from Stand Sit to Stand: Contact guard assist           General transfer comment: per performed all transfers with RW and CGA (assisional assist for RUE due to tendency to slip off RW).    Ambulation/Gait Ambulation/Gait assistance: Contact guard assist Gait Distance (Feet): 170 Feet Assistive device: Rolling walker (2 wheels) Gait Pattern/deviations: Step-through pattern, Decreased stride length, Drifts right/left, Trunk flexed, Wide base of support, Decreased step length - right, Decreased step length - left, Narrow base of support Gait velocity: decreased Gait velocity interpretation: <1.31 ft/sec, indicative of household ambulator   General Gait Details: intermittent assist for RUE and RW management due to tendency for RUE to slip off   Stairs             Wheelchair Mobility     Tilt Bed Tilt Bed Patient Response: Cooperative  Modified Rankin (Stroke Patients Only) Modified Rankin (Stroke Patients Only) Pre-Morbid Rankin Score: No symptoms Modified Rankin: Moderately severe disability     Balance Overall balance assessment: Needs assistance Sitting-balance support: Feet supported, Bilateral upper extremity supported Sitting balance-Leahy Scale: Fair Sitting balance - Comments: able to maintain static sitting balance with supervision without LOB   Standing balance support: Reliant on assistive device for balance, Bilateral upper extremity supported, During functional activity (RW) Standing balance-Leahy Scale: Poor Standing balance comment: able to maintain static and  dynamic standing balance with CGA for balance  Communication Communication Communication: Impaired Factors Affecting Communication: Reduced clarity of speech  Cognition Arousal: Alert Behavior During Therapy: WFL for tasks assessed/performed                             Following commands: Intact Following commands impaired: Follows one step commands with increased time    Cueing Cueing Techniques: Verbal cues  Exercises General Exercises - Lower Extremity Hip ABduction/ADduction: AROM, Both, 10 reps, Standing Hip Flexion/Marching: AROM, Both, 10 reps, Standing Heel Raises: AROM, Both, 10 reps Mini-Sqauts: AROM, 15 reps    General Comments General comments (skin integrity, edema, etc.): family present at bedside and encouraging      Pertinent Vitals/Pain Pain Assessment Pain Assessment: Faces Faces Pain Scale: Hurts a little bit Pain Location: right hand Pain Descriptors / Indicators: Aching, Throbbing Pain Intervention(s): Monitored during session, Limited activity within patient's tolerance, Repositioned, Ice applied    Home Living                          Prior Function            PT Goals (current goals can now be found in the care plan section) Acute Rehab PT Goals Patient Stated Goal: get well, go home PT Goal Formulation: With patient/family Time For Goal Achievement: 08/13/24 Potential to Achieve Goals: Good Progress towards PT goals: Progressing toward goals    Frequency    Min 3X/week      PT Plan      Co-evaluation              AM-PAC PT 6 Clicks Mobility   Outcome Measure  Help needed turning from your back to your side while in a flat bed without using bedrails?: A Little Help needed moving from lying on your back to sitting on the side of a flat bed without using bedrails?: A Little Help needed moving to and from a bed to a chair (including a wheelchair)?: A Little Help  needed standing up from a chair using your arms (e.g., wheelchair or bedside chair)?: A Little Help needed to walk in hospital room?: A Little Help needed climbing 3-5 steps with a railing? : Total 6 Click Score: 16    End of Session Equipment Utilized During Treatment: Gait belt Activity Tolerance: Patient tolerated treatment well Patient left: with call bell/phone within reach;with family/visitor present;in bed;with bed alarm set Nurse Communication: Mobility status PT Visit Diagnosis: Unsteadiness on feet (R26.81);Other abnormalities of gait and mobility (R26.89);Muscle weakness (generalized) (M62.81);History of falling (Z91.81);Ataxic gait (R26.0);Difficulty in walking, not elsewhere classified (R26.2);Other symptoms and signs involving the nervous system (R29.898);Hemiplegia and hemiparesis Hemiplegia - Right/Left: Right Hemiplegia - dominant/non-dominant: Dominant     Time: 8891-8868 PT Time Calculation (min) (ACUTE ONLY): 23 min  Charges:    $Therapeutic Exercise: 8-22 mins $Therapeutic Activity: 8-22 mins PT General Charges $$ ACUTE PT VISIT: 1 Visit                     Therisa Stains PT, DPT Therisa HERO Zaunegger 08/02/2024, 12:09 PM

## 2024-08-02 NOTE — H&P (Signed)
 "       Physical Medicine and Rehabilitation Admission H&P        Chief Complaint  Patient presents with   Functional deficits due to CVA    HPI: Carolyn Hunter is a 73 year old female with PMHx hypertension, hyperlipidemia, type 2 diabetes who presented to [facility] on 12/15 with acute encephalopathy.  Per EMS report the patient was found down on the ground after a fall and unresponsive.  She was not found until 12/15 (patient lives with her spouse who has dementia).  Upon arrival to ER patient exhibited altered mental status with GCS of 8, O2 sats 90% on room air. She was placed on nonrebreather but unable to protect airway for requiring intubation.  Initial NIHSS 25.  Labs: Glucose 400s, WBC 13.5, CK 4216, lactic acid 4.7 UA positive for UTI.  Bruising noted to the right periorbital area and swelling of right hand and forearm.  No other obvious injuries or overt infection.   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  Code activation delayed due to unknown LKW but patient exhibited inability to move right upper extremity.  Neurology was consulted who activated code stroke.  Follow-up CTA without LVO.  Neurology recommended EEG and MRI. EEG showed epileptic discharges and the patient was loaded with Keppra  without recurrent seizures. 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 2D echocardiogram showed LVEF 60 to 65%, atria normal in size.  LDL 145 and hemoglobin A1c 6.4.  Aspirin  81 mg daily and Lovenox  for DVT prophylaxis.   Hospital course complicated by encephalopathy, acute hypoxic respiratory failure, chest x-ray revealed right basilar opacity and concerns for aspiration pneumonia.  Blood cultures with streptococcal growth, repeat blood cultures done 12/16 showed no growth suspected contamination. She was placed on 5 day course of IV ceftriaxone .  Extubated on 07/29/2024, she  was n.p.o., Cortrak placed.  Speech therapy followed and conducted slums test that revealed score of 7/26, severe cognitive impairment.  Swallowing improved and the patient was started on D3 thin liquid diet on 12/21.  Pt has had diarrhea, thought to be due to laxative/antibiotic/tube feed related, started on fiber.    Prior to arrival the patient was independent, retired and driving. She lives in a two-level home with the ability to live on the main level with her spouse.  OT evaluated and patient demonstrated total assist lower body ADLs min to mod assist for upper body ADLs and min assist with transfer. Therapy evaluations completed due to patient decreased functional mobility was admitted for a comprehensive rehab program.             Review of Systems  Constitutional:  Negative for chills and fever.  HENT:  Negative for hearing loss.   Eyes:  Negative for blurred vision and double vision.  Respiratory:  Negative for shortness of breath.   Cardiovascular:  Negative for chest pain.  Gastrointestinal:  Positive for diarrhea and nausea. Negative for abdominal pain, constipation and vomiting.  Genitourinary: Negative.   Musculoskeletal:  Negative for back pain, joint pain and neck pain.       L arm swelling  Skin:  Negative for rash.  Neurological:  Positive for sensory change and speech change (improved). Negative for dizziness and headaches.  Psychiatric/Behavioral:  The patient does not have insomnia.                  Past  Medical History:  Diagnosis Date   Anxiety     Cataract      OS   Diabetes mellitus without complication (HCC)     Diabetic retinopathy (HCC)      NPDR OU   Hyperlipidemia     Hypertension     Hypertensive retinopathy      OU   Tremors of nervous system      just when I get in a nervous Situation.             Past Surgical History:  Procedure Laterality Date   CATARACT EXTRACTION W/PHACO Right 10/18/2018    Procedure: CATARACT EXTRACTION PHACO  AND INTRAOCULAR LENS PLACEMENT (IOC);  Surgeon: Harrie Agent, MD;  Location: [facility];  Service: Ophthalmology;  Laterality: Right;  CDE: 8.11   CATARACT EXTRACTION W/PHACO Left 01/17/2019    Procedure: CATARACT EXTRACTION PHACO AND INTRAOCULAR LENS PLACEMENT (IOC);  Surgeon: Harrie Agent, MD;  Location: [facility];  Service: Ophthalmology;  Laterality: Left;  CDE: 6.38   CESAREAN SECTION        1988   COLONOSCOPY WITH PROPOFOL  N/A 05/04/2020    Procedure: COLONOSCOPY WITH PROPOFOL ;  Surgeon: Eartha Angelia Sieving, MD;  Location: [facility];  Service: Gastroenterology;  Laterality: N/A;  830   EYE SURGERY Right      Cataract extraction OD   POLYPECTOMY   05/04/2020    Procedure: POLYPECTOMY;  Surgeon: Eartha Angelia Sieving, MD;  Location: [facility];  Service: Gastroenterology;;             Family History  Problem Relation Age of Onset   Cancer Mother          breast and colon cancer   Breast cancer Mother     Diabetes Brother     Diabetes Son     Asthma Father          COPD   Diabetes Son          Social History:  reports that she has never smoked. She has never used smokeless tobacco. She reports that she does not drink alcohol  and does not use drugs. Allergies: [Allergies]  [Allergies]      Allergen Reactions   Ace Inhibitors Cough   Zetia  [Ezetimibe ] Swelling and Other (See Comments)      Sore throat         Medications Prior to Admission  Medication Sig Dispense Refill   amLODipine  (NORVASC ) 10 MG tablet Take 10 mg by mouth daily.       losartan  (COZAAR ) 25 MG tablet Take 25 mg by mouth daily.       metFORMIN  (GLUCOPHAGE ) 1000 MG tablet TAKE 1 TABLET BY MOUTH EVERY DAY WITH BREAKFAST (Patient taking differently: Take 1,000 mg by mouth 2 (two) times daily.) 90 tablet 1   rosuvastatin  (CRESTOR ) 5 MG tablet Take 5 mg by mouth every Monday, Wednesday, and Friday.                  Home: Home Living Family/patient expects to be discharged to:: Inpatient  rehab Living Arrangements: Spouse/significant other, Children Available Help at Discharge: Family, Available 24 hours/day Type of Home: House Home Access: Stairs to enter [employer] of Steps: 1 Entrance Stairs-Rails: None Home Layout: Two level, Able to live on main level with bedroom/bathroom Bathroom Shower/Tub: Engineer, Manufacturing Systems: Standard Home Equipment: Cane - single point, BSC/3in1  Lives With: Spouse   Functional History: Prior Function Prior Level of Function : Independent/Modified Independent,  Driving Mobility Comments: no AD PTA ADLs Comments: indep with ADLs, + driving, normally very active at baseline   Functional Status:  Mobility: Bed Mobility Overal bed mobility: Needs Assistance Bed Mobility: Rolling, Supine to Sit Rolling: Supervision, Used rails Supine to sit: Supervision, HOB elevated, Used rails Sit to supine: Supervision, HOB elevated, Used rails General bed mobility comments: increased time and cues Transfers Overall transfer level: Needs assistance Equipment used: Rolling walker (2 wheels) Transfers: Sit to/from Stand Sit to Stand: Contact guard assist Bed to/from chair/wheelchair/BSC transfer type:: Step pivot Step pivot transfers: Mod assist, Min assist General transfer comment: difficulty holding onto RW with RUE, cues for safety with walker use Ambulation/Gait Ambulation/Gait assistance: Contact guard assist Gait Distance (Feet): 170 Feet Assistive device: Rolling walker (2 wheels) Gait Pattern/deviations: Step-through pattern, Decreased stride length, Drifts right/left, Trunk flexed, Wide base of support, Decreased step length - right, Decreased step length - left, Narrow base of support General Gait Details: intermittent assist for RUE and RW management due to tendency for RUE to slip off Gait velocity: decreased Gait velocity interpretation: <1.31 ft/sec, indicative of household ambulator Pre-gait activities: Weight  shift, midline control, static march. Min assist with RW for support.   ADL: ADL Overall ADL's : Needs assistance/impaired Eating/Feeding: Set up, Sitting Eating/Feeding Details (indicate cue type and reason): able to manage cup Grooming: Wash/dry hands, Wash/dry face, Oral care, Minimal assistance, Sitting Grooming Details (indicate cue type and reason): used RUE as gross assist, declined performing in standing Upper Body Dressing : Minimal assistance, Sitting Upper Body Dressing Details (indicate cue type and reason): gown change Lower Body Dressing: Maximal assistance Lower Body Dressing Details (indicate cue type and reason): socks Toileting- Clothing Manipulation and Hygiene: Total assistance Toileting - Clothing Manipulation Details (indicate cue type and reason): pt incontinent of bowel on arrival, needs total A for peri hygiene and to prevent pt from placing hand into stool Functional mobility during ADLs: Minimal assistance, Moderate assistance, Rolling walker (2 wheels)   Cognition: Cognition Overall Cognitive Status: Impaired/Different from baseline Arousal/Alertness: Lethargic Orientation Level: Oriented X4 Year: Other (Comment) (8044) Month: December Day of Week: Incorrect (correct given choice of two) Attention: Focused, Sustained Focused Attention: Impaired Focused Attention Impairment: Verbal basic, Functional basic Sustained Attention: Impaired Sustained Attention Impairment: Verbal basic, Functional basic Memory: Impaired Memory Impairment: Storage deficit, Retrieval deficit, Decreased recall of new information, Decreased short term memory Decreased Short Term Memory: Verbal basic, Functional basic Awareness: Impaired Awareness Impairment: Intellectual impairment, Anticipatory impairment, Emergent impairment Problem Solving: Impaired Problem Solving Impairment: Verbal basic, Functional basic Executive Function: Organizing, Reasoning Reasoning:  Impaired Reasoning Impairment: Functional basic Organizing: Impaired Organizing Impairment: Functional basic Behaviors: Perseveration Safety/Judgment: Impaired Cognition Arousal: Alert Behavior During Therapy: WFL for tasks assessed/performed Overall Cognitive Status: Impaired/Different from baseline   Physical Exam: Blood pressure 99/68, pulse 95, temperature 98.1 F (36.7 C), temperature source Oral, resp. rate 15, height 5' 3 (1.6 m), weight 67.8 kg, SpO2 94%. Physical Exam   General: No apparent distress HEENT: Head is normocephalic, sclera anicteric, oral mucosa a little dry, + NG tube, some bruising around her eyes Neck: Supple without JVD or lymphadenopathy Heart: Reg rate and rhythm. No murmurs rubs or gallops Chest: CTA bilaterally without wheezes, rales, or rhonchi; no distress Abdomen: Soft, non-tender, non-distended, bowel sounds positive. Extremities: RUE edema Psych: Pt's affect is appropriate. Pt is cooperative Skin: Clean and intact without signs of breakdown Neuro:     Mental Status: AAOx4, can provide birthdate Speech/Languate: Naming and repetition  intact, fluent, follows simple commands, speech a little delayed CRANIAL NERVES: II: PERRL. Visual fields full III, IV, VI: EOM intact, no gaze preference or deviation V: normal sensation bilaterally VII: no asymmetry VIII: normal hearing to speech IX, X: normal palatal elevation XI: 5/5 head turn and 5/5 shoulder shrug bilaterally XII: Tongue midline     MOTOR: RUE: 4-/5 Deltoid, 4-/5 Biceps, 4-/5 Triceps, 4-/5 Grip LUE: 4+/5 Deltoid, 5/5 Biceps, 5/5 Triceps, 5/5 Grip RLE: HF 4/5, KE 5/5, ADF 4/5, APF 4/5 LLE: HF 4/5, KE 5/5, ADF 4/5, APF 4/5   No ankle clonus  No hypertonia noted    SENSORY: Altered in RUE to LT   Coordination: Normal finger to nose normal LUE, RUE appears intact given weakness     Lab Results Last 48 Hours        Results for orders placed or performed during the hospital  encounter of 07/25/24 (from the past 48 hours)  Glucose, capillary     Status: Abnormal    Collection Time: 07/31/24  4:38 PM  Result Value Ref Range    Glucose-Capillary 277 (H) 70 - 99 mg/dL      Comment: Glucose reference range applies only to samples taken after fasting for at least 8 hours.    Comment 1 Notify RN    Glucose, capillary     Status: Abnormal    Collection Time: 07/31/24  7:43 PM  Result Value Ref Range    Glucose-Capillary 143 (H) 70 - 99 mg/dL      Comment: Glucose reference range applies only to samples taken after fasting for at least 8 hours.    Comment 1 Notify RN      Comment 2 Document in Chart    Glucose, capillary     Status: Abnormal    Collection Time: 07/31/24 11:30 PM  Result Value Ref Range    Glucose-Capillary 132 (H) 70 - 99 mg/dL      Comment: Glucose reference range applies only to samples taken after fasting for at least 8 hours.    Comment 1 Notify RN      Comment 2 Document in Chart    Basic metabolic panel with GFR     Status: Abnormal    Collection Time: 08/01/24  2:00 AM  Result Value Ref Range    Sodium 138 135 - 145 mmol/L    Potassium 4.2 3.5 - 5.1 mmol/L    Chloride 102 98 - 111 mmol/L    CO2 27 22 - 32 mmol/L    Glucose, Bld 128 (H) 70 - 99 mg/dL      Comment: Glucose reference range applies only to samples taken after fasting for at least 8 hours.    BUN 14 8 - 23 mg/dL    Creatinine, Ser 9.23 0.44 - 1.00 mg/dL    Calcium  9.3 8.9 - 10.3 mg/dL    GFR, Estimated >39 >39 mL/min      Comment: (NOTE) Calculated using the CKD-EPI Creatinine Equation (2021)      Anion gap 9 5 - 15      Comment: Performed at [facility], [address], [place], [place] [address]  CBC     Status: Abnormal    Collection Time: 08/01/24  2:00 AM  Result Value Ref Range    WBC 10.7 (H) 4.0 - 10.5 K/uL    RBC 3.46 (L) 3.87 - 5.11 MIL/uL    Hemoglobin 11.1 (L) 12.0 - 15.0 g/dL    HCT  32.7 (L) 36.0 - 46.0 %    MCV 94.5 80.0 - 100.0 fL    MCH 32.1  26.0 - 34.0 pg    MCHC 33.9 30.0 - 36.0 g/dL    RDW 88.0 88.4 - 84.4 %    Platelets 276 150 - 400 K/uL    nRBC 0.7 (H) 0.0 - 0.2 %      Comment: Performed at [facility], [address], [place], [place] [address]  Magnesium      Status: None    Collection Time: 08/01/24  2:00 AM  Result Value Ref Range    Magnesium  2.1 1.7 - 2.4 mg/dL      Comment: Performed at [facility], [address] [place], [place] [address]  Phosphorus     Status: None    Collection Time: 08/01/24  2:00 AM  Result Value Ref Range    Phosphorus 3.5 2.5 - 4.6 mg/dL      Comment: Performed at [facility], [address], [place], [place] [address]  Glucose, capillary     Status: Abnormal    Collection Time: 08/01/24  4:28 AM  Result Value Ref Range    Glucose-Capillary 152 (H) 70 - 99 mg/dL      Comment: Glucose reference range applies only to samples taken after fasting for at least 8 hours.    Comment 1 Notify RN      Comment 2 Document in Chart    Glucose, capillary     Status: Abnormal    Collection Time: 08/01/24  8:21 AM  Result Value Ref Range    Glucose-Capillary 161 (H) 70 - 99 mg/dL      Comment: Glucose reference range applies only to samples taken after fasting for at least 8 hours.  Glucose, capillary     Status: Abnormal    Collection Time: 08/01/24 11:28 AM  Result Value Ref Range    Glucose-Capillary 135 (H) 70 - 99 mg/dL      Comment: Glucose reference range applies only to samples taken after fasting for at least 8 hours.  Glucose, capillary     Status: Abnormal    Collection Time: 08/01/24  3:23 PM  Result Value Ref Range    Glucose-Capillary 180 (H) 70 - 99 mg/dL      Comment: Glucose reference range applies only to samples taken after fasting for at least 8 hours.  Glucose, capillary     Status: Abnormal    Collection Time: 08/01/24  7:55 PM  Result Value Ref Range    Glucose-Capillary 189 (H) 70 - 99 mg/dL      Comment: Glucose reference range applies only to  samples taken after fasting for at least 8 hours.    Comment 1 Notify RN    Glucose, capillary     Status: Abnormal    Collection Time: 08/02/24 12:33 AM  Result Value Ref Range    Glucose-Capillary 153 (H) 70 - 99 mg/dL      Comment: Glucose reference range applies only to samples taken after fasting for at least 8 hours.    Comment 1 Notify RN    Glucose, capillary     Status: Abnormal    Collection Time: 08/02/24  4:19 AM  Result Value Ref Range    Glucose-Capillary 122 (H) 70 - 99 mg/dL      Comment: Glucose reference range applies only to samples taken after fasting for at least 8 hours.    Comment 1  Notify RN    Glucose, capillary     Status: Abnormal    Collection Time: 08/02/24  8:24 AM  Result Value Ref Range    Glucose-Capillary 155 (H) 70 - 99 mg/dL      Comment: Glucose reference range applies only to samples taken after fasting for at least 8 hours.  CK     Status: Abnormal    Collection Time: 08/02/24  8:57 AM  Result Value Ref Range    Total CK 321 (H) 38 - 234 U/L      Comment: Performed at [facility], [address] [place], [place] [address]  Glucose, capillary     Status: Abnormal    Collection Time: 08/02/24 11:43 AM  Result Value Ref Range    Glucose-Capillary 263 (H) 70 - 99 mg/dL      Comment: Glucose reference range applies only to samples taken after fasting for at least 8 hours.      Imaging Results (Last 48 hours)  No results found.         Blood pressure 99/68, pulse 95, temperature 98.1 F (36.7 C), temperature source Oral, resp. rate 15, height 5' 3 (1.6 m), weight 67.8 kg, SpO2 94%.   Medical Problem List and Plan: 1. 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 Zio patch at discharge.             -patient may shower             -ELOS/Goals: 10-12 days, PT sup, OT sup to min a, SLP sup             -Admit to CIR   2.  Antithrombotics: -DVT/anticoagulation:  Mechanical: Sequential  compression devices, below knee Bilateral lower extremities Pharmaceutical: Lovenox              -antiplatelet therapy: Aspirin     3. Pain Management: Tylenol  prn    4. Mood/Behavior/Sleep: LCSW to follow for evaluation and support when available.              -antipsychotic agents: n/a   5. Neuropsych/cognition: This patient is capable of making decisions on her own behalf.   6. Skin/Wound Care: Routine pressure relief measures.  Bruising noted to the right eye with ecchymosis.  CT head with R lateral periorbital hematoma.    7. Fluids/Electrolytes/Nutrition: Monitor I&O and weight. Follow up labs CBC/CMP               -SLP and RD consult for Cortrak mgt. Regular diet started 12/23-consider stopping tube feeds if intake improved.              GERD: On PPI    8. 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 Will need Zioptach at discharge.                          -Continue statin and aspirin .     9. Seizures: EEG 12/17 with epileptogenicity arising from the left central temporoparietal region. EEG 12/18 w/ moderate diffuse encephalopathy.              -Continue Keppra  500 mg BID.    10. Acute hypoxic respiratory failure: Cxr concerning for aspiration PNA. Blood cultures 12/15 w/ aerococcus viridans in 1 bottle, suspect contaminant, 12/16 Bcx no growth.              -completed 5 day course of  ceftriaxone               -encourage incentive spiro.    11. T2DM: A1c 6.4. CBG this AM 170.             -monitor cbg ac/hs with SSI novolog  and Lantus  12 units daily.    12. Normocytic anemia: Copious blood noted during ETT exchange and bronchoscopy w/o acute bleed.              -Hgb 9.6 continue to monitor and trend CBC.    13. HLD: LDL 145, Crestor  40 mg.   14.  Diarrhea: Recent laxative antibiotic use.              - Continue to monitor output--fiber supplement.   15. R hand swelling:             -Had xray that did not show  fracture, Check Vas US    16.HTN:  Long term goal 130/90             -Home meds norvasc /propranolol , monitor trend   Daphne LOISE Satterfield, NP 08/02/2024   I have personally performed a face to face diagnostic evaluation of this patient and formulated the key components of the plan.  Additionally, I have personally reviewed laboratory data, imaging studies, as well as relevant notes and concur with the physician assistant's documentation above.   The patient's status has not changed from the original H&P.  Any changes in documentation from the acute care chart have been noted above.   Murray Collier, MD "

## 2024-08-03 ENCOUNTER — Encounter (HOSPITAL_COMMUNITY): Payer: Self-pay | Admitting: Physical Medicine & Rehabilitation

## 2024-08-03 ENCOUNTER — Inpatient Hospital Stay (HOSPITAL_COMMUNITY)

## 2024-08-03 DIAGNOSIS — M792 Neuralgia and neuritis, unspecified: Secondary | ICD-10-CM

## 2024-08-03 DIAGNOSIS — M7989 Other specified soft tissue disorders: Secondary | ICD-10-CM | POA: Diagnosis not present

## 2024-08-03 DIAGNOSIS — E1165 Type 2 diabetes mellitus with hyperglycemia: Secondary | ICD-10-CM

## 2024-08-03 LAB — CBC WITH DIFFERENTIAL/PLATELET
Abs Immature Granulocytes: 0.13 K/uL — ABNORMAL HIGH (ref 0.00–0.07)
Basophils Absolute: 0.1 K/uL (ref 0.0–0.1)
Basophils Relative: 1 %
Eosinophils Absolute: 0.5 K/uL (ref 0.0–0.5)
Eosinophils Relative: 5 %
HCT: 33.8 % — ABNORMAL LOW (ref 36.0–46.0)
Hemoglobin: 11 g/dL — ABNORMAL LOW (ref 12.0–15.0)
Immature Granulocytes: 1 %
Lymphocytes Relative: 21 %
Lymphs Abs: 2 K/uL (ref 0.7–4.0)
MCH: 31.6 pg (ref 26.0–34.0)
MCHC: 32.5 g/dL (ref 30.0–36.0)
MCV: 97.1 fL (ref 80.0–100.0)
Monocytes Absolute: 1.4 K/uL — ABNORMAL HIGH (ref 0.1–1.0)
Monocytes Relative: 14 %
Neutro Abs: 5.5 K/uL (ref 1.7–7.7)
Neutrophils Relative %: 58 %
Platelets: 344 K/uL (ref 150–400)
RBC: 3.48 MIL/uL — ABNORMAL LOW (ref 3.87–5.11)
RDW: 12.1 % (ref 11.5–15.5)
WBC: 9.5 K/uL (ref 4.0–10.5)
nRBC: 0.2 % (ref 0.0–0.2)

## 2024-08-03 LAB — COMPREHENSIVE METABOLIC PANEL WITH GFR
ALT: 13 U/L (ref 0–44)
AST: 25 U/L (ref 15–41)
Albumin: 3.2 g/dL — ABNORMAL LOW (ref 3.5–5.0)
Alkaline Phosphatase: 140 U/L — ABNORMAL HIGH (ref 38–126)
Anion gap: 9 (ref 5–15)
BUN: 15 mg/dL (ref 8–23)
CO2: 27 mmol/L (ref 22–32)
Calcium: 9.6 mg/dL (ref 8.9–10.3)
Chloride: 102 mmol/L (ref 98–111)
Creatinine, Ser: 0.73 mg/dL (ref 0.44–1.00)
GFR, Estimated: >60 mL/min
Glucose, Bld: 173 mg/dL — ABNORMAL HIGH (ref 70–99)
Potassium: 4.5 mmol/L (ref 3.5–5.1)
Sodium: 138 mmol/L (ref 135–145)
Total Bilirubin: 0.4 mg/dL (ref 0.0–1.2)
Total Protein: 6.3 g/dL — ABNORMAL LOW (ref 6.5–8.1)

## 2024-08-03 LAB — GLUCOSE, CAPILLARY
Glucose-Capillary: 162 mg/dL — ABNORMAL HIGH (ref 70–99)
Glucose-Capillary: 167 mg/dL — ABNORMAL HIGH (ref 70–99)
Glucose-Capillary: 177 mg/dL — ABNORMAL HIGH (ref 70–99)
Glucose-Capillary: 223 mg/dL — ABNORMAL HIGH (ref 70–99)
Glucose-Capillary: 236 mg/dL — ABNORMAL HIGH (ref 70–99)
Glucose-Capillary: 262 mg/dL — ABNORMAL HIGH (ref 70–99)

## 2024-08-03 MED ORDER — GLUCERNA 1.5 CAL PO LIQD
1000.0000 mL | ORAL | Status: DC
  Administered 2024-08-03: 1000 mL
  Filled 2024-08-03: qty 1000

## 2024-08-03 MED ORDER — GERHARDT'S BUTT CREAM
TOPICAL_CREAM | Freq: Three times a day (TID) | CUTANEOUS | Status: DC
  Filled 2024-08-03: qty 60

## 2024-08-03 MED ORDER — GABAPENTIN 100 MG PO CAPS
100.0000 mg | ORAL_CAPSULE | Freq: Two times a day (BID) | ORAL | Status: DC
  Administered 2024-08-03: 100 mg via ORAL
  Filled 2024-08-03: qty 1

## 2024-08-03 NOTE — Progress Notes (Signed)
 "                                                        PROGRESS NOTE   Subjective/Complaints: Had a fair night. Right arm still tender, tingling/pins and needles in quality. R arm also still swollen.   ROS: Patient denies fever, rash, sore throat, blurred vision, dizziness, nausea, vomiting, diarrhea, cough, shortness of breath or chest pain,   headache, or mood change.    Objective:   No results found. Recent Labs    08/01/24 0200 08/03/24 0426  WBC 10.7* 9.5  HGB 11.1* 11.0*  HCT 32.7* 33.8*  PLT 276 344   Recent Labs    08/01/24 0200 08/03/24 0426  NA 138 138  K 4.2 4.5  CL 102 102  CO2 27 27  GLUCOSE 128* 173*  BUN 14 15  CREATININE 0.76 0.73  CALCIUM  9.3 9.6    Intake/Output Summary (Last 24 hours) at 08/03/2024 0842 Last data filed at 08/03/2024 0600 Gross per 24 hour  Intake --  Output 650 ml  Net -650 ml        Physical Exam: Vital Signs Blood pressure (!) 146/76, pulse (!) 102, temperature 98.9 F (37.2 C), resp. rate 18, height 5' 2 (1.575 m), weight 65.1 kg, SpO2 93%.  General: Alert and oriented x 3, No apparent distress. obese HEENT: Head is normocephalic, atraumatic, PERRLA, EOMI, sclera anicteric, oral mucosa pink and moist, dentition intact, ext ear canals clear, NGT Neck: Supple without JVD or lymphadenopathy Heart: Reg rate and rhythm. No murmurs rubs or gallops Chest: CTA bilaterally without wheezes, rales, or rhonchi; no distress Abdomen: Soft, non-tender, non-distended, bowel sounds positive. Extremities: No clubbing, cyanosis, RUE with 1 to 2+ edema.  Pulses are 2+ Psych: Pt's affect is appropriate. Pt is cooperative Skin: Clean and intact without signs of breakdown Neuro:  Alert and oriented x 3. Fair insight and awareness. fair Memory. Processing sl delayed. Normal language and speech. Cranial nerve exam unremarkable. MMT: RUE 4- /5 prox to distal. RLE 4/5. LUE and LLE 4+ to 5/5. Sensation decreased to LT in RUE.    Musculoskeletal: RLE tender to palpation, swollen as above    Assessment/Plan: 1. Functional deficits which require 3+ hours per day of interdisciplinary therapy in a comprehensive inpatient rehab setting. Physiatrist is providing close team supervision and 24 hour management of active medical problems listed below. Physiatrist and rehab team continue to assess barriers to discharge/monitor patient progress toward functional and medical goals  Care Tool:  Bathing              Bathing assist       Upper Body Dressing/Undressing Upper body dressing        Upper body assist      Lower Body Dressing/Undressing Lower body dressing            Lower body assist       Toileting Toileting    Toileting assist       Transfers Chair/bed transfer  Transfers assist           Locomotion Ambulation   Ambulation assist              Walk 10 feet activity   Assist           Walk 50 feet activity  Assist           Walk 150 feet activity   Assist           Walk 10 feet on uneven surface  activity   Assist           Wheelchair     Assist               Wheelchair 50 feet with 2 turns activity    Assist            Wheelchair 150 feet activity     Assist          Blood pressure (!) 146/76, pulse (!) 102, temperature 98.9 F (37.2 C), resp. rate 18, height 5' 2 (1.575 m), weight 65.1 kg, SpO2 93%.  Medical Problem List and Plan: 1. 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 Zio patch at discharge.             -patient may shower             -ELOS/Goals: 10-12 days, PT sup, OT sup to min a, SLP sup             -Patient is beginning CIR therapies today including PT, OT, and SLP    2.  Antithrombotics: -DVT/anticoagulation:  Mechanical: Sequential compression devices, below knee Bilateral lower extremities Pharmaceutical: Lovenox               -antiplatelet therapy: Aspirin     3. Pain Management: Tylenol  prn    -has neuropathic pain RUE--add low dose gabapentin  100mg  BID   -see #15 4. Mood/Behavior/Sleep: LCSW to follow for evaluation and support when available.              -antipsychotic agents: n/a   5. Neuropsych/cognition: This patient is capable of making decisions on her own behalf.   6. Skin/Wound Care: Routine pressure relief measures.  Bruising noted to the right eye with ecchymosis.  CT head with R lateral periorbital hematoma.    7. Fluids/Electrolytes/Nutrition: Monitor I&O and weight. Follow up labs CBC/CMP               -SLP and RD consult for Cortrak mgt. Regular diet started 12/23-consider stopping tube feeds if intake improved.    -ate 10% this morning   -labs reviewed, albumin low still. Continue with TF for now             GERD: On PPI    8. 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 Will need Zioptach at discharge.                          -Continue statin and aspirin .     9. Seizures: EEG 12/17 with epileptogenicity arising from the left central temporoparietal region. EEG 12/18 w/ moderate diffuse encephalopathy.              -Continue Keppra  500 mg BID.    10. Acute hypoxic respiratory failure: Cxr concerning for aspiration PNA. Blood cultures 12/15 w/ aerococcus viridans in 1 bottle, suspect contaminant, 12/16 Bcx no growth.              -completed 5 day course of ceftriaxone               -encourage IS, OOB.    11. T2DM: A1c 6.4. CBG this AM 170.             -  monitor cbg ac/hs with SSI novolog  and Lantus  12 units daily.    -borderline to poor control so far. No changes until consistent pattern 12. Normocytic anemia: Copious blood noted during ETT exchange and bronchoscopy w/o acute bleed.              -Hgb up to 11.0    13. HLD: LDL 145, Crestor  40 mg.   14.  Diarrhea: Recent laxative antibiotic use.              - last liq stool  was last night--monitor today   -receiving bannatrol thru tube   15. R hand swelling:             -Had xray that did not show fracture  -U/S pending  -discussed massage, elevation of RUE   16.HTN:  Long term goal 130/90             -Home meds norvasc /propranolol , monitor trend   -12/24 reasonable control  LOS: 1 days A FACE TO FACE EVALUATION WAS PERFORMED  Carolyn Hunter Gunther 08/03/2024, 8:42 AM     "

## 2024-08-03 NOTE — Plan of Care (Signed)
" °  Problem: RH Bed Mobility Goal: LTG Patient will perform bed mobility with assist (PT) Description: LTG: Patient will perform bed mobility with assistance, with/without cues (PT). Flowsheets (Taken 08/03/2024 1556) LTG: Pt will perform bed mobility with assistance level of: Independent   Problem: RH Bed to Chair Transfers Goal: LTG Patient will perform bed/chair transfers w/assist (PT) Description: LTG: Patient will perform bed to chair transfers with assistance (PT). Flowsheets (Taken 08/03/2024 1556) LTG: Pt will perform Bed to Chair Transfers with assistance level: Supervision/Verbal cueing   Problem: RH Car Transfers Goal: LTG Patient will perform car transfers with assist (PT) Description: LTG: Patient will perform car transfers with assistance (PT). Flowsheets (Taken 08/03/2024 1556) LTG: Pt will perform car transfers with assist:: Supervision/Verbal cueing   Problem: RH Furniture Transfers Goal: LTG Patient will perform furniture transfers w/assist (OT/PT) Description: LTG: Patient will perform furniture transfers  with assistance (OT/PT). Flowsheets (Taken 08/03/2024 1556) LTG: Pt will perform furniture transfers with assist:: Supervision/Verbal cueing   Problem: RH Floor Transfers Goal: LTG Patient will perform floor transfers w/assist (PT) Description: LTG: Patient will perform floor transfers with assistance (PT). Flowsheets (Taken 08/03/2024 1556) LTG: PT WILL PERFORM FLOOR TRANFERS  WITH  ASSIST:: Minimal Assistance - Patient > 75%   Problem: RH Ambulation Goal: LTG Patient will ambulate in controlled environment (PT) Description: LTG: Patient will ambulate in a controlled environment, # of feet with assistance (PT). Flowsheets (Taken 08/03/2024 1556) LTG: Pt will ambulate in controlled environ  assist needed:: Supervision/Verbal cueing LTG: Ambulation distance in controlled environment: 150 feet with LRAD Goal: LTG Patient will ambulate in home environment  (PT) Description: LTG: Patient will ambulate in home environment, # of feet with assistance (PT). Flowsheets (Taken 08/03/2024 1556) LTG: Pt will ambulate in home environ  assist needed:: Supervision/Verbal cueing LTG: Ambulation distance in home environment: 75 feet with LRAD   Problem: RH Stairs Goal: LTG Patient will ambulate up and down stairs w/assist (PT) Description: LTG: Patient will ambulate up and down # of stairs with assistance (PT) Flowsheets (Taken 08/03/2024 1556) LTG: Pt will ambulate up/down stairs assist needed:: Supervision/Verbal cueing LTG: Pt will  ambulate up and down number of stairs: curb step with LRAD for home entry   "

## 2024-08-03 NOTE — Progress Notes (Signed)
 Initial Nutrition Assessment  DOCUMENTATION CODES:   Not applicable  INTERVENTION:  Continue tube feeding via Cortrak: transition to nocturnal feeds to promote better PO intake during the day Glucerna 1.5 at 65 ml/h x 12 h/day (1800-0600) (780 ml per day) Provides 1170 kcal (meets 78% calorie needs), 64 gm protein (meets 85% protein needs), 592 ml free water  daily  Encourage PO intake throughout the day Continue Ensure Plus High Protein po BID, each supplement provides 350 kcal and 20 grams of protein. Added Magic cup TID with meals, each supplement provides 290 kcal and 9 grams of protein Will initiate calorie count 12/26-12/29 to assess ability to discontinue tube feeds  NUTRITION DIAGNOSIS:   Increased nutrient needs related to other (see comment) (participation in rehab) as evidenced by estimated needs.  GOAL:   Patient will meet greater than or equal to 90% of their needs  MONITOR:   PO intake, Supplement acceptance, TF tolerance  REASON FOR ASSESSMENT:   Consult Assessment of nutrition requirement/status, Enteral/tube feeding initiation and management  ASSESSMENT:   Pt with hx of HLD, HTN, and type 2 diabetes (w/ retinopathy). Recent admission for acute encephalopathy 2/2 to UTI and had been found on the ground post fall, diagnosed preorbital hematoma. Hospital course complicated by respiratory failure requiring intubation, aspiration pna (requiring Cortrak), and encephalopathy. Admitted to CIR for decreased functional mobility.  Brief Hospital Course: 12/15 - presented to ED, intubated  12/19 - cortrak placed, extubated 12/20 - SLP BSE, NPO 12/21 - SLP BSE, DYS3, thin  CIR Admission: 12/23 admitted, advanced to regular diet  RD working remotely at time of assessment. Unable to reach patient, may be participating in therapy at time of call. Information obtained from chart review.  Pt's Cortrak remains in place with tube feeds at goal rate. Pt's PO intake poor,  only 1 recorded meal since admission of 10% intake. Will transition to nocturnal feeds to promote increased appetite and PO intake during the day. Banatrol for loose stools, will continue to monitor. Pt has been refusing Ensure supplements, will add magic cups to trays and assess acceptance of supplements at follow up.   Pt previously followed inpatient by RD team and was noted to be well nourished at baseline. Weight has been stable since admission, suggesting tube feeds at previous rate were meeting patient's needs. Will aim for nocturnal feeds to meet ~75% of pt's calorie and protein needs. Continue to encourage PO intake to help meet complete nutrition needs.   Pt would benefit from calorie count at the end of week to assess PO intake. Will plan to initiate Friday 12/26 and assess on Monday 12/29.  Average Meal Completion: 12/24: 10% average intake x 1 recorded meal  Nutritionally Relevant Medications: Vitamin C  1000 mg daily Ensure Plus BID Banatrol TF BID SSI 0-20 units q4h Lantus  12 units daily Keppra  Zinc  sulfate daily  Labs reviewed: CBG x 24 h: 162-236 mg/dL J8r 6.4  NUTRITION - FOCUSED PHYSICAL EXAM:  Deferred to follow up  Diet Order:   Diet Order             Diet regular Room service appropriate? Yes with Assist; Fluid consistency: Thin  Diet effective now                   EDUCATION NEEDS:   No education needs have been identified at this time  Skin:  Skin Assessment: Reviewed RN Assessment  Last BM:  12/23 type 7 x 2  Height:   Ht  Readings from Last 1 Encounters:  08/02/24 5' 2 (1.575 m)    Weight:   Wt Readings from Last 1 Encounters:  08/03/24 65.1 kg    Ideal Body Weight:  52.3 kg  BMI:  Body mass index is 26.25 kg/m.  Estimated Nutritional Needs:   Kcal:  1500-1700  Protein:  75-95g  Fluid:  > 1.5 L    Josette Glance, MS, RDN, LDN Clinical Dietitian I Please reach out via secure chat

## 2024-08-03 NOTE — Progress Notes (Signed)
 Inpatient Rehabilitation  Patient information reviewed and entered into eRehab system by [facility]. Karen Kays., CCC/SLP, PPS Coordinator.  Information including medical coding, functional ability and quality indicators will be reviewed and updated through discharge.

## 2024-08-03 NOTE — Progress Notes (Signed)
 Inpatient Rehabilitation Admission Medication Review by a Pharmacist  A complete drug regimen review was completed for this patient to identify any potential clinically significant medication issues.  High Risk Drug Classes Is patient taking? Indication by Medication  Antipsychotic Yes Compazine -N/V  Anticoagulant Yes Lovenox -VTE px  Antibiotic No   Opioid No   Antiplatelet Yes Aspirin -Stroke  Hypoglycemics/insulin  Yes Novolog  and Lantus -T2DM  Vasoactive Medication Yes Norvasc -HTN  Chemotherapy No   Other Yes Acetaminophen -fever, mild pain Maalox-Indigestion Vit C, zinc -wound care Diphenhydramine -itching Bisacodyl , docusate, Miralax , fleets-constipation Keppra -seizures Crestor -HLD Guaifenesin /DM-cough     Type of Medication Issue Identified Description of Issue Recommendation(s)  Drug Interaction(s) (clinically significant)     Duplicate Therapy     Allergy     No Medication Administration End Date     Incorrect Dose     Additional Drug Therapy Needed     Significant med changes from prior encounter (inform family/care partners about these prior to discharge). Patient on Losartan  and Metformin  PTA Educated Family on Medication changes prior to discharge from CIR  Other       Clinically significant medication issues were identified that warrant physician communication and completion of prescribed/recommended actions by midnight of the next day:  No  Name of provider notified for urgent issues identified:   Provider Method of Notification:     Pharmacist comments:   Time spent performing this drug regimen review (minutes):  20   Alec Mcphee A. Lyle, PharmD, BCPS, FNKF Clinical Pharmacist [place] Please utilize Amion for appropriate phone number to reach the unit pharmacist [facility])  08/03/2024 7:14 AM

## 2024-08-03 NOTE — Evaluation (Signed)
 Speech Language Pathology Assessment and Plan  Patient Details  Name: Carolyn Hunter MRN: [id number] Date of Birth: 05-31-1951  SLP Diagnosis: Cognitive Impairments  Rehab Potential: Good ELOS: 10-12 days    Today's Date: 08/03/2024 SLP Individual Time: 8949-8852 SLP Individual Time Calculation (min): 57 min   Hospital Problem: Principal Problem:   CVA (cerebral vascular accident) [facility])  Past Medical History:  Past Medical History:  Diagnosis Date   Anxiety    Cataract    OS   Diabetes mellitus without complication (HCC)    Diabetic retinopathy (HCC)    NPDR OU   Hyperlipidemia    Hypertension    Hypertensive retinopathy    OU   Tremors of nervous system    just when I get in a nervous Situation.   Past Surgical History:  Past Surgical History:  Procedure Laterality Date   CATARACT EXTRACTION W/PHACO Right 10/18/2018   Procedure: CATARACT EXTRACTION PHACO AND INTRAOCULAR LENS PLACEMENT (IOC);  Surgeon: Harrie Agent, MD;  Location: [facility];  Service: Ophthalmology;  Laterality: Right;  CDE: 8.11   CATARACT EXTRACTION W/PHACO Left 01/17/2019   Procedure: CATARACT EXTRACTION PHACO AND INTRAOCULAR LENS PLACEMENT (IOC);  Surgeon: Harrie Agent, MD;  Location: [facility];  Service: Ophthalmology;  Laterality: Left;  CDE: 6.38   CESAREAN SECTION     1988   COLONOSCOPY WITH PROPOFOL  N/A 05/04/2020   Procedure: COLONOSCOPY WITH PROPOFOL ;  Surgeon: Eartha Angelia Sieving, MD;  Location: [facility];  Service: Gastroenterology;  Laterality: N/A;  830   EYE SURGERY Right    Cataract extraction OD   POLYPECTOMY  05/04/2020   Procedure: POLYPECTOMY;  Surgeon: Eartha Angelia Sieving, MD;  [facility]: [facility];  Service: Gastroenterology;;    Assessment / Plan / Recommendation Clinical Impression HPI: Pt is a 73 y/o female with PMH of HTN, HLD, and DM, who presented to [facility] on 12/15 after a fall at home with right sided weakness and AMS.  Per EMS report, pt fell  sometime the previous day but was not found until 12/15 (note she lives with her spouse who has dementia).  On arrival to ED her GCS was 8 and she was not protecting her airway so was intubated.  NIHSS was 25 in ED, WBC 13.5, CK 4216.  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  Therapy has been ongoing and pt was recommended for CIR   Clinical Impression:  Bedside Swallow Evaluation: A bedside swallow evaluation was completed to assess for s/sx of oropharyngeal dysphagia. Oral mechanism exam functional. POs administered included thin liquids, purees and solids. Patient with timely mastication and complete oral clearance. No s/sx of aspiration present. Recommend regular/thin diet with use of standardized precautions including sitting upright during PO and taking small bites/sips at a slow rate. Recommend supervision during mealtimes due to physical need. Cognitive-Linguistic: Patient was evaluated via the Cognistat to assess cognitive linguistic functioning. Patient with strengths in orientation and expressive/receptive language. Patient initially reports no cognitive changes, however verbalized signficant changes as the evaluation progressed. Patient with moderate deficits in immediate recall, calculations and similarities. Mild deficits in delayed recall and repetition (likely 2/2 storage deficits rather than language). Patient would benefit from skilled ST services targeting memory, problem solving and awareness.  Pt would benefit from skilled  ST services to maximize cognition in order to maximize functional independence at d/c. Anticipate patient will require supervision at d/c and f/u SLP services.    Skilled Therapeutic Interventions           Patient evaluated using a standardized cognitive linguistic assessment and bedside swallow evaluation to assess current cognitive, communicative and swallowing function. See above for details.    SLP Assessment  Patient will need skilled Speech Lanaguage Pathology Services during CIR admission    Recommendations  SLP Diet Recommendations: Age appropriate regular solids;Thin Liquid Administration via: Cup;Straw Medication Administration: Whole meds with liquid Supervision: Full supervision/cueing for compensatory strategies (physical deficits) Compensations: Slow rate;Small sips/bites Postural Changes and/or Swallow Maneuvers: Seated upright 90 degrees Oral Care Recommendations: Oral care BID Patient destination: Home Follow up Recommendations: Home Health SLP;Outpatient SLP Equipment Recommended: None recommended by SLP    SLP Frequency 3 to 5 out of 7 days   SLP Duration  SLP Intensity  SLP Treatment/Interventions 10-12 days  Minumum of 1-2 x/day, 30 to 90 minutes  Cognitive remediation/compensation;Functional tasks;Internal/external aids;Patient/family education;Therapeutic Exercise    Pain Denies  SLP Evaluation Cognition Overall Cognitive Status: Impaired/Different from baseline Arousal/Alertness: Lethargic Orientation Level: Oriented X4 Year: 2025 Month: December Day of Week: Correct Attention: Focused;Sustained Focused Attention: Appears intact Sustained Attention: Appears intact Memory: Impaired Memory Impairment: Storage deficit;Retrieval deficit;Decreased recall of new information;Decreased short term memory Decreased Short Term Memory: Verbal basic;Functional basic Awareness: Impaired Awareness Impairment: Intellectual impairment Problem Solving: Impaired Problem Solving Impairment: Verbal complex Behaviors: Perseveration Safety/Judgment: Appears intact  Comprehension Auditory Comprehension Overall Auditory Comprehension: Appears within functional limits  for tasks assessed Yes/No Questions: Within Functional Limits Commands: Within Functional Limits Expression Expression Primary Mode of Expression: Verbal Verbal Expression Overall Verbal Expression: Appears within functional limits for tasks assessed Repetition: Impaired (secondary to memory deficits) Written Expression Dominant Hand: Right Oral Motor Oral Motor/Sensory Function Overall Oral Motor/Sensory Function: Mild impairment Facial ROM: Within Functional Limits Facial Symmetry: Within Functional Limits Facial Strength: Within Functional Limits Facial Sensation: Within Functional Limits Lingual ROM: Within Functional Limits Lingual Symmetry: Within Functional Limits Lingual Strength: Within Functional Limits Velum: Within Functional Limits Mandible: Within Functional Limits Motor Speech Overall Motor Speech: Appears within functional limits for tasks assessed Respiration: Within functional limits Phonation: Normal Resonance: Within functional limits Articulation: Within functional limitis Intelligibility: Intelligible Motor Planning: Within functional limits  Care Tool Care Tool Cognition Ability to hear (with hearing aid or hearing appliances if normally used Ability to hear (with hearing aid or hearing appliances if normally used): 0. Adequate - no difficulty in normal conservation, social interaction, listening to TV   Expression of Ideas and Wants Expression of Ideas and Wants: 4. Without difficulty (complex and basic) - expresses complex messages without difficulty and with speech that is clear and easy to understand   Understanding Verbal and Non-Verbal Content Understanding Verbal and Non-Verbal Content: 4. Understands (complex and basic) - clear comprehension without cues or repetitions  Memory/Recall Ability Memory/Recall Ability : Current season;That he or she is in a hospital/hospital unit    Bedside Swallowing Assessment General Previous Swallow  Assessment: 07/30/24 Diet Prior to this Study: Regular;Thin liquids (Level 0) Respiratory Status: Room air History of Recent Intubation: Yes Total duration of intubation (days): 5 days Behavior/Cognition: Cooperative;Alert;Pleasant mood Oral Cavity - Dentition: Missing dentition Self-Feeding Abilities: Total assist Patient Positioning: Upright in bed Baseline Vocal Quality: Normal Volitional Cough: Strong Volitional Swallow: Able to elicit  Ice Chips Ice chips: Not tested Thin Liquid Thin  Liquid: Within functional limits Presentation: Spoon;Cup;Self Fed;Straw Nectar Thick Nectar Thick Liquid: Not tested Honey Thick Honey Thick Liquid: Not tested Puree Puree: Within functional limits Presentation: Spoon Solid Solid: Within functional limits Presentation: Self Fed BSE Assessment Risk for Aspiration Impact on safety and function: Mild aspiration risk Other Related Risk Factors: Prolonged intubation;Cognitive impairment;Deconditioning  Short Term Goals: Week 1: SLP Short Term Goal 1 (Week 1): Patient will recall cognitive and physical deficits with min verbal A SLP Short Term Goal 2 (Week 1): Patient will recall daily activities with min multimodal A SLP Short Term Goal 3 (Week 1): Patient will demonstrate problem solving skills in functional situations given min multimodal A  Refer to Care Plan for Long Term Goals  Recommendations for other services: None   Discharge Criteria: Patient will be discharged from SLP if patient refuses treatment 3 consecutive times without medical reason, if treatment goals not met, if there is a change in medical status, if patient makes no progress towards goals or if patient is discharged from hospital.  The above assessment, treatment plan, treatment alternatives and goals were discussed and mutually agreed upon: by patient  Luwanda Starr M.A., CCC-SLP 08/03/2024, 11:36 AM

## 2024-08-03 NOTE — Progress Notes (Signed)
 " Inpatient Rehabilitation Care Coordinator Assessment and Plan Patient Details  Name: Carolyn Hunter MRN: [id number] Date of Birth: [birth year]  Today's Date: 08/03/2024  Hospital Problems: Principal Problem:   CVA (cerebral vascular accident) [facility])  Past Medical History:  Past Medical History:  Diagnosis Date   Anxiety    Cataract    OS   Diabetes mellitus without complication (HCC)    Diabetic retinopathy (HCC)    NPDR OU   Hyperlipidemia    Hypertension    Hypertensive retinopathy    OU   Tremors of nervous system    just when I get in a nervous Situation.   Past Surgical History:  Past Surgical History:  Procedure Laterality Date   CATARACT EXTRACTION W/PHACO Right 10/18/2018   Procedure: CATARACT EXTRACTION PHACO AND INTRAOCULAR LENS PLACEMENT (IOC);  Surgeon: Harrie Agent, MD;  Location: [facility];  Service: Ophthalmology;  Laterality: Right;  CDE: 8.11   CATARACT EXTRACTION W/PHACO Left 01/17/2019   Procedure: CATARACT EXTRACTION PHACO AND INTRAOCULAR LENS PLACEMENT (IOC);  Surgeon: Harrie Agent, MD;  Location: [facility];  Service: Ophthalmology;  Laterality: Left;  CDE: 6.38   CESAREAN SECTION     1988   COLONOSCOPY WITH PROPOFOL  N/A 05/04/2020   Procedure: COLONOSCOPY WITH PROPOFOL ;  Surgeon: Eartha Angelia Sieving, MD;  [facility];  Service: Gastroenterology;  Laterality: N/A;  830   EYE SURGERY Right    Cataract extraction OD   POLYPECTOMY  05/04/2020   Procedure: POLYPECTOMY;  Surgeon: Eartha Angelia Sieving, MD;  [facility];  Service: Gastroenterology;;   Social History:  reports that she has never smoked. She has never used smokeless tobacco. She reports that she does not drink alcohol  and does not use drugs.  Family / Support Systems Marital Status: Married Patient Roles: Spouse, Parent, Engineer, Structural, Other (Comment) (retiree) Spouse/Significant Other: Jenene [phone] Children: Kelly-daughter [phone] Ross-son [phone] lives in  [place] Other Supports: Grandchildren Anticipated Caregiver: [name] Ability/Limitations of Caregiver: [name] plans to move in with parents for a short time and take a LOA from work if needed Caregiver Availability: 24/7 Family Dynamics: Close knit with children and friends. Pt has been the main caregiver of husband and maybe didn't take good care of herself but plans to now  Social History Preferred language: English Religion: Non-Denominational Cultural Background: NA Education: HS Health Literacy - How often do you need to have someone help you when you read instructions, pamphlets, or other written material from your doctor or pharmacy?: Never Writes: Yes Employment Status: Retired Marine Scientist Issues: NA Guardian/Conservator: None-according to MD pt is capable of making her own decisions while here. Husband is present today and daughter will be visiting daily   Abuse/Neglect Abuse/Neglect Assessment Can Be Completed: Yes Physical Abuse: Denies Verbal Abuse: Denies Sexual Abuse: Denies Exploitation of patient/patient's resources: Denies Self-Neglect: Denies  Patient response to: Social Isolation - How often do you feel lonely or isolated from those around you?: Never  Emotional Status Pt's affect, behavior and adjustment status: Pt is motivated to recover and regain her independence she has always been the one to take care of others and not one to sit still. She is aware she will need to take care of herself. Recent Psychosocial Issues: other health issues Psychiatric History: Hx-anxiety takes medications and finds them helpful. She may benefit from seein neuro-psych while here. Will get input from team and move forward Substance Abuse History: NA  Patient / Family Perceptions, Expectations & Goals Pt/Family understanding of  illness & functional limitations: Pt and husband can explain her stroke and deficits, she is having pain inher hand and arm. Both have spoken with  the MD's involved and feel understand her plan moving forward. Premorbid pt/family roles/activities: wife, mom, caregiver, retiree, friend, grandmother, etc Anticipated changes in roles/activities/participation: resume Pt/family expectations/goals: Pt states:  I hope to be able to do for myself when I leave here.  Husband states:  We will help her but know she is very independent.  Community [employer] Agencies: None Premorbid Home Care/DME Agencies: Other (Comment) (has cane and bsc) Transportation available at discharge: pt and husband drives still Is the patient able to respond to transportation needs?: Yes In the past 12 months, has lack of transportation kept you from medical appointments or from getting medications?: No In the past 12 months, has lack of transportation kept you from meetings, work, or from getting things needed for daily living?: No Resource referrals recommended: Neuropsychology  Discharge Planning Living Arrangements: Spouse/significant other Support Systems: Spouse/significant other, Children, Other relatives, Friends/neighbors Type of Residence: Private residence Insurance Resources: [employer] (specify) ([employer] Advantage) Financial Resources: Social Security Financial Screen Referred: No Living Expenses: Own Money Management: Patient, Spouse Does the patient have any problems obtaining your medications?: No Home Management: self Patient/Family Preliminary Plans: Retrun home with husband and daughter suppose to move in for a short time and take time off of work to be able to provide care to pt. Husband is present and moves well and still drives he reports at times he does not understand what is said to him. Aware being evaluated today and goals set for stay here Care Coordinator Barriers to Discharge: Insurance for SNF coverage Care Coordinator Anticipated Follow Up Needs: HH/OP  Clinical Impression Pleasant female who is one who takes  cares of others before taking care of herself. Husband has health issues but is mobile and still drives. Their daughter-Kelly to move in with for a short time and assist them. Awaiting return call from her to confirm this. Being evaluated today and goals set for stay here. Will work on discharge needs.  Raymonde Asberry MATSU 08/03/2024, 9:43 AM    "

## 2024-08-03 NOTE — Plan of Care (Signed)
" °  Problem: RH Problem Solving Goal: LTG Patient will demonstrate problem solving for (SLP) Description: LTG:  Patient will demonstrate problem solving for basic/complex daily situations with cues  (SLP) Flowsheets (Taken 08/03/2024 1150) LTG: Patient will demonstrate problem solving for (SLP): Basic daily situations LTG Patient will demonstrate problem solving for: Minimal Assistance - Patient > 75%   Problem: RH Memory Goal: LTG Patient will demonstrate ability for day to day (SLP) Description: LTG:   Patient will demonstrate ability for day to day recall/carryover during cognitive/linguistic activities with assist  (SLP) Flowsheets (Taken 08/03/2024 1150) LTG: Patient will demonstrate ability for day to day recall: New information LTG: Patient will demonstrate ability for day to day recall/carryover during cognitive/linguistic activities with assist (SLP): Minimal Assistance - Patient > 75%   Problem: RH Awareness Goal: LTG: Patient will demonstrate awareness during functional activites type of (SLP) Description: LTG: Patient will demonstrate awareness during functional activites type of (SLP) Flowsheets (Taken 08/03/2024 1150) Patient will demonstrate during cognitive/linguistic activities awareness type of: Intellectual LTG: Patient will demonstrate awareness during cognitive/linguistic activities with assistance of (SLP): Minimal Assistance - Patient > 75%   "

## 2024-08-03 NOTE — Patient Care Conference (Signed)
 Inpatient RehabilitationTeam Conference and Plan of Care Update Date: 08/03/2024   Time: 10:16 AM    Patient Name: Carolyn Hunter      Medical Record Number: [id number]  Date of Birth: 12/07/1950 Sex: Female         Room/Bed: 4M06C/4M06C-01 Payor Info: Payor: [employer] / Plan: [employer] PPO / Product Type: *No Product type* /    Admit Date/Time:  08/02/2024  6:14 PM  Primary Diagnosis:  CVA (cerebral vascular accident) [facility])  Hospital Problems: Principal Problem:   CVA (cerebral vascular accident) [facility])    Expected Discharge Date: Expected Discharge Date:  (Evals pending)  Team Members Present: Physician leading conference: Dr. Murray Collier Social Worker Present: Rhoda Clement, LCSW Nurse Present: Barnie Ronde, RN PT Present: Recardo Milliner, PT;Dominic Freddi, PTA OT Present: Recardo Maxwell, OT     Current Status/Progress Goal Weekly Team Focus  Bowel/Bladder   l  Foley removed 08/03/24; Loose bowel from TF and laxatives;  Regain continence   Time toileting and bladder scanning  Fiber supplement on board Adjust TF to HS and increase po intake  Swallow/Nutrition/ Hydration   eval pending           ADL's   min UB self care,   mod LB self care,  min A HHA ambulation , supervision STS   supervision overall   ADL training, RUE NMR and edema management, pt education    Mobility               Communication   eval pending            Safety/Cognition/ Behavioral Observations  eval pending            Pain   No c/o pain   Remain pain free   Assess qshift and prn    Skin   Skin intact  MASD to buttocks maintain skin integrity  Assess qshift and prn  Gerhardt's cream to buttocks, toileting protocol, adjust TF    Discharge Planning:  new evaluation according to acute chart going home with daughter-kelly moving in with due to husband has dementia and needs care. Await therapy evaluations   Team Discussion: Patient admitted post  left fronto-parietal CVA with encephalopathy, right side weakness and tingling with edema of right hand which limits functional movement, aspiration pna with dysphagia. Maintained on TF via cortrak at present with po diet order; poor appetite and loose bowels.    Patient on target to meet rehab goals: Evals pending; needs min assist HHA for transfers and completes sit - stand with supervision. Has full ROM right UE and no sensory loss.  *See Care Plan and progress notes for long and short-term goals.   Revisions to Treatment Plan:  Xray of hand; no fracture, coban dressing applied US  right UE   Teaching Needs: Safety, medications, dietary modification, tranfers, toileting, etc.   Current Barriers to Discharge: Decreased caregiver support and Home enviroment access/layout  Possible Resolutions to Barriers: Family education     Medical Summary Current Status: eft FP stroke. dysphagia, poor po intake, RUE pain and swelling  Barriers to Discharge: Medical stability   Possible Resolutions to [employer] Focus: dopplers, gabapentin  trial, continue TF for now   Continued Need for Acute Rehabilitation Level of Care: The patient requires daily medical management by a physician with specialized training in physical medicine and rehabilitation for the following reasons: Direction of a multidisciplinary physical rehabilitation program to maximize functional independence : Yes Medical management of patient stability  for increased activity during participation in an intensive rehabilitation regime.: Yes Analysis of laboratory values and/or radiology reports with any subsequent need for medication adjustment and/or medical intervention. : Yes   I attest that I was present, lead the team conference, and concur with the assessment and plan of the team.   Fredericka Sober B 08/03/2024, 12:44 PM

## 2024-08-03 NOTE — Evaluation (Signed)
 Occupational Therapy Assessment and Plan  Patient Details  Name: Carolyn Hunter MRN: [id number] Date of Birth: [birth year]  OT Diagnosis: acute pain, hemiplegia affecting dominant side, and pain in joint Rehab Potential: Rehab Potential (ACUTE ONLY): Good ELOS: 10-12 days   Today's Date: 08/03/2024 OT Individual Time: 9174-9069 OT Individual Time Calculation (min): 65 min     Pt seen for initial evaluation and ADL training with a focus on functional mobility and balance as she engaged in eating EOB,  ambulating to bathroom HHA min A,  toileting, shower, dressing EOB.  Pt did an excellent job following directions, demonstrating safety awareness, and putting in good effort.   Pt has edema in R hand that limits her ability to engage her hand in B tasks.  At end of session, wrapped fingers and hand in coban for edema in control. Informed both RN and pt to try to keep it on until dinner time, unless it becomes uncomfortable and to keep hand elevated as much as possible.   Reviewed role of OT, discussed POC and pt's goals, and ELOS.  Pt resting in bed With all needs met and bed alarm set.     Hospital Problem: Principal Problem:   CVA (cerebral vascular accident) [facility])   Past Medical History:  Past Medical History:  Diagnosis Date   Anxiety    Cataract    OS   Diabetes mellitus without complication (HCC)    Diabetic retinopathy (HCC)    NPDR OU   Hyperlipidemia    Hypertension    Hypertensive retinopathy    OU   Tremors of nervous system    just when I get in a nervous Situation.   Past Surgical History:  Past Surgical History:  Procedure Laterality Date   CATARACT EXTRACTION W/PHACO Right 10/18/2018   Procedure: CATARACT EXTRACTION PHACO AND INTRAOCULAR LENS PLACEMENT (IOC);  Surgeon: Harrie Agent, MD;  Location: [facility];  Service: Ophthalmology;  Laterality: Right;  CDE: 8.11   CATARACT EXTRACTION W/PHACO Left 01/17/2019   Procedure: CATARACT EXTRACTION PHACO AND INTRAOCULAR LENS  PLACEMENT (IOC);  Surgeon: Harrie Agent, MD;  Location: [facility];  Service: Ophthalmology;  Laterality: Left;  CDE: 6.38   CESAREAN SECTION     1988   COLONOSCOPY WITH PROPOFOL  N/A 05/04/2020   Procedure: COLONOSCOPY WITH PROPOFOL ;  Surgeon: Eartha Angelia Sieving, MD;  Location: [facility];  Service: Gastroenterology;  Laterality: N/A;  830   EYE SURGERY Right    Cataract extraction OD   POLYPECTOMY  05/04/2020   Procedure: POLYPECTOMY;  Surgeon: Eartha Angelia Sieving, MD;  Location: [facility];  Service: Gastroenterology;;    Assessment & Plan Clinical Impression:  .[name] is a 73 year old female with PMHx hypertension, hyperlipidemia, type 2 diabetes who presented to [facility] on 12/15 with acute encephalopathy.  Per EMS report the patient was found down on the ground after a fall and unresponsive.  She was not found until 12/15 (patient lives with her spouse who has dementia).  Upon arrival to ER patient exhibited altered mental status with GCS of 8, O2 sats 90% on room air. She was placed on nonrebreather but unable to protect airway for requiring intubation.  Initial NIHSS 25.  Labs: Glucose 400s, WBC 13.5, CK 4216, lactic acid 4.7 UA positive for UTI.  Bruising noted to the right periorbital area and swelling of right hand and forearm.  No other obvious injuries or overt infection.   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  Code activation delayed due to unknown LKW but patient exhibited inability to move right upper extremity.  Neurology was consulted who activated code stroke.  Follow-up CTA without LVO.  Neurology recommended EEG and MRI. EEG showed epileptic discharges and the patient was loaded with Keppra  without recurrent seizures. 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 2D echocardiogram showed LVEF 60 to 65%, atria normal in size.  LDL 145 and  hemoglobin A1c 6.4.  Aspirin  81 mg daily and Lovenox  for DVT prophylaxis.   Hospital course complicated by encephalopathy, acute hypoxic respiratory failure, chest x-ray revealed right basilar opacity and concerns for aspiration pneumonia.  Blood cultures with streptococcal growth, repeat blood cultures done 12/16 showed no growth suspected contamination. She was placed on 5 day course of IV ceftriaxone .  Extubated on 07/29/2024, she was n.p.o., Cortrak placed.  Speech therapy followed and conducted slums test that revealed score of 7/26, severe cognitive impairment.  Swallowing improved and the patient was started on D3 thin liquid diet on 12/21.  Pt has had diarrhea, thought to be due to laxative/antibiotic/tube feed related, started on fiber.    Prior to arrival the patient was independent, retired and driving. She lives in a two-level home with the ability to live on the main level with her spouse.  OT evaluated and patient demonstrated total assist lower body ADLs min to mod assist for upper body ADLs and min assist with transfer. Therapy evaluations completed due to patient decreased functional mobility was admitted for a comprehensive rehab program.  Patient transferred to CIR on 08/02/2024 .    Patient currently requires mod with basic self-care skills secondary to hand edema, decreased coordination, and decreased sitting balance, decreased standing balance, hemiplegia, and decreased balance strategies.  Prior to hospitalization, patient was fully independent.   Patient will benefit from skilled intervention to increase independence with basic self-care skills prior to discharge home with care partner.  Anticipate patient will require intermittent supervision and follow up home health.  OT - End of Session Activity Tolerance: Tolerates 30+ min activity with multiple rests Endurance Deficit: Yes OT Assessment Rehab Potential (ACUTE ONLY): Good OT Barriers to Discharge: None OT Patient  demonstrates impairments in the following area(s): Balance;Motor;Endurance;Edema;Nutrition;Pain (poor appetite) OT Basic ADL's Functional Problem(s): Dressing;Toileting;Bathing;Grooming;Eating OT Advanced ADL's Functional Problem(s): Light Housekeeping;Simple Meal Preparation OT Transfers Functional Problem(s): Toilet;Tub/Shower OT Additional Impairment(s): Fuctional Use of Upper Extremity OT Plan OT Intensity: Minimum of 1-2 x/day, 45 to 90 minutes OT Frequency: 5 out of 7 days OT Duration/Estimated Length of Stay: 10-12 days OT Treatment/Interventions: Balance/vestibular training;Discharge planning;DME/adaptive equipment instruction;Functional mobility training;Neuromuscular re-education;Patient/family education;Psychosocial support;Pain management;Self Care/advanced ADL retraining;Therapeutic Activities;Therapeutic Exercise;UE/LE Strength taining/ROM;UE/LE Coordination activities OT Self Feeding Anticipated Outcome(s): independent OT Basic Self-Care Anticipated Outcome(s): supervision OT Toileting Anticipated Outcome(s): supervision OT Bathroom Transfers Anticipated Outcome(s): supervision OT Recommendation Recommendations for Other Services: None Patient destination: Home Follow Up Recommendations: Home health OT Equipment Recommended: To be determined   OT Evaluation Precautions/Restrictions  Precautions Precautions: Fall Recall of Precautions/Restrictions: Intact Restrictions Weight Bearing Restrictions Per Provider Order: No Pain Pain Assessment Pain Score: 5  Pain Type: Acute pain Pain Location: Hand Pain Orientation: Right Pain Descriptors / Indicators: Aching Pain Onset: Gradual Pain Intervention(s): Hot/Cold interventions;Other (Comment) (coban wrap) Home Living/Prior Functioning Home Living Family/patient expects to be discharged to:: Private residence Living Arrangements: Spouse/significant other Available Help at Discharge: Family, Available 24 hours/day  (daughter and son in social worker  to help) Type of Home: House Home Access: Stairs to enter [employer] of Steps: 1 Entrance Stairs-Rails: None Home Layout: Two level, Able to live on main level with bedroom/bathroom Bathroom Shower/Tub: Engineer, Manufacturing Systems: Standard  Lives With: Spouse Prior Function Level of Independence: Independent with basic ADLs, Independent with gait, Independent with homemaking with ambulation, Independent with transfers  Able to Take Stairs?: Yes Driving: Yes Vocation: Retired Administrator, Sports Baseline Vision/History: 1 Wears glasses (reading only) Ability to See in Adequate Light: 0 Adequate Patient Visual Report: No change from baseline Vision Assessment?: No apparent visual deficits Perception  Perception: Within Functional Limits Praxis Praxis: WFL Cognition Cognition Overall Cognitive Status: Within Functional Limits for tasks assessed Orientation Level: Person;Place;Situation Person: Oriented Place: Oriented Situation: Oriented Memory: Appears intact Awareness: Appears intact Problem Solving: Appears intact Safety/Judgment: Appears intact Brief Interview for Mental Status (BIMS) Repetition of Three Words (First Attempt): 3 Temporal Orientation: Year: Correct Temporal Orientation: Month: Accurate within 5 days Temporal Orientation: Day: Correct Recall: Sock: Yes, no cue required Recall: Blue: Yes, no cue required Recall: Bed: Yes, no cue required BIMS Summary Score: 15 Sensation Sensation Light Touch: Appears Intact Hot/Cold: Appears Intact Proprioception: Appears Intact Stereognosis: Impaired by gross assessment (due to edema in R hand) Coordination Gross Motor Movements are Fluid and Coordinated: No Fine Motor Movements are Fluid and Coordinated: No Coordination and Movement Description: needs support for ambulation, unable to use R hand due to edema and possible weakness in hand from stroke (difficult to assess due to  edema)  .  Has resting tremors (pt stated family history of tremors) Finger Nose Finger Test: functional on L Motor  Motor Motor: Hemiplegia  Trunk/Postural Assessment  Postural Control Postural Control: Within Functional Limits  Balance Static Sitting Balance Static Sitting - Level of Assistance: 5: Stand by assistance Dynamic Sitting Balance Dynamic Sitting - Level of Assistance: 4: Min assist;5: Stand by assistance (CGA) Static Standing Balance Static Standing - Level of Assistance: 5: Stand by assistance Dynamic Standing Balance Dynamic Standing - Level of Assistance: 4: Min assist Extremity/Trunk Assessment RUE Assessment Active Range of Motion (AROM) Comments: WFL in shoulder and elbow,   finger flexion to 20% due to edema in hand, able to extend fingers. limited wrist movement due to edema. General Strength Comments: sh flexion 4-/5 , not tested distally LUE Assessment LUE Assessment: Within Functional Limits  Care Tool Care Tool Self Care Eating   Eating Assist Level: Set up assist    Oral Care    Oral Care Assist Level: Set up assist    Bathing   Body parts bathed by patient: Chest;Abdomen;Front perineal area;Right arm;Face;Left lower leg;Right lower leg;Left upper leg;Right upper leg Body parts bathed by helper: Left arm;Buttocks   Assist Level: Minimal Assistance - Patient > 75%    Upper Body Dressing(including orthotics)   What is the patient wearing?: Pull over shirt   Assist Level: Minimal Assistance - Patient > 75%    Lower Body Dressing (excluding footwear)   What is the patient wearing?: Incontinence brief;Pants Assist for lower body dressing: Moderate Assistance - Patient 50 - 74%    Putting on/Taking off footwear   What is the patient wearing?: Non-skid slipper socks Assist for footwear: Total Assistance - Patient < 25%       Care Tool Toileting Toileting activity   Assist for toileting: Moderate Assistance - Patient 50 - 74%     Care Tool  Bed Mobility Roll left and right activity   Roll  left and right assist level: Supervision/Verbal cueing    Sit to lying activity   Sit to lying assist level: Supervision/Verbal cueing    Lying to sitting on side of bed activity   Lying to sitting on side of bed assist level: the ability to move from lying on the back to sitting on the side of the bed with no back support.: Supervision/Verbal cueing     Care Tool Transfers Sit to stand transfer   Sit to stand assist level: Supervision/Verbal cueing    Chair/bed transfer   Chair/bed transfer assist level: Minimal Assistance - Patient > 75%     Toilet transfer   Assist Level: Minimal Assistance - Patient > 75%     Care Tool Cognition  Expression of Ideas and Wants Expression of Ideas and Wants: 4. Without difficulty (complex and basic) - expresses complex messages without difficulty and with speech that is clear and easy to understand  Understanding Verbal and Non-Verbal Content Understanding Verbal and Non-Verbal Content: 4. Understands (complex and basic) - clear comprehension without cues or repetitions   Memory/Recall Ability Memory/Recall Ability : Current season;That he or she is in a hospital/hospital unit   Refer to Care Plan for Long Term Goals  SHORT TERM GOAL WEEK 1 OT Short Term Goal 1 (Week 1): Pt will be able to don shirt with supervision. OT Short Term Goal 2 (Week 1): Pt will don pants with min A. OT Short Term Goal 3 (Week 1): Pt will ambulate to toilet with CGA. OT Short Term Goal 4 (Week 1): Pt will be able to hold a cup in R hand and bring it to her mouth.  Recommendations for other services: None    Skilled Therapeutic Intervention ADL ADL Eating: Set up Where Assessed-Eating: Edge of bed Grooming: Setup Where Assessed-Grooming: Edge of bed Upper Body Bathing: Minimal assistance Where Assessed-Upper Body Bathing: Shower Lower Body Bathing: Minimal assistance Where Assessed-Lower Body Bathing:  Shower Upper Body Dressing: Minimal assistance Where Assessed-Upper Body Dressing: Edge of bed Lower Body Dressing: Moderate assistance Where Assessed-Lower Body Dressing: Edge of bed Toileting: Moderate assistance Where Assessed-Toileting: Teacher, Adult Education: Curator Method: Proofreader: Acupuncturist: Insurance Underwriter Method: Designer, Industrial/product: Information systems manager with back;Grab bars Mobility  Transfers Sit to Stand: Supervision/Verbal cueing Stand to Sit: Supervision/Verbal cueing   Discharge Criteria: Patient will be discharged from OT if patient refuses treatment 3 consecutive times without medical reason, if treatment goals not met, if there is a change in medical status, if patient makes no progress towards goals or if patient is discharged from hospital.  The above assessment, treatment plan, treatment alternatives and goals were discussed and mutually agreed upon: by patient  Elynor Kallenberger 08/03/2024, 10:02 AM

## 2024-08-03 NOTE — Progress Notes (Signed)
 Inpatient Rehabilitation Center Individual Statement of Services  Patient Name:  Carolyn Hunter  Date:  08/03/2024  Welcome to the Inpatient Rehabilitation [facility].  Our goal is to provide you with an individualized program based on your diagnosis and situation, designed to meet your specific needs.  With this comprehensive rehabilitation program, you will be expected to participate in at least 3 hours of rehabilitation therapies Monday-Friday, with modified therapy programming on the weekends.  Your rehabilitation program will include the following services:  Physical Therapy (PT), Occupational Therapy (OT), Speech Therapy (ST), 24 hour per day rehabilitation nursing, Therapeutic Recreaction (TR), Neuropsychology, Care Coordinator, Rehabilitation Medicine, Nutrition Services, and Pharmacy Services  Weekly team conferences will be held on Wednesday to discuss your progress.  Your Inpatient Rehabilitation Care Coordinator will talk with you frequently to get your input and to update you on team discussions.  Team conferences with you and your family in attendance may also be held.  Expected length of stay: 10-12 days  Overall anticipated outcome: supervision with cues  Depending on your progress and recovery, your program may change. Your Inpatient Rehabilitation Care Coordinator will coordinate services and will keep you informed of any changes. Your Inpatient Rehabilitation Care Coordinator's name and contact numbers are listed  below.  The following services may also be recommended but are not provided by the Inpatient Rehabilitation Center:  Driving Evaluations Home Health Rehabiltiation Services Outpatient Rehabilitation Services    Arrangements will be made to provide these services after discharge if needed.  Arrangements include referral to agencies that provide these services.  Your insurance has been verified to be:  [employer] Your primary doctor is:  [name]  Pertinent information will be shared with your doctor and your insurance company.  Inpatient Rehabilitation Care Coordinator:  Rhoda Clement, KEN [phone] or ELIGAH BASQUES  Information discussed with and copy given to patient by: Clement Asberry MATSU, 08/03/2024, 9:45 AM

## 2024-08-03 NOTE — Evaluation (Signed)
 Physical Therapy Assessment and Plan  Patient Details  Name: Carolyn Hunter MRN: [id number] Date of Birth: 1951/07/05  PT Diagnosis: Abnormality of gait, Cognitive deficits, Difficulty walking, Impaired sensation, and Muscle weakness Rehab Potential: Fair ELOS: 10-12 days   Today's Date: 08/03/2024 PT Individual Time: 1300-1418 PT Individual Time Calculation (min): 78 min    Hospital Problem: Principal Problem:   CVA (cerebral vascular accident) [facility])   Past Medical History:  Past Medical History:  Diagnosis Date   Anxiety    Cataract    OS   Diabetes mellitus without complication (HCC)    Diabetic retinopathy (HCC)    NPDR OU   Hyperlipidemia    Hypertension    Hypertensive retinopathy    OU   Tremors of nervous system    just when I get in a nervous Situation.   Past Surgical History:  Past Surgical History:  Procedure Laterality Date   CATARACT EXTRACTION W/PHACO Right 10/18/2018   Procedure: CATARACT EXTRACTION PHACO AND INTRAOCULAR LENS PLACEMENT (IOC);  Surgeon: Harrie Agent, MD;  Location: [facility];  Service: Ophthalmology;  Laterality: Right;  CDE: 8.11   CATARACT EXTRACTION W/PHACO Left 01/17/2019   Procedure: CATARACT EXTRACTION PHACO AND INTRAOCULAR LENS PLACEMENT (IOC);  Surgeon: Harrie Agent, MD;  Location: [facility];  Service: Ophthalmology;  Laterality: Left;  CDE: 6.38   CESAREAN SECTION     1988   COLONOSCOPY WITH PROPOFOL  N/A 05/04/2020   Procedure: COLONOSCOPY WITH PROPOFOL ;  Surgeon: Eartha Angelia Sieving, MD;  Location: [facility];  Service: Gastroenterology;  Laterality: N/A;  830   EYE SURGERY Right    Cataract extraction OD   POLYPECTOMY  05/04/2020   Procedure: POLYPECTOMY;  Surgeon: Eartha Angelia Sieving, MD;  Location: [facility];  Service: Gastroenterology;;    Assessment & Plan Clinical Impression: Patient is a 73 y.o. year old female with  PMHx hypertension, hyperlipidemia, type 2 diabetes who presented to [facility]  on 12/15 with acute encephalopathy.  Per EMS report the patient was found down on the ground after a fall and unresponsive.  She was not found until 12/15 (patient lives with her spouse who has dementia).  Upon arrival to ER patient exhibited altered mental status with GCS of 8, O2 sats 90% on room air. She was placed on nonrebreather but unable to protect airway for requiring intubation.  Initial NIHSS 25.  Labs: Glucose 400s, WBC 13.5, CK 4216, lactic acid 4.7 UA positive for UTI.  Bruising noted to the right periorbital area and swelling of right hand and forearm.  No other obvious injuries or overt infection.   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  Code activation delayed due to unknown LKW but patient exhibited inability to move right upper extremity.  Neurology was consulted who activated code stroke.  Follow-up CTA without LVO.  Neurology recommended EEG and MRI. EEG showed epileptic discharges and the patient was loaded with Keppra  without recurrent seizures. 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 2D echocardiogram showed LVEF 60 to 65%, atria normal in size.  LDL 145 and hemoglobin A1c 6.4.  Aspirin  81 mg daily and Lovenox  for DVT prophylaxis.   Hospital course complicated by encephalopathy, acute hypoxic respiratory failure, chest x-ray revealed right basilar opacity and concerns for aspiration pneumonia.  Blood cultures with streptococcal growth, repeat blood cultures done 12/16 showed no growth suspected contamination. She was placed on 5 day course of  IV ceftriaxone .  Extubated on 07/29/2024, she was n.p.o., Cortrak placed.  Speech therapy followed and conducted slums test that revealed score of 7/26, severe cognitive impairment.  Swallowing improved and the patient was started on D3 thin liquid diet on 12/21.  Pt has had diarrhea, thought to be due to laxative/antibiotic/tube  feed related, started on fiber.    Prior to arrival the patient was independent, retired and driving. She lives in a two-level home with the ability to live on the main level with her spouse.  OT evaluated and patient demonstrated total assist lower body ADLs min to mod assist for upper body ADLs and min assist with transfer. Therapy evaluations completed due to patient decreased functional mobility was admitted for a comprehensive rehab program.   Patient currently requires min with mobility secondary to muscle weakness and muscle joint tightness, decreased cardiorespiratoy endurance, unbalanced muscle activation and decreased coordination, decreased awareness, decreased problem solving, decreased safety awareness, and decreased memory, and decreased sitting balance, decreased standing balance, hemiplegia, and decreased balance strategies.  Prior to hospitalization, patient was independent  with mobility and lived with Spouse in a House home.  Home access is 1Stairs to enter.  Patient will benefit from skilled PT intervention to maximize safe functional mobility, minimize fall risk, and decrease caregiver burden for planned discharge home with 24 hour supervision.  Anticipate patient will benefit from follow up HH at discharge.  PT - End of Session Activity Tolerance: Tolerates 30+ min activity with multiple rests Endurance Deficit: Yes Endurance Deficit Description: lightheadedness and need to lay down after toileting PT Assessment Rehab Potential (ACUTE/IP ONLY): Fair PT Barriers to Discharge: Home environment access/layout;Lack of/limited family support;Weight PT Barriers to Discharge Comments: cognitive deficits, lightheadedness PT Patient demonstrates impairments in the following area(s): Balance;Endurance;Motor;Pain;Safety;Skin Integrity;Sensory PT Transfers Functional Problem(s): Bed Mobility;Bed to Chair;Car PT Locomotion Functional Problem(s): Ambulation;Stairs PT Plan PT Intensity:  Minimum of 1-2 x/day ,45 to 90 minutes PT Frequency: 5 out of 7 days PT Duration Estimated Length of Stay: 10-12 days PT Treatment/Interventions: Ambulation/gait training;Discharge planning;Functional mobility training;Psychosocial support;Therapeutic Activities;Visual/perceptual remediation/compensation;Balance/vestibular training;Disease management/prevention;Neuromuscular re-education;Skin care/wound management;Therapeutic Exercise;Wheelchair propulsion/positioning;Cognitive remediation/compensation;DME/adaptive equipment instruction;Pain management;Splinting/orthotics;UE/LE Strength taining/ROM;Community reintegration;Functional electrical stimulation;Patient/family education;Stair training;UE/LE Coordination activities PT Transfers Anticipated Outcome(s): supervision PT Locomotion Anticipated Outcome(s): supervision PT Recommendation Recommendations for Other Services: Neuropsych consult;Therapeutic Recreation consult Therapeutic Recreation Interventions: Stress management;Outing/community reintergration;Pet therapy;Kitchen group Follow Up Recommendations: Home health PT;24 hour supervision/assistance Patient destination: Home Equipment Recommended: To be determined   PT Evaluation Precautions/Restrictions Precautions Precautions: Fall Recall of Precautions/Restrictions: Intact Precaution/Restrictions Comments: R hemiparesis, Corerak, lightheadness Restrictions Weight Bearing Restrictions Per Provider Order: No General   Vital SignsTherapy Vitals Temp: 98.6 F (37 C) Resp: 16 BP: (!) 155/75 Patient Position (if appropriate): Lying Oxygen Therapy SpO2: 95 % O2 Device: Room Air Pain Pain Assessment Pain Scale: 0-10 Pain Score: 10-Worst pain ever Pain Location: Arm Pain Orientation: Right Pain Descriptors / Indicators: Aching Pain Frequency: Constant Pain Onset: Progressive Pain Intervention(s): Medication (See eMAR) Pain Interference Pain Interference Pain Effect on  Sleep: 1. Rarely or not at all Pain Interference with Therapy Activities: 1. Rarely or not at all Pain Interference with Day-to-Day Activities: 1. Rarely or not at all Home Living/Prior Functioning Home Living Available Help at Discharge: Family;Available 24 hours/day (pt son endorses between him and his sister they will be able to provide 24/7 care) Type of Home: House Home Access: Stairs to enter Entrance Stairs-Number of Steps: 1 Entrance Stairs-Rails: None Home Layout: Two level;Able to live on main  level with bedroom/bathroom Bathroom Shower/Tub: Engineer, [employer] Systems: Standard  Lives With: Spouse Prior Function Level of Independence: Independent with basic ADLs;Independent with gait;Independent with homemaking with ambulation;Independent with transfers  Able to Take Stairs?: Yes Driving: Yes Vocation: Retired Vision/Perception  Vision - History Ability to See in Adequate Light: 0 Adequate Perception Perception: Within Functional Limits Praxis Praxis: WFL  Cognition Overall Cognitive Status: Impaired/Different from baseline Arousal/Alertness: Awake/alert Orientation Level: Oriented X4 Year: 2025 Month: December Day of Week: Correct Attention: Focused;Sustained Focused Attention: Appears intact Sustained Attention: Appears intact Memory: Impaired Memory Impairment: Storage deficit;Retrieval deficit;Decreased recall of new information;Decreased short term memory Decreased Short Term Memory: Verbal basic;Functional basic Awareness: Impaired Awareness Impairment: Intellectual impairment Problem Solving: Impaired Problem Solving Impairment: Verbal complex Behaviors: Perseveration Safety/Judgment: Appears intact Sensation Sensation Light Touch: Impaired by gross assessment Hot/Cold: Appears Intact Proprioception: Appears Intact Stereognosis: Impaired by gross assessment (2/2 edema in R hand) Additional Comments: pt endorsing pins and needles in R UE. Light  touch intact B LE Coordination Gross Motor Movements are Fluid and Coordinated: No Fine Motor Movements are Fluid and Coordinated: No Coordination and Movement Description: needs support for ambulation, unable to use R hand due to edema and possible weakness in hand from stroke (difficult to assess due to edema)  .  Has resting tremors (pt stated family history of tremors) Motor  Motor Motor: Hemiplegia Motor - Skilled Clinical Observations: R hemiplegia UE>LE   Trunk/Postural Assessment  Cervical Assessment Cervical Assessment: Within Functional Limits (forward head) Thoracic Assessment Thoracic Assessment: Within Functional Limits Lumbar Assessment Lumbar Assessment: Exceptions to [facility] (posteiror pelvic tilt) Postural Control Postural Control: Within Functional Limits  Balance Balance Balance Assessed: Yes Static Sitting Balance Static Sitting - Level of Assistance: 5: Stand by assistance Dynamic Sitting Balance Dynamic Sitting - Level of Assistance: 5: Stand by assistance Static Standing Balance Static Standing - Level of Assistance: 5: Stand by assistance Dynamic Standing Balance Dynamic Standing - Level of Assistance: 4: Min assist Extremity Assessment      RLE Assessment RLE Assessment: Exceptions to [facility] General Strength Comments: grossly 3+/5 LLE Assessment LLE Assessment: Within Functional Limits  Care Tool Care Tool Bed Mobility Roll left and right activity   Roll left and right assist level: Supervision/Verbal cueing    Sit to lying activity   Sit to lying assist level: Supervision/Verbal cueing    Lying to sitting on side of bed activity   Lying to sitting on side of bed assist level: the ability to move from lying on the back to sitting on the side of the bed with no back support.: Supervision/Verbal cueing     Care Tool Transfers Sit to stand transfer   Sit to stand assist level: Contact Guard/Touching assist    Chair/bed transfer   Chair/bed  transfer assist level: Minimal Assistance - Patient > 75%    Car transfer   Car transfer assist level: Minimal Assistance - Patient > 75%      Care Tool Locomotion Ambulation   Assist level: Minimal Assistance - Patient > 75% Assistive device: Hand held assist Max distance: 137  Walk 10 feet activity   Assist level: Minimal Assistance - Patient > 75% Assistive device: Hand held assist   Walk 50 feet with 2 turns activity   Assist level: Minimal Assistance - Patient > 75% Assistive device: Hand held assist  Walk 150 feet activity Walk 150 feet activity did not occur: Safety/medical concerns (fatigue)      Walk 10 feet on  uneven surfaces activity   Assist level: Minimal Assistance - Patient > 75% Assistive device: Hand held assist  Stairs   Assist level: Minimal Assistance - Patient > 75% Stairs assistive device: 2 hand rails Max number of stairs: 4  Walk up/down 1 step activity   Walk up/down 1 step (curb) assist level: Minimal Assistance - Patient > 75% Walk up/down 1 step or curb assistive device: 2 hand rails  Walk up/down 4 steps activity   Walk up/down 4 steps assist level: Minimal Assistance - Patient > 75% Walk up/down 4 steps assistive device: 2 hand rails  Walk up/down 12 steps activity Walk up/down 12 steps activity did not occur: Safety/medical concerns (fatigue)      Pick up small objects from floor   Pick up small object from the floor assist level: Maximal Assistance - Patient 25 - 49%    Wheelchair Is the patient using a wheelchair?: Yes Type of Wheelchair: Manual   Wheelchair assist level: Dependent - Patient 0%    Wheel 50 feet with 2 turns activity   Assist Level: Dependent - Patient 0%  Wheel 150 feet activity   Assist Level: Dependent - Patient 0%    Refer to Care Plan for Long Term Goals  SHORT TERM GOAL WEEK 1 PT Short Term Goal 1 (Week 1): pt will ambulate 150 feet with LRAD and CGA PT Short Term Goal 2 (Week 1): pt will perform bed to  chair transfer with LRAD and CGA PT Short Term Goal 3 (Week 1): pt will navigate curb step with LRAD for home entry and min A or less  Recommendations for other services: Neuropsych and Therapeutic Recreation  Pet therapy, Kitchen group, Stress management, and Outing/community reintegration  Skilled Therapeutic Intervention Mobility Bed Mobility Bed Mobility: Rolling Right;Rolling Left;Supine to Sit;Sit to Supine Rolling Right: Supervision/verbal cueing Rolling Left: Supervision/Verbal cueing Supine to Sit: Supervision/Verbal cueing Sit to Supine: Supervision/Verbal cueing Transfers Transfers: Sit to Stand;Stand to Sit;Stand Pivot Transfers Sit to Stand: Contact Guard/Touching assist Stand to Sit: Contact Guard/Touching assist Stand Pivot Transfers: Minimal Assistance - Patient > 75% Transfer (Assistive device): Rolling walker Locomotion  Gait Ambulation: Yes Gait Assistance: Minimal Assistance - Patient > 75% Gait Distance (Feet): 137 Feet Assistive device: None Gait Assistance Details: Verbal cues for precautions/safety;Verbal cues for technique Gait Gait: Yes Gait Pattern: Impaired Gait Pattern: Step-through pattern;Decreased stride length;Decreased stance time - right;Poor foot clearance - right;Poor foot clearance - left Gait velocity: decreased Stairs / Additional Locomotion Stairs: Yes Stairs Assistance: Minimal Assistance - Patient > 75% Stair Management Technique: Two rails Number of Stairs: 4 Height of Stairs: 6 Ramp: Minimal Assistance - Patient >75% Wheelchair Mobility Wheelchair Mobility: Yes Wheelchair Assistance: Dependent - Patient 0% Wheelchair Parts Management: Needs assistance Distance: 150   Discharge Criteria: Patient will be discharged from PT if patient refuses treatment 3 consecutive times without medical reason, if treatment goals not met, if there is a change in medical status, if patient makes no progress towards goals or if patient is  discharged from hospital.  The above assessment, treatment plan, treatment alternatives and goals were discussed and mutually agreed upon: by patient and by family  Today's Interventions  Evaluation completed (see details above and below) with education on PT POC and goals and individual treatment initiated with focus on safety awareness.   Pt supine in bed upon arrival. Pt agreeable to therapy. Pt denies any pain.   Pt husband and son present during session. Discussed pt available assistance at  home. Pt son endorses he and his sister will be able to provide 24/7 assistance to pt at home.   Bed mobility: supervision Sit to stand: CGA Stand pivot transfer: min A Gait x137 feet with L HHA and min A Stairs: x4 6 inch steps with B handrail and min A  Pt performed ambulatory transfer to toilet during session with HHA. Pt continent of bowel and bladder. Pt immediately laying down in bathroom afterwards. Pt endorses feeling fine initially. Vitals assessed: pt then admits feeling lightheaded. Education provided to pt on importance of articulating if she feels lightheaded/dizzy to reduce fall risk, risk of recurrent injury. Pt verbalized understanding and agreeable. Pt denies any lightheadedness throughout remainder of session with PT encouraging energy conservation and rest breaks.   Supine BP: 158/69 HR 96 RA SpO2 99 Sitting EOB: BP 142/80 HR 104, SpO2 96  Pt supine in bed with all needs within reach and bed alarm on.    [facility] [place], [place], DPT  08/03/2024, 3:45 PM

## 2024-08-03 NOTE — Progress Notes (Signed)
 Right upper extremity venous duplex has been completed. Preliminary results can be found in CV Proc through chart review.   08/03/2024 10:19 AM Cathlyn Collet RVT

## 2024-08-03 NOTE — Plan of Care (Signed)
 " Problem: RH Balance Goal: LTG: Patient will maintain dynamic sitting balance (OT) Description: LTG:  Patient will maintain dynamic sitting balance with assistance during activities of daily living (OT) Flowsheets (Taken 08/03/2024 1030) LTG: Pt will maintain dynamic sitting balance during ADLs with: Independent Goal: LTG Patient will maintain dynamic standing with ADLs (OT) Description: LTG:  Patient will maintain dynamic standing balance with assist during activities of daily living (OT)  Flowsheets (Taken 08/03/2024 1030) LTG: Pt will maintain dynamic standing balance during ADLs with: Supervision/Verbal cueing   Problem: Sit to Stand Goal: LTG:  Patient will perform sit to stand in prep for activites of daily living with assistance level (OT) Description: LTG:  Patient will perform sit to stand in prep for activites of daily living with assistance level (OT) Flowsheets (Taken 08/03/2024 1030) LTG: PT will perform sit to stand in prep for activites of daily living with assistance level: Independent   Problem: RH Eating Goal: LTG Patient will perform eating w/assist, cues/equip (OT) Description: LTG: Patient will perform eating with assist, with/without cues using equipment (OT) Flowsheets (Taken 08/03/2024 1030) LTG: Pt will perform eating with assistance level of: Independent   Problem: RH Grooming Goal: LTG Patient will perform grooming w/assist,cues/equip (OT) Description: LTG: Patient will perform grooming with assist, with/without cues using equipment (OT) Flowsheets (Taken 08/03/2024 1030) LTG: Pt will perform grooming with assistance level of: Independent   Problem: RH Bathing Goal: LTG Patient will bathe all body parts with assist levels (OT) Description: LTG: Patient will bathe all body parts with assist levels (OT) Flowsheets (Taken 08/03/2024 1030) LTG: Pt will perform bathing with assistance level/cueing: Supervision/Verbal cueing   Problem: RH Dressing Goal: LTG  Patient will perform upper body dressing (OT) Description: LTG Patient will perform upper body dressing with assist, with/without cues (OT). Flowsheets (Taken 08/03/2024 1030) LTG: Pt will perform upper body dressing with assistance level of: Independent Goal: LTG Patient will perform lower body dressing w/assist (OT) Description: LTG: Patient will perform lower body dressing with assist, with/without cues in positioning using equipment (OT) Flowsheets (Taken 08/03/2024 1030) LTG: Pt will perform lower body dressing with assistance level of: Supervision/Verbal cueing   Problem: RH Toileting Goal: LTG Patient will perform toileting task (3/3 steps) with assistance level (OT) Description: LTG: Patient will perform toileting task (3/3 steps) with assistance level (OT)  Flowsheets (Taken 08/03/2024 1030) LTG: Pt will perform toileting task (3/3 steps) with assistance level: Supervision/Verbal cueing   Problem: RH Functional Use of Upper Extremity Goal: LTG Patient will use RT/LT upper extremity as a (OT) Description: LTG: Patient will use right/left upper extremity as a stabilizer/gross assist/diminished/nondominant/dominant level with assist, with/without cues during functional activity (OT) Flowsheets (Taken 08/03/2024 1030) LTG: Use of upper extremity in functional activities: RUE as diminished level LTG: Pt will use upper extremity in functional activity with assistance level of: Independent   Problem: RH Simple Meal Prep Goal: LTG Patient will perform simple meal prep w/assist (OT) Description: LTG: Patient will perform simple meal prep with assistance, with/without cues (OT). Flowsheets (Taken 08/03/2024 1030) LTG: Pt will perform simple meal prep with assistance level of: Minimal Assistance - Patient > 75%   Problem: RH Light Housekeeping Goal: LTG Patient will perform light housekeeping w/assist (OT) Description: LTG: Patient will perform light housekeeping with assistance,  with/without cues (OT). Flowsheets (Taken 08/03/2024 1030) LTG: Pt will perform light housekeeping with assistance level of: Minimal Assistance - Patient > 75%   Problem: RH Toilet Transfers Goal: LTG Patient will perform toilet  transfers w/assist (OT) Description: LTG: Patient will perform toilet transfers with assist, with/without cues using equipment (OT) Flowsheets (Taken 08/03/2024 1030) LTG: Pt will perform toilet transfers with assistance level of: Supervision/Verbal cueing   Problem: RH Tub/Shower Transfers Goal: LTG Patient will perform tub/shower transfers w/assist (OT) Description: LTG: Patient will perform tub/shower transfers with assist, with/without cues using equipment (OT) Flowsheets (Taken 08/03/2024 1030) LTG: Pt will perform tub/shower stall transfers with assistance level of: Supervision/Verbal cueing   "

## 2024-08-04 LAB — GLUCOSE, CAPILLARY
Glucose-Capillary: 165 mg/dL — ABNORMAL HIGH (ref 70–99)
Glucose-Capillary: 169 mg/dL — ABNORMAL HIGH (ref 70–99)
Glucose-Capillary: 245 mg/dL — ABNORMAL HIGH (ref 70–99)
Glucose-Capillary: 247 mg/dL — ABNORMAL HIGH (ref 70–99)
Glucose-Capillary: 281 mg/dL — ABNORMAL HIGH (ref 70–99)

## 2024-08-04 MED ORDER — GLUCERNA 1.2 CAL PO LIQD
1000.0000 mL | ORAL | Status: DC
  Administered 2024-08-04: 1000 mL
  Filled 2024-08-04 (×2): qty 1000

## 2024-08-04 MED ORDER — GABAPENTIN 100 MG PO CAPS
100.0000 mg | ORAL_CAPSULE | Freq: Three times a day (TID) | ORAL | Status: DC
  Administered 2024-08-04 – 2024-08-07 (×10): 100 mg via ORAL
  Filled 2024-08-04 (×9): qty 1

## 2024-08-04 MED ORDER — GLUCERNA 1.5 CAL PO LIQD
1000.0000 mL | ORAL | Status: DC
  Filled 2024-08-04: qty 1000

## 2024-08-04 NOTE — Progress Notes (Signed)
 "                                                        PROGRESS NOTE   Subjective/Complaints: Slept better. Still has swelling RUE. We reviewed her doppler tests. Pain seems a little better in arm. Didn't eat much yesterday  ROS: Patient denies fever, rash, sore throat, blurred vision, dizziness, nausea, vomiting, diarrhea, cough, shortness of breath or chest pain, joint or back/neck pain, headache, or mood change.    Objective:   VAS US  UPPER EXTREMITY VENOUS DUPLEX Result Date: 08/03/2024 UPPER VENOUS STUDY  Patient Name:  Carolyn Hunter  Date of Exam:   08/03/2024 Medical Rec #: [id number]       Accession #:    [id number] Date of Birth: 1951/02/09       Patient Gender: F Patient Age:   73 years Exam Location:  [facility] [place] Procedure:      VAS US  UPPER EXTREMITY VENOUS DUPLEX Referring Phys: MURRAY COLLIER --------------------------------------------------------------------------------  Indications: Swelling Risk Factors: None identified. Limitations: Bandages. Comparison Study: No prior studies. Performing Technologist: Cordella Collet RVT  Examination Guidelines: A complete evaluation includes B-mode imaging, spectral Doppler, color Doppler, and power Doppler as needed of all accessible portions of each vessel. Bilateral testing is considered an integral part of a complete examination. Limited examinations for reoccurring indications may be performed as noted.  Right Findings: +----------+------------+---------+-----------+----------+-------+ RIGHT     CompressiblePhasicitySpontaneousPropertiesSummary +----------+------------+---------+-----------+----------+-------+ IJV           Full       Yes       Yes                      +----------+------------+---------+-----------+----------+-------+ Subclavian    Full       Yes       Yes                      +----------+------------+---------+-----------+----------+-------+ Axillary      Full       Yes       Yes                       +----------+------------+---------+-----------+----------+-------+ Brachial      Full                                          +----------+------------+---------+-----------+----------+-------+ Radial        Full                                          +----------+------------+---------+-----------+----------+-------+ Ulnar         Full                                          +----------+------------+---------+-----------+----------+-------+ Cephalic      Full                                          +----------+------------+---------+-----------+----------+-------+  Basilic       Full                                          +----------+------------+---------+-----------+----------+-------+  Left Findings: +----------+------------+---------+-----------+----------+-------+ LEFT      CompressiblePhasicitySpontaneousPropertiesSummary +----------+------------+---------+-----------+----------+-------+ Subclavian               Yes       Yes                      +----------+------------+---------+-----------+----------+-------+  Summary:  Right: No evidence of deep vein thrombosis in the upper extremity. No evidence of superficial vein thrombosis in the upper extremity.  Left: No evidence of thrombosis in the subclavian.  *See table(s) above for measurements and observations.  Diagnosing physician: Debby Robertson Electronically signed by Debby Robertson on 08/03/2024 at 11:32:46 AM.    Final    Recent Labs    08/03/24 0426  WBC 9.5  HGB 11.0*  HCT 33.8*  PLT 344   Recent Labs    08/03/24 0426  NA 138  K 4.5  CL 102  CO2 27  GLUCOSE 173*  BUN 15  CREATININE 0.73  CALCIUM  9.6    Intake/Output Summary (Last 24 hours) at 08/04/2024 0746 Last data filed at 08/04/2024 0700 Gross per 24 hour  Intake 454 ml  Output --  Net 454 ml        Physical Exam: Vital Signs Blood pressure (!) 143/77, pulse 99, temperature 98.6 F (37 C),  temperature source Oral, resp. rate 18, height 5' 2 (1.575 m), weight 65.2 kg, SpO2 96%.  Constitutional: No distress . Vital signs reviewed. HEENT: NCAT, EOMI, oral membranes moist Neck: supple Cardiovascular: RRR without murmur. No JVD    Respiratory/Chest: CTA Bilaterally without wheezes or rales. Normal effort    GI/Abdomen: BS +, non-tender, non-distended Ext: no clubbing, cyanosis, RUE with 1 to 2+ edema Psych: pleasant and cooperative  Skin: Clean and intact without signs of breakdown Neuro:  Alert and oriented x 3. Fair insight and awareness. fair Memory. Processing sl delayed. Normal language and speech. Cranial nerve exam unremarkable. MMT: RUE 4- /5 prox to distal. RLE 4/5. LUE and LLE 4+ to 5/5. Sensation decreased to LT in RUE. --no change  Musculoskeletal: RUE perhaps a little less tender to palpation, swollen as above    Assessment/Plan: 1. Functional deficits which require 3+ hours per day of interdisciplinary therapy in a comprehensive inpatient rehab setting. Physiatrist is providing close team supervision and 24 hour management of active medical problems listed below. Physiatrist and rehab team continue to assess barriers to discharge/monitor patient progress toward functional and medical goals  Care Tool:  Bathing    Body parts bathed by patient: Chest, Abdomen, Front perineal area, Right arm, Face, Left lower leg, Right lower leg, Left upper leg, Right upper leg   Body parts bathed by helper: Left arm, Buttocks     Bathing assist Assist Level: Minimal Assistance - Patient > 75%     Upper Body Dressing/Undressing Upper body dressing   What is the patient wearing?: Pull over shirt    Upper body assist Assist Level: Minimal Assistance - Patient > 75%    Lower Body Dressing/Undressing Lower body dressing      What is the patient wearing?: Incontinence brief, Pants     Lower body assist Assist for lower body dressing: Moderate Assistance -  Patient 50 -  74%     Toileting Toileting    Toileting assist Assist for toileting: Independent     Transfers Chair/bed transfer  Transfers assist     Chair/bed transfer assist level: Minimal Assistance - Patient > 75%     Locomotion Ambulation   Ambulation assist      Assist level: Minimal Assistance - Patient > 75% Assistive device: Hand held assist Max distance: 137   Walk 10 feet activity   Assist     Assist level: Minimal Assistance - Patient > 75% Assistive device: Hand held assist   Walk 50 feet activity   Assist    Assist level: Minimal Assistance - Patient > 75% Assistive device: Hand held assist    Walk 150 feet activity   Assist Walk 150 feet activity did not occur: Safety/medical concerns (fatigue)         Walk 10 feet on uneven surface  activity   Assist     Assist level: Minimal Assistance - Patient > 75% Assistive device: Hand held assist   Wheelchair     Assist Is the patient using a wheelchair?: Yes Type of Wheelchair: Manual    Wheelchair assist level: Dependent - Patient 0%      Wheelchair 50 feet with 2 turns activity    Assist        Assist Level: Dependent - Patient 0%   Wheelchair 150 feet activity     Assist      Assist Level: Dependent - Patient 0%   Blood pressure (!) 143/77, pulse 99, temperature 98.6 F (37 C), temperature source Oral, resp. rate 18, height 5' 2 (1.575 m), weight 65.2 kg, SpO2 96%.  Medical Problem List and Plan: 1. 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 Zio patch at discharge.             -patient may shower             -ELOS/Goals: 10-12 days, PT sup, OT sup to min a, SLP sup            -Continue CIR therapies including PT, OT, and SLP    2.  Antithrombotics: -DVT/anticoagulation:  Mechanical: Sequential compression devices, below knee Bilateral lower extremities Pharmaceutical: Lovenox              -antiplatelet therapy:  Aspirin     3. 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 Mood/Behavior/Sleep: LCSW to follow for evaluation and support when available.              -antipsychotic agents: n/a   5. Neuropsych/cognition: This patient is capable of making decisions on her own behalf.   6. Skin/Wound Care: Routine pressure relief measures.  Bruising noted to the right eye with ecchymosis.  CT head with R lateral periorbital hematoma.    7. Fluids/Electrolytes/Nutrition: Monitor I&O and weight. Follow up labs CBC/CMP    -SLP and RD consult for Cortrak mgt. Regular diet started 12/23-consider stopping tube feeds if intake improved.   -12/25 we discussed the need to take in more by mouth before we dc NGT. She ate little yesterday but ate 70% breakfast today - reduce nocturnal glucerna to 30cc/hr -perhaps NGT can be removed tomorrow 8. 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 Will need Zioptach at discharge.                          -  Continue statin and aspirin .     9. Seizures: EEG 12/17 with epileptogenicity arising from the left central temporoparietal region. EEG 12/18 w/ moderate diffuse encephalopathy.              -Continue Keppra  500 mg BID.    10. Acute hypoxic respiratory failure: Cxr concerning for aspiration PNA. Blood cultures 12/15 w/ aerococcus viridans in 1 bottle, suspect contaminant, 12/16 Bcx no growth.              -completed 5 day course of ceftriaxone               -encourage IS, OOB.    11. T2DM: A1c 6.4. CBG this AM 170.             -monitor cbg ac/hs with SSI novolog  and Lantus  12 units daily.    -CBG (last 3)  Recent Labs    08/03/24 2035 08/04/24 0019 08/04/24 0434  GLUCAP 262* 169* 165*    12/25 -continue to cover any increases in cbg's with SSI until we transition over to PO diet 12. Normocytic anemia: Copious  blood noted during ETT exchange and bronchoscopy w/o acute bleed.              -Hgb up to 11.0    13. HLD: LDL 145, Crestor  40 mg.   14.  Diarrhea: Recent laxative antibiotic use.              - two T5 stools yesterday   -receiving bannatrol thru tube    -should improve further as we move from TF's 15. R hand swelling:             -Had xray that did not show fracture  -U/S without thrombi  -have discussed massage, elevation of RUE, AROM  -might benefit from K-tape   16.HTN:  Long term goal 130/90             -Home meds norvasc /propranolol , monitor trend   -12/25 reasonable control  LOS: 2 days A FACE TO FACE EVALUATION WAS PERFORMED  Carolyn Hunter 08/04/2024, 7:46 AM     "

## 2024-08-05 DIAGNOSIS — I69391 Dysphagia following cerebral infarction: Secondary | ICD-10-CM

## 2024-08-05 DIAGNOSIS — I63312 Cerebral infarction due to thrombosis of left middle cerebral artery: Secondary | ICD-10-CM

## 2024-08-05 LAB — GLUCOSE, CAPILLARY
Glucose-Capillary: 128 mg/dL — ABNORMAL HIGH (ref 70–99)
Glucose-Capillary: 171 mg/dL — ABNORMAL HIGH (ref 70–99)
Glucose-Capillary: 174 mg/dL — ABNORMAL HIGH (ref 70–99)
Glucose-Capillary: 213 mg/dL — ABNORMAL HIGH (ref 70–99)
Glucose-Capillary: 217 mg/dL — ABNORMAL HIGH (ref 70–99)
Glucose-Capillary: 231 mg/dL — ABNORMAL HIGH (ref 70–99)

## 2024-08-05 MED ORDER — INSULIN ASPART 100 UNIT/ML IJ SOLN
0.0000 [IU] | Freq: Three times a day (TID) | INTRAMUSCULAR | Status: DC
  Administered 2024-08-05: 4 [IU] via SUBCUTANEOUS
  Administered 2024-08-05 – 2024-08-06 (×2): 7 [IU] via SUBCUTANEOUS
  Administered 2024-08-06: 4 [IU] via SUBCUTANEOUS
  Administered 2024-08-06: 7 [IU] via SUBCUTANEOUS
  Administered 2024-08-06: 11 [IU] via SUBCUTANEOUS
  Administered 2024-08-07: 4 [IU] via SUBCUTANEOUS
  Administered 2024-08-07: 3 [IU] via SUBCUTANEOUS
  Administered 2024-08-07: 4 [IU] via SUBCUTANEOUS
  Administered 2024-08-07: 7 [IU] via SUBCUTANEOUS
  Administered 2024-08-08: 4 [IU] via SUBCUTANEOUS
  Administered 2024-08-08: 7 [IU] via SUBCUTANEOUS
  Administered 2024-08-08 – 2024-08-09 (×2): 3 [IU] via SUBCUTANEOUS
  Administered 2024-08-09: 7 [IU] via SUBCUTANEOUS
  Administered 2024-08-09: 3 [IU] via SUBCUTANEOUS
  Filled 2024-08-05: qty 3
  Filled 2024-08-05 (×2): qty 4
  Filled 2024-08-05: qty 11
  Filled 2024-08-05: qty 4
  Filled 2024-08-05 (×2): qty 7
  Filled 2024-08-05: qty 4
  Filled 2024-08-05 (×2): qty 7

## 2024-08-05 NOTE — Progress Notes (Signed)
 "                                                        PROGRESS NOTE   Subjective/Complaints: Slept better. Still has swelling RUE. We reviewed her doppler tests. Pain seems a little better in arm. Didn't eat much yesterday  ROS: Patient denies fever, rash, sore throat, blurred vision, dizziness, nausea, vomiting, diarrhea, cough, shortness of breath or chest pain, joint or back/neck pain, headache, or mood change.    Objective:   VAS US  UPPER EXTREMITY VENOUS DUPLEX Result Date: 08/03/2024 UPPER VENOUS STUDY  Patient Name:  Carolyn Hunter  Date of Exam:   08/03/2024 Medical Rec #: [id number]       Accession #:    [id number] Date of Birth: [birth year]       Patient Gender: F Patient Age:   73 years Exam Location:  [facility] Procedure:      VAS US  UPPER EXTREMITY VENOUS DUPLEX Referring Phys: MURRAY COLLIER --------------------------------------------------------------------------------  Indications: Swelling Risk Factors: None identified. Limitations: Bandages. Comparison Study: No prior studies. Performing Technologist: Cordella Collet RVT  Examination Guidelines: A complete evaluation includes B-mode imaging, spectral Doppler, color Doppler, and power Doppler as needed of all accessible portions of each vessel. Bilateral testing is considered an integral part of a complete examination. Limited examinations for reoccurring indications may be performed as noted.  Right Findings: +----------+------------+---------+-----------+----------+-------+ RIGHT     CompressiblePhasicitySpontaneousPropertiesSummary +----------+------------+---------+-----------+----------+-------+ IJV           Full       Yes       Yes                      +----------+------------+---------+-----------+----------+-------+ Subclavian    Full       Yes       Yes                      +----------+------------+---------+-----------+----------+-------+ Axillary      Full       Yes       Yes                       +----------+------------+---------+-----------+----------+-------+ Brachial      Full                                          +----------+------------+---------+-----------+----------+-------+ Radial        Full                                          +----------+------------+---------+-----------+----------+-------+ Ulnar         Full                                          +----------+------------+---------+-----------+----------+-------+ Cephalic      Full                                          +----------+------------+---------+-----------+----------+-------+  Basilic       Full                                          +----------+------------+---------+-----------+----------+-------+  Left Findings: +----------+------------+---------+-----------+----------+-------+ LEFT      CompressiblePhasicitySpontaneousPropertiesSummary +----------+------------+---------+-----------+----------+-------+ Subclavian               Yes       Yes                      +----------+------------+---------+-----------+----------+-------+  Summary:  Right: No evidence of deep vein thrombosis in the upper extremity. No evidence of superficial vein thrombosis in the upper extremity.  Left: No evidence of thrombosis in the subclavian.  *See table(s) above for measurements and observations.  Diagnosing physician: Debby Robertson Electronically signed by Debby Robertson on 08/03/2024 at 11:32:46 AM.    Final    Recent Labs    08/03/24 0426  WBC 9.5  HGB 11.0*  HCT 33.8*  PLT 344   Recent Labs    08/03/24 0426  NA 138  K 4.5  CL 102  CO2 27  GLUCOSE 173*  BUN 15  CREATININE 0.73  CALCIUM  9.6    Intake/Output Summary (Last 24 hours) at 08/05/2024 0835 Last data filed at 08/04/2024 1800 Gross per 24 hour  Intake 528 ml  Output --  Net 528 ml        Physical Exam: Vital Signs Blood pressure 130/74, pulse 98, temperature 98 F (36.7 C), resp. rate  18, height 5' 2 (1.575 m), weight 65.2 kg, SpO2 95%.   General: No acute distress Mood and affect are appropriate Heart: Regular rate and rhythm no rubs murmurs or extra sounds Lungs: Clear to auscultation, breathing unlabored, no rales or wheezes Abdomen: Positive bowel sounds, soft nontender to palpation, nondistended Extremities: No clubbing, cyanosis, or edema Skin: No evidence of breakdown, no evidence of rash   Neuro:  Alert and oriented x 3. Fair insight and awareness. fair Memory. Processing sl delayed. Normal language and speech. Cranial nerve exam unremarkable. MMT: RUE 4- /5 prox to distal. RLE 4/5. LUE and LLE 4+ to 5/5. Sensation decreased to LT in RUE. --no change  Musculoskeletal: RUE perhaps a little less tender to palpation, swollen as above    Assessment/Plan: 1. Functional deficits which require 3+ hours per day of interdisciplinary therapy in a comprehensive inpatient rehab setting. Physiatrist is providing close team supervision and 24 hour management of active medical problems listed below. Physiatrist and rehab team continue to assess barriers to discharge/monitor patient progress toward functional and medical goals  Care Tool:  Bathing    Body parts bathed by patient: Chest, Abdomen, Front perineal area, Right arm, Face, Left lower leg, Right lower leg, Left upper leg, Right upper leg   Body parts bathed by helper: Left arm, Buttocks     Bathing assist Assist Level: Minimal Assistance - Patient > 75%     Upper Body Dressing/Undressing Upper body dressing   What is the patient wearing?: Pull over shirt    Upper body assist Assist Level: Minimal Assistance - Patient > 75%    Lower Body Dressing/Undressing Lower body dressing      What is the patient wearing?: Incontinence brief, Pants     Lower body assist Assist for lower body dressing: Moderate Assistance - Patient 50 - 74%     Toileting Toileting  Toileting assist Assist for toileting:  Independent     Transfers Chair/bed transfer  Transfers assist     Chair/bed transfer assist level: Minimal Assistance - Patient > 75%     Locomotion Ambulation   Ambulation assist      Assist level: Minimal Assistance - Patient > 75% Assistive device: Hand held assist Max distance: 137   Walk 10 feet activity   Assist     Assist level: Minimal Assistance - Patient > 75% Assistive device: Hand held assist   Walk 50 feet activity   Assist    Assist level: Minimal Assistance - Patient > 75% Assistive device: Hand held assist    Walk 150 feet activity   Assist Walk 150 feet activity did not occur: Safety/medical concerns (fatigue)         Walk 10 feet on uneven surface  activity   Assist     Assist level: Minimal Assistance - Patient > 75% Assistive device: Hand held assist   Wheelchair     Assist Is the patient using a wheelchair?: Yes Type of Wheelchair: Manual    Wheelchair assist level: Dependent - Patient 0%      Wheelchair 50 feet with 2 turns activity    Assist        Assist Level: Dependent - Patient 0%   Wheelchair 150 feet activity     Assist      Assist Level: Dependent - Patient 0%   Blood pressure 130/74, pulse 98, temperature 98 F (36.7 C), resp. rate 18, height 5' 2 (1.575 m), weight 65.2 kg, SpO2 95%.  Medical Problem List and Plan: 1. 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 Zio patch at discharge.             -patient may shower             -ELOS/Goals: 10-12 days, PT sup, OT sup to min a, SLP sup            -Continue CIR therapies including PT, OT, and SLP    2.  Antithrombotics: -DVT/anticoagulation:  Mechanical: Sequential compression devices, below knee Bilateral lower extremities Pharmaceutical: Lovenox              -antiplatelet therapy: Aspirin     3. Pain Management: Tylenol  prn    -12/25 has neuropathic pain RUE--  gabapentin  100mg  BID  seems to have helped---increase to TID today   -see #15 4. Mood/Behavior/Sleep: LCSW to follow for evaluation and support when available.              -antipsychotic agents: n/a   5. Neuropsych/cognition: This patient is capable of making decisions on her own behalf.   6. Skin/Wound Care: Routine pressure relief measures.  Bruising noted to the right eye with ecchymosis.  CT head with R lateral periorbital hematoma.    7. Fluids/Electrolytes/Nutrition: Monitor I&O and weight. Follow up labs CBC/CMP    -SLP and RD consult for Cortrak mgt. Regular diet started 12/23-consider stopping tube feeds if intake improved.   -12/25 we discussed the need to take in more by mouth before we dc NGT. She ate little yesterday but ate 70% breakfast today - reduce nocturnal glucerna to 30cc/hr D/C cortrak 8. 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 Will need Zioptach at discharge.                          -  Continue statin and aspirin .     9. Seizures: EEG 12/17 with epileptogenicity arising from the left central temporoparietal region. EEG 12/18 w/ moderate diffuse encephalopathy.              -Continue Keppra  500 mg BID.    10. Acute hypoxic respiratory failure: Cxr concerning for aspiration PNA. Blood cultures 12/15 w/ aerococcus viridans in 1 bottle, suspect contaminant, 12/16 Bcx no growth.              -completed 5 day course of ceftriaxone               -encourage IS, OOB.    11. T2DM: A1c 6.4. CBG this AM 170.             -monitor cbg ac/hs with SSI novolog  and Lantus  12 units daily.    -CBG (last 3)  Recent Labs    08/04/24 2022 08/05/24 0004 08/05/24 0555  GLUCAP 281* 128* 171*    12/25 -continue to cover any increases in cbg's with SSI until we transition over to PO diet 12. Normocytic anemia: Copious blood noted during ETT exchange and bronchoscopy w/o acute bleed.              -Hgb up to 11.0    13. HLD: LDL 145, Crestor  40  mg.   14.  Diarrhea: Recent laxative antibiotic use.              - two T5 stools yesterday   -receiving bannatrol thru tube    -should improve further as we move from TF's 15. R hand swelling: Pt states she was down for 2 d laying on arm, needs retrograde massage , elevation.  CK peaked at [facility] now 321             -Had xray that did not show fracture  -U/S without thrombi  -have discussed massage, elevation of RUE, AROM  -might benefit from K-tape   16.HTN:  Long term goal 130/90             -Home meds norvasc /propranolol , monitor trend   -12/25 reasonable control Vitals:   08/04/24 1927 08/05/24 0445  BP: (!) 166/68 130/74  Pulse: 99 98  Resp: 17 18  Temp: 98 F (36.7 C) 98 F (36.7 C)  SpO2: 96% 95%    LOS: 3 days A FACE TO FACE EVALUATION WAS PERFORMED  Prentice FORBES Compton 08/05/2024, 8:35 AM     "

## 2024-08-05 NOTE — Progress Notes (Signed)
 Physical Therapy Session Note  Patient Details  Name: Carolyn Hunter MRN: [id number] Date of Birth: 1950-09-01  Today's Date: 08/05/2024 PT Individual Time: 9044-8896 PT Individual Time Calculation (min): 68 min   Short Term Goals: Week 1:  PT Short Term Goal 1 (Week 1): pt will ambulate 150 feet with LRAD and CGA PT Short Term Goal 2 (Week 1): pt will perform bed to chair transfer with LRAD and CGA PT Short Term Goal 3 (Week 1): pt will navigate curb step with LRAD for home entry and min A or less  Skilled Therapeutic Interventions/Progress Updates: Patient sitting in [facility] with family present on entrance to room. Patient alert and agreeable to PT session.   Beginning of session focused on building pt rapport with family. Patient reported unrated pain in R hand from swelling. PTA donned coban distally to proximally to assist with fluid reduction (digits wrapped individually then around hand and slightly past wrist).   Therapeutic Activity: Transfers: Pt performed sit<>stand transfers throughout session with CGA for safety. Pt with no issue controlling descent.   Patient demonstrates increased fall risk as noted by score of 17/30 on  Functional Gait Assessment.   <22/30 = predictive of falls, <20/30 = fall in 6 months, <18/30 = predictive of falls in PD MCID: 5 points stroke population, 4 points geriatric population (ANPTA Core Set of Outcome Measures for Adults with Neurologic Conditions, 2018)  Five times Sit to Stand Test (FTSS) Method: Use a straight back chair with a solid seat that is 16-18 high. Ask participant to sit on the chair with arms folded across their chest.   Instructions: Stand up and sit down as quickly as possible 5 times, keeping your arms folded across your chest.   Measurement: Stop timing when the participant stands the 5th time.   TIME: 18.05 (in seconds)  - 18.69s - 17.57s - 17.89s   Times > 13.6 seconds is associated with increased disability and  morbidity (Guralnik, 2000) Times > 15 seconds is predictive of recurrent falls in healthy individuals aged 71 and older (Buatois, et al., 2008) Normal performance values in community dwelling individuals aged 71 and older (Bohannon, 2006): 60-69 years: 11.4 seconds 70-79 years: 12.6 seconds 80-89 years: 14.8 seconds   MCID: >= 2.3 seconds for Vestibular Disorders (Meretta, 2006)  Pt ambulated throughout session with no AD and with mild imbalances noted as indicated through FGA findings.  Neuromuscular Re-ed: NMR facilitated during session with focus on dynamic standing balance, coordination, proprioceptive feedback. - toe taps orange cone with minA at first (closer to  CGA when tapping with L LE vs R LE being minA to avoid LOB). Pt progressed to closer to CGA. Pt then with 4lb ankle weight donned L LE with L HHA and required light minA - Ambulating around main gym with tidal tank in B UE's and cue for pt to keep water  level in tank. CGA throughout with rest break required. 3 rounds.   NMR performed for improvements in motor control and coordination, balance, sequencing, judgement, and self confidence/ efficacy in performing all aspects of mobility at highest level of independence.   Patient sitting in WC at end of session with brakes locked, family present and all needs within reach.      Therapy Documentation Precautions:  Precautions Precautions: Fall Recall of Precautions/Restrictions: Intact Precaution/Restrictions Comments: R hemiparesis, Corerak, lightheadness Restrictions Weight Bearing Restrictions Per Provider Order: No  Therapy/Group: Individual Therapy  Myrtle Barnhard PTA 08/05/2024, 11:57 AM

## 2024-08-05 NOTE — Progress Notes (Addendum)
 Occupational Therapy Session Note  Patient Details  Name: Carolyn Hunter MRN: [id number] Date of Birth: [birth year]  Today's Date: 08/05/2024 OT Individual Time: 1350-1501 OT Individual Time Calculation (min): 71 min    Short Term Goals: Week 1:  OT Short Term Goal 1 (Week 1): Pt will be able to don shirt with supervision. OT Short Term Goal 2 (Week 1): Pt will don pants with min A. OT Short Term Goal 3 (Week 1): Pt will ambulate to toilet with CGA. OT Short Term Goal 4 (Week 1): Pt will be able to hold a cup in R hand and bring it to her mouth.  Skilled Therapeutic Interventions/Progress Updates:   Pt greeted supine in bed, pt agreeable to OT intervention.      Transfers/bed mobility/functional mobility:  Pt completed functional ambulation > 100 ft with no AD and supervision.    ADLs:  Grooming:pt completed standing oral care with set -up assist to don paste on brush with pt completing oral care with LUE UB dressing:pt donned [place] shirt with set- up assist  LB dressing: pt donned pants with CGA for balance during sit>stand Footwear: pt donned socks with MAX A d/t impaired FMC  Bathing: pt completed bathing from sitting/standing in shower with light mIN A only to wash her LUE.  Transfers: ambulatory ADL transfers with no AD and supervision. Did discuss shower transfer with pt reporting that she usually gets down in a tub. Recommended TTB for shower, pt able to complete transfer to TTB with supervision and was receptive to the education. Pt to decide if she wants us  to order one.     IADLS: Pt completed various IADL tasks with a focus on functional use of RUE such as folding towels, retrieving items around the gym to simulate cleaning, putting clothes on hangers, and making up a bed.  Pt needed max cues to incorporate her RUE into task even as gross assist. Pt completed all tasks with distant supervision.                 Ended session with pt seated EOB with all needs within reach  and bed alarm activated.                 '  Therapy Documentation Precautions:  Precautions Precautions: Fall Recall of Precautions/Restrictions: Intact Precaution/Restrictions Comments: R hemiparesis, Corerak, lightheadness Restrictions Weight Bearing Restrictions Per Provider Order: No  Pain: Pt reported 10/10 pain in R hand, provided rest breaks as needed.   Therapy/Group: Individual Therapy  Ronal Mallie Needy 08/05/2024, 3:10 PM

## 2024-08-05 NOTE — IPOC Note (Signed)
 Overall Plan of Care [facility]) Patient Details Name: Carolyn Hunter MRN: [id number] DOB: Jun 15, 1951  Admitting Diagnosis: CVA (cerebral vascular accident) [facility])  Hospital Problems: Principal Problem:   CVA (cerebral vascular accident) [facility])     Functional Problem List: Nursing Bladder, Bowel, Safety, Edema, Endurance, Medication Management  PT Balance, Endurance, Motor, Pain, Safety, Skin Integrity, Sensory  OT Balance, Motor, Endurance, Edema, Nutrition, Pain (poor appetite)  SLP Cognition  TR         Basic ADLs: OT Dressing, Toileting, Bathing, Grooming, Eating     Advanced  ADLs: OT Light Housekeeping, Simple Meal Preparation     Transfers: PT Bed Mobility, Bed to Chair, Car  OT Toilet, Tub/Shower     Locomotion: PT Ambulation, Stairs     Additional Impairments: OT Fuctional Use of Upper Extremity  SLP Social Cognition   Problem Solving, Memory, Attention, Awareness  TR      Anticipated Outcomes Item Anticipated Outcome  Self Feeding independent  Swallowing      Basic self-care  supervision  Toileting  supervision   Bathroom Transfers supervision  Bowel/Bladder  Manage bowel w mod I assist and bladder w toileting  Transfers  supervision  Locomotion  supervision  Communication     Cognition  minA  Pain  N/A  Safety/Judgment  manage safety w cues   Therapy Plan: PT Intensity: Minimum of 1-2 x/day ,45 to 90 minutes PT Frequency: 5 out of 7 days PT Duration Estimated Length of Stay: 10-12 days OT Intensity: Minimum of 1-2 x/day, 45 to 90 minutes OT Frequency: 5 out of 7 days OT Duration/Estimated Length of Stay: 10-12 days SLP Intensity: Minumum of 1-2 x/day, 30 to 90 minutes SLP Frequency: 3 to 5 out of 7 days SLP Duration/Estimated Length of Stay: 10-12 days   Team Interventions: Nursing Interventions Patient/Family Education, Bladder Management, Bowel Management, Disease Management/Prevention, Discharge Planning, Dysphagia/Aspiration  Precaution Training, Medication Management  PT interventions Ambulation/gait training, Discharge planning, Functional mobility training, Psychosocial support, Therapeutic Activities, Visual/perceptual remediation/compensation, Balance/vestibular training, Disease management/prevention, Neuromuscular re-education, Skin care/wound management, Therapeutic Exercise, Wheelchair propulsion/positioning, Cognitive remediation/compensation, DME/adaptive equipment instruction, Pain management, Splinting/orthotics, UE/LE Strength taining/ROM, Community reintegration, Development worker, international aid stimulation, Patient/family education, Museum/gallery curator, UE/LE Coordination activities  OT Interventions Warden/ranger, Discharge planning, DME/adaptive equipment instruction, Functional mobility training, Neuromuscular re-education, Patient/family education, Psychosocial support, Pain management, Self Care/advanced ADL retraining, Therapeutic Activities, Therapeutic Exercise, UE/LE Strength taining/ROM, UE/LE Coordination activities  SLP Interventions Cognitive remediation/compensation, Functional tasks, Internal/external aids, Patient/family education, Therapeutic Exercise  TR Interventions    SW/CM Interventions Discharge Planning, Psychosocial Support, Patient/Family Education   Barriers to Discharge MD  Medical stability  Nursing Decreased caregiver support, Home environment access/layout 2 level main B+B  1 ste no rail w spouse (dementia); dtr will assist at discharge  PT Home environment access/layout, Lack of/limited family support, Weight cognitive deficits, lightheadedness  OT None    SLP      SW Insurance for SNF coverage     Team Discharge Planning: Destination: PT-Home ,OT- Home , SLP-Home Projected Follow-up: PT-Home health PT, 24 hour supervision/assistance, OT-  Home health OT, SLP-Home Health SLP, Outpatient SLP Projected Equipment Needs: PT-To be determined, OT- To be determined, SLP-None  recommended by SLP Equipment Details: PT- , OT-  Patient/family involved in discharge planning: PT- Patient, Family member/caregiver,  OT-Patient, SLP-Patient  MD ELOS: 10-12d Medical Rehab Prognosis:  Good Assessment: The patient has been admitted for CIR therapies with the diagnosis of CVA. The team will be addressing functional  mobility, strength, stamina, balance, safety, adaptive techniques and equipment, self-care, bowel and bladder mgt, patient and caregiver education, BP and DM management . Goals have been set at Mod I. Anticipated discharge destination is Home .        See Team Conference Notes for weekly updates to the plan of care

## 2024-08-05 NOTE — Progress Notes (Signed)
 Speech Language Pathology Daily Session Note  Patient Details  Name: Carolyn Hunter MRN: [id number] Date of Birth: 03-28-1951  Today's Date: 08/05/2024 SLP Individual Time: 1100-1156 SLP Individual Time Calculation (min): 56 min  Short Term Goals: Week 1: SLP Short Term Goal 1 (Week 1): Patient will recall cognitive and physical deficits with min verbal A SLP Short Term Goal 2 (Week 1): Patient will recall daily activities with min multimodal A SLP Short Term Goal 3 (Week 1): Patient will demonstrate problem solving skills in functional situations given min multimodal A  Skilled Therapeutic Interventions: SLP conducted skilled therapy session targeting cognitive goals. Patient in bathroom upon SLP arrival, then ambulated back to [facility] with supervision. Patient endorses changes to memory since stroke but endorses that this has been improving daily. SLP introduced WRAP memory strategies, then provided memory notebook to assist with daily recall. Pateint recalled activities completed in earlier PT session with supervision assist. Patient then was given a 2-minute window to memorize visual information. After this period, picture was removed and she recalled information with 90% accuracy. In final minutes of session, patient solved basic to mildly complex math based word problems with 90% accuracy given min assist. Patient then recalled WRAP memory strategies after 40 minute delay with max-total assist. Patient was left in room with call bell in reach and alarm set. SLP will continue to target goals per plan of care.        Pain Pain Assessment Pain Scale: 0-10 Pain Score: 5   Therapy/Group: Individual Therapy  Audri Kozub, M.A., CCC-SLP  Khira Cudmore A Ayse Mccartin 08/05/2024, 12:26 PM

## 2024-08-06 LAB — GLUCOSE, CAPILLARY
Glucose-Capillary: 154 mg/dL — ABNORMAL HIGH (ref 70–99)
Glucose-Capillary: 220 mg/dL — ABNORMAL HIGH (ref 70–99)
Glucose-Capillary: 218 mg/dL — ABNORMAL HIGH (ref 70–99)
Glucose-Capillary: 254 mg/dL — ABNORMAL HIGH (ref 70–99)

## 2024-08-06 MED ORDER — TRAMADOL HCL 50 MG PO TABS
50.0000 mg | ORAL_TABLET | Freq: Four times a day (QID) | ORAL | Status: DC | PRN
  Administered 2024-08-06 – 2024-08-09 (×7): 50 mg via ORAL
  Filled 2024-08-06 (×5): qty 1

## 2024-08-06 MED ORDER — METFORMIN HCL 500 MG PO TABS
500.0000 mg | ORAL_TABLET | Freq: Two times a day (BID) | ORAL | Status: DC
  Administered 2024-08-06 – 2024-08-08 (×4): 500 mg via ORAL
  Filled 2024-08-06 (×3): qty 1

## 2024-08-06 NOTE — Plan of Care (Signed)
" °  Problem: Consults Goal: RH STROKE PATIENT EDUCATION Description: See Patient Education module for education specifics  Outcome: Progressing Goal: Nutrition Consult-if indicated Outcome: Progressing Goal: Diabetes Guidelines if Diabetic/Glucose > 140 Description: If diabetic or lab glucose is > 140 mg/dl - Initiate Diabetes/Hyperglycemia Guidelines & Document Interventions  Outcome: Progressing   Problem: RH BOWEL ELIMINATION Goal: RH STG MANAGE BOWEL WITH ASSISTANCE Description: STG Manage Bowel with mod I Assistance. Outcome: Progressing Goal: RH STG MANAGE BOWEL W/MEDICATION W/ASSISTANCE Description: STG Manage Bowel with Medication with mod I Assistance. Outcome: Progressing   Problem: RH BLADDER ELIMINATION Goal: RH STG MANAGE BLADDER WITH ASSISTANCE Description: STG Manage Bladder With toileting Assistance Outcome: Progressing Goal: RH STG MANAGE BLADDER WITH MEDICATION WITH ASSISTANCE Description: STG Manage Bladder With Medication With mod I  Assistance. Outcome: Progressing   Problem: RH SAFETY Goal: RH STG ADHERE TO SAFETY PRECAUTIONS W/ASSISTANCE/DEVICE Description: STG Adhere to Safety Precautions With cues Assistance/Device. Outcome: Progressing   Problem: RH KNOWLEDGE DEFICIT Goal: RH STG INCREASE KNOWLEDGE OF DIABETES Description: Patient and dtr will be able to manage DM using educational resources for medications and dietary modification independently Outcome: Progressing Goal: RH STG INCREASE KNOWLEDGE OF HYPERTENSION Description: Patient and dtr will be able to manage HTN using educational resources for medications and dietary modification independently Outcome: Progressing Goal: RH STG INCREASE KNOWLEGDE OF HYPERLIPIDEMIA Description: Patient and dtr will be able to manage HLD using educational resources for medications and dietary modification independently Outcome: Progressing Goal: RH STG INCREASE KNOWLEDGE OF STROKE PROPHYLAXIS Description: Patient  and dtr will be able to manage secondary risks using educational resources for medications and dietary modification independently Outcome: Progressing   "

## 2024-08-06 NOTE — Progress Notes (Signed)
 Speech Language Pathology Daily Session Note  Patient Details  Name: Carolyn Hunter MRN: [id number] Date of Birth: 12-20-1950  Today's Date: 08/06/2024 SLP Individual Time: 1000-1100 SLP Individual Time Calculation (min): 60 min  Short Term Goals: Week 1: SLP Short Term Goal 1 (Week 1): Patient will recall cognitive and physical deficits with min verbal A SLP Short Term Goal 2 (Week 1): Patient will recall daily activities with min multimodal A SLP Short Term Goal 3 (Week 1): Patient will demonstrate problem solving skills in functional situations given min multimodal A  Skilled Therapeutic Interventions: SLP conducted skilled therapy session targeting cognitive goals. Patient completed basic pattern replication and color sequencing task where she benefited from supervision-min assist for problem solving. Patient recalled events from earlier OT session with detail given supervision assist. Patient then completed mildly complex crossword task where she interpreted clues with min assist, organized task with min-mod assist, and benefited from verbalization of context clues. At end of session, patient recalled WRAP memory strategies introduced previous day with supervision-minA. Patient was left in room with call bell in reach and alarm set. SLP will continue to target goals per plan of care.        Pain Pain Assessment Pain Scale: 0-10 Pain Score: 10-Worst pain ever Pain Location: Hand Pain Orientation: Right Pain Intervention(s): Medication (See eMAR)  Therapy/Group: Individual Therapy  Rosina Downy, M.A., CCC-SLP  Prospero Mahnke A Idalys Konecny 08/06/2024, 11:35 AM

## 2024-08-06 NOTE — Progress Notes (Signed)
 Orthopedic Tech Progress Note Patient Details:  Carolyn Hunter Jan 23, 1951 [id number] Compression gloves have been ordered from [facility]  Patient ID: Carolyn Hunter, female   DOB: 02-21-1951, 73 y.o.   MRN: [id number]  Massie FORBES Bar 08/06/2024, 9:23 AM

## 2024-08-06 NOTE — Progress Notes (Signed)
 Occupational Therapy Session Note  Patient Details  Name: Carolyn Hunter MRN: [id number] Date of Birth: Apr 29, 1951  Today's Date: 08/06/2024 OT Individual Time: 0820-0920 OT Individual Time Calculation (min): 60 min    Short Term Goals: Week 1:  OT Short Term Goal 1 (Week 1): Pt will be able to don shirt with supervision. OT Short Term Goal 2 (Week 1): Pt will don pants with min A. OT Short Term Goal 3 (Week 1): Pt will ambulate to toilet with CGA. OT Short Term Goal 4 (Week 1): Pt will be able to hold a cup in R hand and bring it to her mouth.   Skilled Therapeutic Interventions/Progress Updates:     Initial Encounter: The patient indicated that she had difficulty resing during the night secondary to R hand pain due to edema.  The patient indicated that she informed nursing and was administered tylenol  to  address her pain. The pt was in agreement with completing BADL related task in showering and dressing for starting her day  BADL Related Task Performance: The pt was transported to the shower and was able to transfer from w/c LOF with CGA using the grab bars for additional balance for coming in to stand while ambulating into the shower. The pt was able to doff her over head shirt with MinA, the pt was MinA for doffing her underwear and pants, she was Dep with her non skid socks. The pt was able to bathe her UB with s/uA, she was able to come into stand using the grab bars for washing front and back peri area with CloseS. The pt was able to dry off and dress UB using hemi technique with MinA and initial cues, she was MinA and initial cues for donning her LB clothing for underwear and pants. The pt was MaxA for non-skid socks. The pt was transported to the sink and was able to brush her teeth with s/uA.  The patient's RUE was rewrapped with coban to address her swelling following the shower and elevated for good anatomical positioning to reduce the edema. At the end on the session, the pt was  able to transfer back to bed LOF using the arm of the w/c for additional balance at closeS with nursing present to resume care.   Therapy Documentation Precautions:  Precautions Precautions: Fall Recall of Precautions/Restrictions: Intact Precaution/Restrictions Comments: R hemiparesis, Corerak, lightheadness Restrictions Weight Bearing Restrictions Per Provider Order: No Therapy/Group:   Elvera JONETTA Mace 08/06/2024, 12:07 PM

## 2024-08-06 NOTE — Progress Notes (Signed)
 Physical Therapy Session Note  Patient Details  Name: Carolyn Hunter MRN: [id number] Date of Birth: 01-19-1951  Today's Date: 08/06/2024 PT Individual Time: 8696-8586 PT Individual Time Calculation (min): 70 min   Short Term Goals: Week 1:  PT Short Term Goal 1 (Week 1): pt will ambulate 150 feet with LRAD and CGA PT Short Term Goal 2 (Week 1): pt will perform bed to chair transfer with LRAD and CGA PT Short Term Goal 3 (Week 1): pt will navigate curb step with LRAD for home entry and min A or less  Skilled Therapeutic Interventions/Progress Updates: Patient supine in bed with family present on entrance to room. Patient alert and agreeable to PT session.   Patient reported no pain  Therapeutic Activity: Bed Mobility: Pt performed supine<>sit on EOB independently. Transfers: Pt performed sit<>stand transfers throughout session with supervision no AD. Pt ambulated from room<>main gym without AD and with overall supervision and no LOB to occur.  - PTA doffed R coban and donned new coban on digits and hand and slightly past wrist (wrapping distally to proximally) with education for pt to doff at night and to elevate to further decrease swelling.  - Pt performed floor transfer on mat with cue for sequence to L sidelying to have L UE assist with truncal elevation up to tall kneeling and use of stable surface (or furniture) to further assist to standing position. Pt also educated on self-assessing for any bodily injuries - Pt performed curb step navigation x 2 (3 curb then up to 2nd curb step which was 8 step placed in front). Pt stated using pillar beam at home made of concrete prior to arrival so pt used hand railing to simulate and did so with overall supervision.   Neuromuscular Re-ed: - Pt ambulated around main gym hallway with cue to locate laminated discs (no specific color or number). Pt did so without AD and with overall supervision and pt demonstrating ability to perform head rotation  without LOB. Pt required hinted cue to increase overall visual tracking of discs that were above head. Pt also picked up discs that fell off the ground all with supervision.  - Pt ambulating with tidal tank in B UE and PTA providing perturbations in various directions with initial demonstrative cue on pt to perform necessary stepping strategy to maintain standing balance. Pt had no LOB (light perturbations performed) with addition to pt to perform quick pivots and retro-step  - Pt standing on airex pad for 2.5 min with initially requiring modA to prevent posterior LOB (cue to look forward ahead vs at ground provided pt with necessary vestibular stabilization to increase static standing balance vs down at ground). Pt progressed to overall CGA/close supervision   NMR performed for improvements in motor control and coordination, balance, sequencing, judgement, and self confidence/ efficacy in performing all aspects of mobility at highest level of independence.   Patient supine in bed at end of session with brakes locked, family present, bed alarm set, and all needs within reach.      Therapy Documentation Precautions:  Precautions Precautions: Fall Recall of Precautions/Restrictions: Intact Precaution/Restrictions Comments: R hemiparesis, Corerak, lightheadness Restrictions Weight Bearing Restrictions Per Provider Order: No  Therapy/Group: Individual Therapy  Monie Shere PTA 08/06/2024, 2:15 PM

## 2024-08-06 NOTE — Progress Notes (Signed)
 "                                                        PROGRESS NOTE   Subjective/Complaints:  Pain still in RUE- from swelling and stiffness. Also has some burning and throbbing- tylenol  not working well- asking for something else.  Pt had RUE elevated on 1 pillow- gave 2.    LBM yesterday    ROS:   Objective:   No results found.  No results for input(s): WBC, HGB, HCT, PLT in the last 72 hours.  No results for input(s): NA, K, CL, CO2, GLUCOSE, BUN, CREATININE, CALCIUM  in the last 72 hours.   Intake/Output Summary (Last 24 hours) at 08/06/2024 1206 Last data filed at 08/06/2024 0800 Gross per 24 hour  Intake 360 ml  Output --  Net 360 ml        Physical Exam: Vital Signs Blood pressure 133/79, pulse 82, temperature 97.9 F (36.6 C), resp. rate 16, height 5' 2 (1.575 m), weight 65.7 kg, SpO2 97%.    General: awake, alert, appropriate, NAD HENT: conjugate gaze; oropharynx moist CV: regular rate; no JVD Pulmonary: CTA B/L; no W/R/R- good air movement GI: soft, NT, ND, (+)BS Psychiatric: appropriate Neurological: Ox3 Extremities- RUE esp hand and fingers/thumb, significantly swollen even though using ktape to be wrapped-   Neuro:  Alert and oriented x 3. Fair insight and awareness. fair Memory. Processing sl delayed. Normal language and speech. Cranial nerve exam unremarkable. MMT: RUE 4- /5 prox to distal. RLE 4/5. LUE and LLE 4+ to 5/5. Sensation decreased to LT in RUE. --no change  Musculoskeletal: RUE perhaps a little less tender to palpation, swollen as above    Assessment/Plan: 1. Functional deficits which require 3+ hours per day of interdisciplinary therapy in a comprehensive inpatient rehab setting. Physiatrist is providing close team supervision and 24 hour management of active medical problems listed below. Physiatrist and rehab team continue to assess barriers to discharge/monitor patient progress toward functional and  medical goals  Care Tool:  Bathing    Body parts bathed by patient: Chest, Abdomen, Front perineal area, Right arm, Face, Left lower leg, Right lower leg, Left upper leg, Right upper leg   Body parts bathed by helper: Left arm, Buttocks     Bathing assist Assist Level: Minimal Assistance - Patient > 75%     Upper Body Dressing/Undressing Upper body dressing   What is the patient wearing?: Pull over shirt    Upper body assist Assist Level: Set up assist    Lower Body Dressing/Undressing Lower body dressing      What is the patient wearing?: Underwear/pull up, Pants     Lower body assist Assist for lower body dressing: Contact Guard/Touching assist     Toileting Toileting    Toileting assist Assist for toileting: Independent     Transfers Chair/bed transfer  Transfers assist     Chair/bed transfer assist level: Supervision/Verbal cueing     Locomotion Ambulation   Ambulation assist      Assist level: Minimal Assistance - Patient > 75% Assistive device: Hand held assist Max distance: 137   Walk 10 feet activity   Assist     Assist level: Minimal Assistance - Patient > 75% Assistive device: Hand held assist   Walk 50 feet activity  Assist    Assist level: Minimal Assistance - Patient > 75% Assistive device: Hand held assist    Walk 150 feet activity   Assist Walk 150 feet activity did not occur: Safety/medical concerns (fatigue)         Walk 10 feet on uneven surface  activity   Assist     Assist level: Minimal Assistance - Patient > 75% Assistive device: Hand held assist   Wheelchair     Assist Is the patient using a wheelchair?: Yes Type of Wheelchair: Manual    Wheelchair assist level: Dependent - Patient 0%      Wheelchair 50 feet with 2 turns activity    Assist        Assist Level: Dependent - Patient 0%   Wheelchair 150 feet activity     Assist      Assist Level: Dependent - Patient 0%    Blood pressure 133/79, pulse 82, temperature 97.9 F (36.6 C), resp. rate 16, height 5' 2 (1.575 m), weight 65.7 kg, SpO2 97%.  Medical Problem List and Plan: 1. 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 Zio patch at discharge.             -patient may shower             -ELOS/Goals: 10-12 days, PT sup, OT sup to min a, SLP sup           Con't CIR 2.  Antithrombotics: -DVT/anticoagulation:  Mechanical: Sequential compression devices, below knee Bilateral lower extremities Pharmaceutical: Lovenox              -antiplatelet therapy: Aspirin     3. Pain Management: Tylenol  prn    -12/25 has neuropathic pain RUE--  gabapentin  100mg  BID seems to have helped---increase to TID today   -see #15  12/27- Pt reports gabapentin  and tylenol  slightly helpful but always painful- until gabapentin  kicks in enough, will try Tramadol  prn for severe pain- also had her arm not elevated enough- gave 2nd pillow 4. Mood/Behavior/Sleep: LCSW to follow for evaluation and support when available.              -antipsychotic agents: n/a   5. Neuropsych/cognition: This patient is capable of making decisions on her own behalf.   6. Skin/Wound Care: Routine pressure relief measures.  Bruising noted to the right eye with ecchymosis.  CT head with R lateral periorbital hematoma.    7. Fluids/Electrolytes/Nutrition: Monitor I&O and weight. Follow up labs CBC/CMP    -SLP and RD consult for Cortrak mgt. Regular diet started 12/23-consider stopping tube feeds if intake improved.   -12/25 we discussed the need to take in more by mouth before we dc NGT. She ate little yesterday but ate 70% breakfast today - reduce nocturnal glucerna to 30cc/hr D/C cortrak 8. 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 Will need Zioptach at discharge.                          -Continue statin and aspirin .     9. Seizures: EEG 12/17  with epileptogenicity arising from the left central temporoparietal region. EEG 12/18 w/ moderate diffuse encephalopathy.              -Continue Keppra  500 mg BID.    10. Acute hypoxic respiratory failure: Cxr concerning for aspiration PNA. Blood cultures 12/15 w/ aerococcus viridans in 1 bottle, suspect contaminant, 12/16 Bcx  no growth.              -completed 5 day course of ceftriaxone               -encourage IS, OOB.    11. T2DM: A1c 6.4. CBG this AM 170.             -monitor cbg ac/hs with SSI novolog  and Lantus  12 units daily.    -CBG (last 3)  Recent Labs    08/05/24 2008 08/06/24 0545 08/06/24 1124  GLUCAP 213* 154* 220*    12/25 -continue to cover any increases in cbg's with SSI until we transition over to PO diet  12/27- Regular diet now- BUN/Cr ok- -will try Metformin  500 mg BID for now  And monitor- not allergic 12. Normocytic anemia: Copious blood noted during ETT exchange and bronchoscopy w/o acute bleed.              -Hgb up to 11.0    13. HLD: LDL 145, Crestor  40 mg.   14.  Diarrhea: Recent laxative antibiotic use.              - two T5 stools yesterday   -receiving bannatrol thru tube    -should improve further as we move from TF's  12/27- TF's/Cortrak off- LBM 12/25 15. R hand swelling: Pt states she was down for 2 d laying on arm, needs retrograde massage , elevation.  CK peaked at [facility] now 321             -Had xray that did not show fracture  -U/S without thrombi  -have discussed massage, elevation of RUE, AROM  -might benefit from K-tape   12/27- hand significantly swollen- ordered 2 compression gloves- 1 with full glove and 1 with partial fingers- since fingers also swollen 16.HTN:  Long term goal 130/90             -Home meds norvasc /propranolol , monitor trend   -12/25 reasonable control  12/27- Pt's BP is slightly elevated/normalized- con't regimen Vitals:   08/06/24 0545 08/06/24 1127  BP: (!) 144/75 133/79  Pulse: 92 82  Resp: 18 16  Temp: 97.8 F  (36.6 C) 97.9 F (36.6 C)  SpO2: 94% 97%     I spent a total of  51  minutes on total care today- >50% coordination of care- due to  D/w nursing x2; as well as review of labs, vitals and B/B- also added gloves, tramadol  and metformin  after going over her CBG's as well    LOS: 4 days A FACE TO FACE EVALUATION WAS PERFORMED  Oneill Bais 08/06/2024, 12:06 PM     "

## 2024-08-07 LAB — GLUCOSE, CAPILLARY
Glucose-Capillary: 150 mg/dL — ABNORMAL HIGH (ref 70–99)
Glucose-Capillary: 210 mg/dL — ABNORMAL HIGH (ref 70–99)
Glucose-Capillary: 170 mg/dL — ABNORMAL HIGH (ref 70–99)
Glucose-Capillary: 169 mg/dL — ABNORMAL HIGH (ref 70–99)

## 2024-08-07 MED ORDER — GABAPENTIN 100 MG PO CAPS
200.0000 mg | ORAL_CAPSULE | Freq: Three times a day (TID) | ORAL | Status: DC
  Administered 2024-08-07 – 2024-08-09 (×6): 200 mg via ORAL
  Filled 2024-08-07 (×2): qty 2

## 2024-08-07 NOTE — Progress Notes (Signed)
 Physical Therapy Session Note  Patient Details  Name: Carolyn Hunter MRN: [id number] Date of Birth: [birth year]  Today's Date: 08/07/2024 PT Individual Time: 9241-9141; 1120-1205 PT Individual Time Calculation (min): 60 min ; 45 min  Short Term Goals: Week 1:  PT Short Term Goal 1 (Week 1): pt will ambulate 150 feet with LRAD and CGA PT Short Term Goal 2 (Week 1): pt will perform bed to chair transfer with LRAD and CGA PT Short Term Goal 3 (Week 1): pt will navigate curb step with LRAD for home entry and min A or less  Skilled Therapeutic Interventions/Progress Updates:   Session 1: Pt received lying in bed motivated for therapy. She reports R hand pain to be 8/10 but received pain meds an hour ago. PT asked if she wanted another medication and she responded no. Pt agreeable to PT tx. Pt went to the toilet first with bed mobility indep and toilet transfer CGA to SBA  Gait Training: without assistive device x 228 ft, x 260 ft, x 216 ft, x 302 ft with PT CGA and verbal cues for posture and stride.  Therex: seated LAQ x 20, seated windshield wiper x 20; standing bil DF x 20, supine SLR x 20, bridge x 20,SAQ unilat and bil x 20 each 3 lbs, VMO SLR x 20, sidelying clam shell x 20 with PT verbal cues for technique. Pt needed verbal cues for quality of exercise instead of speed.  NMR: standing UE alternating reaches x 20, unilat step taps forward x 20; standing EC NBOS static standing 1 min with PT close SBA for safety with pt with slight sway; gait x 102 ft with PT light CGA with stop/go/turn ambulating for further balance challenge with pt able to perform without LOB.   Pt returned to bed in room. No increased complaints of pain, bed alarm on, all needs within reach.  Session 2:Pt received in bed and agreeable to PT. Reports no pain. Visitor present.   Gait: GT x 282 ft, x 302 ft, x 262 ft, x 300 ft with PT light CGA and verbal cues for posture and stride. Gt broken up throughout session and  to note, with fatigue, gait becomes more antalgic and less fluid.  NMR: 6 in step taps unilat bil x 20, lateral walks across gym floor x 28 ft x 3 bouts, retro walks around gym floor x 28 ft x 3 bouts, STS without Ues x 20, Airex lat step downs x 25, standing hip abduction x 25. All single limb therex to challenge balance and joint co-contraction on standing leg. PT CGA for all and no UE support on furniture used. Pt fatigued with tx.  Pt left in room in bed, visitor still present, no complaints of pain, all needs within reach.      Therapy Documentation Precautions:  Precautions Precautions: Fall Recall of Precautions/Restrictions: Intact Precaution/Restrictions Comments: R hemiparesis, Corerak, lightheadness Restrictions Weight Bearing Restrictions Per Provider Order: No    Therapy/Group: Individual Therapy  Alger Ada 08/07/2024, 7:06 AM

## 2024-08-07 NOTE — Plan of Care (Signed)
" °  Problem: Consults Goal: RH STROKE PATIENT EDUCATION Description: See Patient Education module for education specifics  Outcome: Progressing Goal: Nutrition Consult-if indicated Outcome: Progressing Goal: Diabetes Guidelines if Diabetic/Glucose > 140 Description: If diabetic or lab glucose is > 140 mg/dl - Initiate Diabetes/Hyperglycemia Guidelines & Document Interventions  Outcome: Progressing   Problem: RH BOWEL ELIMINATION Goal: RH STG MANAGE BOWEL WITH ASSISTANCE Description: STG Manage Bowel with mod I Assistance. Outcome: Progressing Goal: RH STG MANAGE BOWEL W/MEDICATION W/ASSISTANCE Description: STG Manage Bowel with Medication with mod I Assistance. Outcome: Progressing   Problem: RH BLADDER ELIMINATION Goal: RH STG MANAGE BLADDER WITH ASSISTANCE Description: STG Manage Bladder With toileting Assistance Outcome: Progressing Goal: RH STG MANAGE BLADDER WITH MEDICATION WITH ASSISTANCE Description: STG Manage Bladder With Medication With mod I  Assistance. Outcome: Progressing   Problem: RH SAFETY Goal: RH STG ADHERE TO SAFETY PRECAUTIONS W/ASSISTANCE/DEVICE Description: STG Adhere to Safety Precautions With cues Assistance/Device. Outcome: Progressing   Problem: RH KNOWLEDGE DEFICIT Goal: RH STG INCREASE KNOWLEDGE OF DIABETES Description: Patient and dtr will be able to manage DM using educational resources for medications and dietary modification independently Outcome: Progressing Goal: RH STG INCREASE KNOWLEDGE OF HYPERTENSION Description: Patient and dtr will be able to manage HTN using educational resources for medications and dietary modification independently Outcome: Progressing Goal: RH STG INCREASE KNOWLEGDE OF HYPERLIPIDEMIA Description: Patient and dtr will be able to manage HLD using educational resources for medications and dietary modification independently Outcome: Progressing Goal: RH STG INCREASE KNOWLEDGE OF STROKE PROPHYLAXIS Description: Patient  and dtr will be able to manage secondary risks using educational resources for medications and dietary modification independently Outcome: Progressing   "

## 2024-08-07 NOTE — Progress Notes (Signed)
 "                                                        PROGRESS NOTE   Subjective/Complaints:  Pt reports not eating due to decreased appetite, but taking supplements- drank entire Ensure this AM.  Ate <50% of plate at best.   Feels like has less swelling of R hand/wrist. - they took off Koban. She reports tramadol  didn't help much, helped a little.   LBM yesterday.   ROS:   Objective:   No results found.  No results for input(s): WBC, HGB, HCT, PLT in the last 72 hours.  No results for input(s): NA, K, CL, CO2, GLUCOSE, BUN, CREATININE, CALCIUM  in the last 72 hours.   Intake/Output Summary (Last 24 hours) at 08/07/2024 0933 Last data filed at 08/07/2024 0826 Gross per 24 hour  Intake 720 ml  Output --  Net 720 ml        Physical Exam: Vital Signs Blood pressure (!) 159/68, pulse 94, temperature 97.9 F (36.6 C), resp. rate 18, height 5' 2 (1.575 m), weight 65.7 kg, SpO2 97%.      General: awake, alert, appropriate, supine in bed; NAD HENT: conjugate gaze; oropharynx moist CV: regular rate and rhythm-rate in 90's; no JVD Pulmonary: CTA B/L; no W/R/R- good air movement GI: soft, NT, ND, (+)BS- normoactive Psychiatric: appropriate but flat affect Neurological: Ox3  Extremities- RUE esp hand and fingers/thumb, significantly swollen even though using ktape to be wrapped-  as of 12/28- ktape off- and swelling actually looks better- doesn't have compression glove yet  Neuro:  Alert and oriented x 3. Fair insight and awareness. fair Memory. Processing sl delayed. Normal language and speech. Cranial nerve exam unremarkable. MMT: RUE 4- /5 prox to distal. RLE 4/5. LUE and LLE 4+ to 5/5. Sensation decreased to LT in RUE. --no change  Musculoskeletal: RUE perhaps a little less tender to palpation, swollen as above    Assessment/Plan: 1. Functional deficits which require 3+ hours per day of interdisciplinary therapy in a comprehensive inpatient  rehab setting. Physiatrist is providing close team supervision and 24 hour management of active medical problems listed below. Physiatrist and rehab team continue to assess barriers to discharge/monitor patient progress toward functional and medical goals  Care Tool:  Bathing    Body parts bathed by patient: Chest, Abdomen, Front perineal area, Right arm, Face, Left lower leg, Right lower leg, Left upper leg, Right upper leg, Buttocks, Left arm   Body parts bathed by helper: Left arm, Buttocks     Bathing assist Assist Level: Contact Guard/Touching assist     Upper Body Dressing/Undressing Upper body dressing   What is the patient wearing?: Pull over shirt    Upper body assist Assist Level: Minimal Assistance - Patient > 75%    Lower Body Dressing/Undressing Lower body dressing      What is the patient wearing?: Underwear/pull up, Pants     Lower body assist Assist for lower body dressing: Minimal Assistance - Patient > 75%     Toileting Toileting    Toileting assist Assist for toileting: Independent     Transfers Chair/bed transfer  Transfers assist     Chair/bed transfer assist level: Contact Guard/Touching assist     Locomotion Ambulation   Ambulation assist  Assist level: Minimal Assistance - Patient > 75% Assistive device: Hand held assist Max distance: 137   Walk 10 feet activity   Assist     Assist level: Minimal Assistance - Patient > 75% Assistive device: Hand held assist   Walk 50 feet activity   Assist    Assist level: Minimal Assistance - Patient > 75% Assistive device: Hand held assist    Walk 150 feet activity   Assist Walk 150 feet activity did not occur: Safety/medical concerns (fatigue)         Walk 10 feet on uneven surface  activity   Assist     Assist level: Minimal Assistance - Patient > 75% Assistive device: Hand held assist   Wheelchair     Assist Is the patient using a wheelchair?:  Yes Type of Wheelchair: Manual    Wheelchair assist level: Dependent - Patient 0%      Wheelchair 50 feet with 2 turns activity    Assist        Assist Level: Dependent - Patient 0%   Wheelchair 150 feet activity     Assist      Assist Level: Dependent - Patient 0%   Blood pressure (!) 159/68, pulse 94, temperature 97.9 F (36.6 C), resp. rate 18, height 5' 2 (1.575 m), weight 65.7 kg, SpO2 97%.  Medical Problem List and Plan: 1. 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 Zio patch at discharge.             -patient may shower             -ELOS/Goals: 10-12 days, PT sup, OT sup to min a, SLP sup           Con't CIR PT, OT and SLP 2.  Antithrombotics: -DVT/anticoagulation:  Mechanical: Sequential compression devices, below knee Bilateral lower extremities Pharmaceutical: Lovenox              -antiplatelet therapy: Aspirin     3. Pain Management: Tylenol  prn    -12/25 has neuropathic pain RUE--  gabapentin  100mg  BID seems to have helped---increase to TID today   -see #15  12/27- Pt reports gabapentin  and tylenol  slightly helpful but always painful- until gabapentin  kicks in enough, will try Tramadol  prn for severe pain- also had her arm not elevated enough- gave 2nd pillow  12/28- since tramadol  not effective really, will increase gabapentin  to 200 mg TID- won't go higher at this time- if tramadol  not effective, then can d/c, but is taking it some.  4. Mood/Behavior/Sleep: LCSW to follow for evaluation and support when available.              -antipsychotic agents: n/a   5. Neuropsych/cognition: This patient is capable of making decisions on her own behalf.   6. Skin/Wound Care: Routine pressure relief measures.  Bruising noted to the right eye with ecchymosis.  CT head with R lateral periorbital hematoma.    7. Fluids/Electrolytes/Nutrition: Monitor I&O and weight. Follow up labs CBC/CMP    -SLP and RD consult for  Cortrak mgt. Regular diet started 12/23-consider stopping tube feeds if intake improved.   -12/25 we discussed the need to take in more by mouth before we dc NGT. She ate little yesterday but ate 70% breakfast today - reduce nocturnal glucerna to 30cc/hr D/C cortrak 12/28- said no appetite- drinking Ensure- ate 25-maybe 50% of tray at best today for breakfast. Might benefit from Remeron or Megace. 8. Acute left frontotemporal ischemic stroke:  etiology felt to be cryptogenic.Follow up with [facility] outpatient. Will need Zioptach at discharge.                          -Continue statin and aspirin .     9. Seizures: EEG 12/17 with epileptogenicity arising from the left central temporoparietal region. EEG 12/18 w/ moderate diffuse encephalopathy.              -Continue Keppra  500 mg BID.    10. Acute hypoxic respiratory failure: Cxr concerning for aspiration PNA. Blood cultures 12/15 w/ aerococcus viridans in 1 bottle, suspect contaminant, 12/16 Bcx no growth.              -completed 5 day course of ceftriaxone               -encourage IS, OOB.    11. T2DM: A1c 6.4. CBG this AM 170.             -monitor cbg ac/hs with SSI novolog  and Lantus  12 units daily.    -CBG (last 3)  Recent Labs    08/06/24 1622 08/06/24 2006 08/07/24 0603  GLUCAP 218* 254* 150*    12/25 -continue to cover any increases in cbg's with SSI until we transition over to PO diet  12/27- Regular diet now- BUN/Cr ok- -will try Metformin  500 mg BID for now  And monitor- not allergic  12/28- CBGs slightly better this Am at 150- was running mid 200s, so hopefully metformin  starting to kick in 12. Normocytic anemia: Copious blood noted during ETT exchange and bronchoscopy w/o acute bleed.              -Hgb up to 11.0    13. HLD: LDL 145, Crestor  40 mg.   14.  Diarrhea: Recent laxative antibiotic use.              - two T5 stools yesterday   -receiving bannatrol thru tube    -should improve further as we move from TF's  12/27-  TF's/Cortrak off- LBM 12/25 15. R hand swelling: Pt states she was down for 2 d laying on arm, needs retrograde massage , elevation.  CK peaked at [facility] now 321             -Had xray that did not show fracture  -U/S without thrombi  -have discussed massage, elevation of RUE, AROM  -might benefit from K-tape   12/27- hand significantly swollen- ordered 2 compression gloves- 1 with full glove and 1 with partial fingers- since fingers also swollen  12/28- Ktape off- but compression glove not here yet- is propped up on pillow 16.HTN:  Long term goal 130/90             -Home meds norvasc /propranolol , monitor trend   -12/25 reasonable control  12/27- 12/28-Pt's BP is slightly elevated/normalized- con't regimen Vitals:   08/06/24 2006 08/07/24 0605  BP: (!) 147/75 (!) 159/68  Pulse: 93 94  Resp: 18 18  Temp: 97.6 F (36.4 C) 97.9 F (36.6 C)  SpO2: 97% 97%      I spent a total of  43  minutes on total care today- >50% coordination of care- due to  D/w pt about her CBGs, appetite, swelling and pain- added/increased gabapentin - made suggestions on appetite- d/w nursing and reviewed vitals, labs and B/B  LOS: 5 days A FACE TO FACE EVALUATION WAS PERFORMED  Keimya Briddell 08/07/2024, 9:33 AM     "

## 2024-08-07 NOTE — Progress Notes (Signed)
 Occupational Therapy Session Note  Patient Details  Name: Carolyn Hunter MRN: [id number] Date of Birth: Jun 20, 1951  Today's Date: 08/07/2024 OT Individual Time: 1000-1041 and 1345-1420 OT Individual Time Calculation (min): 41 min and 40 min Missed 20 mins in PM session 2/2 fatigue    Short Term Goals: Week 1:  OT Short Term Goal 1 (Week 1): Pt will be able to don shirt with supervision. OT Short Term Goal 2 (Week 1): Pt will don pants with min A. OT Short Term Goal 3 (Week 1): Pt will ambulate to toilet with CGA. OT Short Term Goal 4 (Week 1): Pt will be able to hold a cup in R hand and bring it to her mouth.   Skilled Therapeutic Interventions/Progress Updates:    Session 1: Pt bed level at time of session, no pain. Spouse present in room and daughter present initially. Pt ambulated room <> gym with Supervision and no AD. Focused on RUE with the following: retrograde massage for R hand distally > proximally to reduce edema, notable improvement noted. Focused on functional grasp/release pattern for R hand with foam (issued to pt to keep in room), individual and composite digits for flexion and extension, towel squeezes, towel folding, ringing out, etc. Back in room bed level alarm on call bell in reach.   Session 2: Pt bed level at time of session, initially declining OT session 2/2 fatigue stating she was too tired. Educated on importance of OT as well as pt would need to make up missed time, pt agreeable to shower as discussed in AM session. Pt ambulating thorughout room, gathering items in room for clothing Supervision, shower transfer same manner. Doffing clothing Supervision except needing assist for donning/doffing compression glove. UB/LB bathing with Supervision overall, educated on hemitechniques. Dried off seated, dressing with gown only as pt is done with therapy and wanting to lay down. Ambulated bathroom > sink for deodorant > back to bed level. Alarm on call bell in reach. Pt  missed 20 mins 2/2 fatigue.   Therapy Documentation Precautions:  Precautions Precautions: Fall Recall of Precautions/Restrictions: Intact Precaution/Restrictions Comments: R hemiparesis, Corerak, lightheadness Restrictions Weight Bearing Restrictions Per Provider Order: No    Therapy/Group: Individual Therapy  Chiquita JAYSON Hopping 08/07/2024, 7:33 AM

## 2024-08-07 NOTE — Plan of Care (Signed)
" °  Problem: Consults Goal: RH STROKE PATIENT EDUCATION Description: See Patient Education module for education specifics  Outcome: Progressing   Problem: RH BOWEL ELIMINATION Goal: RH STG MANAGE BOWEL WITH ASSISTANCE Description: STG Manage Bowel with mod I Assistance. Outcome: Progressing Goal: RH STG MANAGE BOWEL W/MEDICATION W/ASSISTANCE Description: STG Manage Bowel with Medication with mod I Assistance. Outcome: Progressing   Problem: RH BLADDER ELIMINATION Goal: RH STG MANAGE BLADDER WITH ASSISTANCE Description: STG Manage Bladder With toileting Assistance Outcome: Progressing Goal: RH STG MANAGE BLADDER WITH MEDICATION WITH ASSISTANCE Description: STG Manage Bladder With Medication With mod I  Assistance. Outcome: Progressing   Problem: RH SAFETY Goal: RH STG ADHERE TO SAFETY PRECAUTIONS W/ASSISTANCE/DEVICE Description: STG Adhere to Safety Precautions With cues Assistance/Device. Outcome: Progressing   Problem: RH KNOWLEDGE DEFICIT Goal: RH STG INCREASE KNOWLEDGE OF STROKE PROPHYLAXIS Description: Patient and dtr will be able to manage secondary risks using educational resources for medications and dietary modification independently Outcome: Progressing   "

## 2024-08-08 LAB — CBC WITH DIFFERENTIAL/PLATELET
Abs Immature Granulocytes: 0.05 K/uL (ref 0.00–0.07)
Basophils Absolute: 0.1 K/uL (ref 0.0–0.1)
Basophils Relative: 1 %
Eosinophils Absolute: 0.4 K/uL (ref 0.0–0.5)
Eosinophils Relative: 5 %
HCT: 33.7 % — ABNORMAL LOW (ref 36.0–46.0)
Hemoglobin: 11.2 g/dL — ABNORMAL LOW (ref 12.0–15.0)
Immature Granulocytes: 1 %
Lymphocytes Relative: 28 %
Lymphs Abs: 2.3 K/uL (ref 0.7–4.0)
MCH: 32.3 pg (ref 26.0–34.0)
MCHC: 33.2 g/dL (ref 30.0–36.0)
MCV: 97.1 fL (ref 80.0–100.0)
Monocytes Absolute: 0.8 K/uL (ref 0.1–1.0)
Monocytes Relative: 10 %
Neutro Abs: 4.6 K/uL (ref 1.7–7.7)
Neutrophils Relative %: 55 %
Platelets: 421 K/uL — ABNORMAL HIGH (ref 150–400)
RBC: 3.47 MIL/uL — ABNORMAL LOW (ref 3.87–5.11)
RDW: 12.1 % (ref 11.5–15.5)
WBC: 8.2 K/uL (ref 4.0–10.5)
nRBC: 0 % (ref 0.0–0.2)

## 2024-08-08 LAB — BASIC METABOLIC PANEL WITH GFR
Anion gap: 8 (ref 5–15)
BUN: 16 mg/dL (ref 8–23)
CO2: 26 mmol/L (ref 22–32)
Calcium: 9.5 mg/dL (ref 8.9–10.3)
Chloride: 101 mmol/L (ref 98–111)
Creatinine, Ser: 0.79 mg/dL (ref 0.44–1.00)
GFR, Estimated: >60 mL/min
Glucose, Bld: 122 mg/dL — ABNORMAL HIGH (ref 70–99)
Potassium: 4.3 mmol/L (ref 3.5–5.1)
Sodium: 135 mmol/L (ref 135–145)

## 2024-08-08 LAB — GLUCOSE, CAPILLARY
Glucose-Capillary: 126 mg/dL — ABNORMAL HIGH (ref 70–99)
Glucose-Capillary: 214 mg/dL — ABNORMAL HIGH (ref 70–99)
Glucose-Capillary: 175 mg/dL — ABNORMAL HIGH (ref 70–99)
Glucose-Capillary: 95 mg/dL (ref 70–99)

## 2024-08-08 MED ORDER — ASPIRIN 81 MG PO CHEW
81.0000 mg | CHEWABLE_TABLET | Freq: Every day | ORAL | Status: DC
  Administered 2024-08-09 – 2024-08-10 (×2): 81 mg via ORAL
  Filled 2024-08-08 (×2): qty 1

## 2024-08-08 MED ORDER — ROSUVASTATIN CALCIUM 20 MG PO TABS
40.0000 mg | ORAL_TABLET | Freq: Every day | ORAL | Status: DC
  Administered 2024-08-09 – 2024-08-10 (×2): 40 mg via ORAL
  Filled 2024-08-08 (×2): qty 2

## 2024-08-08 MED ORDER — DOCUSATE SODIUM 50 MG/5ML PO LIQD: 100.0000 mg | Freq: Two times a day (BID) | ORAL | Status: DC | PRN

## 2024-08-08 MED ORDER — METFORMIN HCL 500 MG PO TABS
1000.0000 mg | ORAL_TABLET | Freq: Two times a day (BID) | ORAL | Status: DC
  Administered 2024-08-08 – 2024-08-10 (×4): 1000 mg via ORAL
  Filled 2024-08-08 (×4): qty 2

## 2024-08-08 MED ORDER — ACETAMINOPHEN 325 MG PO TABS
650.0000 mg | ORAL_TABLET | Freq: Four times a day (QID) | ORAL | Status: DC | PRN
  Administered 2024-08-09 – 2024-08-10 (×2): 650 mg via ORAL
  Filled 2024-08-08: qty 2

## 2024-08-08 MED ORDER — LEVETIRACETAM 250 MG PO TABS
500.0000 mg | ORAL_TABLET | Freq: Two times a day (BID) | ORAL | Status: DC
  Administered 2024-08-09 – 2024-08-10 (×3): 500 mg via ORAL
  Filled 2024-08-08 (×3): qty 2

## 2024-08-08 MED ORDER — POLYETHYLENE GLYCOL 3350 17 G PO PACK: 17.0000 g | PACK | Freq: Every day | ORAL | Status: DC | PRN

## 2024-08-08 MED ORDER — AMLODIPINE BESYLATE 10 MG PO TABS
10.0000 mg | ORAL_TABLET | Freq: Every day | ORAL | Status: DC
  Administered 2024-08-09 – 2024-08-10 (×2): 10 mg via ORAL
  Filled 2024-08-08 (×2): qty 1

## 2024-08-08 MED ORDER — PROPRANOLOL HCL ER 60 MG PO CP24
60.0000 mg | ORAL_CAPSULE | Freq: Every day | ORAL | Status: DC
  Administered 2024-08-08 – 2024-08-10 (×3): 60 mg via ORAL
  Filled 2024-08-08 (×3): qty 1

## 2024-08-08 NOTE — Progress Notes (Signed)
 Occupational Therapy Session Note  Patient Details  Name: Carolyn Hunter MRN: [id number] Date of Birth: 02-12-1951  Today's Date: 08/08/2024 OT Individual Time: 0855-1005 OT Individual Time Calculation (min): 70 min    Short Term Goals: Week 1:  OT Short Term Goal 1 (Week 1): Pt will be able to don shirt with supervision. OT Short Term Goal 2 (Week 1): Pt will don pants with min A. OT Short Term Goal 3 (Week 1): Pt will ambulate to toilet with CGA. OT Short Term Goal 4 (Week 1): Pt will be able to hold a cup in R hand and bring it to her mouth.  Skilled Therapeutic Interventions/Progress Updates:  Pt greeted supine in bed, pt agreeable to OT intervention.    Discussed DC on 12/31, pt agreeable.   Transfers/bed mobility/functional mobility:  Pt completed supine>sit with supervision. Pt completed functional ambulation greater than a household distance with no AD and supervision.    Therapeutic activity:  Pt completed standing balance task using Plinko game to facilitate improved RUE coordination and to promote active ROM in affected UE. Pt needed to use LUE as gross assist for affected RUE.   Pt completed Sit>stands on airex with pt instructed to retrieve bean bags from mat and then stand to place bean bags in basketball goal with a focus on endurance, balance and RUE ROM. Pt completed task with CGA.   Pt instructed to Step over hurdles to simulate navigating home thresholds with a focus on dynamic reaching with RUE. once pt  had stepped over the hurdle and was in tandem stance, pt instructed to reach with RUE to remove horseshoes from goal. Pt completed task with CGA but no LOB.   Pt completed Alternating step ups holding ball to facilitate improved global endurance and to challenge dynamic balance x10 reps, CGA Alternating steps ups holding on to ball at shoulder height to further challenge RUE AROM. Pt completed task with CGA with no LOB    IADLS:pt completed functional ambulation  in kitchen with a focus on  reaching OH and below knee level to retrieve needed items. Pt needs to use BUEs to reach for items d/t RUE weakness and pain. Discussed compensatory strategies for kitchen mobility such as sliding pots/pans on counter vs trying to lift items. Pt reports her daughter will do most of the cooking initially.   Education:  Pts daughter and husband arrived at the end of the session. Provided brief family ed with formal session scheduled for tomorrow. Discussed supervision level and DME recs.    Exercises:  Pt instructed to roll ball forward onto vertical bench to promote active assist ROM to affected RUE. Pt completed x20 reps                 Ended session with pt seated in w/c with all needs within reach and nurse present.   Therapy Documentation Precautions:  Precautions Precautions: Fall Recall of Precautions/Restrictions: Intact Precaution/Restrictions Comments: R hemiparesis, Corerak, lightheadness Restrictions Weight Bearing Restrictions Per Provider Order: No  Pain: 9/10 pain reported in RUE, rest breaks provided as needed.     Therapy/Group: Individual Therapy  Ronal Mallie Needy 08/08/2024, 12:15 PM

## 2024-08-08 NOTE — Progress Notes (Signed)
 Physical Therapy Session Note  Patient Details  Name: Carolyn Hunter MRN: [id number] Date of Birth: [birth year]  Today's Date: 08/08/2024 PT Individual Time: 9196-9171 + 8947-8867 PT Individual Time Calculation (min): 25 min + 40 min  Short Term Goals: Week 1:  PT Short Term Goal 1 (Week 1): pt will ambulate 150 feet with LRAD and CGA PT Short Term Goal 2 (Week 1): pt will perform bed to chair transfer with LRAD and CGA PT Short Term Goal 3 (Week 1): pt will navigate curb step with LRAD for home entry and min A or less  Skilled Therapeutic Interventions/Progress Updates:    SESSION 1: Pt presents in room in bed, agreeable to PT. Pt reporting 10/10 pain in R hand, premedicated. Session focused on therapeutic activities to facilitate upright tolerance and independence/safety with self care tasks. Pt overall supervision this morning for upright mobility and positional changes. Pt supervision/approaching modI for bed mobility, supervision for transfers and gait without device. Pt completes bed mobility and transfer, then able to manage looking through bag to select clothing for the day with supervision, able to use RUE minimally secondary to pain but verbalizes wanting to use RUE as much as possible. Pt completes upper body dressing in standing with supervision, completes lower body dressing in sitting (does not require cues for sitting for safety, completes without cueing) with min assist only for managing pants over nonslip socks, otherwise pt likely able to do with supervision as well. Pt completes ambulation to sink to complete oral hygiene with supervision, requires assist for managing toothpaste on toothbrush, provided with cues and education on hemi technique for oral hygiene with pt verbalizing understanding. Pt then ambulates from room to day room ~350' with supervision no device, takes short seated rest break and ambulates back to room ~350' with same assist level. Pt denies any visual changes  since event. Pt returns to sitting then supine modI in bed where she remains semi reclined, all needs within reach, call light in place, and bed alarm activated at end of session.  SESSION 2: Pt presents in room in bed, agreeable to PT. Pt reporting 10/10 pain in R hand, premedicated. Pt demonstrating increased swelling in thumb, followed up with MD and ortho tech on receiving compression glove. Session focused on therapeutic activities with education on how to decrease in pain and swelling in RUE as well as NMR for dynamic standing balance, BLE coordination, single limb stability. Pt completes bed mobility modI, completes transfers with supervision throughout session. Pt ambulates without device to day room and comes to sitting on EOM. Pt completes gait training with agility ladder for dynamic functional balance and BLE coordinatoin including: - forward gait (one foot in each square) - side stepping - backwards step to gait - backwards reciprocal - in and outs Pt then completes NMR with airex pad including step ups x10, step over and back x10 - completed to promote improved single limb stability on compliant surface. Pt then ambulates back to room and returns to back to bed with RUE elevated for swelling management. Pt remains semi reclined with all needs wtihin reach, call light in place, and bed alarm activated at end of session.   Therapy Documentation Precautions:  Precautions Precautions: Fall Recall of Precautions/Restrictions: Intact Precaution/Restrictions Comments: R hemiparesis, Corerak, lightheadness Restrictions Weight Bearing Restrictions Per Provider Order: No   Therapy/Group: Individual Therapy  Reche Ohara PT, DPT 08/08/2024, 8:29 AM

## 2024-08-08 NOTE — Progress Notes (Signed)
 Speech Language Pathology Daily Session Note  Patient Details  Name: Carolyn Hunter MRN: [id number] Date of Birth: 1951-02-19  Today's Date: 08/08/2024 SLP Individual Time: 1400-1500 SLP Individual Time Calculation (min): 60 min  Short Term Goals: Week 1: SLP Short Term Goal 1 (Week 1): Patient will recall cognitive and physical deficits with min verbal A SLP Short Term Goal 2 (Week 1): Patient will recall daily activities with min multimodal A SLP Short Term Goal 3 (Week 1): Patient will demonstrate problem solving skills in functional situations given min multimodal A  Skilled Therapeutic Interventions: SLP conducted skilled therapy session targeting cognitive goals. SLP facilitated mildly complex decoding task where patient benefited from supervision for task organization, sequencing, and processing speed. Patient then completed detailed pattern replication task where she again benefited from just supervision assist for processing speed and thoroughness. Patient was left in room with call bell in reach and alarm set. SLP will continue to target goals per plan of care.        Pain Pain Assessment Pain Scale: 0-10 Pain Score: 10-Worst pain ever Pain Location: Hand Pain Orientation: Right  Therapy/Group: Individual Therapy  Donnabelle Blanchard, M.A., CCC-SLP  [place] A Karsen Fellows 08/08/2024, 3:02 PM

## 2024-08-08 NOTE — Progress Notes (Signed)
 Patient ID: Carolyn Hunter, female   DOB: 18-Sep-1950, 73 y.o.   MRN: [id number]  This SW covering for primary SW, Becky Dupree.   Medical team reports pt is making gains in rehab and can discharge on 12/31.   0844am- SW spoke with daughter [name] to inform on above. She is amenable to this date.SW informed will confirm if outpatient or HH. No HHA preference and if outpatient prefers [facility]. SW will order TTB wih [facility]. Fam edu tomorrow 9am-12pm with her and her brother.   *SW later followed up confirming outpatient location. She confirms that someone will be able to transport her to/from outpatient therapy.SW discussed discharge process.   TTB ordered with [facility].   SW received call from pt dtr requesting [name] for outpatient instead.   [name], MSW, LCSW Office: [phone] Cell: [phone] Fax: [phone]

## 2024-08-08 NOTE — Progress Notes (Signed)
 Occupational Therapy Discharge Summary  Patient Details  Name: Carolyn Hunter MRN: [id number] Date of Birth: [birth year]  Date of Discharge from OT service:{Time; dates multiple:304500300}  {CHL IP REHAB OT TIME CALCULATIONS:304400400}   Patient has met {NUMBERS 0-12:18577} of {NUMBERS 0-12:18577} long term goals due to {due un:6958348}.  Patient to discharge at overall {LOA:3049010} level.  Patient's care partner {care partner:3041650} to provide the necessary {assistance:3041652} assistance at discharge.    Reasons goals not met: ***  Recommendation:  Patient will benefit from ongoing skilled OT services in {setting:3041680} to continue to advance functional skills in the area of {ADL/iADL:3041649}.  Equipment: {equipment:3041657}  Reasons for discharge: {Reason for discharge:3049018}  Patient/family agrees with progress made and goals achieved: {Pt/Family agree with progress/goals:3049020}  OT Discharge Precautions/Restrictions    General   Vital Signs Therapy Vitals Temp: 97.9 F (36.6 C) Temp Source: Oral Pulse Rate: 83 Resp: 16 BP: (!) 142/69 Patient Position (if appropriate): Lying Oxygen Therapy SpO2: 98 % O2 Device: Room Air Pain Pain Assessment Pain Scale: 0-10 Pain Score: 10-Worst pain ever Pain Location: Hand Pain Orientation: Right ADL ADL Eating: Independent Where Assessed-Eating: Edge of bed Grooming: Supervision/safety Where Assessed-Grooming: Standing at sink Upper Body Bathing: Supervision/safety Where Assessed-Upper Body Bathing: Shower Lower Body Bathing: Supervision/safety Where Assessed-Lower Body Bathing: Shower Upper Body Dressing: Supervision/safety Where Assessed-Upper Body Dressing: Edge of bed Lower Body Dressing: Supervision/safety Where Assessed-Lower Body Dressing: Edge of bed Toileting: Supervision/safety Where Assessed-Toileting: Teacher, Adult Education: Distant supervision Statistician Method: Public House Manager: Acupuncturist: Distant supervision Film/video Editor Method: Designer, Industrial/product: Information systems manager with back, Radio Producer    Mobility     Trunk/Postural Assessment     Balance   Extremity/Trunk Assessment       Ronal Mallie Needy 08/08/2024, 3:31 PM

## 2024-08-08 NOTE — Progress Notes (Signed)
 "                                                        PROGRESS NOTE   Subjective/Complaints:  Still with Right hand pain , gabapentin  dose increased yesterday .  Discussed that increases in pain meds may result in unwanted side effects but that isf pain interferes with sleep or therapy may be worth trying  Labs reviewed   LBM yesterday.   ROS: neg CP, SOB, N/V/D  Objective:   No results found.  Recent Labs    08/08/24 0519  WBC 8.2  HGB 11.2*  HCT 33.7*  PLT 421*    Recent Labs    08/08/24 0519  NA 135  K 4.3  CL 101  CO2 26  GLUCOSE 122*  BUN 16  CREATININE 0.79  CALCIUM  9.5     Intake/Output Summary (Last 24 hours) at 08/08/2024 0742 Last data filed at 08/07/2024 1817 Gross per 24 hour  Intake 660 ml  Output --  Net 660 ml        Physical Exam: Vital Signs Blood pressure 139/74, pulse 100, temperature 97.9 F (36.6 C), temperature source Oral, resp. rate 18, height 5' 2 (1.575 m), weight 67.2 kg, SpO2 94%.    Extremities- RUE esp hand and fingers/thumb, significantly swollen even though using ktape to be wrapped-  as of 12/28- ktape off- and swelling actually looks better- doesn't have compression glove yet  Neuro:  Alert and oriented x 3. Fair insight and awareness. fair Memory. Processing sl delayed. Normal language and speech. Cranial nerve exam unremarkable. MMT: RUE 4- /5 prox to distal. RLE 4/5. LUE and LLE 4+ to 5/5. Sensation decreased to LT in RUE. --no change  Musculoskeletal: RUE perhaps a little less tender to palpation, swollen as above    Assessment/Plan: 1. Functional deficits which require 3+ hours per day of interdisciplinary therapy in a comprehensive inpatient rehab setting. Physiatrist is providing close team supervision and 24 hour management of active medical problems listed below. Physiatrist and rehab team continue to assess barriers to discharge/monitor patient progress toward functional and medical goals  Care  Tool:  Bathing    Body parts bathed by patient: Chest, Abdomen, Front perineal area, Right arm, Face, Left lower leg, Right lower leg, Left upper leg, Right upper leg, Buttocks, Left arm   Body parts bathed by helper: Left arm, Buttocks     Bathing assist Assist Level: Supervision/Verbal cueing     Upper Body Dressing/Undressing Upper body dressing   What is the patient wearing?: Pull over shirt    Upper body assist Assist Level: Minimal Assistance - Patient > 75%    Lower Body Dressing/Undressing Lower body dressing      What is the patient wearing?: Underwear/pull up, Pants     Lower body assist Assist for lower body dressing: Minimal Assistance - Patient > 75%     Toileting Toileting    Toileting assist Assist for toileting: Independent     Transfers Chair/bed transfer  Transfers assist     Chair/bed transfer assist level: Contact Guard/Touching assist     Locomotion Ambulation   Ambulation assist      Assist level: Minimal Assistance - Patient > 75% Assistive device: Hand held assist Max distance: 137   Walk 10 feet activity  Assist     Assist level: Minimal Assistance - Patient > 75% Assistive device: Hand held assist   Walk 50 feet activity   Assist    Assist level: Minimal Assistance - Patient > 75% Assistive device: Hand held assist    Walk 150 feet activity   Assist Walk 150 feet activity did not occur: Safety/medical concerns (fatigue)         Walk 10 feet on uneven surface  activity   Assist     Assist level: Minimal Assistance - Patient > 75% Assistive device: Hand held assist   Wheelchair     Assist Is the patient using a wheelchair?: Yes Type of Wheelchair: Manual    Wheelchair assist level: Dependent - Patient 0%      Wheelchair 50 feet with 2 turns activity    Assist        Assist Level: Dependent - Patient 0%   Wheelchair 150 feet activity     Assist      Assist Level:  Dependent - Patient 0%   Blood pressure 139/74, pulse 100, temperature 97.9 F (36.6 C), temperature source Oral, resp. rate 18, height 5' 2 (1.575 m), weight 67.2 kg, SpO2 94%.  Medical Problem List and Plan: 1. 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 Zio patch at discharge.             -patient may shower             -ELOS/Goals: 10-12 days, PT sup, OT sup to min a, SLP sup, team conf Wednesday - pt hopes to go home this week which should be ok from medical standpoint .            Con't CIR PT, OT and SLP 2.  Antithrombotics: -DVT/anticoagulation:  Mechanical: Sequential compression devices, below knee Bilateral lower extremities Pharmaceutical: d/c Lovenox  ambulating 200-300' with PT              -antiplatelet therapy: Aspirin     3. Pain Management: Tylenol  prn    -12/25 has neuropathic pain RUE--  gabapentin  100mg  BID seems to have helped---increase to TID today   -see #15  12/27- Pt reports gabapentin  and tylenol  slightly helpful but always painful- until gabapentin  kicks in enough, will try Tramadol  prn for severe pain- also had her arm not elevated enough- gave 2nd pillow  12/28- since tramadol  not effective really, will increase gabapentin  to 200 mg TID- won't go higher at this time- if tramadol  not effective, then can d/c, but is taking it some.  Cont to assess efficacy, pt states she can live with pain  at this level , expect gabapentin  effect to increase over the next 1-2 d 4. Mood/Behavior/Sleep: LCSW to follow for evaluation and support when available.              -antipsychotic agents: n/a   5. Neuropsych/cognition: This patient is capable of making decisions on her own behalf.   6. Skin/Wound Care: Routine pressure relief measures.  Bruising noted to the right eye with ecchymosis.  CT head with R lateral periorbital hematoma.    7. Fluids/Electrolytes/Nutrition: Monitor I&O and weight. Follow up labs CBC/CMP    -SLP and  RD consult for Cortrak mgt. Regular diet started off TF, appetite ok this am . 8. 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 Will need Zioptach at discharge.                          -  Continue statin and aspirin .     9. Seizures: EEG 12/17 with epileptogenicity arising from the left central temporoparietal region. EEG 12/18 w/ moderate diffuse encephalopathy.              -Continue Keppra  500 mg BID.    10. Acute hypoxic respiratory failure:Resolved , Cxr concerning for aspiration PNA. Blood cultures 12/15 w/ aerococcus viridans in 1 bottle, suspect contaminant, 12/16 Bcx no growth.              -completed 5 day course of ceftriaxone               -encourage IS, OOB.    11. T2DM: A1c 6.4. CBG this AM 170.             -monitor cbg ac/hs with SSI novolog  and Lantus  12 units daily.    -CBG (last 3)  Recent Labs    08/07/24 1649 08/07/24 2045 08/08/24 0535  GLUCAP 170* 169* 126*     12/29 controlled on current regimen  12. Normocytic anemia: Copious blood noted during ETT exchange and bronchoscopy w/o acute bleed.              -Hgb up to 11.0    13. HLD: LDL 145, Crestor  40 mg.   14.  Diarrhea: Recent laxative antibiotic use.              - two T5 stools yesterday   -receiving bannatrol thru tube    -should improve further as we move from TF's  12/27- TF's/Cortrak off- LBM 12/25 15. R hand swelling: Pt states she was down for 2 d laying on arm, needs retrograde massage , elevation.  CK peaked at [facility] now 321             -Had xray that did not show fracture  -U/S without thrombi  -have discussed massage, elevation of RUE, AROM  -might benefit from K-tape   12/27- hand significantly swollen- ordered 2 compression gloves- 1 with full glove and 1 with partial fingers- since fingers also swollen  12/28- Ktape off- but compression glove not here yet- is propped up on pillow 16.HTN:  Long term goal 130/90             -Home meds  norvasc /propranolol , monitor trend   HR mildly elevated regimen- resume propranolol  ER (home dose 60mg  /d) Vitals:   08/07/24 1956 08/08/24 0445  BP: (!) 147/74 139/74  Pulse: 96 100  Resp: 18 18  Temp: 98.4 F (36.9 C) 97.9 F (36.6 C)  SpO2: 96% 94%      LOS: 6 days A FACE TO FACE EVALUATION WAS PERFORMED  Prentice FORBES Compton 08/08/2024, 7:42 AM     "

## 2024-08-08 NOTE — Discharge Instructions (Addendum)
 Inpatient Rehab Discharge Instructions  Carolyn Hunter  Discharge date and time: 08/10/2024  Activities/Precautions/ Functional Status:  Activity: no lifting, driving, or strenuous exercise for until cleared by provider.  Diet: regular diet  Wound Care: none needed   Functional status:  ___ No restrictions     ___ Walk up steps independently ___ 24/7 supervision/assistance   ___ Walk up steps with assistance _X__ Intermittent supervision/assistance  ___ Bathe/dress independently ___ Walk with walker     ___ Bathe/dress with assistance ___ Walk Independently    ___ Shower independently _X__ Walk with assistance    ___ Shower with assistance __X_ No alcohol      ___ Return to work/school ________  Special Instructions:    My questions have been answered and I understand these instructions. I will adhere to these goals and the provided educational materials after my discharge from the hospital.  Patient/Caregiver Signature _______________________________ Date __________  Clinician Signature _______________________________________ Date __________  Please bring this form and your medication list with you to all your follow-up doctor's appointments.     COMMUNITY REFERRALS UPON DISCHARGE:    Outpatient: PT      OT     ST              Agency: [name]  Phone: [phone]             Appointment Date/Time: *Please expect follow-up within 7-10 business days to schedule your appointment. If you have not received follow-up, be sure to contact the site directly.*   Medical Equipment/Items Ordered:tub transfer bench                                                  Agency/Supplier: [employer] [phone]    STROKE/TIA DISCHARGE INSTRUCTIONS SMOKING Cigarette smoking nearly doubles your risk of having a stroke & is the single most alterable risk factor  If you smoke or have smoked in the last 12 months, you are advised to quit smoking for your health. Most of the  excess cardiovascular risk related to smoking disappears within a year of stopping. Ask you doctor about anti-smoking medications [place] Quit Line: 1-800-QUIT NOW Free Smoking Cessation Classes [phone]  CHOLESTEROL Know your levels; limit fat & cholesterol in your diet  Lipid Panel     Component Value Date/Time   CHOL 234 (H) 07/26/2024 0603   CHOL 207 (H) 07/30/2021 0812   TRIG 167 (H) 07/26/2024 0603   TRIG 170 (H) 07/26/2024 0603   HDL 55 07/26/2024 0603   HDL 57 07/30/2021 0812   CHOLHDL 4.3 07/26/2024 0603   VLDL 34 07/26/2024 0603   LDLCALC 145 (H) 07/26/2024 0603   LDLCALC 129 (H) 07/30/2021 0812   LDLCALC 121 (H) 01/10/2020 0803     Many patients benefit from treatment even if their cholesterol is at goal. Goal: Total Cholesterol (CHOL) less than 160 Goal:  Triglycerides (TRIG) less than 150 Goal:  HDL greater than 40 Goal:  LDL (LDLCALC) less than 100   BLOOD PRESSURE American Stroke Association blood pressure target is less that 120/80 mm/Hg  Your discharge blood pressure is:  BP: 139/74 Monitor your blood pressure Limit your salt and alcohol  intake Many individuals will require more than one medication for high blood pressure  DIABETES (A1c is a blood sugar average for last 3 months) Goal HGBA1c  is under 7% (HBGA1c is blood sugar average for last 3 months)  Diabetes: No known diagnosis of diabetes    Lab Results  Component Value Date   HGBA1C 6.4 (H) 07/25/2024    Your HGBA1c can be lowered with medications, healthy diet, and exercise. Check your blood sugar as directed by your physician Call your physician if you experience unexplained or low blood sugars.  PHYSICAL ACTIVITY/REHABILITATION Goal is 30 minutes at least 4 days per week  Activity: Increase activity slowly,, No driving,, and Walk with assistance, Therapies: Physical Therapy: Outpatient, Occupational Therapy: Outpatient, and Speech Therapy: Outpatient Return to work: N/A Activity decreases your  risk of heart attack and stroke and makes your heart stronger.  It helps control your weight and blood pressure; helps you relax and can improve your mood. Participate in a regular exercise program. Talk with your doctor about the best form of exercise for you (dancing, walking, swimming, cycling).  DIET/WEIGHT Goal is to maintain a healthy weight  Your discharge diet is:  Diet Order             Diet regular Room service appropriate? Yes with Assist; Fluid consistency: Thin  Diet effective now                  Your height is:  Height: 5' 2 (157.5 cm) Your current weight is: Weight: 67.2 kg Your Body Mass Index (BMI) is:  BMI (Calculated): 27.09 Following the type of diet specifically designed for you will help prevent another stroke. Your goal Body Mass Index (BMI) is 19-24. Healthy food habits can help reduce 3 risk factors for stroke:  High cholesterol, hypertension, and excess weight.  RESOURCES Stroke/Support Group:  Call [phone]   STROKE EDUCATION PROVIDED/REVIEWED AND GIVEN TO PATIENT Stroke warning signs and symptoms How to activate emergency medical system (call 911). Medications prescribed at discharge. Need for follow-up after discharge. Personal risk factors for stroke. Pneumonia vaccine given: No Flu vaccine given: No My questions have been answered, the writing is legible, and I understand these instructions.  I will adhere to these goals & educational materials that have been provided to me after my discharge from the hospital.

## 2024-08-09 ENCOUNTER — Other Ambulatory Visit (HOSPITAL_COMMUNITY): Payer: Self-pay

## 2024-08-09 DIAGNOSIS — R569 Unspecified convulsions: Secondary | ICD-10-CM

## 2024-08-09 DIAGNOSIS — M7989 Other specified soft tissue disorders: Secondary | ICD-10-CM | POA: Insufficient documentation

## 2024-08-09 DIAGNOSIS — J9601 Acute respiratory failure with hypoxia: Secondary | ICD-10-CM | POA: Insufficient documentation

## 2024-08-09 DIAGNOSIS — D649 Anemia, unspecified: Secondary | ICD-10-CM | POA: Insufficient documentation

## 2024-08-09 LAB — GLUCOSE, CAPILLARY
Glucose-Capillary: 111 mg/dL — ABNORMAL HIGH (ref 70–99)
Glucose-Capillary: 144 mg/dL — ABNORMAL HIGH (ref 70–99)
Glucose-Capillary: 146 mg/dL — ABNORMAL HIGH (ref 70–99)
Glucose-Capillary: 212 mg/dL — ABNORMAL HIGH (ref 70–99)

## 2024-08-09 MED ORDER — ASCORBIC ACID 1000 MG PO TABS
1000.0000 mg | ORAL_TABLET | Freq: Every day | ORAL | 0 refills | Status: AC
  Filled 2024-08-09: qty 100, 100d supply, fill #0

## 2024-08-09 MED ORDER — GABAPENTIN 300 MG PO CAPS
300.0000 mg | ORAL_CAPSULE | Freq: Three times a day (TID) | ORAL | 0 refills | Status: AC
  Filled 2024-08-09: qty 90, 30d supply, fill #0

## 2024-08-09 MED ORDER — PROCHLORPERAZINE MALEATE 5 MG PO TABS
5.0000 mg | ORAL_TABLET | Freq: Four times a day (QID) | ORAL | 0 refills | Status: AC | PRN
  Filled 2024-08-09: qty 30, 4d supply, fill #0

## 2024-08-09 MED ORDER — ASPIRIN 81 MG PO CHEW
81.0000 mg | CHEWABLE_TABLET | Freq: Every day | ORAL | 0 refills | Status: AC
  Filled 2024-08-09: qty 30, 30d supply, fill #0

## 2024-08-09 MED ORDER — GABAPENTIN 300 MG PO CAPS
300.0000 mg | ORAL_CAPSULE | Freq: Three times a day (TID) | ORAL | Status: DC
  Administered 2024-08-09 – 2024-08-10 (×3): 300 mg via ORAL
  Filled 2024-08-09 (×3): qty 1

## 2024-08-09 MED ORDER — PROPRANOLOL HCL ER 60 MG PO CP24
60.0000 mg | ORAL_CAPSULE | Freq: Every day | ORAL | 0 refills | Status: AC
  Filled 2024-08-09: qty 30, 30d supply, fill #0

## 2024-08-09 MED ORDER — AMLODIPINE BESYLATE 10 MG PO TABS
10.0000 mg | ORAL_TABLET | Freq: Every day | ORAL | 0 refills | Status: AC
  Filled 2024-08-09: qty 30, 30d supply, fill #0

## 2024-08-09 MED ORDER — LEVETIRACETAM 500 MG PO TABS
500.0000 mg | ORAL_TABLET | Freq: Two times a day (BID) | ORAL | 0 refills | Status: AC
  Filled 2024-08-09: qty 60, 30d supply, fill #0

## 2024-08-09 MED ORDER — POLYETHYLENE GLYCOL 3350 17 GM/SCOOP PO POWD
17.0000 g | Freq: Every day | ORAL | 0 refills | Status: AC | PRN
  Filled 2024-08-09: qty 238, 14d supply, fill #0

## 2024-08-09 MED ORDER — ACETAMINOPHEN 325 MG PO TABS: 650.0000 mg | ORAL_TABLET | Freq: Four times a day (QID) | ORAL | Status: AC | PRN

## 2024-08-09 MED ORDER — ENSURE PLUS HIGH PROTEIN PO LIQD: 237.0000 mL | Freq: Two times a day (BID) | ORAL | Status: AC

## 2024-08-09 MED ORDER — ROSUVASTATIN CALCIUM 40 MG PO TABS
40.0000 mg | ORAL_TABLET | Freq: Every day | ORAL | 0 refills | Status: AC
  Filled 2024-08-09: qty 30, 30d supply, fill #0

## 2024-08-09 MED ORDER — METFORMIN HCL 1000 MG PO TABS
1000.0000 mg | ORAL_TABLET | Freq: Two times a day (BID) | ORAL | 0 refills | Status: AC
  Filled 2024-08-09: qty 60, 30d supply, fill #0

## 2024-08-09 NOTE — Progress Notes (Signed)
 Speech Language Pathology Discharge Summary  Patient Details  Name: Carolyn Hunter MRN: [id number] Date of Birth: [birth year]  Date of Discharge from SLP service:August 09, 2024  Today's Date: 08/09/2024 SLP Individual Time: 0800-0856 SLP Individual Time Calculation (min): 56 min   Skilled Therapeutic Interventions:   Skilled therapy session focused on cognitive goals. SLP facilitated completion of medication management task to encourage use of problem solving, memory and organizational skills. SLP provided minA for patient to complete BID pillbox according to written and verbalized directions. Patient with occasional mistakes, though able to self correct independently. SLP educated patient on continuing to utilize pillbox upon d/c to aid in planning, organization, and memory. Patient verbalized understanding. SLP continued to challenge patient through identification of medication mistakes task. Patient required minA to identify errors in BID pillbox and correct according to directions. At the end of the session, patient recalled 4/4 WRAP memory strategies given supervisionA. Patient left in bed with alarm set and call bell in reach. Continue POC.      Patient has met 3 of 3 long term goals.  Patient to discharge at [facility] level.  Reasons goals not met: n/a   Clinical Impression/Discharge Summary:  Pt has made great gains and has met 3 of 3 LTG's this admission due to improved memory, basic problem solving and awareness. Pt is currently an overall minA for cognitive tasks. Pt/family education complete and pt will discharge home with supervision from friends/family/etc. Pt would benefit from  f/u ST services to maximize cognition in order to maximize functional independence.   Care Partner:  Caregiver Able to Provide Assistance: Yes  Type of Caregiver Assistance: Cognitive;Physical  Recommendation:  Home Health SLP;Outpatient SLP  Rationale for SLP Follow Up: Maximize cognitive  function and independence   Equipment: n/a   Reasons for discharge: Treatment goals met;Discharged from hospital   Patient/Family Agrees with Progress Made and Goals Achieved: Yes    Davine Sweney M.A., CCC-SLP 08/09/2024, 8:41 AM

## 2024-08-09 NOTE — Progress Notes (Signed)
 Occupational Therapy Session Note  Patient Details  Name: Carolyn Hunter MRN: [id number] Date of Birth: 06/15/1951  Today's Date: 08/09/2024 OT Individual Time: 9095-8981 OT Individual Time Calculation (min): 74 min    Short Term Goals: Week 1:  OT Short Term Goal 1 (Week 1): Pt will be able to don shirt with supervision. OT Short Term Goal 2 (Week 1): Pt will don pants with min A. OT Short Term Goal 3 (Week 1): Pt will ambulate to toilet with CGA. OT Short Term Goal 4 (Week 1): Pt will be able to hold a cup in R hand and bring it to her mouth.  Skilled Therapeutic Interventions/Progress Updates:  Skilled OT session completed to address ADL retraining, family education, and edema mgmt on RUE. Pt received supine in bed, agreeable to participate in therapy. Pt reports no pain. All functional mobility this session completed ambulatory CGA without HHA.   Pt requested to void, WC>toilet with CGA, pt respectfully declining HHA to complete ambulatory functional mobility. Pt completed toileting hygiene with supervision standing intermittently during task. Pt doffed clothing seated at [facility] with supervision for balance. Toilet>TTB. Pt completed shower level bathing seated at TTB. TTB>toilet. Pt completed UB dressing with set-up A, LB dressing with supervision when standing to pull pants up over bottom. Pt donned socks with increased time d/t difficulty with managing socks with RUE. Pt completed functional mobility to sink for oral hygiene with set-up A to open toothpaste.   Pt and OT collaborated on edema mgmt. Pt declines use of edema glove d/t pain. Pt endorsing elevation at night for edema mgmt. OT provided K-tape at RUE on digits to forearm with 25% stretch to reduce edema and increase functional use of RUE. Pt reporting no pain and increased ease with digit flexion following taping. OT provided addition K-Tape and demo to son for in home to assist with edema mgmt pending OPOT. Pt and son verbalized  understanding. OT demo'd TTB set-up to son, son verbalized and displaying understanding of device. Pt completed functional mobility to ADL apt and demo'd TTB transfer 2x with supervision.   Pt completed functional mobility back to room. OT provided the following exs for RUE to increase functional use and independence with ADLs: - Hand Strengthening 3 Point Pinch With Foam  - 1 x daily - 7 x weekly - 3 sets - 10 reps - Hand Strengthening Key Pinch With Foam  - 1 x daily - 7 x weekly - 3 sets - 10 reps - Hand Strengthening Tip Pinch With Foam  - 1 x daily - 7 x weekly - 3 sets - 10 reps - Gripping Sponge Neutral  - 1 x daily - 7 x weekly - 3 sets - 10 reps - Thumb Opposition  - 1 x daily - 7 x weekly - 3 sets - 10 reps - Seated Edema Reduction Massage  - 1 x daily - 7 x weekly - 3 sets - 10 reps - Wrist AROM Flexion Extension  - 1 x daily - 7 x weekly - 3 sets - 10 reps Pt supine in bed with bed alarm on and all needs within reach.    Therapy Documentation Precautions:  Precautions Precautions: Fall Recall of Precautions/Restrictions: Intact Precaution/Restrictions Comments: R hemiparesis, Corerak, lightheadness Restrictions Weight Bearing Restrictions Per Provider Order: No   Therapy/Group: Individual Therapy  Banesa Tristan Woods-Chance, MS, OTR/L 08/09/2024, 8:00 AM

## 2024-08-09 NOTE — Plan of Care (Signed)
  Problem: RH Balance Goal: LTG: Patient will maintain dynamic sitting balance (OT) Description: LTG:  Patient will maintain dynamic sitting balance with assistance during activities of daily living (OT) Outcome: Completed/Met Goal: LTG Patient will maintain dynamic standing with ADLs (OT) Description: LTG:  Patient will maintain dynamic standing balance with assist during activities of daily living (OT)  Outcome: Completed/Met   Problem: Sit to Stand Goal: LTG:  Patient will perform sit to stand in prep for activites of daily living with assistance level (OT) Description: LTG:  Patient will perform sit to stand in prep for activites of daily living with assistance level (OT) Outcome: Completed/Met   Problem: RH Eating Goal: LTG Patient will perform eating w/assist, cues/equip (OT) Description: LTG: Patient will perform eating with assist, with/without cues using equipment (OT) Outcome: Completed/Met   Problem: RH Grooming Goal: LTG Patient will perform grooming w/assist,cues/equip (OT) Description: LTG: Patient will perform grooming with assist, with/without cues using equipment (OT) Outcome: Completed/Met   Problem: RH Bathing Goal: LTG Patient will bathe all body parts with assist levels (OT) Description: LTG: Patient will bathe all body parts with assist levels (OT) Outcome: Completed/Met   Problem: RH Dressing Goal: LTG Patient will perform upper body dressing (OT) Description: LTG Patient will perform upper body dressing with assist, with/without cues (OT). Outcome: Completed/Met Goal: LTG Patient will perform lower body dressing w/assist (OT) Description: LTG: Patient will perform lower body dressing with assist, with/without cues in positioning using equipment (OT) Outcome: Completed/Met   Problem: RH Toileting Goal: LTG Patient will perform toileting task (3/3 steps) with assistance level (OT) Description: LTG: Patient will perform toileting task (3/3 steps) with  assistance level (OT)  Outcome: Completed/Met   Problem: RH Functional Use of Upper Extremity Goal: LTG Patient will use RT/LT upper extremity as a (OT) Description: LTG: Patient will use right/left upper extremity as a stabilizer/gross assist/diminished/nondominant/dominant level with assist, with/without cues during functional activity (OT) Outcome: Completed/Met   Problem: RH Simple Meal Prep Goal: LTG Patient will perform simple meal prep w/assist (OT) Description: LTG: Patient will perform simple meal prep with assistance, with/without cues (OT). Outcome: Completed/Met   Problem: RH Light Housekeeping Goal: LTG Patient will perform light housekeeping w/assist (OT) Description: LTG: Patient will perform light housekeeping with assistance, with/without cues (OT). Outcome: Completed/Met   Problem: RH Toilet Transfers Goal: LTG Patient will perform toilet transfers w/assist (OT) Description: LTG: Patient will perform toilet transfers with assist, with/without cues using equipment (OT) Outcome: Completed/Met   Problem: RH Tub/Shower Transfers Goal: LTG Patient will perform tub/shower transfers w/assist (OT) Description: LTG: Patient will perform tub/shower transfers with assist, with/without cues using equipment (OT) Outcome: Completed/Met   

## 2024-08-09 NOTE — Progress Notes (Signed)
 Physical Therapy Discharge Summary  Patient Details  Name: Carolyn Hunter MRN: [id number] Date of Birth: 1951-01-25  Date of Discharge from PT service:August 09, 2024  Patient has met 8 of 8 long term goals due to improved activity tolerance, improved balance, improved postural control, increased strength, improved attention, improved awareness, and improved coordination.  Patient to discharge at an ambulatory level Supervision.   Patient's care partner is independent to provide the necessary physical and cognitive assistance at discharge.  Reasons goals not met: N/A  Recommendation:  Patient will benefit from ongoing skilled PT services in outpatient setting to continue to advance safe functional mobility, address ongoing impairments in dynamic standing balance, BLE coordination, gait mechanics, and minimize fall risk.  Equipment: No equipment provided  Reasons for discharge: treatment goals met and discharge from hospital  Patient/family agrees with progress made and goals achieved: Yes  PT Discharge Precautions/Restrictions Precautions Precautions: Fall Restrictions Weight Bearing Restrictions Per Provider Order: No Pain Pain Assessment Pain Scale: 0-10 Pain Score: 9  Pain Type: Acute pain Pain Location: Hand Pain Orientation: Right Pain Descriptors / Indicators: Aching Pain Interference Pain Interference Pain Effect on Sleep: 1. Rarely or not at all Pain Interference with Therapy Activities: 1. Rarely or not at all Pain Interference with Day-to-Day Activities: 3. Frequently Vision/Perception  Vision - History Ability to See in Adequate Light: 0 Adequate Perception Perception: Within Functional Limits Praxis Praxis: WFL  Cognition Overall Cognitive Status: Impaired/Different from baseline Arousal/Alertness: Awake/alert Orientation Level: Oriented X4 Attention: Sustained Focused Attention: Appears intact Sustained Attention: Impaired Memory: Impaired Memory  Impairment: Decreased recall of new information Decreased Short Term Memory: Verbal basic;Functional basic Awareness: Appears intact Awareness Impairment: Intellectual impairment Problem Solving: Impaired Problem Solving Impairment: Verbal complex Safety/Judgment: Appears intact Sensation Sensation Light Touch: Appears Intact Hot/Cold: Appears Intact Proprioception: Appears Intact Additional Comments: Pt endorses pins and needles in R UE Coordination Gross Motor Movements are Fluid and Coordinated: No Fine Motor Movements are Fluid and Coordinated: No Coordination and Movement Description: Has family history of resting tremor. Finger Nose Finger Test: functional on L Motor  Motor Motor: Other (comment) Motor - Skilled Clinical Observations: R hemiplegia UE>LE Motor - Discharge Observations: mild R hemiparesis UE>LE and has improved since evaluation  Mobility Bed Mobility Bed Mobility: Rolling Right;Rolling Left;Supine to Sit;Sit to Supine Rolling Right: Independent Rolling Left: Independent Supine to Sit: Independent Sit to Supine: Independent Transfers Transfers: Sit to Stand;Stand to Sit;Stand Pivot Transfers Sit to Stand: Independent Stand to Sit: Independent Stand Pivot Transfers: Supervision/Verbal cueing Transfer (Assistive device): None Locomotion  Gait Ambulation: Yes Gait Assistance: Supervision/Verbal cueing Gait Distance (Feet): 150 Feet Assistive device: None Gait Gait: Yes Gait Pattern: Within Functional Limits Gait Pattern: Step-through pattern Gait velocity: decreased Stairs / Additional Locomotion Stairs: Yes Stairs Assistance: Supervision/Verbal cueing Stair Management Technique: One rail Left Number of Stairs: 12 Height of Stairs: 6 Wheelchair Mobility Wheelchair Mobility: No  Trunk/Postural Assessment  Cervical Assessment Cervical Assessment: Within Functional Limits Thoracic Assessment Thoracic Assessment: Within Functional Limits Lumbar  Assessment Lumbar Assessment: Within Functional Limits Postural Control Postural Control: Within Functional Limits  Balance Balance Balance Assessed: Yes Standardized Balance Assessment Standardized Balance Assessment: Functional Gait Assessment Static Sitting Balance Static Sitting - Level of Assistance: 7: Independent Dynamic Sitting Balance Dynamic Sitting - Level of Assistance: 5: Stand by assistance Static Standing Balance Static Standing - Level of Assistance: 5: Stand by assistance Dynamic Standing Balance Dynamic Standing - Level of Assistance: 5: Stand by assistance Functional Gait  Assessment  Gait assessed : Yes Gait Level Surface: Walks 20 ft in less than 7 sec but greater than 5.5 sec, uses assistive device, slower speed, mild gait deviations, or deviates 6-10 in outside of the 12 in walkway width. Change in Gait Speed: Able to smoothly change walking speed without loss of balance or gait deviation. Deviate no more than 6 in outside of the 12 in walkway width. Gait with Horizontal Head Turns: Performs head turns smoothly with no change in gait. Deviates no more than 6 in outside 12 in walkway width Gait with Vertical Head Turns: Performs head turns with no change in gait. Deviates no more than 6 in outside 12 in walkway width. Gait and Pivot Turn: Pivot turns safely within 3 sec and stops quickly with no loss of balance. Step Over Obstacle: Is able to step over 2 stacked shoe boxes taped together (9 in total height) without changing gait speed. No evidence of imbalance. Gait with Narrow Base of Support: Ambulates 4-7 steps. Gait with Eyes Closed: Walks 20 ft, uses assistive device, slower speed, mild gait deviations, deviates 6-10 in outside 12 in walkway width. Ambulates 20 ft in less than 9 sec but greater than 7 sec. Ambulating Backwards: Walks 20 ft, uses assistive device, slower speed, mild gait deviations, deviates 6-10 in outside 12 in walkway width. Steps: Two feet to  a stair, must use rail. (alternates ascending) Total Score: 23 Extremity Assessment  RUE Assessment Active Range of Motion (AROM) Comments: WFL in shoulder and elbow,   finger flexion to 20% due to edema in hand, able to extend fingers. limited wrist movement due to edema. General Strength Comments: shoulder flexion 4-/5, not tested distally d/t edema LUE Assessment LUE Assessment: Within Functional Limits RLE Assessment RLE Assessment: Within Functional Limits LLE Assessment LLE Assessment: Within Functional Limits   Dominic Sandoval PTA Reche Ohara PT, DPT  08/09/2024, 2:18 PM

## 2024-08-09 NOTE — Progress Notes (Signed)
 Physical Therapy Session Note  Patient Details  Name: Carolyn Hunter MRN: [id number] Date of Birth: 1951-05-06  Today's Date: 08/09/2024 PT Individual Time: 8694-8584 PT Individual Time Calculation (min): 70 min   Short Term Goals: Week 1:  PT Short Term Goal 1 (Week 1): pt will ambulate 150 feet with LRAD and CGA PT Short Term Goal 2 (Week 1): pt will perform bed to chair transfer with LRAD and CGA PT Short Term Goal 3 (Week 1): pt will navigate curb step with LRAD for home entry and min A or less  Skilled Therapeutic Interventions/Progress Updates: Patient in bathroom with nsg and family present on entrance to room. Patient alert and agreeable to PT session.   Patient reported no pain. Today was pt's grad day. See d/c for summary. Pt discussed with pt and pt son about pt's HEP, and to ensure pt is supervised due to balance aspect and increase risk of fall.   Patient demonstrates increased fall risk as noted by score of 23/30 on  Functional Gait Assessment.   <22/30 = predictive of falls, <20/30 = fall in 6 months, <18/30 = predictive of falls in PD MCID: 5 points stroke population, 4 points geriatric population (ANPTA Core Set of Outcome Measures for Adults with Neurologic Conditions, 2018)  Access Code: [email or web address] URL: [email or web address] Date: 08/09/2024 Prepared by: Delinda Bertrand  Exercises - Single Leg Stance with Support  - 1 x daily - 7 x weekly - 3 sets - 15-30 second hold - Standing Tandem Balance with Counter Support  - 1 x daily - 7 x weekly - 3 sets - 15-30 seconds hold - Sit to Stand  - 1 x daily - 7 x weekly - 3 sets - 10 reps - Standing Marching  - 1 x daily - 7 x weekly - 3 sets - 10 reps   - pt performed above with education on hovering hand over surface (facing) especially when in tandem to avoid LOB to opposing side if counter is on other side.  Pt ambulated throughout session with supervision and no AD and no LOB.  Patient sitting EOB at end  of session with brakes locked, family present and all needs within reach.      Therapy Documentation Precautions:  Precautions Precautions: Fall Recall of Precautions/Restrictions: Intact Precaution/Restrictions Comments: R hemiparesis, Corerak, lightheadness Restrictions Weight Bearing Restrictions Per Provider Order: No  Therapy/Group: Individual Therapy  Bridgitte Felicetti PTA 08/09/2024, 3:37 PM

## 2024-08-09 NOTE — Progress Notes (Signed)
 "                                                        PROGRESS NOTE   Subjective/Complaints: Discussed right hand and forearm swelling , has muscle mecrosis with edema related to laying on R arm for 48h, neg xray and vascular US   ROS: neg CP, SOB, N/V/D  Objective:   No results found.  Recent Labs    08/08/24 0519  WBC 8.2  HGB 11.2*  HCT 33.7*  PLT 421*    Recent Labs    08/08/24 0519  NA 135  K 4.3  CL 101  CO2 26  GLUCOSE 122*  BUN 16  CREATININE 0.79  CALCIUM  9.5     Intake/Output Summary (Last 24 hours) at 08/09/2024 0730 Last data filed at 08/08/2024 1830 Gross per 24 hour  Intake 880 ml  Output --  Net 880 ml        Physical Exam: Vital Signs Blood pressure 137/69, pulse 80, temperature 97.9 F (36.6 C), resp. rate 18, height 5' 2 (1.575 m), weight 67.4 kg, SpO2 96%.  General: No acute distress Mood and affect are appropriate Heart: Regular rate and rhythm no rubs murmurs or extra sounds Lungs: Clear to auscultation, breathing unlabored, no rales or wheezes Abdomen: Positive bowel sounds, soft nontender to palpation, nondistended Extremities: No clubbing, cyanosis, or edema Skin: No evidence of breakdown, no evidence of rash  Extremities- RUE esp hand and fingers/thumb, 2+ edema (mildly improved)  Neuro:  Alert and oriented x 3. Fair insight and awareness. fair Memory. Processing sl delayed. Normal language and speech. Cranial nerve exam unremarkable. MMT: RUE 4- /5 prox to distal.  LUE  5/5. Sensation decreased to LT in RUE. -but no peripheral nerve distribution  Musculoskeletal: RUE perhaps a little less tender to palpation, swollen as above , reduced finger flexion due to swelling    Assessment/Plan: 1. Functional deficits which require 3+ hours per day of interdisciplinary therapy in a comprehensive inpatient rehab setting. Physiatrist is providing close team supervision and 24 hour management of active medical problems listed  below. Physiatrist and rehab team continue to assess barriers to discharge/monitor patient progress toward functional and medical goals  Care Tool:  Bathing    Body parts bathed by patient: Chest, Abdomen, Front perineal area, Right arm, Face, Left lower leg, Right lower leg, Left upper leg, Right upper leg, Buttocks, Left arm   Body parts bathed by helper: Left arm, Buttocks     Bathing assist Assist Level: Supervision/Verbal cueing     Upper Body Dressing/Undressing Upper body dressing   What is the patient wearing?: Pull over shirt    Upper body assist Assist Level: Minimal Assistance - Patient > 75%    Lower Body Dressing/Undressing Lower body dressing      What is the patient wearing?: Underwear/pull up, Pants     Lower body assist Assist for lower body dressing: Minimal Assistance - Patient > 75%     Toileting Toileting    Toileting assist Assist for toileting: Independent     Transfers Chair/bed transfer  Transfers assist     Chair/bed transfer assist level: Supervision/Verbal cueing     Locomotion Ambulation   Ambulation assist      Assist level: Minimal Assistance - Patient > 75% Assistive device: Hand  held assist Max distance: 137   Walk 10 feet activity   Assist     Assist level: Minimal Assistance - Patient > 75% Assistive device: Hand held assist   Walk 50 feet activity   Assist    Assist level: Minimal Assistance - Patient > 75% Assistive device: Hand held assist    Walk 150 feet activity   Assist Walk 150 feet activity did not occur: Safety/medical concerns (fatigue)         Walk 10 feet on uneven surface  activity   Assist     Assist level: Minimal Assistance - Patient > 75% Assistive device: Hand held assist   Wheelchair     Assist Is the patient using a wheelchair?: Yes Type of Wheelchair: Manual    Wheelchair assist level: Dependent - Patient 0%      Wheelchair 50 feet with 2 turns  activity    Assist        Assist Level: Dependent - Patient 0%   Wheelchair 150 feet activity     Assist      Assist Level: Dependent - Patient 0%   Blood pressure 137/69, pulse 80, temperature 97.9 F (36.6 C), resp. rate 18, height 5' 2 (1.575 m), weight 67.4 kg, SpO2 96%.  Medical Problem List and Plan: 1. 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 Zio patch at discharge.             -patient may shower             -ELOS/Goals: plan d/c in am , outpatient PT, OT with PMR f/u 2.  Antithrombotics: -DVT/anticoagulation:  Mechanical: Sequential compression devices, below knee Bilateral lower extremities Pharmaceutical: d/c Lovenox  ambulating 200-300' with PT              -antiplatelet therapy: Aspirin     3. Pain Management: Tylenol  prn      12/30- since tramadol  not effective really, will d/c Increase gabapentin  to 300mg  TID 4. Mood/Behavior/Sleep: LCSW to follow for evaluation and support when available.              -antipsychotic agents: n/a   5. Neuropsych/cognition: This patient is capable of making decisions on her own behalf.   6. Skin/Wound Care: Routine pressure relief measures.  Bruising noted to the right eye with ecchymosis.  CT head with R lateral periorbital hematoma.    7. Fluids/Electrolytes/Nutrition: Monitor I&O and weight. Follow up labs CBC/CMP    -SLP and RD consult for Cortrak mgt. Regular diet started off TF, appetite ok this am . 8. 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 Will need Zioptach at discharge.                          -Continue statin and aspirin .     9. Seizures: EEG 12/17 with epileptogenicity arising from the left central temporoparietal region. EEG 12/18 w/ moderate diffuse encephalopathy.              -Continue Keppra  500 mg BID.    10. Acute hypoxic respiratory failure:Resolved , Cxr concerning for aspiration PNA.  Blood cultures 12/15 w/ aerococcus viridans in 1 bottle, suspect contaminant, 12/16 Bcx no growth.              -completed 5 day course of ceftriaxone               -encourage IS, OOB.  11. T2DM: A1c 6.4. CBG this AM 170.             -monitor cbg ac/hs with SSI novolog  and Lantus  12 units daily.    -CBG (last 3)  Recent Labs    08/08/24 1656 08/08/24 2026 08/09/24 0547  GLUCAP 175* 95 111*     12/30 controlled on current regimen  12. Normocytic anemia: Copious blood noted during ETT exchange and bronchoscopy w/o acute bleed.              -Hgb up to 11.0    13. HLD: LDL 145, Crestor  40 mg.   14.  Diarrhea: Recent laxative antibiotic use.              - two T5 stools yesterday   -receiving bannatrol thru tube    -should improve further as we move from TF's  12/27- TF's/Cortrak off- LBM 12/25 15. R hand swelling: Pt states she was down for 2 d laying on arm, needs retrograde massage , elevation.  CK peaked at [facility] now 321             -Had xray that did not show fracture  -U/S without thrombi  -have discussed massage, elevation of RUE, AROM  -might benefit from K-tape   12/27- hand significantly swollen- ordered 2 compression gloves- 1 with full glove and 1 with partial fingers- since fingers also swollen  12/28- Ktape off- but compression glove not here yet- is propped up on pillow 16.HTN:  Long term goal 130/90             -Home meds norvasc /propranolol , monitor trend   HR mildly elevated regimen- resume propranolol  ER (home dose 60mg  /d) Vitals:   08/08/24 2025 08/09/24 0547  BP: (!) 148/67 137/69  Pulse: 84 80  Resp: 18 18  Temp: 98 F (36.7 C) 97.9 F (36.6 C)  SpO2: 97% 96%   HR improved   LOS: 7 days A FACE TO FACE EVALUATION WAS PERFORMED  Prentice BRAVO Swayzie Choate 08/09/2024, 7:30 AM     "

## 2024-08-09 NOTE — Plan of Care (Signed)
  Problem: RH Problem Solving Goal: LTG Patient will demonstrate problem solving for (SLP) Description: LTG:  Patient will demonstrate problem solving for basic/complex daily situations with cues  (SLP) Outcome: Completed/Met   Problem: RH Memory Goal: LTG Patient will demonstrate ability for day to day (SLP) Description: LTG:   Patient will demonstrate ability for day to day recall/carryover during cognitive/linguistic activities with assist  (SLP) Outcome: Completed/Met   Problem: RH Awareness Goal: LTG: Patient will demonstrate awareness during functional activites type of (SLP) Description: LTG: Patient will demonstrate awareness during functional activites type of (SLP) Outcome: Completed/Met   

## 2024-08-09 NOTE — Progress Notes (Incomplete)
 Inpatient Rehabilitation Discharge Medication Review by a Pharmacist  A complete drug regimen review was completed for this patient to identify any potential clinically significant medication issues.  High Risk Drug Classes Is patient taking? Indication by Medication  Antipsychotic No   Anticoagulant No   Antibiotic No   Opioid No   Antiplatelet Yes Aspirin : CVA ppx  Hypoglycemics/insulin  Yes Metformin , Lantus : DM  Vasoactive Medication Yes Amlodipine , propranolol : HTN  Chemotherapy No   Other Yes VitC, Zinc : Wound care Keppra : seizures Crestor : CVA ppx Gabapentin : pain      Type of Medication Issue Identified Description of Issue Recommendation(s)  Drug Interaction(s) (clinically significant)     Duplicate Therapy     Allergy     No Medication Administration End Date     Incorrect Dose     Additional Drug Therapy Needed     Significant med changes from prior encounter (inform family/care partners about these prior to discharge).  Restart or discontinue as appropriate. Communicate medication changes with patient/family at discharge  Other       Clinically significant medication issues were identified that warrant physician communication and completion of prescribed/recommended actions by midnight of the next day:  {Yes or No?:26198}  Name of provider notified for urgent issues identified: ***   Provider Method of Notification: ***    Pharmacist comments: ***   Time spent performing this drug regimen review (minutes): 30   Thank you for allowing pharmacy to be a part of this patients care.   Bascom JAYSON Louder, PharmD 08/09/2024 10:37 AM     **Pharmacist phone directory can be found on [email or web address] listed under [facility]**

## 2024-08-09 NOTE — Discharge Summary (Signed)
 Physician Discharge Summary  Patient ID: ESTEFANNY MOLER MRN: [id number] DOB/AGE: [birth year] 71 y.o.  Admit date: 08/02/2024 Discharge date: 08/09/2024  Discharge Diagnoses:  Principal Problem:   Acute ischemic left MCA stroke [facility]) Active Problems:   Controlled type 2 diabetes mellitus without complication, without long-term current use of insulin  (HCC)   HLD (hyperlipidemia)   Essential hypertension   CVA (cerebral vascular accident) (HCC)   Normocytic anemia   Swelling of arm   Seizures (HCC)   Acute hypoxic respiratory failure (HCC)   Discharged Condition: stable  Significant Diagnostic Studies: VAS US  UPPER EXTREMITY VENOUS DUPLEX Result Date: 08/03/2024 UPPER VENOUS STUDY  Patient Name:  ANAIKA SANTILLANO  Date of Exam:   08/03/2024 Medical Rec #: [id number]       Accession #:    [id number] Date of Birth: 16-Aug-1950       Patient Gender: F Patient Age:   73 years Exam Location:  [facility] Procedure:      VAS US  UPPER EXTREMITY VENOUS DUPLEX Referring Phys: MURRAY COLLIER --------------------------------------------------------------------------------  Indications: Swelling Risk Factors: None identified. Limitations: Bandages. Comparison Study: No prior studies. Performing Technologist: Cordella Collet RVT  Examination Guidelines: A complete evaluation includes B-mode imaging, spectral Doppler, color Doppler, and power Doppler as needed of all accessible portions of each vessel. Bilateral testing is considered an integral part of a complete examination. Limited examinations for reoccurring indications may be performed as noted.  Right Findings: +----------+------------+---------+-----------+----------+-------+ RIGHT     CompressiblePhasicitySpontaneousPropertiesSummary +----------+------------+---------+-----------+----------+-------+ IJV           Full       Yes       Yes                      +----------+------------+---------+-----------+----------+-------+  Subclavian    Full       Yes       Yes                      +----------+------------+---------+-----------+----------+-------+ Axillary      Full       Yes       Yes                      +----------+------------+---------+-----------+----------+-------+ Brachial      Full                                          +----------+------------+---------+-----------+----------+-------+ Radial        Full                                          +----------+------------+---------+-----------+----------+-------+ Ulnar         Full                                          +----------+------------+---------+-----------+----------+-------+ Cephalic      Full                                          +----------+------------+---------+-----------+----------+-------+ Basilic       Full                                          +----------+------------+---------+-----------+----------+-------+  Left Findings: +----------+------------+---------+-----------+----------+-------+ LEFT      CompressiblePhasicitySpontaneousPropertiesSummary +----------+------------+---------+-----------+----------+-------+ Subclavian               Yes       Yes                      +----------+------------+---------+-----------+----------+-------+  Summary:  Right: No evidence of deep vein thrombosis in the upper extremity. No evidence of superficial vein thrombosis in the upper extremity.  Left: No evidence of thrombosis in the subclavian.  *See table(s) above for measurements and observations.  Diagnosing physician: Debby Robertson Electronically signed by Debby Robertson on 08/03/2024 at 11:32:46 AM.    Final    DG Abd Portable 1V Result Date: 07/29/2024 EXAM: XR ABDOMEN INTRAOPERATIVE COUNT 1 VIEW(S) XRAY OF THE ABDOMEN 07/29/2024 02:55:52 PM COMPARISON: 07/28/2024 CLINICAL HISTORY: Encounter for feeding tube placement [id number] FINDINGS: No unexpected retained surgical items. Enteric tube in  place with distal tip terminating within the expected location of the gastric antrum. Small right pleural effusion and bibasilar atelectasis. IMPRESSION: 1. Enteric tube terminates within the gastric antrum. 2. Small right pleural effusion and bibasilar atelectasis. Electronically signed by: Greig Pique MD 07/29/2024 04:02 PM EST RP Workstation: [id number]   DG Abd 1 View Result Date: 07/29/2024 EXAM: 1 VIEW XRAY OF THE ABDOMEN 07/28/2024 11:15:00 PM COMPARISON: 07/25/2024 CLINICAL HISTORY: Encounter for nasogastric (NG) tube placement FINDINGS: LINES, TUBES AND DEVICES: Enteric tube in place with tip and side hole overlying the left upper quadrant, gastric body. BOWEL: Nonobstructive bowel gas pattern. SOFT TISSUES: No abnormal calcifications. BONES: No acute fracture. IMPRESSION: 1. Enteric tube in place with tip and side hole overlying the gastric body. Electronically signed by: Oneil Devonshire MD 07/29/2024 01:04 AM EST RP Workstation: [id number]   DG CHEST PORT 1 VIEW Result Date: 07/28/2024 EXAM: 1 VIEW(S) XRAY OF THE CHEST 07/28/2024 11:13:00 PM COMPARISON: chest x-ray 07/25/2024 5 CLINICAL HISTORY: Encounter for imaging study to confirm orogastric (OG) tube placement. FINDINGS: LINES, TUBES AND DEVICES: The endotracheal tube tip is at the level of the carina. The enteric tube extends below the diaphragm, but the distal tip is not included on the image. LUNGS AND PLEURA: There is platelike atelectasis in the right lower lobe. There is likely a small right pleural effusion which is new. No pneumothorax. HEART AND MEDIASTINUM: The cardiomediastinal silhouette is stable and within normal limits. BONES AND SOFT TISSUES: No acute osseous abnormality. IMPRESSION: 1. Endotracheal tube tip at the level of the carina; consider retracting. 2. Enteric tube extends below the diaphragm, but the distal tip is not included on the image. 3. Likely small right pleural effusion, new. 4. Platelike atelectasis in the right  lower lobe. Electronically signed by: Greig Pique MD 07/28/2024 11:18 PM EST RP Workstation: [id number]   Overnight EEG with video Result Date: 07/27/2024 Shelton Arlin KIDD, MD     07/28/2024  5:10 AM Patient Name: NEJLA REASOR MRN: [id number] Epilepsy Attending: Arlin KIDD Shelton Referring Physician/Provider: Zaida Zola SAILOR, MD Duration: 07/26/2024 1536 to 07/27/2024 1536 Patient history: 73 y.o. female found down with right-sided weakness. EEG to evaluate for seizure Level of alertness: Awake, asleep AEDs during EEG study: None Technical aspects: This EEG study was done with scalp electrodes positioned according to the 10-20 International system of electrode placement. Electrical activity was reviewed with band pass filter of 1-70Hz , sensitivity of 7 uV/mm, display speed of 74mm/sec with a 60Hz  notched filter applied as appropriate. EEG data were recorded continuously and digitally stored.  Video monitoring was available and reviewed as appropriate. Description: The posterior dominant rhythm consists of 8 Hz activity of moderate voltage (25-35 uV) seen predominantly in posterior head regions, symmetric and reactive to eye opening and eye closing. Sleep was characterized by vertex waves, sleep spindles (12 to 14 Hz), maximal frontocentral region. There is continuous generalized and lateralized left hemisphere 3 to 6 Hz theta-delta slowing. Sharp waves were noted in left centro-temporo-parietal region, predominantly during sleep. Hyperventilation and photic stimulation were not performed.   ABNORMALITY - Sharp wave, left centro-temporo-parietal region - Continuous slow, generalized and lateralized left hemisphere IMPRESSION: This study showed evidence of epileptogenicity arising from left centro-temporo-parietal region. Additionally there is cortical dysfunction arising from left hemisphere likely secondary to underlying structural abnormality. Lastly there is moderate diffuse encephalopathy. No seizures were  seen throughout the recording. Arlin MALVA Krebs   ECHOCARDIOGRAM COMPLETE Result Date: 07/26/2024    ECHOCARDIOGRAM REPORT   Patient Name:   MAYERLI KIRST Date of Exam: 07/26/2024 Medical Rec #:  [id number]      Height:       63.0 in Accession #:    [id number]     Weight:       158.3 lb Date of Birth:  August 29, 1950      BSA:          1.751 m Patient Age:    73 years       BP:           100/50 mmHg Patient Gender: F              HR:           88 bpm. Exam Location:  Inpatient Procedure: 2D Echo, Cardiac Doppler, Color Doppler and Intracardiac            Opacification Agent (Both Spectral and Color Flow Doppler were            utilized during procedure). Indications:    Stroke  History:        Patient has no prior history of Echocardiogram examinations.                 Arrythmias:Tachycardia; Risk Factors:Dyslipidemia, Hypertension                 and Diabetes.  Sonographer:    Sherlean Dubin Referring Phys: [phone] MCNEILL P KIRKPATRICK  Sonographer Comments: Echo performed with patient supine and on artificial respirator. Image acquisition challenging due to respiratory motion. IMPRESSIONS  1. Left ventricular ejection fraction, by estimation, is 60 to 65%. The left ventricle has normal function. The left ventricle has no regional wall motion abnormalities. Left ventricular diastolic parameters were normal.  2. Right ventricular systolic function is normal. The right ventricular size is normal.  3. The mitral valve is normal in structure. No evidence of mitral valve regurgitation. No evidence of mitral stenosis.  4. The aortic valve is normal in structure. Aortic valve regurgitation is not visualized. No aortic stenosis is present.  5. The inferior vena cava is normal in size with greater than 50% respiratory variability, suggesting right atrial pressure of 3 mmHg. Conclusion(s)/Recommendation(s): No intracardiac source of embolism detected on this transthoracic study. Consider a transesophageal echocardiogram to  exclude cardiac source of embolism if clinically indicated. Images were technically difficult. FINDINGS  Left Ventricle: There is no left ventricular thrombus. Definity  contrast was used. Left ventricular ejection fraction, by estimation, is 60 to 65%. The left ventricle has normal function. The left ventricle has no  regional wall motion abnormalities. Definity  contrast agent was given IV to delineate the left ventricular endocardial borders. The left ventricular internal cavity size was normal in size. There is no left ventricular hypertrophy. Left ventricular diastolic parameters were normal. Right Ventricle: The right ventricular size is normal. No increase in right ventricular wall thickness. Right ventricular systolic function is normal. Left Atrium: Left atrial size was not well visualized. Right Atrium: Right atrial size was not well visualized. Pericardium: There is no evidence of pericardial effusion. Mitral Valve: The mitral valve is normal in structure. No evidence of mitral valve regurgitation. No evidence of mitral valve stenosis. Tricuspid Valve: The tricuspid valve is normal in structure. Tricuspid valve regurgitation is not demonstrated. No evidence of tricuspid stenosis. Aortic Valve: The aortic valve is normal in structure. Aortic valve regurgitation is not visualized. No aortic stenosis is present. Aortic valve mean gradient measures 3.0 mmHg. Aortic valve peak gradient measures 5.2 mmHg. Aortic valve area, by VTI measures 2.68 cm. Pulmonic Valve: The pulmonic valve was normal in structure. Pulmonic valve regurgitation is not visualized. No evidence of pulmonic stenosis. Aorta: The aortic root is normal in size and structure. Venous: The inferior vena cava is normal in size with greater than 50% respiratory variability, suggesting right atrial pressure of 3 mmHg. IAS/Shunts: The interatrial septum was not well visualized.  LEFT VENTRICLE PLAX 2D LVIDd:         3.90 cm   Diastology LVIDs:          2.80 cm   LV e' medial:    4.58 cm/s LV PW:         1.00 cm   LV E/e' medial:  17.3 LV IVS:        1.00 cm   LV e' lateral:   7.58 cm/s LVOT diam:     2.00 cm   LV E/e' lateral: 10.4 LV SV:         51 LV SV Index:   29 LVOT Area:     3.14 cm  RIGHT VENTRICLE            IVC RV S prime:     6.87 cm/s  IVC diam: 1.30 cm TAPSE (M-mode): 1.1 cm LEFT ATRIUM             Index        RIGHT ATRIUM           Index LA diam:        3.20 cm 1.83 cm/m   RA Area:     11.10 cm LA Vol (A2C):   53.8 ml 30.73 ml/m  RA Volume:   21.60 ml  12.34 ml/m LA Vol (A4C):   24.3 ml 13.88 ml/m LA Biplane Vol: 39.4 ml 22.51 ml/m  AORTIC VALVE AV Area (Vmax):    2.73 cm AV Area (Vmean):   2.62 cm AV Area (VTI):     2.68 cm AV Vmax:           114.50 cm/s AV Vmean:          75.450 cm/s AV VTI:            0.189 m AV Peak Grad:      5.2 mmHg AV Mean Grad:      3.0 mmHg LVOT Vmax:         99.50 cm/s LVOT Vmean:        62.850 cm/s LVOT VTI:          0.161 m LVOT/AV  VTI ratio: 0.85  AORTA Ao Root diam: 2.60 cm MITRAL VALVE MV Area (PHT): 4.06 cm    SHUNTS MV Decel Time: 187 msec    Systemic VTI:  0.16 m MV E velocity: 79.10 cm/s  Systemic Diam: 2.00 cm MV A velocity: 76.50 cm/s MV E/A ratio:  1.03 Mihai Croitoru MD Electronically signed by Jerel Balding MD Signature Date/Time: 07/26/2024/3:56:36 PM    Final    EEG adult Result Date: 07/26/2024 Shelton Arlin KIDD, MD     07/26/2024 10:33 AM Patient Name: SHERREE SHANKMAN MRN: [id number] Epilepsy Attending: Arlin KIDD Shelton Referring Physician/Provider: Michaela Aisha SQUIBB, MD Date: 07/26/2024 Duration: 22.39 mins Patient history: 72yo F with ams. EEG to evaluate for seizure Level of alertness: comatose AEDs during EEG study: LEV, Propofol  Technical aspects: This EEG study was done with scalp electrodes positioned according to the 10-20 International system of electrode placement. Electrical activity was reviewed with band pass filter of 1-70Hz , sensitivity of 7 uV/mm, display speed of  41mm/sec with a 60Hz  notched filter applied as appropriate. EEG data were recorded continuously and digitally stored.  Video monitoring was available and reviewed as appropriate. Description: EEG showed continuous generalized 3 to 6 Hz theta-delta slowing. Hyperventilation and photic stimulation were not performed.   ABNORMALITY - Continuous slow, generalized IMPRESSION: This study is suggestive of generalized cerebral dysfunction (encephalopathy). No seizures or epileptiform discharges were seen throughout the recording. Arlin KIDD Shelton   MR CERVICAL SPINE WO CONTRAST Result Date: 07/25/2024 EXAM: MRI CERVICAL SPINE WITHOUT CONTRAST 07/25/2024 10:04:14 PM TECHNIQUE: Multiplanar multisequence MRI of the cervical spine was performed. COMPARISON: Comparison made with prior CT from earlier the same day. CLINICAL HISTORY: Myelopathy, acute, cervical spine. FINDINGS: BONES AND ALIGNMENT: Straightening with slight reversal of the normal cervical lordosis. No significant listhesis. Vertebral body height maintained without acute or chronic fracture. Bone marrow signal intensity within normal limits. No discrete or worrisome osseous lesions. No abnormal marrow edema. SPINAL CORD: Normal signal morphology seen within the cervical spinal cord. SOFT TISSUES: No paraspinal mass. Endotracheal and enteric tubes in place. VASCULATURE: Loss of normal flow void within the left vertebral artery, likely reflecting slow flow related to proximal stenosis as seen on prior CTA. C2-C3: Normal interspace. Mild bilateral facet spurring. No stenosis. C3-C4: Small central disc protrusion mildly indents ventral thecal sac. Mild left-sided facet hypertrophy. No spinal stenosis. Foramina remain patent. C4-C5: Mild disc bulge with uncovertebral spurring. Mild left-sided facet hypertrophy. No significant spinal stenosis. Foramina remain patent. C5-C6: Advanced degenerative changes and vertebral disc space narrowing. Left paracentral disc  osteophyte complex flattens and indents the left ventral thecal sac. Mild cord flattening without cord signal changes. Moderate spinal stenosis. Uncovertebral spurring without significant foraminal encroachment. C6-C7: Small right paracentral disc protrusion mildly indents ventral thecal sac. No significant spinal stenosis. Foramina remain patent. C7-T1: Small left paracentral disc protrusion mildly flattens the ventral thecal sac. Mild left-sided facet hypertrophy. No significant spinal stenosis. Foramina remain patent. IMPRESSION: 1. Normal MRI appearance of the cervical spinal cord. 2. Cervical spondylosis at C5-C6 with resultant moderate spinal stenosis. 3. Loss of normal flow void within the left vertebral artery, presumably reflecting slow flow related to proximal stenosis as seen on prior CTA. Electronically signed by: Morene Hoard MD 07/25/2024 10:48 PM EST RP Workstation: [id number]   MR BRAIN WO CONTRAST Result Date: 07/25/2024 EXAM: MRI BRAIN WITHOUT CONTRAST 07/25/2024 10:04:14 PM TECHNIQUE: Multiplanar multisequence MRI of the head/brain was performed without the administration of intravenous contrast. COMPARISON: CT from earlier  the same day. CLINICAL HISTORY: Delirium. FINDINGS: BRAIN AND VENTRICLES: Patchy T2/FLAIR hyperintensity involving the periventricular and deep white matter, consistent with chronic small vessel ischemic disease, moderate in nature. Few small patchy foci of restricted diffusion seen involving the posterior left parietal region, consistent with small acute ischemic infarcts (series 9, images 82, 83, 79). No associated acute intracranial hemorrhage or mass effect. Multiple scattered chronic microhemorrhages noted, favored to be hypertensive in nature. No mass. No midline shift. No hydrocephalus. The sella is unremarkable. Normal flow voids. ORBITS: Prior bilateral ocular lens replacement. SINUSES AND MASTOIDS: Scattered mucosal thickening is present about the paranasal  sinuses. BONES AND SOFT TISSUES: Normal marrow signal. Diffuse soft tissue swelling present about the right frontal scalp/periorbital region, consistent with a contusion. The patient is intubated. IMPRESSION: 1. Small acute ischemic infarcts involving . the posterior left frontoparietal region. No associated hemorrhage or mass effect. 2. Underlying moderate chronic microvascular ischemic disease. 3. Right frontal scalp/periorbital soft tissue contusion. Electronically signed by: Morene Hoard MD 07/25/2024 10:36 PM EST RP Workstation: [id number]   DG Abd Portable 1 View Result Date: 07/25/2024 EXAM: 1 VIEW XRAY OF THE ABDOMEN 07/25/2024 04:45:00 PM COMPARISON: None available. CLINICAL HISTORY: intubation OG tube placement FINDINGS: LINES, TUBES AND DEVICES: Enteric tube in place with tip in the mid stomach. BOWEL: Nonobstructive bowel gas pattern. SOFT TISSUES: Excreted contrast in the renal collecting systems. BONES: No acute fracture. IMPRESSION: 1. Enteric tube with tip in the mid stomach. Electronically signed by: Pinkie Pebbles MD 07/25/2024 05:42 PM EST RP Workstation: [id number]   DG Chest Portable 1 View Result Date: 07/25/2024 EXAM: 1 VIEW(S) XRAY OF THE CHEST 07/25/2024 04:45:00 PM COMPARISON: 07/25/2024 CLINICAL HISTORY: intubation FINDINGS: LINES, TUBES AND DEVICES: Endotracheal tube in place with tip 2 cm above the carina. Enteric tube in place with tip in the mid stomach. LUNGS AND PLEURA: Low lung volumes with vascular crowding. Right basilar opacity, favoring atelectasis. No pleural effusion. No pneumothorax. HEART AND MEDIASTINUM: No acute abnormality of the cardiac and mediastinal silhouettes. BONES AND SOFT TISSUES: No acute osseous abnormality. IMPRESSION: 1. Endotracheal tube and enteric tube in appropriate position. 2. Right basilar opacity, favoring atelectasis. Electronically signed by: Pinkie Pebbles MD 07/25/2024 05:41 PM EST RP Workstation: [id number]   CT ANGIO HEAD  NECK W WO CM W PERF (CODE STROKE) LKW > 6h Result Date: 07/25/2024 EXAM: CTA Head and Neck with Perfusion 07/25/2024 03:46:29 PM TECHNIQUE: CTA of the head and neck was performed with the administration of intravenous contrast. 3D postprocessing with multiplanar reconstructions and MIPs was performed to evaluate the vascular anatomy. Cerebral perfusion analysis using computed tomography with contrast administration, including post-processing of parametric maps with determination of cerebral blood flow, cerebral blood volume, mean transit time and time-to-maximum. Automated exposure control, iterative reconstruction, and/or weight based adjustment of the mA/kV was utilized to reduce the radiation dose to as low as reasonably achievable. CONTRAST: Without and with IV contrast. 100 mL (iohexol  (OMNIPAQUE ) 350 MG/ML injection 100 mL IOHEXOL  350 MG/ML SOLN). COMPARISON: Same day CT head. CLINICAL HISTORY: Neuro deficit, acute, stroke suspected. FINDINGS: CTA NECK: AORTIC ARCH AND ARCH VESSELS: Mild atherosclerosis along the proximal left subclavian artery without significant stenosis. No dissection or arterial injury. No significant stenosis of the brachiocephalic or subclavian arteries. CERVICAL CAROTID ARTERIES: The right carotid artery is patent from the origin to the skull base. There is mild atherosclerosis along the distal right common carotid artery and at the right carotid bifurcation without hemodynamically significant stenosis. The left carotid  artery is patent from the origin to the skull base. Mild atherosclerosis of the distal left common carotid artery and left carotid bifurcation without hemodynamically significant stenosis. No dissection or arterial injury. CERVICAL VERTEBRAL ARTERIES: The right vertebral artery is dominant. The vertebral arteries are patent from the origins to the vertebrobasilar confluence. There is long segment severe stenosis of the left V1 segment without evidence of occlusion.  Additional atherosclerosis of the bilateral V4 segments without significant stenosis. No dissection or arterial injury. LUNGS AND MEDIASTINUM: Unremarkable. SOFT TISSUES: Subcentimeter nodules in the right thyroid  lobe. BONES: No acute abnormality. CTA HEAD: ANTERIOR CIRCULATION: The intracranial internal carotid arteries are patent bilaterally. Mild atherosclerosis of the carotid siphons without significant stenosis. The anterior cerebral arteries are patent bilaterally. Right MCA is patent. There is mild stenosis of the distal right M1 segment. The left MCA is patent. There is mild stenosis of the liver and kidney division branches of the left MCA without evidence of focal stenosis or evidence of occlusion. Additional atherosclerotic irregularity of distal left MCA branches in the posterior aspect of the left MCA territory. No aneurysm. POSTERIOR CIRCULATION: There is moderate stenosis of the posterior P2 segment of the left PCA. No significant stenosis of the basilar artery. No significant stenosis of the vertebral arteries. No aneurysm. OTHER: No dural venous sinus thrombosis on this non-dedicated study. CT PERFUSION: EXAM QUALITY: The perfusion portion of the examination is significantly limited due to motion artifact. CORE INFARCT (CBF<30% volume): There is no region of cerebral blood flow less than 30 percent. 0 mL TOTAL HYPOPERFUSION (Tmax>6s volume): There are scattered areas of elevated Tmax greater than 6 seconds with a volume of 12 mL. PENUMBRA: Mismatch volume of 12 mL. The majority of the regions of elevated Tmax are favored to be artifactual given the degree of motion artifact. However, some of the more prolonged regions of elevated Tmax appear to localize within the anterior aspect of the left MCA territory, correlating with region of clinical concern. Recommend MRI brain for further evaluation. Mismatch ratio: not applicable Location: Anterior aspect of the left MCA territory. IMPRESSION: 1. No  acute large vessel occlusion. 2. CT perfusion is significantly limited by motion artifact. Some regions of elevated Tmax appear to localize to the anterior left MCA territory, which could reflect true perfusion delay. Recommend MRI brain for further evaluation. 3. 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 4. Moderate stenosis of the posterior P2 segment of the left PCA. 5. Diminutive caliber of left M2 superior division branches without focal stenosis or occlusion. Atherosclerotic irregularity of distal left MCA branches in the posterior aspect of the left MCA territory. 6. Finding of no LVO and perfusion results were discussed with Dr. Michaela at 3:47 PM on 07/25/24. Electronically signed by: Donnice Mania MD 07/25/2024 04:17 PM EST RP Workstation: [id number]   CT HEAD CODE STROKE WO CONTRAST Result Date: 07/25/2024 EXAM: CT HEAD WITHOUT CONTRAST 07/25/2024 03:42:53 PM TECHNIQUE: CT of the head was performed without the administration of intravenous contrast. Automated exposure control, iterative reconstruction, and/or weight based adjustment of the mA/kV was utilized to reduce the radiation dose to as low as reasonably achievable. COMPARISON: 07/25/2024 CLINICAL HISTORY: Neuro deficit, acute, stroke suspected. FINDINGS: BRAIN AND VENTRICLES: Diffuse age-appropriate atrophy throughout brain parenchyma. Mild periventricular white matter changes and mild ex vacuo ventricular dilatation. No acute hemorrhage. No evidence of acute infarct. No hydrocephalus. No extra-axial collection. No mass effect or midline shift. ORBITS: Right periorbital hematoma similar  to prior exam. SINUSES: No acute abnormality. SOFT TISSUES AND SKULL: Right periorbital hematoma similar to prior exam. No skull fracture. [place] Stroke Program Early CT (ASPECT) Score: Ganglionic (caudate, internal capsule, lentiform nucleus, insula, M1-M3): 7 Supraganglionic (M4-M6): 3 Total: 10 IMPRESSION: 1. No  acute intracranial abnormality. 2. ASPECTS 10. 3. Right periorbital hematoma, unchanged from prior exam. 4. Findings called to Dr. Michaela at 3:47 PM on 07/25/24. Electronically signed by: Donnice Mania MD 07/25/2024 04:00 PM EST RP Workstation: [id number]   DG Pelvis Portable Result Date: 07/25/2024 EXAM: 1 or 2 VIEW(S) XRAY OF THE PELVIS 07/25/2024 02:28:02 PM COMPARISON: CT chest, abdomen, and pelvis 07/25/2024. CLINICAL HISTORY: Unresponsive after unwitnessed fall. FINDINGS: BONES AND JOINTS: No acute fracture. Degenerative changes in the spine and hips. SI joints and symphysis pubis are not displaced. SOFT TISSUES: Residual contrast material in the bladder and renal collecting systems. IMPRESSION: 1. No acute fracture or pelvic ring disruption identified. Electronically signed by: Elsie Gravely MD 07/25/2024 02:46 PM EST RP Workstation: [id number]   DG Forearm Right Result Date: 07/25/2024 EXAM: 1 VIEW(S) XRAY OF THE RIGHT FOREARM 07/25/2024 02:28:02 PM COMPARISON: None available. CLINICAL HISTORY: trauma trauma FINDINGS: BONES AND JOINTS: No evidence of acute fracture or dislocation of the right forearm. Degenerative changes in the right wrist. SOFT TISSUES: Mild soft tissue swelling over the dorsum of the forearm. IMPRESSION: 1. Mild soft tissue swelling over the dorsum of the forearm. 2. No acute fracture or dislocation of the right forearm. Electronically signed by: Elsie Gravely MD 07/25/2024 02:44 PM EST RP Workstation: [id number]   DG Chest Port 1 View Result Date: 07/25/2024 EXAM: 1 VIEW(S) XRAY OF THE CHEST 07/25/2024 02:28:02 PM COMPARISON: CT chest abdomen and pelvis 07/25/2024. CLINICAL HISTORY: Trauma. FINDINGS: LUNGS AND PLEURA: Shallow inspiration. Infiltration or atelectasis in the right lung base. No pleural effusion. No pneumothorax. HEART AND MEDIASTINUM: Mediastinal contours appear intact. BONES AND SOFT TISSUES: Degenerative changes in the spine and shoulders. IMPRESSION:  1. Infiltration or atelectasis in the right lung base. 2. No pleural effusion or pneumothorax. Electronically signed by: Elsie Gravely MD 07/25/2024 02:43 PM EST RP Workstation: [id number]   CT Head Wo Contrast Result Date: 07/25/2024 EXAM: CT HEAD WITH INTRAVENOUS CONTRAST 07/25/2024 01:54:56 PM TECHNIQUE: CT of the head was performed with the administration of 75 mL of iohexol  (OMNIPAQUE ) 350 MG/ML injection. Automated exposure control, iterative reconstruction, and/or weight based adjustment of the mA/kV was utilized to reduce the radiation dose to as low as reasonably achievable. COMPARISON: None available. CLINICAL HISTORY: Mental status change, unknown cause; Polytrauma, blunt. FINDINGS: BRAIN AND VENTRICLES: No acute intracranial hemorrhage. No mass effect or midline shift. No extra-axial fluid collection. No evidence of acute infarct. No hydrocephalus. Cavum septum pellucidum and vergae. Hypoattenuating foci in the cerebral white matter, most likely representing chronic small vessel disease. ORBITS: A right lateral periorbital hematoma is identified which has a thickness of 1 cm, image 16/3. SINUSES AND MASTOIDS: Mild mucosal thickening involving the ethmoid air cells, right maxillary sinus and sphenoid sinus. SOFT TISSUES AND SKULL: No acute skull fracture. A right lateral periorbital hematoma is identified which has a thickness of 1 cm, image 16/3. IMPRESSION: 1. No acute intracranial abnormality. 2. Right lateral periorbital hematoma measuring 1 cm in thickness. 3. Mild paranasal sinus mucosal thickening. Electronically signed by: Waddell Calk MD 07/25/2024 02:14 PM EST RP Workstation: [id number]   CT CHEST ABDOMEN PELVIS W CONTRAST Result Date: 07/25/2024 EXAM: CT CHEST, ABDOMEN AND PELVIS WITH CONTRAST 07/25/2024 01:54:56 PM TECHNIQUE: CT  of the chest, abdomen and pelvis was performed with the administration of 75 mL of iohexol  (OMNIPAQUE ) 350 MG/ML injection. Multiplanar reformatted images  are provided for review. Automated exposure control, iterative reconstruction, and/or weight based adjustment of the mA/kV was utilized to reduce the radiation dose to as low as reasonably achievable. COMPARISON: None available. CLINICAL HISTORY: Sepsis. FINDINGS: CHEST: Mild scattered calcified plaque in the descending thoracic aorta. MEDIASTINUM AND LYMPH NODES: 3 vessel coronary calcifications. Heart and pericardium are otherwise unremarkable. The central airways are clear. No mediastinal, hilar or axillary lymphadenopathy. LUNGS AND PLEURA: Subsegmental pleural based atelectasis, consolidation or scarring laterally in the right middle lobe. Dependent atelectasis in both lung bases. No pleural effusion or pneumothorax. ABDOMEN AND PELVIS: LIVER: The liver is unremarkable. GALLBLADDER AND BILE DUCTS: Multiple partially calcified gallstones measuring up to 1 cm in diameter in the gallbladder fundus. No biliary ductal dilatation. SPLEEN: No acute abnormality. PANCREAS: No acute abnormality. ADRENAL GLANDS: No acute abnormality. KIDNEYS, URETERS AND BLADDER: No stones in the kidneys or ureters. No hydronephrosis. No perinephric or periureteral stranding. Urinary bladder is unremarkable. GI AND BOWEL: Stomach demonstrates no acute abnormality. A few scattered diverticula near the descending sigmoid junction without adjacent inflammatory change. There is no bowel obstruction. REPRODUCTIVE ORGANS: No acute abnormality. PERITONEUM AND RETROPERITONEUM: No ascites. No free air. VASCULATURE: Moderate calcified plaque in the abdominal aorta without aneurysm. Aorta is normal in caliber. ABDOMINAL AND PELVIS LYMPH NODES: No lymphadenopathy. REPRODUCTIVE ORGANS: No acute abnormality. BONES AND SOFT TISSUES: Vertebral endplate spurring at multiple levels in the lower thoracic spine. Right shoulder DJD. No focal soft tissue abnormality. IMPRESSION: 1. No acute abnormality of the chest, abdomen, and pelvis related to sepsis.  Electronically signed by: Katheleen Faes MD 07/25/2024 02:14 PM EST RP Workstation: [id number]   CT Cervical Spine Wo Contrast Result Date: 07/25/2024 EXAM: CT CERVICAL SPINE WITHOUT CONTRAST 07/25/2024 01:54:56 PM TECHNIQUE: CT of the cervical spine was performed without the administration of intravenous contrast. Multiplanar reformatted images are provided for review. Automated exposure control, iterative reconstruction, and/or weight based adjustment of the mA/kV was utilized to reduce the radiation dose to as low as reasonably achievable. COMPARISON: None available. CLINICAL HISTORY: Neck trauma (Age >= 65y). FINDINGS: BONES AND ALIGNMENT: No acute fracture or traumatic malalignment. Acquired fusion at C5-C6. 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 SOFT TISSUES: No prevertebral soft tissue swelling. Atherosclerotic calcifications of the carotid bulbs. IMPRESSION: 1. No acute fracture or traumatic malalignment of the cervical spine. 2. 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 Electronically signed by: Ryan Chess MD 07/25/2024 02:13 PM EST RP Workstation: [id number]   Intravitreal Injection, Pharmacologic Agent - OD - Right Eye Result Date: 07/13/2024 Time Out 07/13/2024. 1:50 PM. Confirmed correct patient, procedure, site, and patient consented. Anesthesia Topical anesthesia was used. Anesthetic medications included Lidocaine  2%, Proparacaine 0.5%. Procedure Preparation included 5% betadine  to ocular surface, eyelid speculum. A supplied needle was used. Injection: 6 mg faricimab -svoa 6 MG/0.05ML (Patient supplied)   Route: Intravitreal, Site: Right Eye   NDC: 49757-903-98, Lot: [id number], Expiration date: 05/10/2026, Waste: 0 mL Post-op Post injection exam found visual acuity of at least counting fingers.  The patient tolerated the procedure well. There were no complications. The patient received written and verbal post procedure care education. Notes **SAMPLE MEDICATION ADMINISTERED**  OCT, Retina - OU - Both Eyes Result  Date: 07/13/2024 Right Eye Quality was good. Central Foveal Thickness: 370. Progression has improved. Findings include normal foveal contour, no SRF, intraretinal hyper-reflective material, intraretinal fluid (Persistent IRF inferotemporal fovea and mac-- slightly improved). Left Eye Quality was good. Central Foveal Thickness: 344. Progression has been stable. Findings include no SRF, abnormal foveal contour, intraretinal hyper-reflective material, epiretinal membrane, intraretinal fluid, macular pucker, vitreomacular adhesion (ERM with blunting of foveal contour; stable improvement in [facility] and cystic changes temporal mac). 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 CME - cystoid macular edema. PED - pigment epithelial detachment. IRF - intraretinal fluid. SRF - subretinal fluid. EZ - ellipsoid zone. ERM - epiretinal membrane. ORA - outer retinal atrophy. ORT - outer retinal tubulation. SRHM - subretinal hyper-reflective material    Labs:  Basic Metabolic Panel: Recent Labs  Lab 08/03/24 0426 08/08/24 0519  NA 138 135  K 4.5 4.3  CL 102 101  CO2 27 26  GLUCOSE 173* 122*  BUN 15 16  CREATININE 0.73 0.79  CALCIUM  9.6 9.5    CBC: Recent Labs  Lab 08/03/24 0426 08/08/24 0519  WBC 9.5 8.2  NEUTROABS 5.5 4.6  HGB 11.0* 11.2*  HCT 33.8* 33.7*  MCV 97.1 97.1  PLT 344 421*    CBG: Recent Labs  Lab 08/08/24 1203 08/08/24 1656 08/08/24 2026 08/09/24 0547 08/09/24 1147  GLUCAP 214* 175* 95 111*  144*    Brief HPI:   RENA HUNKE is a 73 y.o. female  with PMHx hypertension, hyperlipidemia, type 2 diabetes who presented to [facility] on 12/15 with acute encephalopathy.  Per EMS report the patient was found down on the ground after a fall and unresponsive.  She was not found until 12/15 (patient lives with her spouse who has dementia).  Upon arrival to ER patient exhibited altered mental status with GCS of 8, O2 sats 90% on room air. She was placed on nonrebreather but unable to protect airway for requiring intubation.  Initial NIHSS 25.  Labs: Glucose 400s, WBC 13.5, CK 4216, lactic acid 4.7 UA positive for UTI.  Bruising noted to the right periorbital area and swelling of right hand and forearm.  No other obvious injuries or overt infection.   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  Code activation delayed due to unknown LKW but patient exhibited inability to move right upper extremity.  Neurology was consulted who activated code stroke.  Follow-up CTA without LVO.  Neurology recommended EEG and MRI. EEG showed epileptic discharges and the patient was loaded with Keppra  without recurrent seizures. 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 2D echocardiogram showed LVEF 60 to 65%, atria normal in size.  LDL 145 and hemoglobin A1c 6.4.  Aspirin  81 mg daily and Lovenox  for DVT prophylaxis.   Hospital course complicated by encephalopathy, acute hypoxic respiratory failure, chest x-ray revealed right basilar opacity and concerns for aspiration pneumonia.  Blood cultures with streptococcal growth, repeat blood cultures done 12/16 showed no growth suspected contamination. She was placed on 5 day course of IV ceftriaxone .  Extubated on 07/29/2024, she was n.p.o., Cortrak placed.  Speech therapy followed and conducted slums test that revealed score of 7/26, severe cognitive impairment.  Swallowing improved and the patient was started on D3 thin liquid diet on 12/21.  Pt has had diarrhea, thought to be due to laxative/antibiotic/tube feed related, started on fiber.    Prior to arrival the patient was independent, retired and driving. She lives in a two-level home with the ability to live on the main level with her spouse.  OT evaluated and patient demonstrated total assist lower body ADLs min to mod assist for upper body ADLs and min assist with transfer. Therapy evaluations completed due to patient decreased functional mobility was admitted for a comprehensive rehab program.    Inpatient Rehabilitation Course: LASHAUN POCH was admitted to rehab 08/02/2024 for inpatient therapies to consist of PT, ST and OT at least three hours five days a week. Past admission physiatrist, therapy team and rehab RN have worked together to provide customized collaborative inpatient rehab.  Anticoagulation:   Pain Management:   Mood/Behavior/Sleep:   Dr. Corina Neuro Psychologist consulted for coping and adjustment issues in the setting of extended hospital stay. No new medications or follow up advised.    Skin/Wound Care: Wound care/signs and symptoms of infection discussed at discharge.    Fluid/Nutrition/Electrolytes: Intake and output were monitored along with***.  The patient was maintained on a *** diet with *** supplementation. C  CHF instructions provided.    Hypertension:   (Orthostatic hypotension was monitored, and blood pressures remained controlled. Patient advised to get blood pressure cuff for monitoring at home. Patient reports access to BP cuff. Education for BP parameters discussed. The patient is scheduled to establish care/follow up with PCP upon discharge)   Hyperlipidemia:    T2DM: diabetes has been monitored with ac/hs CBG checks and SSI was use prn for tighter BS control. Diabetic teaching was completed.     Planned Outpatient Follow-Up:  -[facility] -PCP -PM&R     Rehab course: During patient's stay in rehab weekly team conferences were held to monitor patient's progress, set goals and discuss barriers to discharge. At admission, patient required OT evaluated and patient demonstrated total assist lower body ADLs min to mod assist for upper body ADLs and min assist with transfer.   Occupational Therapy: Patient has met *** long term goals due to improved activity tolerance, improved balance, functional use of *** and ***, and improved coordination.  Patient to discharge at overall ***, with good understanding and adherence to precautions. Patient's care partner *** to provide the necessary physical assistance at discharge.  He/She will benefit from ongoing OT in *** setting to continue to advance functional skills in the area of BADL.   Physical Therapy: Patient has met *** long term goals due to improved activity tolerance, improved balance, improved postural control, increased strength, decreased pain, ability to compensate for deficits, improved attention, improved awareness, and improved coordination.  Patient to discharge at an ***.   Patient's care partner *** to provide the necessary physical assistance at discharge.  He/She will benefit from ongoing skilled PT services in *** setting to continue to advance safe functional mobility, address ongoing impairments in strength, ROM, balance, endurance, gait, and minimize fall risk.   Speech Therapy:  Discharging at overall ***.    Discharge plan was discussed with patient and/or family member and they verbalized understanding and agreed with it.      Disposition: Discharge disposition: 01-Home or Self Care        Diet: Diabetic   Special Instructions:  -No driving or operating heavy machinery until cleared by provider  -  No smoking or alcohol  or illicit drug use    Discharge Instructions     Ambulatory referral to Neurology   Complete by: As directed    An  appointment is requested in approximately: 4 weeks   Ambulatory referral to Occupational Therapy   Complete by: As directed    Eval and treat   Ambulatory referral to Physical Medicine Rehab   Complete by: As directed    Ambulatory referral to Physical Therapy   Complete by: As directed    Ambulatory referral to Speech Therapy   Complete by: As directed       Allergies as of 08/09/2024       Reactions   Ace Inhibitors Cough   Zetia  [ezetimibe ] Swelling, Other (See Comments)   Sore throat        Medication List     STOP taking these medications    docusate 50 MG/5ML liquid Commonly known as: COLACE   fiber supplement (BANATROL TF) liquid   insulin  aspart 100 UNIT/ML injection Commonly known as: novoLOG    insulin  glargine 100 UNIT/ML injection Commonly known as: LANTUS        TAKE these medications    acetaminophen  325 MG tablet Commonly known as: TYLENOL  Take 2 tablets (650 mg total) by mouth every 6 (six) hours as needed for fever or mild pain (pain score 1-3).   amLODipine  10 MG tablet Commonly known as: NORVASC  Take 1 tablet (10 mg total) by mouth daily. Start taking on: August 10, 2024 What changed: how to take this   ascorbic acid  1000 MG tablet Commonly known as: VITAMIN C  Take 1 tablet (1,000 mg total) by mouth daily. Start taking on: August 10, 2024   aspirin  81 MG chewable tablet Chew 1 tablet (81 mg total) by mouth daily. Start taking on: August 10, 2024 What changed: how to take this   feeding supplement Liqd Take 237 mLs by mouth 2 (two) times daily with a meal. What changed: Another medication with the same name was removed. Continue taking this medication, and follow the directions you see here.   gabapentin  300 MG capsule Commonly known as: NEURONTIN  Take 1 capsule (300 mg total) by mouth 3 (three) times daily.   levETIRAcetam  500 MG tablet Commonly known as: KEPPRA  Take 1 tablet (500 mg total) by mouth 2 (two) times  daily. What changed: how to take this   metFORMIN  1000 MG tablet Commonly known as: GLUCOPHAGE  Take 1 tablet (1,000 mg total) by mouth 2 (two) times daily with a meal.   polyethylene glycol 17 g packet Commonly known as: MIRALAX  / GLYCOLAX  Take 17 g by mouth daily as needed for moderate constipation. What changed: how to take this   prochlorperazine  5 MG tablet Commonly known as: COMPAZINE  Take 1-2 tablets (5-10 mg total) by mouth every 6 (six) hours as needed for nausea.   propranolol  ER 60 MG 24 hr capsule Commonly known as: INDERAL  LA Take 1 capsule (60 mg total) by mouth daily. Start taking on: August 10, 2024   rosuvastatin  40 MG tablet Commonly known as: CRESTOR  Take 1 tablet (40 mg total) by mouth daily. Start taking on: August 10, 2024 What changed: how to take this        Follow-up Information     Hasanaj, Yancey LABOR, MD Follow up.   Specialty: Internal Medicine Why: Call for an appointment. Contact information: [address] [place] [address] [phone]         Carilyn Prentice BRAVO,  MD Follow up.   Specialty: Physical Medicine and Rehabilitation Why: Office will call for appointment Contact information: [address] [place] [address] [place] [address] [phone]         [facility] Follow up.   Specialty: Neurology Why: Call for an appointment. Contact information: [address] [place]  [address] [phone]                Signed: Daphne LOISE Satterfield 08/09/2024, 3:22 PM

## 2024-08-09 NOTE — Progress Notes (Signed)
 Inpatient Rehabilitation Care Coordinator Discharge Note   Patient Details  Name: Carolyn Hunter MRN: [id number] Date of Birth: 1950-09-18   Discharge location: D/c to home  Length of Stay: 7 days  Discharge activity level:    Home/community participation: Limited  Patient response un:Yzjouy Literacy - How often do you need to have someone help you when you read instructions, pamphlets, or other written material from your doctor or pharmacy?: Never  Patient response un:Dnrpjo Isolation - How often do you feel lonely or isolated from those around you?: Never  Services provided included: MD, RD, PT, OT, SLP, RN, CM, Neuropsych, SW, Pharmacy, TR  Financial Services:  Field Seismologist Utilized: Private Insurance [employer]  Choices offered to/list presented to: patient family  Follow-up services arranged:  Outpatient, DME    Outpatient Servicies: [name]/SLP DME : [facility] for TTB    Patient response to transportation need: Is the patient able to respond to transportation needs?: Yes In the past 12 months, has lack of transportation kept you from medical appointments or from getting medications?: No In the past 12 months, has lack of transportation kept you from meetings, work, or from getting things needed for daily living?: No   Patient/Family verbalized understanding of follow-up arrangements:  Yes  Individual responsible for coordination of the follow-up plan: contact pt dtr [name]  Confirmed correct DME delivered: Graeme DELENA Jude 08/09/2024    Comments (or additional information):fam edu completed  Summary of Stay    Date/Time Discharge Planning CSW  08/09/24 0836 Daughter-kelly to move in to assist with pt and her husband. Pt's husband has dementia and seems to need supervision. Pt is doing well and making good progress. DME- TTB, and Outpatient-[facility] for PT/Ot/SLP. Family will transport. Fam edu completed on Tuesday  9am-12pm. SW will confirm there are no barriers to discharge. AAC  08/08/24 9178 Daughter-kelly to move in to assist with pt and her husband. Pt's husband has dementia and seems to need supervision. Pt is doing well and making good progress. Await team's evaluations for DME and follow up AAC  08/05/24 1016 Daughter-kelly to move in to assist with pt and her husband. Pt's husband has dementia and seems to need supervision. Pt is doing well and making good progress. Await team's evaluations for DME and follow up RGD  08/03/24 0911 new evaluation according to acute chart going home with daughter-kelly moving in with due to husband has dementia and needs care. Await therapy evaluations RGD       Wylma Tatem A Jude

## 2024-08-10 ENCOUNTER — Other Ambulatory Visit (HOSPITAL_COMMUNITY): Payer: Self-pay

## 2024-08-10 DIAGNOSIS — I63512 Cerebral infarction due to unspecified occlusion or stenosis of left middle cerebral artery: Secondary | ICD-10-CM

## 2024-08-10 LAB — GLUCOSE, CAPILLARY: Glucose-Capillary: 95 mg/dL (ref 70–99)

## 2024-08-10 NOTE — Progress Notes (Signed)
 "                                                        PROGRESS NOTE   Subjective/Complaints:    ROS: neg CP, SOB, N/V/D  Objective:   No results found.  Recent Labs    08/08/24 0519  WBC 8.2  HGB 11.2*  HCT 33.7*  PLT 421*    Recent Labs    08/08/24 0519  NA 135  K 4.3  CL 101  CO2 26  GLUCOSE 122*  BUN 16  CREATININE 0.79  CALCIUM  9.5     Intake/Output Summary (Last 24 hours) at 08/10/2024 0807 Last data filed at 08/10/2024 0700 Gross per 24 hour  Intake 650 ml  Output --  Net 650 ml        Physical Exam: Vital Signs Blood pressure (!) 149/77, pulse 85, temperature 97.6 F (36.4 C), resp. rate 18, height 5' 2 (1.575 m), weight 68 kg, SpO2 92%.  General: No acute distress Mood and affect are appropriate Heart: Regular rate and rhythm no rubs murmurs or extra sounds Lungs: Clear to auscultation, breathing unlabored, no rales or wheezes Abdomen: Positive bowel sounds, soft nontender to palpation, nondistended Extremities: No clubbing, cyanosis, or edema Skin: No evidence of breakdown, no evidence of rash  Extremities- RUE esp hand and fingers/thumb, 2+ edema (mildly improved)  Neuro:  Alert and oriented x 3. Fair insight and awareness. fair Memory. Processing sl delayed. Normal language and speech. Cranial nerve exam unremarkable. MMT: RUE 4- /5 prox to distal.  LUE  5/5. Sensation decreased to LT in RUE. -but no peripheral nerve distribution  Musculoskeletal: RUE perhaps a little less tender to palpation, swollen as above , reduced finger flexion due to swelling    Assessment/Plan: 1. Functional deficits due to CVA Stable for D/C today F/u PCP in 2-3 weeks F/u cardiology Dr Alvan 10/11/2024 F/u Dr Valdemar 08/24/2024 optho, retinal specialist  F/u Neuro 1-2 mo F/u PM&R 2 weeks See D/C summary See D/C instructions   Care Tool:  Bathing    Body parts bathed by patient: Chest, Abdomen, Front perineal area, Right arm, Face, Left lower leg,  Right lower leg, Left upper leg, Right upper leg, Buttocks, Left arm   Body parts bathed by helper: Left arm, Buttocks     Bathing assist Assist Level: Supervision/Verbal cueing     Upper Body Dressing/Undressing Upper body dressing   What is the patient wearing?: Pull over shirt    Upper body assist Assist Level: Independent    Lower Body Dressing/Undressing Lower body dressing      What is the patient wearing?: Underwear/pull up, Pants     Lower body assist Assist for lower body dressing: Supervision/Verbal cueing     Toileting Toileting    Toileting assist Assist for toileting: Supervision/Verbal cueing     Transfers Chair/bed transfer  Transfers assist     Chair/bed transfer assist level: Supervision/Verbal cueing     Locomotion Ambulation   Ambulation assist      Assist level: Supervision/Verbal cueing Assistive device: No Device Max distance: 150   Walk 10 feet activity   Assist     Assist level: Supervision/Verbal cueing Assistive device: No Device   Walk 50 feet activity   Assist    Assist level: Supervision/Verbal cueing Assistive  device: No Device    Walk 150 feet activity   Assist Walk 150 feet activity did not occur: Safety/medical concerns (fatigue)  Assist level: Supervision/Verbal cueing Assistive device: No Device    Walk 10 feet on uneven surface  activity   Assist     Assist level: Supervision/Verbal cueing Assistive device: Other (comment) (no AD)   Wheelchair     Assist Is the patient using a wheelchair?: No Type of Wheelchair: Manual    Wheelchair assist level: Dependent - Patient 0%      Wheelchair 50 feet with 2 turns activity    Assist        Assist Level: Dependent - Patient 0%   Wheelchair 150 feet activity     Assist      Assist Level: Dependent - Patient 0%   Blood pressure (!) 149/77, pulse 85, temperature 97.6 F (36.4 C), resp. rate 18, height 5' 2 (1.575 m),  weight 68 kg, SpO2 92%.  Medical Problem List and Plan: 1. 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 Zio patch at discharge.             -patient may shower             -ELOS/Goals: plan d/c today  , outpatient PT, OT with PMR f/u 2.  Antithrombotics: -DVT/anticoagulation:  Mechanical: Sequential compression devices, below knee Bilateral lower extremities Pharmaceutical: d/c Lovenox  ambulating 200-300' with PT              -antiplatelet therapy: Aspirin     3. Pain Management: Tylenol  prn , mainly due to RIght hand and forearm muscle necrosis from laying on arm 48h PTA     12/30- since tramadol  not effective really, will d/c Increase gabapentin  to 300mg  TID, may not need this med long term  4. Mood/Behavior/Sleep: LCSW to follow for evaluation and support when available.              -antipsychotic agents: n/a   5. Neuropsych/cognition: This patient is capable of making decisions on her own behalf.   6. Skin/Wound Care: Routine pressure relief measures.  Bruising noted to the right eye with ecchymosis.  CT head with R lateral periorbital hematoma.    7. Fluids/Electrolytes/Nutrition: Monitor I&O and weight. Follow up labs CBC/CMP    -SLP and RD consult for Cortrak mgt. Regular diet started off TF, appetite ok this am . 8. 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 Will need Zioptach at discharge.                          -Continue statin and aspirin .     9. Seizures: EEG 12/17 with epileptogenicity arising from the left central temporoparietal region. EEG 12/18 w/ moderate diffuse encephalopathy.              -Continue Keppra  500 mg BID.    10. Acute hypoxic respiratory failure:Resolved , Cxr concerning for aspiration PNA. Blood cultures 12/15 w/ aerococcus viridans in 1 bottle, suspect contaminant, 12/16 Bcx no growth.              -completed 5 day course of ceftriaxone                -encourage IS, OOB.    11. T2DM: A1c 6.4. CBG this AM 170.             -monitor cbg ac/hs with SSI novolog  and  Lantus  12 units daily.    -CBG (last 3)  Recent Labs    08/09/24 1658 08/09/24 2039 08/10/24 0532  GLUCAP 146* 212* 95     12/30 controlled on current regimen , elevation yesterday evening ? Diet related  12. Normocytic anemia: Copious blood noted during ETT exchange and bronchoscopy w/o acute bleed.              -Hgb up to 11.0    13. HLD: LDL 145, Crestor  40 mg.   14.  Diarrhea: Recent laxative antibiotic use.              - two T5 stools yesterday   -receiving bannatrol thru tube    -should improve further as we move from TF's  12/27- TF's/Cortrak off- LBM 12/25 15. R hand swelling: Pt states she was down for 2 d laying on arm, needs retrograde massage , elevation.  CK peaked at [facility] [address] now 321             -Had xray that did not show fracture  -U/S without thrombi  -have discussed massage, elevation of RUE, AROM  -might benefit from K-tape   12/27- hand significantly swollen- ordered 2 compression gloves- 1 with full glove and 1 with partial fingers- since fingers also swollen  12/28- Ktape off- but compression glove not here yet- is propped up on pillow 16.HTN:  Long term goal 130/90             -Home meds norvasc /propranolol , monitor trend   HR mildly elevated regimen- resume propranolol  ER (home dose 60mg  /d) Vitals:   08/09/24 2038 08/10/24 0548  BP: (!) 142/70 (!) 149/77  Pulse: 80 85  Resp: 18 18  Temp: 97.8 F (36.6 C) 97.6 F (36.4 C)  SpO2: 95% 92%   HR improved, diastolic BP at goal systolic mildly elevated over goal and will need OP f/u with PCP and cardiology    LOS: 8 days A FACE TO FACE EVALUATION WAS PERFORMED  Carolyn Hunter 08/10/2024, 8:07 AM     "

## 2024-08-10 NOTE — Progress Notes (Signed)
 Inpatient Rehabilitation Discharge Medication Review by a Pharmacist  A complete drug regimen review was completed for this patient to identify any potential clinically significant medication issues.  High Risk Drug Classes Is patient taking? Indication by Medication  Antipsychotic Yes Compazine : nausea  Anticoagulant No   Antibiotic No   Opioid No   Antiplatelet Yes Aspirin : CVA ppx  Hypoglycemics/insulin  Yes Metformin : DM  Vasoactive Medication Yes Amlodipine , propranolol  ER: HTN  Chemotherapy No   Other Yes VitC: Wound care Keppra : seizures Crestor : CVA ppx, HLD Gabapentin , Tylenol : pain Miralax : constipation     Type of Medication Issue Identified Description of Issue Recommendation(s)  Drug Interaction(s) (clinically significant)     Duplicate Therapy     Allergy     No Medication Administration End Date     Incorrect Dose     Additional Drug Therapy Needed     Significant med changes from prior encounter (inform family/care partners about these prior to discharge).  Restart or discontinue as appropriate. Communicate medication changes with patient/family at discharge  Other       Clinically significant medication issues were identified that warrant physician communication and completion of prescribed/recommended actions by midnight of the next day:  No  Name of provider notified for urgent issues identified:   Provider Method of Notification:    Pharmacist comments:    Time spent performing this drug regimen review (minutes): 30   Delon Sax, PharmD, BCPS Clinical Pharmacist 08/10/2024 7:18 AM

## 2024-08-12 ENCOUNTER — Other Ambulatory Visit: Payer: Self-pay

## 2024-08-15 NOTE — Progress Notes (Shared)
 " [facility] - Clinic Note  08/24/2024     CHIEF COMPLAINT Patient presents for No chief complaint on file.    HISTORY OF PRESENT ILLNESS: Carolyn Hunter is a 74 y.o. female who presents to the clinic today for:    Pt states the vision is about the same.   Referring physician: Orpha Yancey LABOR, MD [address] [place],  [place] [address]  HISTORICAL INFORMATION:   Selected notes from the MEDICAL RECORD NUMBER Referral from Dr. EMERSON Kawasaki for DM exam   CURRENT MEDICATIONS: No current outpatient medications on file. (Ophthalmic Drugs)   No current facility-administered medications for this visit. (Ophthalmic Drugs)   Current Outpatient Medications (Other)  Medication Sig   acetaminophen  (TYLENOL ) 325 MG tablet Take 2 tablets (650 mg total) by mouth every 6 (six) hours as needed for fever or mild pain (pain score 1-3).   amLODipine  (NORVASC ) 10 MG tablet Take 1 tablet (10 mg total) by mouth daily.   ascorbic acid  (VITAMIN C ) 1000 MG tablet Take 1 tablet (1,000 mg total) by mouth daily.   aspirin  81 MG chewable tablet Chew 1 tablet (81 mg total) by mouth daily.   feeding supplement (ENSURE PLUS HIGH PROTEIN) LIQD Take 237 mLs by mouth 2 (two) times daily with a meal.   gabapentin  (NEURONTIN ) 300 MG capsule Take 1 capsule (300 mg total) by mouth 3 (three) times daily.   levETIRAcetam  (KEPPRA ) 500 MG tablet Take 1 tablet (500 mg total) by mouth 2 (two) times daily.   metFORMIN  (GLUCOPHAGE ) 1000 MG tablet Take 1 tablet (1,000 mg total) by mouth 2 (two) times daily with a meal.   polyethylene glycol powder (GLYCOLAX /MIRALAX ) 17 GM/SCOOP powder Dissolve 1 capful (17g) in 4-8 ounces of liquid and take by mouth daily as needed for moderate constipation.   prochlorperazine  (COMPAZINE ) 5 MG tablet Take 1-2 tablets (5-10 mg total) by mouth every 6 (six) hours as needed for nausea.   propranolol  ER (INDERAL  LA) 60 MG 24 hr capsule Take 1 capsule (60 mg total) by mouth  daily.   rosuvastatin  (CRESTOR ) 40 MG tablet Take 1 tablet (40 mg total) by mouth daily.   No current facility-administered medications for this visit. (Other)   REVIEW OF SYSTEMS:    ALLERGIES Allergies  Allergen Reactions   Ace Inhibitors Cough   Zetia  [Ezetimibe ] Swelling and Other (See Comments)    Sore throat   PAST MEDICAL HISTORY Past Medical History:  Diagnosis Date   Anxiety    Cataract    OS   Diabetes mellitus without complication (HCC)    Diabetic retinopathy (HCC)    NPDR OU   Hyperlipidemia    Hypertension    Hypertensive retinopathy    OU   Tremors of nervous system    just when I get in a nervous Situation.   Past Surgical History:  Procedure Laterality Date   CATARACT EXTRACTION W/PHACO Right 10/18/2018   Procedure: CATARACT EXTRACTION PHACO AND INTRAOCULAR LENS PLACEMENT (IOC);  Surgeon: Harrie Agent, MD;  Location: [facility];  Service: Ophthalmology;  Laterality: Right;  CDE: 8.11   CATARACT EXTRACTION W/PHACO Left 01/17/2019   Procedure: CATARACT EXTRACTION PHACO AND INTRAOCULAR LENS PLACEMENT (IOC);  Surgeon: Harrie Agent, MD;  Location: [facility];  Service: Ophthalmology;  Laterality: Left;  CDE: 6.38   CESAREAN SECTION     1988   COLONOSCOPY WITH PROPOFOL  N/A 05/04/2020   Procedure: COLONOSCOPY WITH PROPOFOL ;  Surgeon: Eartha Angelia Sieving, MD;  Location: [facility];  Service: Gastroenterology;  Laterality: N/A;  830   EYE SURGERY Right    Cataract extraction OD   POLYPECTOMY  05/04/2020   Procedure: POLYPECTOMY;  Surgeon: Eartha Angelia Sieving, MD;  Location: [facility];  Service: Gastroenterology;;   FAMILY HISTORY Family History  Problem Relation Age of Onset   Cancer Mother        breast and colon cancer   Breast cancer Mother    Diabetes Brother    Diabetes Son    Asthma Father        COPD   Diabetes Son    SOCIAL HISTORY Social History   Tobacco Use   Smoking status: Never   Smokeless tobacco: Never  Vaping Use    Vaping status: Never Used  Substance Use Topics   Alcohol  use: No   Drug use: No       OPHTHALMIC EXAM:  Not recorded    IMAGING AND PROCEDURES  Imaging and Procedures for 12/10/17          ASSESSMENT/PLAN: No diagnosis found.  1. Moderate nonproliferative diabetic retinopathy, both eyes  - Diabetic macular edema, both eyes -- relatively mild - S/P IVA OD #1 (12.17.18), #2 (01.28.19), #3 (02.27.19), #4 (04.03.19), #5 (05.01.19), #6 (05.27.20), #7 (07.08.20), #8 (06.18.25), #9 (08.13.25) - in reviewing her case, there was minimal response to IVA in OCT or BCVA  - S/P IVTA OD #1 (05.29.19), #2 (07.24.19), #3 (09.04.19), #4 (12.04.19), #5 (02.26.20), # 6 (08.05.20), # 7(09.30.20), #8 (12.23.20)   ========================== - s/p IVE OD #1 (03.03.21), #2 (03.31.21), #3 (04.28.21), #4 (05.28.21), #5 (08.04.21), #6 (09.08.21), #7 (10.06.21), #8 (12.15.21), #9 (02.09.22), #10 (05.11.22), #11 (8.24.22), #12 (10.05.22), #13 (12.14.22), #14 (02.22.23), #15 (07.26.23), #16 (07.26.23), #17 (10.04.23), #18 (12.20.23), #19 (03.11.24), #20 (12.18.24) - s/p IVE OS #1 (04.28.21), #2 (05.28.21), #3 (12.15.21), #4 (02.09.22), #5 (05.10.23), #6 (07.26.23), #7 (07.26.23), #8 (10.04.23), #9 (12.20.23), #10 (03.11.24) ========================== - s/p IVV OD #1 (sample--10.08.25), #2 (sample--12.03.25)  - S/P focal laser OS (09.02.20)  - s/p focal laser OD (11.17.21) - OCT shows OD: Persistent IRF inferotemporal fovea and mac-- slightly improved; OS: ERM with blunting of foveal contour; stable improvement in [facility] and cystic changes temporal mac at 8 weeks  - BCVA OD: 20/25- stable; OS stable at 20/20 - recommend IVV OD #3 [sample] today 01.14.26 w/ f/u dec to 6 wks ([employer] funding unavailable)  - pt wishes to proceed with injection  - RBA of procedure discussed, questions answered - informed consent obtained and signed - see procedure note - IVA informed consent obtained and re-signed  (06.18.25) - IVV informed consent obtained and signed (10.08.25) OD  - f/u 6 weeks, sooner prn -- DFE/OCT possible injection(s)  2. PVD / vitreous syneresis OD  - Discussed findings and prognosis  - No RT or RD on 360 scleral depressed exam  - Reviewed signs/symptoms of RT/RD  - Strict return precautions for any such RT/RD signs/symptoms   3. Epiretinal membrane, left eye  - mild ERM with VMA/VMT component - BCVA remains 20/20 - still asymptomatic, no metamorphopsia - no indication for surgery at this time - continue to monitor  4,5. Hypertensive retinopathy OU  - discussed importance of tight BP control  - continue to monitor  6. Pseudophakia OU  - s/p CE/IOL OU (Dr. Harrie, March/June 2020)  - beautiful surgeries, doing well  - continue to monitor  Ophthalmic Meds Ordered this visit:  No orders of the defined  types were placed in this encounter.    No follow-ups on file.  There are no Patient Instructions on file for this visit.   This document serves as a record of services personally performed by Redell JUDITHANN Hans, MD, PhD. It was created on their behalf by Almetta Pesa, an ophthalmic technician. The creation of this record is the provider's dictation and/or activities during the visit.    Electronically signed by: Almetta Pesa, OA, 08/15/2024  2:52 PM   Redell JUDITHANN Hans, M.D., Ph.D. Diseases & Surgery of the Retina and Vitreous [facility]  Abbreviations: M myopia (nearsighted); A astigmatism; H hyperopia (farsighted); P presbyopia; Mrx spectacle prescription;  CTL contact lenses; OD right eye; OS left eye; OU both eyes  XT exotropia; ET esotropia; PEK punctate epithelial keratitis; PEE punctate epithelial erosions; DES dry eye syndrome; MGD meibomian gland dysfunction; ATs artificial tears; PFAT's preservative free artificial tears; NSC nuclear sclerotic cataract; PSC posterior subcapsular cataract; ERM epi-retinal membrane; PVD posterior  vitreous detachment; RD retinal detachment; DM diabetes mellitus; DR diabetic retinopathy; NPDR non-proliferative diabetic retinopathy; PDR proliferative diabetic retinopathy; CSME clinically significant macular edema; DME diabetic macular edema; dbh dot blot hemorrhages; CWS cotton wool spot; POAG primary open angle glaucoma; C/D cup-to-disc ratio; HVF humphrey visual field; GVF goldmann visual field; OCT optical coherence tomography; IOP intraocular pressure; BRVO Branch retinal vein occlusion; CRVO central retinal vein occlusion; CRAO central retinal artery occlusion; BRAO branch retinal artery occlusion; RT retinal tear; SB scleral buckle; PPV pars plana vitrectomy; VH Vitreous hemorrhage; PRP panretinal laser photocoagulation; IVK intravitreal kenalog ; VMT vitreomacular traction; MH Macular hole;  NVD neovascularization of the disc; NVE neovascularization elsewhere; AREDS age related eye disease study; ARMD age related macular degeneration; POAG primary open angle glaucoma; EBMD epithelial/anterior basement membrane dystrophy; ACIOL anterior chamber intraocular lens; IOL intraocular lens; PCIOL posterior chamber intraocular lens; Phaco/IOL phacoemulsification with intraocular lens placement; PRK photorefractive keratectomy; LASIK laser assisted in situ keratomileusis; HTN hypertension; DM diabetes mellitus; COPD chronic obstructive pulmonary disease "

## 2024-08-17 NOTE — Progress Notes (Signed)
 ILENA DIECKMAN                                          MRN: [id number]   08/17/2024   The [facility] Quality Team Specialist reviewed this patient medical record for the purposes of chart review for care gap closure. The following were reviewed: chart review for care gap closure-kidney health evaluation for diabetes:eGFR  and uACR.    [facility] Quality Team

## 2024-08-24 ENCOUNTER — Other Ambulatory Visit (HOSPITAL_COMMUNITY): Payer: Self-pay

## 2024-08-24 ENCOUNTER — Encounter (INDEPENDENT_AMBULATORY_CARE_PROVIDER_SITE_OTHER): Admitting: Ophthalmology

## 2024-08-30 ENCOUNTER — Ambulatory Visit (HOSPITAL_COMMUNITY): Attending: Student | Admitting: Speech Pathology

## 2024-08-30 ENCOUNTER — Telehealth (HOSPITAL_COMMUNITY): Payer: Self-pay | Admitting: Speech Pathology

## 2024-08-30 DIAGNOSIS — I63512 Cerebral infarction due to unspecified occlusion or stenosis of left middle cerebral artery: Secondary | ICD-10-CM | POA: Insufficient documentation

## 2024-08-30 DIAGNOSIS — R2689 Other abnormalities of gait and mobility: Secondary | ICD-10-CM | POA: Insufficient documentation

## 2024-08-30 DIAGNOSIS — R262 Difficulty in walking, not elsewhere classified: Secondary | ICD-10-CM | POA: Insufficient documentation

## 2024-08-30 DIAGNOSIS — I63312 Cerebral infarction due to thrombosis of left middle cerebral artery: Secondary | ICD-10-CM | POA: Insufficient documentation

## 2024-08-30 NOTE — Telephone Encounter (Signed)
 Telephone Call:  Pt did not show for her 2:15 SLP evaluation 08/30/24. SLP left a voicemail regarding the same and asked for return call to re-schedule and a reminder for OT/PT.  Thank you,  Lamar Candy, CCC-SLP [phone]

## 2024-09-05 ENCOUNTER — Ambulatory Visit

## 2024-09-05 NOTE — Therapy (Signed)
 " OUTPATIENT PHYSICAL THERAPY LOWER EXTREMITY EVALUATION   Patient Name: Carolyn Hunter MRN: [id number] DOB:02/17/1951, 74 y.o., female Today's Date: 09/08/2024  END OF SESSION:  PT End of Session - 09/08/24 1339     Visit Number 1    Number of Visits 4    Date for Recertification  09/30/24    Authorization Type HTA    Authorization Time Period no auth needed    PT Start Time 1335    PT Stop Time 1410    PT Time Calculation (min) 35 min    Activity Tolerance Patient tolerated treatment well    Behavior During Therapy WFL for tasks assessed/performed          Past Medical History:  Diagnosis Date   Anxiety    Cataract    OS   Diabetes mellitus without complication (HCC)    Diabetic retinopathy (HCC)    NPDR OU   Hyperlipidemia    Hypertension    Hypertensive retinopathy    OU   Tremors of nervous system    just when I get in a nervous Situation.   Past Surgical History:  Procedure Laterality Date   CATARACT EXTRACTION W/PHACO Right 10/18/2018   Procedure: CATARACT EXTRACTION PHACO AND INTRAOCULAR LENS PLACEMENT (IOC);  Surgeon: Harrie Agent, MD;  Location: [facility];  Service: Ophthalmology;  Laterality: Right;  CDE: 8.11   CATARACT EXTRACTION W/PHACO Left 01/17/2019   Procedure: CATARACT EXTRACTION PHACO AND INTRAOCULAR LENS PLACEMENT (IOC);  Surgeon: Harrie Agent, MD;  Location: [facility];  Service: Ophthalmology;  Laterality: Left;  CDE: 6.38   CESAREAN SECTION     1988   COLONOSCOPY WITH PROPOFOL  N/A 05/04/2020   Procedure: COLONOSCOPY WITH PROPOFOL ;  Surgeon: Eartha Angelia Sieving, MD;  Location: [facility];  Service: Gastroenterology;  Laterality: N/A;  830   EYE SURGERY Right    Cataract extraction OD   POLYPECTOMY  05/04/2020   Procedure: POLYPECTOMY;  Surgeon: Eartha Angelia Sieving, MD;  Location: [facility];  Service: Gastroenterology;;   Patient Active Problem List   Diagnosis Date Noted   Normocytic anemia 08/09/2024   Swelling of arm  08/09/2024   Seizures (HCC) 08/09/2024   Acute hypoxic respiratory failure (HCC) 08/09/2024   CVA (cerebral vascular accident) (HCC) 08/02/2024   Acute ischemic left MCA stroke (HCC) 07/29/2024   Encephalopathy 07/27/2024   Coma (HCC) 07/25/2024   Tachycardia 03/28/2021   Onychomycosis of toenail 03/28/2021   Need for vaccination against Streptococcus pneumoniae using pneumococcal conjugate vaccine 13 01/06/2020   Nonspecific abnormal electrocardiogram (ECG) (EKG) 01/06/2020   Annual visit for general adult medical examination with abnormal findings 01/05/2020   Visit for screening mammogram 01/05/2020   Screening for osteoporosis 01/05/2020   Essential hypertension 01/05/2020   Encounter for screening colonoscopy 01/05/2020   Controlled type 2 diabetes mellitus without complication, without long-term current use of insulin  (HCC) 04/17/2017   White coat syndrome with hypertension 04/17/2017   HLD (hyperlipidemia) 04/17/2017   Tremor 04/17/2017    PCP: Orpha Yancey LABOR, MD  REFERRING PROVIDER: Jerilynn Daphne SAILOR, NP  REFERRING DIAG: [id number] (ICD-10-CM) - Acute ischemic left MCA stroke (HCC)  THERAPY DIAG:  Acute ischemic left MCA stroke (HCC)  Cerebrovascular accident (CVA) due to thrombosis of left middle cerebral artery (HCC)  Difficulty in walking, not elsewhere classified  Impairment of balance  Rationale for Evaluation and Treatment: Rehabilitation  ONSET DATE: 07/25/2024  SUBJECTIVE:   SUBJECTIVE STATEMENT: 74 year old female who presented to the hospital 12/15 with  fall, right-sided weakness, encephalopathy. Noted to have small left frontotemporal stroke on her MRI. Intubated 12/15-12/19.  Went to CIR 08/03/24 until 08/09/24 and discharged home.  She feels her biggest complaint is her right arm and hand.  Arrives with her son and AD.    PERTINENT HISTORY: PMH: hypertension, hyperlipidemia, type 2 diabetes. PAIN:  Are you having pain? Yes: NPRS scale: 7/10 Pain  location: Right hand Pain description: burning Aggravating factors: hanging her arm down Relieving factors: medication, elevation  PRECAUTIONS: Fall  RED FLAGS: None   WEIGHT BEARING RESTRICTIONS: No  FALLS:  Has patient fallen in last 6 months? Yes. Number of falls 1    OCCUPATION: retired  PLOF: Independent  PATIENT GOALS: get my hand back; work on my balance  NEXT MD VISIT: PRN  OBJECTIVE:  Note: Objective measures were completed at Evaluation unless otherwise noted.  DIAGNOSTIC FINDINGS:   PATIENT SURVEYS:  ABC scale: The Activities-Specific Balance Confidence (ABC) Scale 0% 10 20 30  40 50 60 70 80 90 100% No confidence<->completely confident  How confident are you that you will not lose your balance or become unsteady when you . . .   Date tested 0129/2026                                                  Total: #/16 01184/1600; 73.8*     COGNITION: Overall cognitive status: Within functional limits for tasks assessed     SENSATION: Burning right hand; altered sensation of fingers  EDEMA:  Right hand   LOWER EXTREMITY ROM:  Active ROM Right eval Left eval  Hip flexion    Hip extension    Hip abduction    Hip adduction    Hip internal rotation    Hip external rotation    Knee flexion    Knee extension    Ankle dorsiflexion    Ankle plantarflexion    Ankle inversion    Ankle eversion     (Blank rows = not tested)  LOWER EXTREMITY MMT:  MMT Right eval Left eval  Hip flexion 4+ 5  Hip extension    Hip abduction    Hip adduction    Hip internal rotation    Hip external rotation    Knee flexion    Knee extension 4+ 5  Ankle dorsiflexion 4+ 5  Ankle plantarflexion    Ankle inversion    Ankle eversion     (Blank rows = not tested)  FUNCTIONAL TESTS:  5 times sit to stand: 10.43 sec Timed up and go (TUG): 10.88 sec SLS left 10; right 5 DGI 1. Gait level surface (3) Normal: Walks 20', no assistive devices, good  sped, no evidence for imbalance, normal gait pattern 2. Change in gait speed (2) Mild Impairment: Is able to change speed but demonstrates mild gait deviations, or not gait deviations but unable to achieve a significant change in velocity, or uses an assistive device. 3. Gait with horizontal head turns (2) Mild Impairment: Performs head turns smoothly with slight change in gait velocity, i.e., minor disruption to smooth gait path or uses walking aid. 4. Gait with vertical head turns (2) Mild Impairment: Performs head turns smoothly with slight change in gait velocity, i.e., minor disruption to smooth gait path or uses walking aid. 5. Gait and pivot turn (3) Normal: Pivot turns safely within  3 seconds and stops quickly with no loss of balance. 6. Step over obstacle (3) Normal: Is able to step over the box without changing gait speed, no evidence of imbalance. 7. Step around obstacles (3) Normal: Is able to walk around cones safely without changing gait speed; no evidence of imbalance. 8. Stairs (2) Mild Impairment: Alternating feet, must use rail.  TOTAL SCORE: 20 / 24   GAIT: Distance walked: 60 ft in clinic  Assistive device utilized: None Level of assistance: SBA Comments: slight decreased gait speed; no AD                                                                                                                                TREATMENT DATE: 09/08/2024 physical therapy evaluation and HEP instruction    PATIENT EDUCATION:  Education details: Patient educated on exam findings, POC, scope of PT, HEP, and what to expect next visit. Person educated: Patient Education method: Explanation, Demonstration, and Handouts Education comprehension: verbalized understanding, returned demonstration, verbal cues required, and tactile cues required  HOME EXERCISE PROGRAM: Access Code: [email or web address] URL: [email or web address] Date: 09/08/2024 Prepared by: [facility]hab  Exercises - Sit to Stand Without Arm Support  - 2 x daily - 7 x weekly - 1 sets - 10 reps - Standing Single Leg Stance with Counter Support  - 2 x daily - 7 x weekly - 1 sets - 10 reps - 10 sec hold  ASSESSMENT:  CLINICAL IMPRESSION: Patient is a 74 y.o. female who was seen today for physical therapy evaluation and treatment for I63.512 (ICD-10-CM) - Acute ischemic left MCA stroke (HCC).Patient demonstrates decreased strength, balance deficits and gait abnormalities which are negatively impacting patient ability to perform ADLs and functional mobility tasks. Patient will benefit from skilled physical therapy services to address these deficits to improve level of function with ADLs, functional mobility tasks, and reduce risk for falls.    OBJECTIVE IMPAIRMENTS: Abnormal gait, decreased activity tolerance, decreased balance, decreased strength, and pain.   ACTIVITY LIMITATIONS: stairs, dressing, self feeding, and locomotion level  PARTICIPATION LIMITATIONS: meal prep, cleaning, laundry, driving, shopping, and community activity  REHAB POTENTIAL: Good  CLINICAL DECISION MAKING: Evolving/moderate complexity  EVALUATION COMPLEXITY: Moderate   GOALS: Goals reviewed with patient? No  SHORT TERM GOALS: Target date: 09/17/2024 patient will be independent with initial HEP and compliant with HEP 3-4 times a week   Baseline: Goal status: INITIAL  2.  Patient will report 50% improvement overall  Baseline:  Goal status: INITIAL   LONG TERM GOALS: Target date: 09/30/2024  Patient will be independent in self management strategies to improve quality of life and functional outcomes.  Baseline:  Goal status: INITIAL  2.  Patient will report 70% improvement overall  Baseline:  Goal status: INITIAL  3.  Patient will able to stand SLS on each leg x 20 to demonstrate improved functional balance  Baseline: see above Goal status: INITIAL  4.  Patient will increase right leg  MMT's to 5/5 to allow navigation of steps without gait deviation or loss of balance   Baseline:  Goal status: INITIAL  5.  Patient will increase DGI score to 22/24 to demonstrate improved functional gait and mobility Baseline:  Goal status: INITIAL     PLAN:  PT FREQUENCY: 1x/week  PT DURATION: 4 weeks  PLANNED INTERVENTIONS: 97110-Therapeutic exercises, 97530- Therapeutic activity, 97112- Neuromuscular re-education, 97535- Self Care, [id number]- Manual therapy, and Patient/Family education  PLAN FOR NEXT SESSION: Review HEP and goals; higher level balance and gait; strengthening of lower extremities; sees OT on Monday    2:39 PM, 09/08/24 Tieisha Darden Small Yania Bogie MPT [phone] Ph:[phone]   "

## 2024-09-08 ENCOUNTER — Encounter: Admitting: Physical Medicine & Rehabilitation

## 2024-09-08 ENCOUNTER — Other Ambulatory Visit: Payer: Self-pay

## 2024-09-08 ENCOUNTER — Ambulatory Visit (HOSPITAL_COMMUNITY)

## 2024-09-08 DIAGNOSIS — R2689 Other abnormalities of gait and mobility: Secondary | ICD-10-CM | POA: Diagnosis present

## 2024-09-08 DIAGNOSIS — I63312 Cerebral infarction due to thrombosis of left middle cerebral artery: Secondary | ICD-10-CM

## 2024-09-08 DIAGNOSIS — R262 Difficulty in walking, not elsewhere classified: Secondary | ICD-10-CM | POA: Diagnosis present

## 2024-09-08 DIAGNOSIS — I63512 Cerebral infarction due to unspecified occlusion or stenosis of left middle cerebral artery: Secondary | ICD-10-CM

## 2024-09-12 ENCOUNTER — Ambulatory Visit (HOSPITAL_COMMUNITY): Admitting: Occupational Therapy

## 2024-09-13 ENCOUNTER — Ambulatory Visit: Admitting: Physical Therapy

## 2024-09-14 ENCOUNTER — Ambulatory Visit

## 2024-09-14 DIAGNOSIS — I639 Cerebral infarction, unspecified: Secondary | ICD-10-CM

## 2024-09-20 ENCOUNTER — Ambulatory Visit (HOSPITAL_COMMUNITY): Admitting: Occupational Therapy

## 2024-09-22 ENCOUNTER — Ambulatory Visit (HOSPITAL_COMMUNITY): Admitting: Physical Therapy

## 2024-09-23 ENCOUNTER — Encounter: Admitting: Physical Medicine & Rehabilitation

## 2024-09-26 ENCOUNTER — Ambulatory Visit (HOSPITAL_COMMUNITY)

## 2024-10-05 ENCOUNTER — Ambulatory Visit (HOSPITAL_COMMUNITY)

## 2024-10-11 ENCOUNTER — Ambulatory Visit: Admitting: Cardiology

## 2024-10-12 ENCOUNTER — Ambulatory Visit (HOSPITAL_COMMUNITY)

## 2024-11-08 ENCOUNTER — Inpatient Hospital Stay: Admitting: Neurology

## 2024-11-30 ENCOUNTER — Encounter (INDEPENDENT_AMBULATORY_CARE_PROVIDER_SITE_OTHER): Admitting: Ophthalmology
